# Patient Record
Sex: Male | Born: 1960 | Race: White | Hispanic: No | Marital: Single | Smoking: Current every day smoker
Health system: Southern US, Community
[De-identification: ages and names within clinical notes are randomized; demographics above are authoritative.]

## PROBLEM LIST (undated history)

## (undated) DIAGNOSIS — F1910 Other psychoactive substance abuse, uncomplicated: Secondary | ICD-10-CM

## (undated) DIAGNOSIS — Z5900 Homelessness unspecified: Secondary | ICD-10-CM

## (undated) DIAGNOSIS — F1020 Alcohol dependence, uncomplicated: Secondary | ICD-10-CM

## (undated) DIAGNOSIS — Z765 Malingerer [conscious simulation]: Secondary | ICD-10-CM

## (undated) DIAGNOSIS — F419 Anxiety disorder, unspecified: Secondary | ICD-10-CM

## (undated) DIAGNOSIS — F329 Major depressive disorder, single episode, unspecified: Secondary | ICD-10-CM

## (undated) DIAGNOSIS — G629 Polyneuropathy, unspecified: Secondary | ICD-10-CM

## (undated) DIAGNOSIS — F32A Depression, unspecified: Secondary | ICD-10-CM

## (undated) DIAGNOSIS — B1920 Unspecified viral hepatitis C without hepatic coma: Secondary | ICD-10-CM

## (undated) DIAGNOSIS — Z86718 Personal history of other venous thrombosis and embolism: Secondary | ICD-10-CM

## (undated) DIAGNOSIS — J449 Chronic obstructive pulmonary disease, unspecified: Secondary | ICD-10-CM

## (undated) HISTORY — PX: HIP SURGERY: SHX245

## (undated) HISTORY — PX: ABDOMINAL SURGERY: SHX537

---

## 2000-05-18 ENCOUNTER — Emergency Department (HOSPITAL_COMMUNITY): Admission: EM | Admit: 2000-05-18 | Discharge: 2000-05-18 | Payer: Self-pay | Admitting: Emergency Medicine

## 2006-09-29 ENCOUNTER — Emergency Department (HOSPITAL_COMMUNITY): Admission: EM | Admit: 2006-09-29 | Discharge: 2006-09-29 | Payer: Self-pay | Admitting: Emergency Medicine

## 2008-03-30 ENCOUNTER — Inpatient Hospital Stay (HOSPITAL_COMMUNITY): Admission: EM | Admit: 2008-03-30 | Discharge: 2008-04-06 | Payer: Self-pay | Admitting: Internal Medicine

## 2008-03-30 ENCOUNTER — Encounter: Payer: Self-pay | Admitting: Emergency Medicine

## 2008-05-18 ENCOUNTER — Emergency Department (HOSPITAL_BASED_OUTPATIENT_CLINIC_OR_DEPARTMENT_OTHER): Admission: EM | Admit: 2008-05-18 | Discharge: 2008-05-19 | Payer: Self-pay | Admitting: Emergency Medicine

## 2008-06-22 ENCOUNTER — Emergency Department (HOSPITAL_COMMUNITY): Admission: EM | Admit: 2008-06-22 | Discharge: 2008-06-22 | Payer: Self-pay | Admitting: Emergency Medicine

## 2008-09-01 IMAGING — CR DG HIP COMPLETE 2+V*R*
3 series · 3 of 3 positions shown · non-contrast
Comparison: None

CLINICAL DATA: Some onset right hip and knee pain

RIGHT HIP - COMPLETE 2+ VIEW

[t pelvis a.p.]
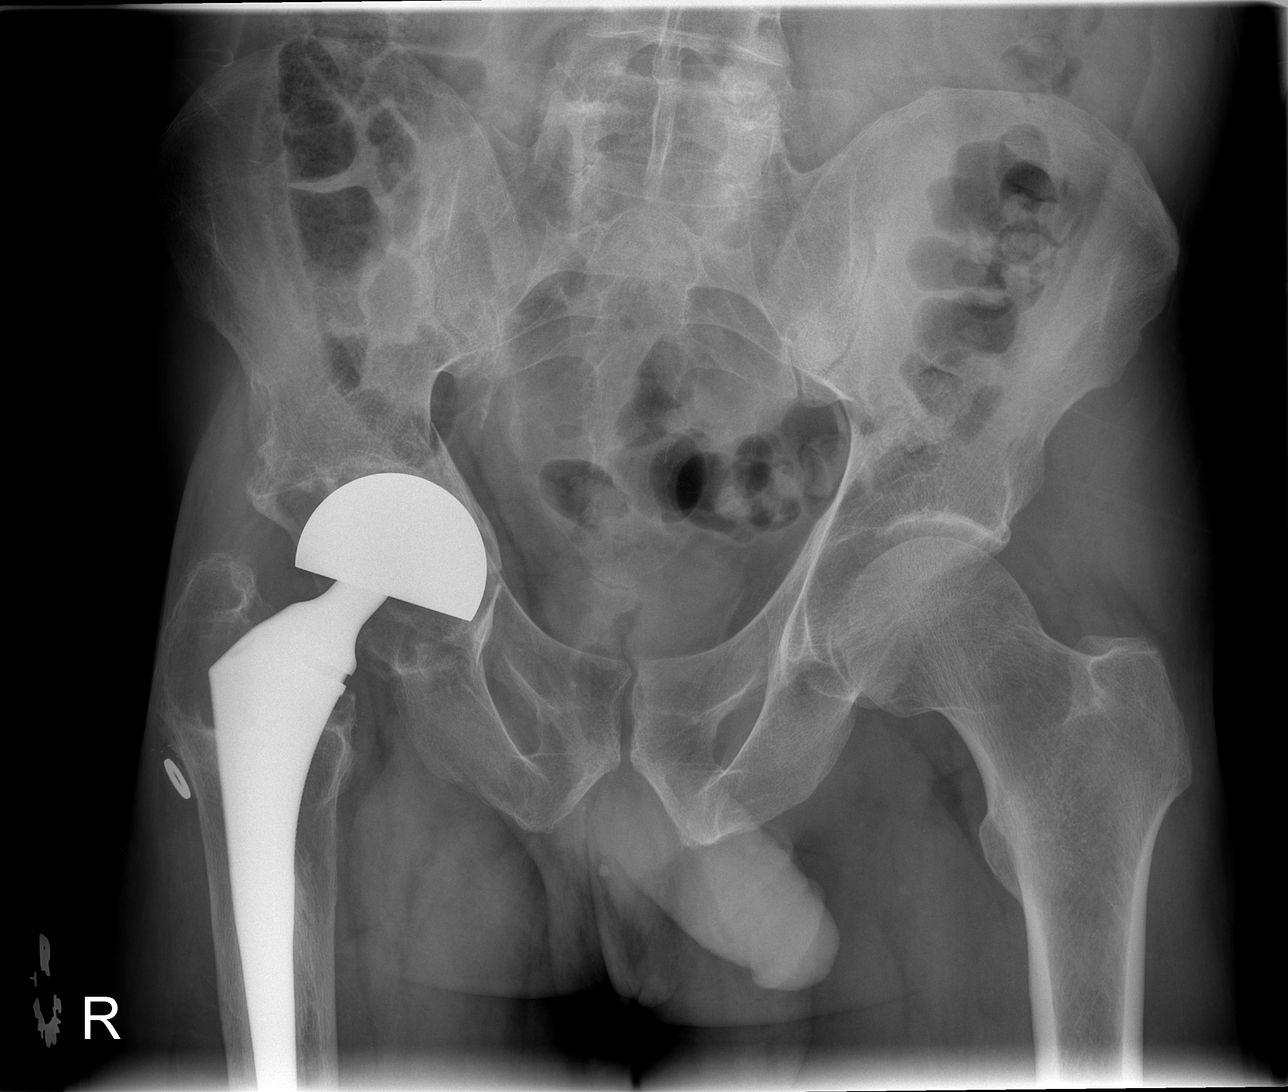

[t hip ap right]
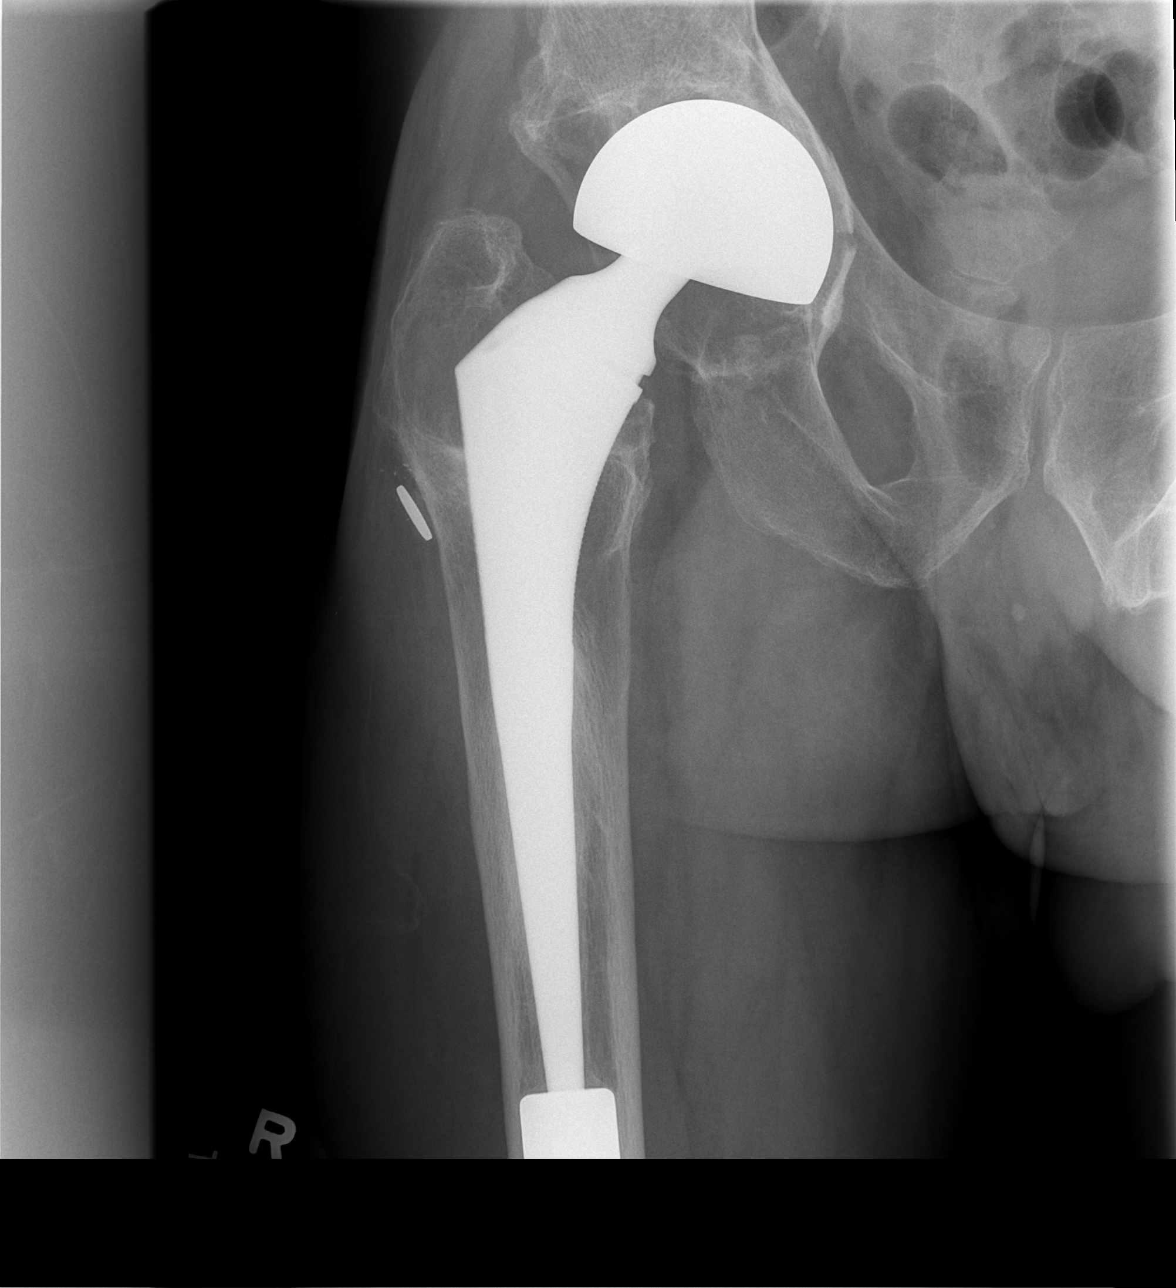

[t hip frog leg right]
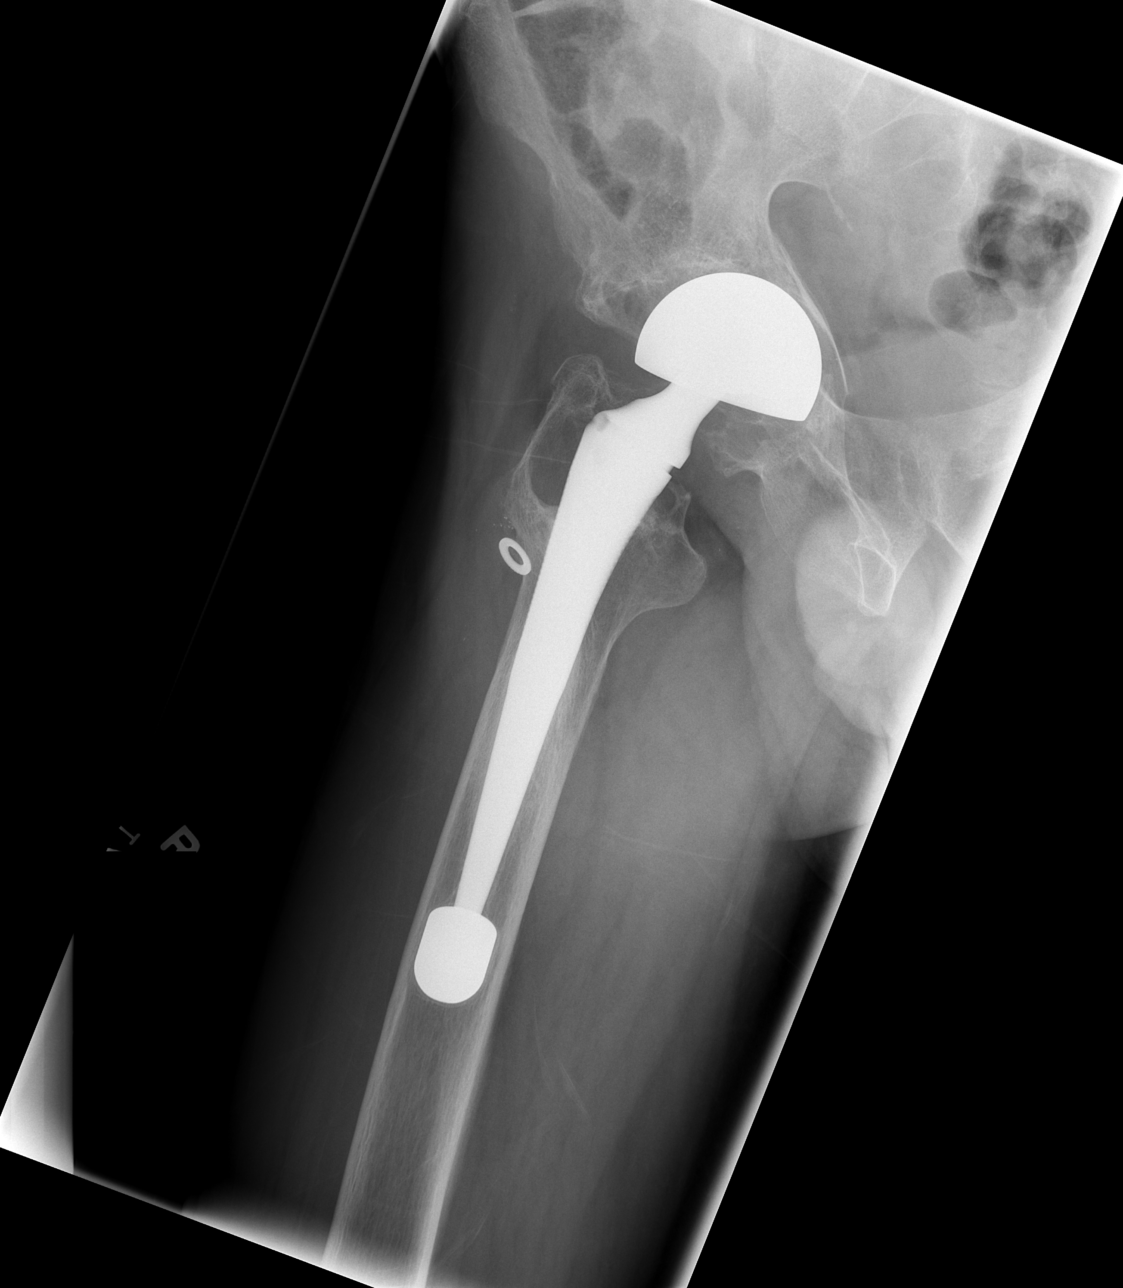

[3 of 3 positions shown; findings below may reference images not displayed]

FINDINGS: There is extensive lucency about the femoral component.
The features are consistent with a foreign body histiocytic
reaction.  Additionally, lucency is seen along the acetabular
component.  There is cortical destruction along the medial
acetabulum.

The remainder of the the bony pelvis is intact
IMPRESSION: 1.  Features of loosening of the right hip prosthesis are noted.
Changes associated with a foreign body histiocytic reaction are
identified.  There is cortical disruption along the medial
acetabular wall which is suspicious for fracture.

## 2011-01-30 NOTE — H&P (Signed)
[name], [name]                ACCOUNT NO.:  [id number]   MEDICAL RECORD NO.:  [id number]          PATIENT TYPE:  INP   LOCATION:  1440                         FACILITY:  [facility]   PHYSICIAN:  Della Goo, M.D. DATE OF BIRTH:  1960-12-21   DATE OF ADMISSION:  03/30/2008  DATE OF DISCHARGE:                              HISTORY & PHYSICAL   PRIMARY CARE PHYSICIAN:  Unassigned.   CHIEF COMPLAINT:  Pulmonary Embolism   HISTORY OF PRESENT ILLNESS:  This is a 50 year old male who was seen in  the emergency department at the [facility] site following being  hospitalized at [facility] for a DVT of the right lower extremity  and a pulmonary embolism where he also had an IVC filter placed one day  ago.  The patient reports leaving against medical advice, and he states  he left because he was not being treated well at [facility].  He reported to the [facility] emergency department site of [facility] for evaluation.  He also has a right knee fracture which was  suffered 2 weeks ago and has been in a right lower extremity knee  immobilizer.  The patient also has a long history of alcohol abuse and  reports wanting to quit drinking.  He reports drinking over a 12-pack of  beer daily for many years.  He reports after leaving [facility], he had a few beers.  When he was seen in the emergency  department in [facility], he was found to have an alcohol level of 25.  The patient also reports being homeless and stays around in the area of  [place], which is why he states he went to [facility].  He reports not having any medical records in the [facility] systems  prior to this.   PAST MEDICAL HISTORY:  Mentioned above.   MEDICATIONS:  None.   ALLERGIES:  NONSTEROIDAL ANTI-INFLAMMATORIES AND PROTONIX.   SOCIAL HISTORY:  The patient is a smoker.  He reports smoking one pack a  day since age 62.  He also is a drinker and the drinking  history as  mentioned above.  He denies any illicit drug usage.   FAMILY HISTORY:  Noncontributory.   REVIEW OF SYSTEMS:  Negative.   PHYSICAL EXAMINATION FINDINGS:  GENERAL:  This is a 50 year old thin,  well-developed male in no discomfort or acute distress.  VITAL SIGNS:  Stable.  He is afebrile.  HEENT:  Normocephalic, atraumatic.  There is no scleral icterus.  Pupils  are equally round reactive to light.  Extraocular muscles are intact.  Oropharynx clear.  NECK:  Supple full range of motion.  No thyromegaly, adenopathy, jugular  venous distention.  CARDIOVASCULAR:  Regular rate and rhythm.  No murmurs, gallops or rubs.  LUNGS:  Clear to auscultation bilaterally.  ABDOMEN:  Positive bowel sounds, soft, nontender, nondistended.  EXTREMITIES:  Without cyanosis, clubbing or edema.  There are  excoriations of the right lower extremity along the lateral aspect of  the right lower extremity.  There is no cyanosis, clubbing or edema  except at the patellar area.  Dorsal pedal pulses are intact  bilaterally.  NEUROLOGIC:  Examination nonfocal.   ASSESSMENT:  A 50 year old male being admitted with:  1. Pulmonary embolism and DVT of the right lower extremity.  2. Alcohol abuse.  3. Right knee fracture.  4. Tobacco abuse.   PLAN:  The patient will be admitted to telemetry area for cardiac  monitoring.  The Coumadin protocol will be initiated and full-dose  Lovenox therapy will be ordered.  The IV Ativan withdrawal protocol for  alcohol withdrawal has also been ordered, and a repeat x-ray of the  right lower extremity will be performed in the a.m.  The patient's  medical records will be requested from [facility].      Della Goo, M.D.  Electronically Signed     HJ/MEDQ  D:  03/31/2008  T:  03/31/2008  Job:  [id number]

## 2011-02-02 NOTE — Discharge Summary (Signed)
NAMEBRENNYN, [name]                ACCOUNT NO.:  [id number]   MEDICAL RECORD NO.:  [id number]          PATIENT TYPE:  INP   LOCATION:  1440                         FACILITY:  [facility]   PHYSICIAN:  Lonia Blood, M.D.      DATE OF BIRTH:  1961/03/07   DATE OF ADMISSION:  03/30/2008  DATE OF DISCHARGE:  04/06/2008                               DISCHARGE SUMMARY   PRIMARY CARE PHYSICIAN:  The patient was unassigned to [place] from [facility], in [facility], that is where he gets his care normally.   DISCHARGE DIAGNOSES:  1. Pulmonary embolus and left lower extremity deep vein thrombosis.  2. Right knee fracture.  3. Polysubstance abuse.  4. Status post inferior vena cava filter placement.   DISCHARGE MEDICATIONS:  1. Coumadin to be titrated as an outpatient.  2. Thiamine 100 mg daily.  3. Multivitamins daily.  4. Ibuprofen 500 mg t.i.d.  5. Abilify 20 mg daily.  6. Celexa 10 mg daily.  7. Trazodone 200 mg nightly.  8. Vistaril 25 mg daily.   DISPOSITION:  The patient was discharged with help to go home.  He is  unable to buy his medications and he received help from the hospital.  He has been advised to follow-up in with [facility] or return to [facility] where he originally gets his care.   PROCEDURES PERFORMED:  Right knee x-ray performed on March 30, 2008,  showed comminuted and mildly depressed lateral tibial plateau fracture,  joint effusions with suggestions of hemarthrosis.   CONSULTATIONS:  None.   BRIEF HISTORY AND PHYSICAL:  Please refer to dictated history and  physical on admission by Dr. Della Goo.  In short, however,  patient was a 50 year old gentleman that typically gets his care at [facility] as well as [facility].  He was hospitalized at [facility] for DVT of the  right lower extremity and pulmonary embolism.  An IVC filter was placed  a earlier and patient was still under treatment when he left against  medical advice.  He came to the hospital  here asking for a second  opinion.  The patient had a right knee fracture that was also under  treatment for 2 weeks prior to having his DVT and PE.  He was  subsequently admitted here for further management.   HOSPITAL COURSE:  1. PE, DVT.  The patient was started on heparin and Coumadin in      addition to his IVC filter.  He was counseled extensively and he      accepted treatment here with the same treatment plan.  He was      discharged when his INR was therapeutic between 2 and 3.  2. Right knee fracture.  The patient received PT, OT and orthopedics      was consulted here.  Main treatment is to continue what he is doing      with crutches and very little weightbearing at this point.  3. Alcohol abuse.  The patient was empirically started on alcohol      withdrawal protocol and he  did very well in the hospital.  4. Hypokalemia.  This was transient and treated accordingly.  5. Social issues.  The patient was homeless, apparently, and was sent      to the shelter with assistance both with medication as well as      transportation at the time of discharge.      Lonia Blood, M.D.  Electronically Signed     LG/MEDQ  D:  04/25/2008  T:  04/25/2008  Job:  [id number]

## 2011-05-27 ENCOUNTER — Other Ambulatory Visit: Payer: Self-pay

## 2011-05-27 ENCOUNTER — Encounter: Payer: Self-pay | Admitting: *Deleted

## 2011-05-27 ENCOUNTER — Emergency Department (HOSPITAL_BASED_OUTPATIENT_CLINIC_OR_DEPARTMENT_OTHER)
Admission: EM | Admit: 2011-05-27 | Discharge: 2011-05-28 | Disposition: A | Discharge status: Rehabilitation Facility | Payer: Self-pay | Attending: Emergency Medicine | Admitting: Emergency Medicine

## 2011-05-27 DIAGNOSIS — F17200 Nicotine dependence, unspecified, uncomplicated: Secondary | ICD-10-CM | POA: Insufficient documentation

## 2011-05-27 DIAGNOSIS — Z5900 Homelessness unspecified: Secondary | ICD-10-CM | POA: Insufficient documentation

## 2011-05-27 DIAGNOSIS — F1020 Alcohol dependence, uncomplicated: Secondary | ICD-10-CM

## 2011-05-27 DIAGNOSIS — F10239 Alcohol dependence with withdrawal, unspecified: Secondary | ICD-10-CM

## 2011-05-27 DIAGNOSIS — F319 Bipolar disorder, unspecified: Secondary | ICD-10-CM | POA: Insufficient documentation

## 2011-05-27 DIAGNOSIS — R110 Nausea: Secondary | ICD-10-CM | POA: Insufficient documentation

## 2011-05-27 DIAGNOSIS — F10939 Alcohol use, unspecified with withdrawal, unspecified: Secondary | ICD-10-CM | POA: Insufficient documentation

## 2011-05-27 HISTORY — DX: Alcohol dependence, uncomplicated: F10.20

## 2011-05-27 HISTORY — DX: Polyneuropathy, unspecified: G62.9

## 2011-05-27 LAB — COMPREHENSIVE METABOLIC PANEL
ALT: 16 U/L (ref 0–53)
Alkaline Phosphatase: 102 U/L (ref 39–117)
BUN: 8 mg/dL (ref 6–23)
CO2: 28 mEq/L (ref 19–32)
Chloride: 98 mEq/L (ref 96–112)
Creatinine, Ser: 0.5 mg/dL (ref 0.50–1.35)
GFR calc Af Amer: >60 mL/min (ref >60)
GFR calc non Af Amer: >60 mL/min (ref >60)
Glucose, Bld: 127 mg/dL — ABNORMAL HIGH (ref 70–99)
Sodium: 136 mEq/L (ref 135–145)
Total Bilirubin: 0.7 mg/dL (ref 0.3–1.2)

## 2011-05-27 LAB — CBC
HCT: 38 % — ABNORMAL LOW (ref 39.0–52.0)
Hemoglobin: 13.2 g/dL (ref 13.0–17.0)
MCH: 31.1 pg (ref 26.0–34.0)
MCHC: 34.7 g/dL (ref 30.0–36.0)
MCV: 89.4 fL (ref 78.0–100.0)
Platelets: 206 10*3/uL (ref 150–400)
RDW: 15.3 % (ref 11.5–15.5)
WBC: 7 10*3/uL (ref 4.0–10.5)

## 2011-05-27 LAB — RAPID URINE DRUG SCREEN, HOSP PERFORMED
Cocaine: NOT DETECTED
Tetrahydrocannabinol: POSITIVE — AB

## 2011-05-27 LAB — ETHANOL: Alcohol, Ethyl (B): <11 mg/dL (ref 0–11)

## 2011-05-27 MED ORDER — LORAZEPAM 2 MG/ML IJ SOLN: 1.0000 mg | Freq: Once | INTRAMUSCULAR | Status: DC

## 2011-05-27 MED ORDER — ONDANSETRON HCL 8 MG PO TABS
8.0000 mg | ORAL_TABLET | Freq: Once | ORAL | Status: AC
  Administered 2011-05-27: 8 mg via ORAL
  Filled 2011-05-27: qty 1

## 2011-05-27 MED ORDER — LORAZEPAM 1 MG PO TABS
2.0000 mg | ORAL_TABLET | Freq: Once | ORAL | Status: AC
  Administered 2011-05-27: 2 mg via ORAL
  Filled 2011-05-27: qty 2

## 2011-05-27 MED ORDER — SODIUM CHLORIDE 0.9 % IV BOLUS (SEPSIS)
1000.0000 mL | Freq: Once | INTRAVENOUS | Status: AC
  Administered 2011-05-27: 1000 mL via INTRAVENOUS

## 2011-05-27 MED ORDER — LORAZEPAM 2 MG/ML IJ SOLN
1.0000 mg | Freq: Once | INTRAMUSCULAR | Status: AC
  Administered 2011-05-27: 1 mg via INTRAVENOUS
  Filled 2011-05-27: qty 1

## 2011-05-27 MED ORDER — THIAMINE HCL 100 MG/ML IJ SOLN
Freq: Once | INTRAVENOUS | Status: DC
  Filled 2011-05-27: qty 1000

## 2011-05-27 NOTE — ED Notes (Addendum)
[facility] called, Asher Muir, will arrive at appox 1130pm

## 2011-05-27 NOTE — ED Notes (Signed)
Called [facility]

## 2011-05-27 NOTE — ED Provider Notes (Signed)
Scribed for Forbes Cellar, MD, the patient was seen in room MH08/MH08 . This chart was scribed by Ellie Lunch. This patient's care was started at 9:16 PM.   CSN: [id number] Arrival date & time: 05/27/2011  8:03 PM  Chief Complaint  Patient presents with  . Alcohol Problem   HPI [name] [name] is a 50 y.o. male who presents to the Emergency Department complaining of alcoholism. Pt reports a 40 year history of alcohol abuse. Pt drinks up to 18-20 beers a day and/or 2 pints of liquor. Pt reports last drink was yesterday at 3:00 PM. C/o HA, Nausea, diaphoresis and tremors. Denies SI/HI.  Says he has been hearing music that he does not believe is real since his last drink. Reports he has had seizures in the past from alcohol use. Received inpatient treatment at [facility] at [facility] 2-3 months ago. Reports it was unsuccessful. Pt says he is homeless at this time.   Past Medical History  Diagnosis Date  . Alcoholic   . Peripheral neuropathy   artificial right hip Bipolar disorder  Past Surgical History  Procedure Date  . Hip surgery    MEDICATIONS:  Previous Medications   No medications on file  Reports he has Rx for lexiprin and valium, but has not taken either.  ALLERGIES:  Allergies as of 05/27/2011 - Review Complete 05/27/2011  Allergen Reaction Noted  . Nsaids Nausea And Vomiting 05/27/2011  . Protonix Rash 05/27/2011      History reviewed. No pertinent family history.  History  Substance Use Topics  . Smoking status: Current Everyday Smoker  . Smokeless tobacco: Not on file  . Alcohol Use: 0.0 oz/week      Review of Systems .10 Systems reviewed and are negative except as noted in the HPI.   Physical Exam  BP 117/69  Pulse 89  Temp(Src) 98 F (36.7 C) (Oral)  Resp 16  Ht 6\' 1"  (1.854 m)  Wt 150 lb (68.04 kg)  BMI 19.79 kg/m2  SpO2 100%  Physical Exam  Nursing note and vitals reviewed. Constitutional: He is oriented to person, place, and time. He appears  well-developed and well-nourished.  HENT:  Head: Normocephalic and atraumatic.  Eyes: EOM are normal. No scleral icterus.  Neck: Neck supple.  Cardiovascular: Regular rhythm and normal heart sounds.        tachycardic  Pulmonary/Chest: Effort normal and breath sounds normal.  Abdominal: Soft. There is no tenderness.  Musculoskeletal: Normal range of motion.  Neurological: He is alert and oriented to person, place, and time. No cranial nerve deficit. Coordination normal.       tremor at rest Strength 5/5 all extremities No pronator drift No facial droop   Skin: Skin is warm and dry.  Psychiatric: He has a normal mood and affect.   Procedures  OTHER DATA REVIEWED: Nursing notes, vital signs, and past medical records reviewed.  DIAGNOSTIC STUDIES: Oxygen Saturation is 94% on room air, adequate by my interpretation.     Date: 05/27/2011  Rate: 110  Rhythm: sinus tachycardia  QRS Axis: normal  Intervals: normal  ST/T Wave abnormalities: normal  Conduction Disutrbances:none  Narrative Interpretation:   Old EKG Reviewed: changes noted   LABS / RADIOLOGY:  Results for orders placed during the hospital encounter of 05/27/11  CBC      Component Value Range   WBC 7.0  4.0 - 10.5 (K/uL)   RBC 4.25  4.22 - 5.81 (MIL/uL)   Hemoglobin 13.2  13.0 - 17.0 (g/dL)  HCT 38.0 (*) 39.0 - 52.0 (%)   MCV 89.4  78.0 - 100.0 (fL)   MCH 31.1  26.0 - 34.0 (pg)   MCHC 34.7  30.0 - 36.0 (g/dL)   RDW 16.1  09.6 - 04.5 (%)   Platelets 206  150 - 400 (K/uL)  COMPREHENSIVE METABOLIC PANEL      Component Value Range   Sodium 136  135 - 145 (mEq/L)   Potassium 3.6  3.5 - 5.1 (mEq/L)   Chloride 98  96 - 112 (mEq/L)   CO2 28  19 - 32 (mEq/L)   Glucose, Bld 127 (*) 70 - 99 (mg/dL)   BUN 8  6 - 23 (mg/dL)   Creatinine, Ser 4.09  0.50 - 1.35 (mg/dL)   Calcium 9.6  8.4 - 81.1 (mg/dL)   Total Protein 7.2  6.0 - 8.3 (g/dL)   Albumin 3.4 (*) 3.5 - 5.2 (g/dL)   AST 34  0 - 37 (U/L)   ALT 16  0 - 53  (U/L)   Alkaline Phosphatase 102  39 - 117 (U/L)   Total Bilirubin 0.7  0.3 - 1.2 (mg/dL)   GFR calc non Af Amer >60  >60 (mL/min)   GFR calc Af Amer >60  >60 (mL/min)  ETHANOL      Component Value Range   Alcohol, Ethyl (B) <11  0 - 11 (mg/dL)  ACETAMINOPHEN LEVEL      Component Value Range   Acetaminophen (Tylenol), Serum <15.0  10 - 30 (ug/mL)  URINE RAPID DRUG SCREEN (HOSP PERFORMED)      Component Value Range   Opiates NONE DETECTED  NONE DETECTED    Cocaine NONE DETECTED  NONE DETECTED    Benzodiazepines POSITIVE (*) NONE DETECTED    Amphetamines NONE DETECTED  NONE DETECTED    Tetrahydrocannabinol POSITIVE (*) NONE DETECTED    Barbiturates NONE DETECTED  NONE DETECTED     MDM: 50yoM h/o etoh abuse pw request for etoh detox. Withdrawing here. Check labs. IVF, ativan. Needs eval for etoh w/d   21:16 EDP at Pt bedside discusses treatment and orders. PT reports improvement with Ativan.   1:50 AM  D/W [facility]. Working on placement. Pt resting comfortably at this time.   BP 117/69  Pulse 89  Temp(Src) 98 F (36.7 C) (Oral)  Resp 16  Ht 6\' 1"  (1.854 m)  Wt 150 lb (68.04 kg)  BMI 19.79 kg/m2  SpO2 100%  Pt signed out to Dr. Denton Lank  IMPRESSION: Diagnoses that have been ruled out:  Diagnoses that are still under consideration:  Final diagnoses:    MEDICATIONS GIVEN IN THE E.D.  Medications  LORazepam (ATIVAN) tablet 2 mg (2 mg Oral Given 05/27/11 2053)  ondansetron (ZOFRAN) tablet 8 mg (8 mg Oral Given 05/27/11 2053)  sodium chloride 0.9 % bolus 1,000 mL (1000 mL Intravenous Given 05/27/11 2124)  LORazepam (ATIVAN) injection 1 mg (1 mg Intravenous Given 05/27/11 2130)   SCRIBE ATTESTATION: I personally performed the services described in this documentation, which was scribed in my presence. The recorded information has been reviewed and considered. Forbes Cellar, MD          Forbes Cellar, MD 05/28/11 [phone]

## 2011-05-27 NOTE — ED Notes (Signed)
Jamie with [facility] has arrived and is at the patient's bedside.

## 2011-05-27 NOTE — ED Notes (Signed)
Pt states he is an alcoholic and is trying to get help. Last drank yesterday at 3p. Also c/o H/A, nausea "kind of like I have the flu" Shaky

## 2011-05-28 MED ORDER — THIAMINE HCL 100 MG/ML IJ SOLN: 100.0000 mg | Freq: Every day | INTRAMUSCULAR | Status: DC

## 2011-05-28 MED ORDER — LORAZEPAM 2 MG/ML IJ SOLN
1.0000 mg | Freq: Four times a day (QID) | INTRAMUSCULAR | Status: DC | PRN
  Administered 2011-05-28: 1 mg via INTRAVENOUS
  Filled 2011-05-28: qty 1

## 2011-05-28 MED ORDER — THERA M PLUS PO TABS: 1.0000 | ORAL_TABLET | Freq: Every day | ORAL | Status: DC

## 2011-05-28 MED ORDER — LORAZEPAM 1 MG PO TABS: 1.0000 mg | ORAL_TABLET | Freq: Four times a day (QID) | ORAL | Status: DC | PRN

## 2011-05-28 MED ORDER — LORAZEPAM 1 MG PO TABS
1.0000 mg | ORAL_TABLET | Freq: Once | ORAL | Status: AC
  Administered 2011-05-28: 1 mg via ORAL
  Filled 2011-05-28: qty 1

## 2011-05-28 MED ORDER — VITAMIN B-1 100 MG PO TABS: 100.0000 mg | ORAL_TABLET | Freq: Every day | ORAL | Status: DC

## 2011-05-28 MED ORDER — FOLIC ACID 1 MG PO TABS: 1.0000 mg | ORAL_TABLET | Freq: Every day | ORAL | Status: DC

## 2011-05-28 NOTE — ED Notes (Addendum)
Per Asher Muir with [facility], pt has been accepted to [facility] in [place], provider notified and pt to be taken by [facility] in the morning about 0800am or shortly after.

## 2011-05-28 NOTE — ED Notes (Signed)
Pt d/c amb with mobile crisis worker, to be transported to [facility] in [place] for inpatient detox.  Pt with minimal tremors noted to hands, gait steady and strong, speech clear, denies any c/o.

## 2011-05-28 NOTE — ED Provider Notes (Signed)
  Physical Exam  BP 136/89  Pulse 91  Temp(Src) 97.4 F (36.3 C) (Oral)  Resp 16  Ht 6\' 1"  (1.854 m)  Wt 150 lb (68.04 kg)  BMI 19.79 kg/m2  SpO2 98%  Physical Exam  ED Course  Procedures  MDM  Pt signed out that was awaiting beh health placement.  Evaluating service indicates pt has been accepted to [facility] for treatment and that they will pick up pt later this morning and transfer there. Pt resting quietly, no shakes, no tachycardia. Will sign out to morning md to recheck prior to dispo.       Suzi Roots, MD 05/28/11 [phone]

## 2011-06-14 LAB — URINALYSIS, ROUTINE W REFLEX MICROSCOPIC
Bilirubin Urine: NEGATIVE
Glucose, UA: NEGATIVE
Hgb urine dipstick: NEGATIVE
Ketones, ur: NEGATIVE
Protein, ur: NEGATIVE
Urobilinogen, UA: 0.2

## 2011-06-14 LAB — ACETAMINOPHEN LEVEL: Acetaminophen (Tylenol), Serum: <10 — ABNORMAL LOW

## 2011-06-14 LAB — POCT TOXICOLOGY PANEL
Benzodiazepines: POSITIVE
Opiates: POSITIVE

## 2011-06-14 LAB — CBC
HCT: 42.7
Hemoglobin: 14.5
Platelets: 362
WBC: 9.7

## 2011-06-14 LAB — DIFFERENTIAL
Basophils Absolute: 0.2 — ABNORMAL HIGH
Basophils Relative: 2 — ABNORMAL HIGH
Eosinophils Absolute: 0
Monocytes Absolute: 0.4
Neutro Abs: 7.8 — ABNORMAL HIGH

## 2011-06-14 LAB — COMPREHENSIVE METABOLIC PANEL
ALT: 27
Albumin: 4.8
Alkaline Phosphatase: 93
BUN: 8
Chloride: 101
Glucose, Bld: 75
Potassium: 4.8
Sodium: 140
Total Bilirubin: 0.6

## 2011-06-14 LAB — ETHANOL: Alcohol, Ethyl (B): 20 — ABNORMAL HIGH

## 2011-06-15 LAB — HEPATIC FUNCTION PANEL
ALT: 29
Albumin: 3.1 — ABNORMAL LOW
Alkaline Phosphatase: 74
Indirect Bilirubin: 0.8
Total Protein: 6.2

## 2011-06-15 LAB — PROTIME-INR
INR: 1.1
INR: 1.6 — ABNORMAL HIGH
INR: 2.5 — ABNORMAL HIGH
INR: 2.5 — ABNORMAL HIGH
Prothrombin Time: 19.9 — ABNORMAL HIGH
Prothrombin Time: 29.7 — ABNORMAL HIGH
Prothrombin Time: 33.9 — ABNORMAL HIGH

## 2011-06-15 LAB — TSH: TSH: 1.796

## 2011-06-15 LAB — BASIC METABOLIC PANEL
BUN: 9
Calcium: 9
Creatinine, Ser: 0.8
GFR calc Af Amer: >60

## 2011-06-15 LAB — CBC
MCV: 92.5
Platelets: 286
RBC: 4.15 — ABNORMAL LOW
WBC: 5

## 2011-06-19 LAB — DIFFERENTIAL
Lymphocytes Relative: 17
Lymphs Abs: 1.5
Monocytes Relative: 5
Neutrophils Relative %: 77

## 2011-06-19 LAB — URINALYSIS, ROUTINE W REFLEX MICROSCOPIC
Glucose, UA: NEGATIVE
Specific Gravity, Urine: 1.025
Urobilinogen, UA: 1
pH: 5.5

## 2011-06-19 LAB — POCT I-STAT, CHEM 8
BUN: 8
Chloride: 103
Creatinine, Ser: 0.8
Glucose, Bld: 87
Potassium: 3.6
Sodium: 134 — ABNORMAL LOW

## 2011-06-19 LAB — GC/CHLAMYDIA PROBE AMP, URINE: Chlamydia, Swab/Urine, PCR: NEGATIVE

## 2011-06-19 LAB — RAPID URINE DRUG SCREEN, HOSP PERFORMED
Amphetamines: NOT DETECTED
Barbiturates: NOT DETECTED

## 2011-06-19 LAB — CBC
Platelets: 222
RBC: 4.2 — ABNORMAL LOW
WBC: 8.9

## 2011-06-19 LAB — URINE MICROSCOPIC-ADD ON

## 2011-06-19 LAB — ETHANOL: Alcohol, Ethyl (B): <5

## 2011-06-19 LAB — URINE CULTURE: Colony Count: NO GROWTH

## 2011-06-20 LAB — POCT TOXICOLOGY PANEL

## 2011-06-20 LAB — URINALYSIS, ROUTINE W REFLEX MICROSCOPIC
Hgb urine dipstick: NEGATIVE
Specific Gravity, Urine: 1.005
Urobilinogen, UA: 0.2

## 2011-07-29 ENCOUNTER — Emergency Department (HOSPITAL_COMMUNITY)
Admission: EM | Admit: 2011-07-29 | Discharge: 2011-07-30 | Disposition: A | Discharge status: Home / Self Care | Payer: Self-pay | Attending: Emergency Medicine | Admitting: Emergency Medicine

## 2011-07-29 ENCOUNTER — Encounter (HOSPITAL_COMMUNITY): Payer: Self-pay

## 2011-07-29 DIAGNOSIS — R259 Unspecified abnormal involuntary movements: Secondary | ICD-10-CM | POA: Insufficient documentation

## 2011-07-29 DIAGNOSIS — R5381 Other malaise: Secondary | ICD-10-CM | POA: Insufficient documentation

## 2011-07-29 DIAGNOSIS — F1010 Alcohol abuse, uncomplicated: Secondary | ICD-10-CM | POA: Insufficient documentation

## 2011-07-29 DIAGNOSIS — F411 Generalized anxiety disorder: Secondary | ICD-10-CM | POA: Insufficient documentation

## 2011-07-29 DIAGNOSIS — Z5900 Homelessness unspecified: Secondary | ICD-10-CM | POA: Insufficient documentation

## 2011-07-29 DIAGNOSIS — R5383 Other fatigue: Secondary | ICD-10-CM | POA: Insufficient documentation

## 2011-07-29 DIAGNOSIS — IMO0001 Reserved for inherently not codable concepts without codable children: Secondary | ICD-10-CM | POA: Insufficient documentation

## 2011-07-29 DIAGNOSIS — F329 Major depressive disorder, single episode, unspecified: Secondary | ICD-10-CM | POA: Insufficient documentation

## 2011-07-29 DIAGNOSIS — M549 Dorsalgia, unspecified: Secondary | ICD-10-CM | POA: Insufficient documentation

## 2011-07-29 DIAGNOSIS — F3289 Other specified depressive episodes: Secondary | ICD-10-CM | POA: Insufficient documentation

## 2011-07-29 LAB — RAPID URINE DRUG SCREEN, HOSP PERFORMED: Opiates: NOT DETECTED

## 2011-07-29 LAB — COMPREHENSIVE METABOLIC PANEL
BUN: 12 mg/dL (ref 6–23)
CO2: 25 mEq/L (ref 19–32)
Chloride: 98 mEq/L (ref 96–112)
Creatinine, Ser: 0.6 mg/dL (ref 0.50–1.35)
GFR calc non Af Amer: >90 mL/min (ref >90)
Glucose, Bld: 90 mg/dL (ref 70–99)
Total Bilirubin: 0.3 mg/dL (ref 0.3–1.2)

## 2011-07-29 LAB — ETHANOL: Alcohol, Ethyl (B): <11 mg/dL (ref 0–11)

## 2011-07-29 LAB — CBC
HCT: 39.9 % (ref 39.0–52.0)
Hemoglobin: 13.2 g/dL (ref 13.0–17.0)
MCV: 96.4 fL (ref 78.0–100.0)
RBC: 4.14 MIL/uL — ABNORMAL LOW (ref 4.22–5.81)
WBC: 8.8 10*3/uL (ref 4.0–10.5)

## 2011-07-29 MED ORDER — LORAZEPAM 1 MG PO TABS
1.0000 mg | ORAL_TABLET | Freq: Four times a day (QID) | ORAL | Status: DC | PRN
  Administered 2011-07-30: 2 mg via ORAL
  Administered 2011-07-30: 1 mg via ORAL
  Filled 2011-07-29: qty 2
  Filled 2011-07-29: qty 1

## 2011-07-29 MED ORDER — THERA M PLUS PO TABS
1.0000 | ORAL_TABLET | Freq: Every day | ORAL | Status: DC
  Administered 2011-07-30: 10:00:00 via ORAL
  Filled 2011-07-29: qty 1

## 2011-07-29 MED ORDER — THIAMINE HCL 100 MG/ML IJ SOLN: 100.0000 mg | Freq: Every day | INTRAMUSCULAR | Status: DC

## 2011-07-29 MED ORDER — FOLIC ACID 1 MG PO TABS
1.0000 mg | ORAL_TABLET | Freq: Every day | ORAL | Status: DC
  Administered 2011-07-30: 1 mg via ORAL
  Filled 2011-07-29: qty 1

## 2011-07-29 MED ORDER — LORAZEPAM 2 MG/ML IJ SOLN: 1.0000 mg | Freq: Four times a day (QID) | INTRAMUSCULAR | Status: DC | PRN

## 2011-07-29 MED ORDER — ACETAMINOPHEN 325 MG PO TABS
650.0000 mg | ORAL_TABLET | ORAL | Status: DC | PRN
  Administered 2011-07-30: 650 mg via ORAL
  Filled 2011-07-29: qty 2

## 2011-07-29 MED ORDER — LORAZEPAM 1 MG PO TABS
2.0000 mg | ORAL_TABLET | Freq: Three times a day (TID) | ORAL | Status: DC | PRN
  Administered 2011-07-29 (×2): 2 mg via ORAL
  Filled 2011-07-29 (×2): qty 2

## 2011-07-29 MED ORDER — ONDANSETRON HCL 4 MG PO TABS: 4.0000 mg | ORAL_TABLET | Freq: Three times a day (TID) | ORAL | Status: DC | PRN

## 2011-07-29 MED ORDER — ZOLPIDEM TARTRATE 5 MG PO TABS: 5.0000 mg | ORAL_TABLET | Freq: Every evening | ORAL | Status: DC | PRN

## 2011-07-29 MED ORDER — NICOTINE 21 MG/24HR TD PT24
21.0000 mg | MEDICATED_PATCH | Freq: Every day | TRANSDERMAL | Status: DC
  Administered 2011-07-29 – 2011-07-30 (×2): 21 mg via TRANSDERMAL
  Filled 2011-07-29 (×2): qty 1

## 2011-07-29 MED ORDER — VITAMIN B-1 100 MG PO TABS
100.0000 mg | ORAL_TABLET | Freq: Every day | ORAL | Status: DC
  Administered 2011-07-30: 100 mg via ORAL
  Filled 2011-07-29: qty 1

## 2011-07-29 NOTE — ED Notes (Signed)
Pt admitted to psych ED requesting detox. States he drank two fifths of liquor yesterday and a pint of beer. He denies SI/HI or A/V H. Appearance is disheveled. Has tremors to extremities. Will call telepsych per MD order.

## 2011-07-29 NOTE — ED Notes (Signed)
Report called to wendy states pt can go to room 36

## 2011-07-29 NOTE — ED Provider Notes (Signed)
Patient requesting help with alcohol he denies suicidal ideation presently he is homeless exam cooperative tremulous [place] coma score 15  Doug Sou, MD 07/29/11 2126

## 2011-07-29 NOTE — ED Provider Notes (Signed)
History     CSN: [id number] Arrival date & time: 07/29/2011  3:41 PM   First MD Initiated Contact with Patient 07/29/11 1658      Chief Complaint  Patient presents with  . V70.1    pt in by ems with etoh and s/i with a plan to jump in fromt of car     (Consider location/radiation/quality/duration/timing/severity/associated sxs/prior treatment) Patient is a 50 y.o. male presenting with alcohol problem. The history is provided by the patient.  Alcohol Problem This is a chronic problem. The current episode started more than 1 year ago. The problem occurs constantly. The problem has been gradually worsening. Associated symptoms include joint swelling, myalgias and weakness. Pertinent negatives include no abdominal pain, chest pain, chills, fever, nausea, rash or vomiting.  Pt reports alcohol problem, drinks daily, as much as he can. One week ago got out of detox but relapsed. States today had thoughts about hurting himself, states thought about jumping off the bridge. States he is homeless, has chronic pain, and his life is "out of control." States has history of DTs. Last drink yesterday.  Past Medical History  Diagnosis Date  . Alcoholic   . Peripheral neuropathy     Past Surgical History  Procedure Date  . Hip surgery     History reviewed. No pertinent family history.  History  Substance Use Topics  . Smoking status: Current Everyday Smoker  . Smokeless tobacco: Current User  . Alcohol Use: 0.0 oz/week      Review of Systems  Constitutional: Negative for fever and chills.  HENT: Negative.   Respiratory: Negative.   Cardiovascular: Negative for chest pain.  Gastrointestinal: Negative for nausea, vomiting and abdominal pain.  Genitourinary: Negative.   Musculoskeletal: Positive for myalgias, back pain and joint swelling.  Skin: Negative.  Negative for rash.  Neurological: Positive for weakness.  Psychiatric/Behavioral: Positive for suicidal ideas.       Depression      Allergies  Nsaids and Protonix  Home Medications   Current Outpatient Rx  Name Route Sig Dispense Refill  . DIVALPROEX SODIUM 500 MG PO TB24 Oral Take 500 mg by mouth daily.      Marland Kitchen GABAPENTIN 100 MG PO CAPS Oral Take 1,400 mg by mouth daily.        BP 135/84  Pulse 89  Temp(Src) 98.4 F (36.9 C) (Oral)  Resp 18  SpO2 99%  Physical Exam  Constitutional: He is oriented to person, place, and time. Vital signs are normal. He appears well-developed and well-nourished.       desheveled  Eyes: Conjunctivae are normal. Pupils are equal, round, and reactive to light.  Neck: Neck supple.  Cardiovascular: Normal rate, regular rhythm and normal heart sounds.   Pulmonary/Chest: Effort normal and breath sounds normal. No respiratory distress.  Abdominal: Soft. Bowel sounds are normal. There is no tenderness.  Musculoskeletal: Normal range of motion.  Neurological: He is alert and oriented to person, place, and time. He has normal reflexes. Coordination normal.       Resting tremmor  Skin: Skin is warm and dry.  Psychiatric:       Anxious, flat affect, depressed, suicidal ideations    ED Course  Procedures (including critical care time)  Pt medically cleared.   Results for orders placed during the hospital encounter of 07/29/11  CBC      Component Value Range   WBC 8.8  4.0 - 10.5 (K/uL)   RBC 4.14 (*) 4.22 - 5.81 (MIL/uL)  Hemoglobin 13.2  13.0 - 17.0 (g/dL)   HCT 16.1  09.6 - 04.5 (%)   MCV 96.4  78.0 - 100.0 (fL)   MCH 31.9  26.0 - 34.0 (pg)   MCHC 33.1  30.0 - 36.0 (g/dL)   RDW 40.9  81.1 - 91.4 (%)   Platelets 247  150 - 400 (K/uL)  COMPREHENSIVE METABOLIC PANEL      Component Value Range   Sodium 135  135 - 145 (mEq/L)   Potassium 3.5  3.5 - 5.1 (mEq/L)   Chloride 98  96 - 112 (mEq/L)   CO2 25  19 - 32 (mEq/L)   Glucose, Bld 90  70 - 99 (mg/dL)   BUN 12  6 - 23 (mg/dL)   Creatinine, Ser 7.82  0.50 - 1.35 (mg/dL)   Calcium 9.2  8.4 - 95.6 (mg/dL)   Total  Protein 7.5  6.0 - 8.3 (g/dL)   Albumin 3.7  3.5 - 5.2 (g/dL)   AST 28  0 - 37 (U/L)   ALT 18  0 - 53 (U/L)   Alkaline Phosphatase 104  39 - 117 (U/L)   Total Bilirubin 0.3  0.3 - 1.2 (mg/dL)   GFR calc non Af Amer >90  >90 (mL/min)   GFR calc Af Amer >90  >90 (mL/min)  ETHANOL      Component Value Range   Alcohol, Ethyl (B) <11  0 - 11 (mg/dL)  URINE RAPID DRUG SCREEN (HOSP PERFORMED)      Component Value Range   Opiates NONE DETECTED  NONE DETECTED    Cocaine NONE DETECTED  NONE DETECTED    Benzodiazepines NONE DETECTED  NONE DETECTED    Amphetamines NONE DETECTED  NONE DETECTED    Tetrahydrocannabinol POSITIVE (*) NONE DETECTED    Barbiturates POSITIVE (*) NONE DETECTED     Spoke with [facility]. Will come see pt. Pt is suicidal. Plan to jump off the bridge. Placed on CIWA protocol.     MDM          Lottie Mussel, PA 07/30/11 [phone]

## 2011-07-30 NOTE — BH Assessment (Addendum)
Assessment Note   [name] [name] is an 50 y.o. male. Pt requests detox/treatment for alcohol abuse.  Pt reports he is drinking 2 pints of vodka along with 12 beers daily for past 3-4 months.  Pt also reports daily marijuana use.  Pt does report withdrawal symptoms as well as a history of DTs.  Currently reports sweats and shakes.  Pt reports depression and states he had suicidal thoughts while crossing a bridge yesterday.  Pt denies current SI, states he wants help for alcohol and is safe from self harm at this time.  Pt reports stressors of no job, finances, and homelessness.  Also reports his brother and father died in 27-Jan-2010.  Axis I: Alcohol Abuse and Depressive Disorder NOS Axis II: Deferred Axis III:  Past Medical History  Diagnosis Date  . Alcoholic   . Peripheral neuropathy    Axis IV: economic problems and housing problems Axis V: 41-50 serious symptoms  Past Medical History:  Past Medical History  Diagnosis Date  . Alcoholic   . Peripheral neuropathy     Past Surgical History  Procedure Date  . Hip surgery     Family History: History reviewed. No pertinent family history.  Social History:  reports that he has been smoking.  He uses smokeless tobacco. He reports that he drinks alcohol. He reports that he uses illicit drugs.  Allergies:  Allergies  Allergen Reactions  . Nsaids Nausea And Vomiting  . Protonix Rash    Home Medications:  Medications Prior to Admission  Medication Dose Route Frequency Provider Last Rate Last Dose  . acetaminophen (TYLENOL) tablet 650 mg  650 mg Oral Q4H PRN Tatyana A Kirichenko, PA      . folic acid (FOLVITE) tablet 1 mg  1 mg Oral Daily Tatyana A Kirichenko, PA      . LORazepam (ATIVAN) tablet 1 mg  1 mg Oral Q6H PRN Tatyana A Kirichenko, PA   1 mg at 07/30/11 0816   Or  . LORazepam (ATIVAN) injection 1 mg  1 mg Intravenous Q6H PRN Tatyana A Kirichenko, PA      . multivitamins ther. w/minerals tablet 1 tablet  1 tablet Oral Daily  Tatyana A Kirichenko, PA      . nicotine (NICODERM CQ - dosed in mg/24 hours) patch 21 mg  21 mg Transdermal Daily Tatyana A Kirichenko, PA   21 mg at 07/29/11 1802  . ondansetron (ZOFRAN) tablet 4 mg  4 mg Oral Q8H PRN Tatyana A Kirichenko, PA      . thiamine (VITAMIN B-1) tablet 100 mg  100 mg Oral Daily Tatyana A Kirichenko, PA       Or  . thiamine (B-1) injection 100 mg  100 mg Intravenous Daily Tatyana A Kirichenko, PA      . zolpidem (AMBIEN) tablet 5 mg  5 mg Oral QHS PRN Tatyana A Kirichenko, PA      . DISCONTD: LORazepam (ATIVAN) tablet 2 mg  2 mg Oral Q8H PRN Tatyana A Kirichenko, PA   2 mg at 07/29/11 01-28-2123   No current outpatient prescriptions on file as of 07/29/2011.    OB/GYN Status:  No LMP for male patient.  General Assessment Data Assessment Number: 1  Living Arrangements: Homeless Can pt return to current living arrangement?: Yes  Risk to self Suicidal Ideation: No-Not Currently/Within Last 6 Months Suicidal Intent: No Is patient at risk for suicide?: No Suicidal Plan?: No Access to Means: No What has been your use of drugs/alcohol within  the last 12 months?: heavy use Other Self Harm Risks: na Triggers for Past Attempts: Other (Comment) (na) Intentional Self Injurious Behavior: None Family Suicide History: No Recent stressful life event(s): Job Loss;Financial Problems;Loss (Comment) (father and brother died 2010/02/11) Depression Symptoms: Insomnia;Tearfulness;Isolating;Fatigue;Guilt;Loss of interest in usual pleasures;Feeling worthless/self pity;Feeling angry/irritable Substance abuse history and/or treatment for substance abuse?: Yes Suicide prevention information given to non-admitted patients: Not applicable  Risk to Others Homicidal Ideation: No Thoughts of Harm to Others: No Current Homicidal Intent: No Current Homicidal Plan: No Access to Homicidal Means: No Identified Victim: na History of harm to others?: No Assessment of Violence: None Noted Violent  Behavior Description: na Does patient have access to weapons?: No Criminal Charges Pending?: Yes Describe Pending Criminal Charges: larceny x 2 Does patient have a court date: Yes  Mental Status Report Appear/Hygiene: Disheveled Eye Contact: Good Motor Activity: Unremarkable Speech: Logical/coherent Level of Consciousness: Alert Mood: Sad;Worthless, low self-esteem Affect: Appropriate to circumstance Anxiety Level: Minimal Judgement: Unimpaired Orientation: Person;Place;Time;Situation Obsessive Compulsive Thoughts/Behaviors: None  Cognitive Functioning Memory: Recent Intact;Remote Intact IQ: Average Insight: Fair Impulse Control: Fair Appetite: Fair Weight Loss: 70  Weight Gain: 0  Sleep: Decreased Total Hours of Sleep: 3  Vegetative Symptoms: None  Prior Inpatient/Outpatient Therapy Prior Therapy: Inpatient (also [facility] early 1990s.) Prior Therapy Dates: 02/11/01 Prior Therapy Facilty/Provider(s): [facility] x 2 Reason for Treatment: alcohol          Abuse/Neglect Assessment (Assessment to be complete while patient is alone) Physical Abuse: Denies Verbal Abuse: Denies Sexual Abuse: Denies Values / Beliefs Cultural Requests During Hospitalization: None Spiritual Requests During Hospitalization: None        Additional Information 1:1 In Past 12 Months?: No CIRT Risk: No Elopement Risk: No Does patient have medical clearance?: Yes     Disposition: PT was referred to [facility], who declined pt.  Pt then referred to [facility] in [place].  In process of getting authorization from [facility], it was reported by [facility] that pt was just detoxed and discharged from [facility] on 07/27/11.  Discussed this with pt and told him that further detox not needed.  Pt was set up with follow up by  Roma Kayser at [facility] and pt will still pursue this as he reports desire to enter long term alcohol treatment.  Pt requested transportation back to [facility] and social work provided  pt with bus pass.  Pt was asked about any SI at this tim and denies any SI currently.  Discussed pt with Dr Jeraldine Loots, who agreed with this plan.  Pt discharged.  Suicide prevention info give.  Daleen Squibb, LCSWA Disposition Disposition of Patient: Inpatient treatment program Type of inpatient treatment program: Adult  On Site Evaluation by:   Reviewed with Physician:     Lorri Frederick 07/30/2011 8:49 AM

## 2011-07-30 NOTE — ED Notes (Signed)
Per pt . Request, given drink. 

## 2011-07-30 NOTE — ED Notes (Signed)
Assumed care of pt

## 2011-07-30 NOTE — ED Provider Notes (Addendum)
9:56 AM The patient is in blankets, lying in his bed. He notes that he ate some breakfast, and generally feels okay. He requests medication for foot pain.  Gerhard Munch, MD 07/30/11 0957  2:48 PM On chart review, and during placement attempts, it was noted that the patient was discharged 2 days ago from another facility, a five-day inpatient stay. The duration of that stay, with the absence of alcohol use during it makes this presentation very unlikely to be constipated by withdrawal. The patient notes that he would like social work resources, and would like to be discharged. He denies any current suicidal ideation, and will be sent home.  Gerhard Munch, MD 07/30/11 [phone]

## 2011-07-30 NOTE — ED Notes (Signed)
Pt. Ambulated out of psych ed with [facility] security, given 2 bags of belongings, changed into clothes, given all d/c papers, signed d/c, given d/c charge paper to give to d/c window.

## 2011-07-30 NOTE — Progress Notes (Signed)
CSW spoke with patient who stated he wants to get back to [facility].  CSW will provide patient with [place] bus pass and bus pass and schedule for [facility] [place].  Patient did not express any SI/HI/AVH and is safe to be discharge. CSW will continue to provide support as needed.  Ileene Hutchinson , MSW, LCSWA 07/30/2011 2:30 PM

## 2011-07-31 ENCOUNTER — Emergency Department (HOSPITAL_COMMUNITY)
Admission: EM | Admit: 2011-07-31 | Discharge: 2011-07-31 | Disposition: A | Discharge status: Home / Self Care | Payer: Self-pay | Attending: Emergency Medicine | Admitting: Emergency Medicine

## 2011-07-31 ENCOUNTER — Encounter (HOSPITAL_COMMUNITY): Payer: Self-pay | Admitting: *Deleted

## 2011-07-31 DIAGNOSIS — F1910 Other psychoactive substance abuse, uncomplicated: Secondary | ICD-10-CM | POA: Insufficient documentation

## 2011-07-31 DIAGNOSIS — F17200 Nicotine dependence, unspecified, uncomplicated: Secondary | ICD-10-CM | POA: Insufficient documentation

## 2011-07-31 LAB — CBC
HCT: 39.4 % (ref 39.0–52.0)
MCH: 32 pg (ref 26.0–34.0)
MCHC: 33 g/dL (ref 30.0–36.0)
MCV: 97 fL (ref 78.0–100.0)
RDW: 14.9 % (ref 11.5–15.5)

## 2011-07-31 LAB — COMPREHENSIVE METABOLIC PANEL
Albumin: 3.4 g/dL — ABNORMAL LOW (ref 3.5–5.2)
BUN: 7 mg/dL (ref 6–23)
Calcium: 8.4 mg/dL (ref 8.4–10.5)
Creatinine, Ser: 0.67 mg/dL (ref 0.50–1.35)
Total Bilirubin: 0.1 mg/dL — ABNORMAL LOW (ref 0.3–1.2)
Total Protein: 7.3 g/dL (ref 6.0–8.3)

## 2011-07-31 LAB — RAPID URINE DRUG SCREEN, HOSP PERFORMED
Amphetamines: NOT DETECTED
Benzodiazepines: NOT DETECTED
Opiates: NOT DETECTED

## 2011-07-31 LAB — ETHANOL: Alcohol, Ethyl (B): 257 mg/dL — ABNORMAL HIGH (ref 0–11)

## 2011-07-31 MED ORDER — ACETAMINOPHEN 325 MG PO TABS
650.0000 mg | ORAL_TABLET | Freq: Once | ORAL | Status: DC
  Filled 2011-07-31: qty 2

## 2011-07-31 MED ORDER — ZIPRASIDONE MESYLATE 20 MG IM SOLR
20.0000 mg | Freq: Once | INTRAMUSCULAR | Status: AC
  Administered 2011-07-31: 20 mg via INTRAMUSCULAR

## 2011-07-31 MED ORDER — ZIPRASIDONE MESYLATE 20 MG IM SOLR
INTRAMUSCULAR | Status: AC
  Administered 2011-07-31: 20 mg via INTRAMUSCULAR
  Filled 2011-07-31: qty 20

## 2011-07-31 NOTE — ED Notes (Signed)
Pt in stating he wants to kill everyone in the hospital, denies ETOH or drug use, pt denies SI just HI, when asked why he said he wanted to kill everyone pt states "I said it because I can, not because I can say it but because I can do it", pt inappropriate with statements and aggressive in triage

## 2011-07-31 NOTE — ED Notes (Signed)
PT is aware of the need for a urine sample. Urinal is at bedside

## 2011-07-31 NOTE — ED Notes (Signed)
Report received from Alberteen Sam, RN - pt noted to be ambulating in hallway - insisting on obtaining his belongings.  Pt redirected to exam room by RN w/security.  Pt had object in his pocket that he says goes to a cigarette.  Pt gave item to security.  Sitter at bedside.

## 2011-07-31 NOTE — ED Provider Notes (Addendum)
History     CSN: [id number] Arrival date & time: 07/31/2011  2:41 AM   First MD Initiated Contact with Patient 07/31/11 0345      Chief Complaint  Patient presents with  . Medical Clearance    (Consider location/radiation/quality/duration/timing/severity/associated sxs/prior treatment) The history is provided by the patient.    Past Medical History  Diagnosis Date  . Alcoholic   . Peripheral neuropathy     Past Surgical History  Procedure Date  . Hip surgery     History reviewed. No pertinent family history.  History  Substance Use Topics  . Smoking status: Current Everyday Smoker  . Smokeless tobacco: Current User  . Alcohol Use: 0.0 oz/week      Review of Systems  Constitutional: Negative for fever, chills and activity change.  Skin: Negative for rash.  Psychiatric/Behavioral: Negative for suicidal ideas and hallucinations.    Allergies  Nsaids and Protonix  Home Medications   Current Outpatient Rx  Name Route Sig Dispense Refill  . DIVALPROEX SODIUM 500 MG PO TB24 Oral Take 500 mg by mouth daily.     Marland Kitchen GABAPENTIN 100 MG PO CAPS Oral Take 1,400 mg by mouth daily.        BP 101/70  Pulse 110  Temp(Src) 98.9 F (37.2 C) (Oral)  Resp 20  SpO2 94%  Physical Exam  Constitutional: He is oriented to person, place, and time. He appears well-developed and well-nourished.  HENT:  Head: Normocephalic.  Pulmonary/Chest: Effort normal.  Neurological: He is oriented to person, place, and time.  Skin: Skin is warm.    ED Course  Procedures (including critical care time)  Labs Reviewed  CBC - Abnormal; Notable for the following:    RBC 4.06 (*)    All other components within normal limits  COMPREHENSIVE METABOLIC PANEL - Abnormal; Notable for the following:    Albumin 3.4 (*)    Total Bilirubin 0.1 (*)    All other components within normal limits  ETHANOL - Abnormal; Notable for the following:    Alcohol, Ethyl (B) 257 (*)    All other  components within normal limits  URINE RAPID DRUG SCREEN (HOSP PERFORMED)   No results found.   No diagnosis found.    MDM  Detox         Arman Filter, NP 07/31/11 3086  Arman Filter, NP 07/31/11 5784  Arman Filter, NP 10/04/11 [phone]

## 2011-07-31 NOTE — ED Provider Notes (Signed)
Medical screening examination/treatment/procedure(s) were conducted as a shared visit with non-physician practitioner(s) and myself.  I personally evaluated the patient during the encounter Devoria Albe, MD, Franz Dell, MD 07/31/11 [phone]

## 2011-07-31 NOTE — ED Notes (Signed)
Pt given belongings and is getting dressed.  [employer] and security to assist pt.

## 2011-07-31 NOTE — ED Notes (Signed)
Security at bedside to wand and search patient, pt changed into scrubs

## 2011-07-31 NOTE — ED Notes (Signed)
Patient more cooperative at this time. Requesting to have handcuffs removed. Manus Rudd, FNP at bedside. Patient informed that [employer] officer could remove one handcuff and that if patient remained cooperative, the other cuff would be removed; patient verbalized understanding.

## 2011-07-31 NOTE — ED Notes (Signed)
Two bags of belongings locked in cabinet in patient's room.

## 2011-07-31 NOTE — ED Provider Notes (Signed)
Pt presented last night stating he was going to "kill everyone". He had to have physical and chemical restraints. Pt slept the rest of the night, he is not up, ambulatory, stating he wants to leave.   Medical screening examination/treatment/procedure(s) were conducted as a shared visit with non-physician practitioner(s) and myself.  I personally evaluated the patient during the encounter Devoria Albe, MD, Franz Dell, MD 07/31/11 [phone]

## 2011-07-31 NOTE — ED Notes (Signed)
Pt denies SI/HI.  States just wants to leave so can see his friends and get some beer.

## 2011-07-31 NOTE — ED Notes (Signed)
Patient c/o arm pain. Tylenol offered to patient but patient refused medication. States "I don't want it, I'm good."

## 2011-08-02 NOTE — ED Provider Notes (Signed)
Medical screening examination/treatment/procedure(s) were performed by non-physician practitioner and as supervising physician I was immediately available for consultation/collaboration.   Kenji Mapel, MD 08/02/11 1103 

## 2011-10-05 NOTE — ED Provider Notes (Signed)
Medical screening examination/treatment/procedure(s) were performed by non-physician practitioner and as supervising physician I was immediately available for consultation/collaboration. Radie Berges, MD, FACEP   Gordy Goar L Melvin Marmo, MD 10/05/11 1858 

## 2011-10-07 DIAGNOSIS — F1910 Other psychoactive substance abuse, uncomplicated: Secondary | ICD-10-CM | POA: Insufficient documentation

## 2011-10-07 DIAGNOSIS — F1010 Alcohol abuse, uncomplicated: Secondary | ICD-10-CM | POA: Insufficient documentation

## 2013-02-02 ENCOUNTER — Ambulatory Visit: Payer: Self-pay | Admitting: Thoracic Surgery (Cardiothoracic Vascular Surgery)

## 2013-02-28 DIAGNOSIS — E872 Acidosis: Secondary | ICD-10-CM | POA: Insufficient documentation

## 2013-03-04 DIAGNOSIS — T6701XA Heatstroke and sunstroke, initial encounter: Secondary | ICD-10-CM | POA: Insufficient documentation

## 2013-05-07 DIAGNOSIS — F1220 Cannabis dependence, uncomplicated: Secondary | ICD-10-CM | POA: Insufficient documentation

## 2013-07-12 DIAGNOSIS — F10239 Alcohol dependence with withdrawal, unspecified: Secondary | ICD-10-CM | POA: Insufficient documentation

## 2014-02-07 DIAGNOSIS — F329 Major depressive disorder, single episode, unspecified: Secondary | ICD-10-CM | POA: Insufficient documentation

## 2014-02-07 DIAGNOSIS — F32A Depression, unspecified: Secondary | ICD-10-CM | POA: Insufficient documentation

## 2014-02-14 DIAGNOSIS — K922 Gastrointestinal hemorrhage, unspecified: Secondary | ICD-10-CM | POA: Insufficient documentation

## 2014-02-16 DIAGNOSIS — K921 Melena: Secondary | ICD-10-CM | POA: Insufficient documentation

## 2014-04-11 DIAGNOSIS — F1219 Cannabis abuse with unspecified cannabis-induced disorder: Secondary | ICD-10-CM | POA: Insufficient documentation

## 2014-04-11 DIAGNOSIS — F1120 Opioid dependence, uncomplicated: Secondary | ICD-10-CM | POA: Insufficient documentation

## 2014-04-11 DIAGNOSIS — F1510 Other stimulant abuse, uncomplicated: Secondary | ICD-10-CM | POA: Insufficient documentation

## 2014-05-07 DIAGNOSIS — M792 Neuralgia and neuritis, unspecified: Secondary | ICD-10-CM | POA: Insufficient documentation

## 2014-06-24 DIAGNOSIS — R0781 Pleurodynia: Secondary | ICD-10-CM | POA: Insufficient documentation

## 2014-06-24 DIAGNOSIS — S2249XA Multiple fractures of ribs, unspecified side, initial encounter for closed fracture: Secondary | ICD-10-CM | POA: Insufficient documentation

## 2014-10-16 DIAGNOSIS — F1994 Other psychoactive substance use, unspecified with psychoactive substance-induced mood disorder: Secondary | ICD-10-CM | POA: Insufficient documentation

## 2014-10-25 ENCOUNTER — Encounter (HOSPITAL_COMMUNITY): Payer: Self-pay | Admitting: Emergency Medicine

## 2014-10-25 ENCOUNTER — Emergency Department (HOSPITAL_COMMUNITY)
Admission: EM | Admit: 2014-10-25 | Discharge: 2014-10-26 | Disposition: A | Discharge status: Other Acute Inpatient Hospital | Payer: Self-pay | Attending: Emergency Medicine | Admitting: Emergency Medicine

## 2014-10-25 DIAGNOSIS — F10288 Alcohol dependence with other alcohol-induced disorder: Secondary | ICD-10-CM

## 2014-10-25 DIAGNOSIS — R45851 Suicidal ideations: Secondary | ICD-10-CM | POA: Insufficient documentation

## 2014-10-25 DIAGNOSIS — G629 Polyneuropathy, unspecified: Secondary | ICD-10-CM | POA: Insufficient documentation

## 2014-10-25 DIAGNOSIS — Z87828 Personal history of other (healed) physical injury and trauma: Secondary | ICD-10-CM | POA: Insufficient documentation

## 2014-10-25 DIAGNOSIS — Z79899 Other long term (current) drug therapy: Secondary | ICD-10-CM | POA: Insufficient documentation

## 2014-10-25 DIAGNOSIS — Z720 Tobacco use: Secondary | ICD-10-CM | POA: Insufficient documentation

## 2014-10-25 DIAGNOSIS — Z590 Homelessness: Secondary | ICD-10-CM | POA: Insufficient documentation

## 2014-10-25 HISTORY — DX: Major depressive disorder, single episode, unspecified: F32.9

## 2014-10-25 HISTORY — DX: Anxiety disorder, unspecified: F41.9

## 2014-10-25 HISTORY — DX: Depression, unspecified: F32.A

## 2014-10-25 LAB — COMPREHENSIVE METABOLIC PANEL
ALK PHOS: 100 U/L (ref 39–117)
ALT: 27 U/L (ref 0–53)
AST: 38 U/L — AB (ref 0–37)
Albumin: 3.6 g/dL (ref 3.5–5.2)
Anion gap: 7 (ref 5–15)
BILIRUBIN TOTAL: 0.9 mg/dL (ref 0.3–1.2)
BUN: 5 mg/dL — AB (ref 6–23)
CHLORIDE: 101 mmol/L (ref 96–112)
CO2: 28 mmol/L (ref 19–32)
Calcium: 8.8 mg/dL (ref 8.4–10.5)
Creatinine, Ser: 0.76 mg/dL (ref 0.50–1.35)
Glucose, Bld: 97 mg/dL (ref 70–99)
POTASSIUM: 3.3 mmol/L — AB (ref 3.5–5.1)
SODIUM: 136 mmol/L (ref 135–145)
TOTAL PROTEIN: 7.2 g/dL (ref 6.0–8.3)

## 2014-10-25 LAB — RAPID URINE DRUG SCREEN, HOSP PERFORMED
AMPHETAMINES: NOT DETECTED
Barbiturates: NOT DETECTED
Benzodiazepines: POSITIVE — AB
COCAINE: NOT DETECTED
Opiates: NOT DETECTED
TETRAHYDROCANNABINOL: POSITIVE — AB

## 2014-10-25 LAB — CBC
HCT: 38 % — ABNORMAL LOW (ref 39.0–52.0)
HEMOGLOBIN: 12.8 g/dL — AB (ref 13.0–17.0)
MCH: 32.7 pg (ref 26.0–34.0)
MCHC: 33.7 g/dL (ref 30.0–36.0)
MCV: 96.9 fL (ref 78.0–100.0)
PLATELETS: 234 10*3/uL (ref 150–400)
RBC: 3.92 MIL/uL — ABNORMAL LOW (ref 4.22–5.81)
RDW: 14.2 % (ref 11.5–15.5)
WBC: 5.4 10*3/uL (ref 4.0–10.5)

## 2014-10-25 LAB — ETHANOL: Alcohol, Ethyl (B): <5 mg/dL (ref 0–9)

## 2014-10-25 LAB — ACETAMINOPHEN LEVEL: Acetaminophen (Tylenol), Serum: <10 ug/mL — ABNORMAL LOW (ref 10–30)

## 2014-10-25 LAB — SALICYLATE LEVEL: Salicylate Lvl: <4 mg/dL (ref 2.8–20.0)

## 2014-10-25 MED ORDER — THIAMINE HCL 100 MG/ML IJ SOLN: 100.0000 mg | Freq: Every day | INTRAMUSCULAR | Status: DC

## 2014-10-25 MED ORDER — VITAMIN B-1 100 MG PO TABS
100.0000 mg | ORAL_TABLET | Freq: Every day | ORAL | Status: DC
  Administered 2014-10-25: 100 mg via ORAL
  Filled 2014-10-25: qty 1

## 2014-10-25 MED ORDER — POTASSIUM CHLORIDE CRYS ER 20 MEQ PO TBCR
20.0000 meq | EXTENDED_RELEASE_TABLET | Freq: Once | ORAL | Status: AC
  Administered 2014-10-25: 20 meq via ORAL
  Filled 2014-10-25: qty 1

## 2014-10-25 MED ORDER — LORAZEPAM 2 MG/ML IJ SOLN: 0.0000 mg | Freq: Four times a day (QID) | INTRAMUSCULAR | Status: DC

## 2014-10-25 MED ORDER — LORAZEPAM 2 MG/ML IJ SOLN: 0.0000 mg | Freq: Two times a day (BID) | INTRAMUSCULAR | Status: DC

## 2014-10-25 MED ORDER — LORAZEPAM 1 MG PO TABS
0.0000 mg | ORAL_TABLET | Freq: Two times a day (BID) | ORAL | Status: DC
  Filled 2014-10-25: qty 1

## 2014-10-25 MED ORDER — LORAZEPAM 1 MG PO TABS: 1.0000 mg | ORAL_TABLET | Freq: Once | ORAL | Status: AC

## 2014-10-25 MED ORDER — LORAZEPAM 1 MG PO TABS
0.0000 mg | ORAL_TABLET | Freq: Four times a day (QID) | ORAL | Status: DC
  Administered 2014-10-25 – 2014-10-26 (×2): 1 mg via ORAL
  Filled 2014-10-25: qty 1

## 2014-10-25 NOTE — BH Assessment (Addendum)
Tele Assessment Note   [name] [name] is a 54 y.o. male who [facility] with SI/depression and requesting alcohol, cocaine and marijuana detox.  Pt is homeless.  Pt reports the following: he's been SI x 1 week with a plan to cut himself.  Pt states he tried to cut himself approx 4 days ago.  Pt denies previous SI attempts.  Pt says his SI thoughts are triggered by losing family members, homelessness, SA and his well being.  Pt says--" I do not have any reason to live".  Pt also admits drinking 1/5 of liquor and 3-4 24oz beers daily, his last drink was 2-3 days ago and he drank 1/5 of liquor and 3-4 24oz beers.  Pt states he drinks about the same everyday.  He consumes a  $20 bag of cocaine on a monthly basis and says his last use was 4-5 days ago and he used a $20 bag. Pt smokes 2 marijuana "blunts" and says his last use was 10/22/14 and he smoked 2 "blunts".  Pt is experiencing w/d sxs: tremors, runny nose, watery eyes, anxiety and body aches.  He admits he has up coming court date on 12/2014 for shoplifting([employer]).  Pt told this Clinical research associatewriter that he "went through DT's about 3 wks ago and denies any seizure activity due to SA but says he has blackouts.  Pt denies HI/AVH.  Axis I: Major Depression, Recurrent severe and Alcohol use disorder, Severe; Cocaine use disorder, Mild; Cannabis use disorder, Mild Axis II: Deferred Axis III:  Past Medical History  Diagnosis Date  . Alcoholic   . Peripheral neuropathy    Axis IV: other psychosocial or environmental problems, problems related to social environment and problems with primary support group Axis V: 31-40 impairment in reality testing  Past Medical History:  Past Medical History  Diagnosis Date  . Alcoholic   . Peripheral neuropathy     Past Surgical History  Procedure Laterality Date  . Hip surgery      Family History: No family history on file.  Social History:  reports that he has been smoking.  He uses smokeless tobacco.  He reports that he drinks alcohol. He reports that he uses illicit drugs.  Additional Social History:     CIWA: CIWA-Ar BP: 145/82 mmHg Pulse Rate: 85 Nausea and Vomiting: intermittent nausea with dry heaves (pt reports feeling nauseated; last vomited this morning) Tactile Disturbances: none Tremor: two Auditory Disturbances: not present Paroxysmal Sweats: no sweat visible Visual Disturbances: not present Anxiety: mildly anxious Headache, Fullness in Head: none present Agitation: somewhat more than normal activity Orientation and Clouding of Sensorium: oriented and can do serial additions CIWA-Ar Total: 8 COWS:    PATIENT STRENGTHS: (choose at least two) Motivation for treatment/growth  Allergies:  Allergies  Allergen Reactions  . Nsaids Nausea And Vomiting and Other (See Comments)    Severe cramping  . Ketorolac Other (See Comments)  . Pantoprazole Sodium Hives    Home Medications:  (Not in a hospital admission)  OB/GYN Status:  No LMP for male patient.  General Assessment Data Admission Status: Voluntary           Risk to self with the past 6 months Is patient at risk for suicide?: Yes Substance abuse history and/or treatment for substance abuse?: Yes        Mental Status Report Motor Activity: Restlessness, Tremors, Unsteady  Advance Directives (For Healthcare) Does patient have an advance directive?: No Would patient like information on creating an advanced directive?: No - patient declined information          Disposition:     Murrell Redden 10/25/2014 7:30 PM

## 2014-10-25 NOTE — ED Notes (Signed)
Pt presents for detox from drugs and alcohol and also admits to SI- denies HI.  Pt admits to drinking a fifth of liquor a day but has not had anything to drink for the past 3 days, admits to occasional marijuana and cocaine use.  Pt reports SI- states "I do not have any reason to live", admits to plan to cut wrists.

## 2014-10-25 NOTE — ED Notes (Signed)
Pt changed into paper scrubs, belongings with staff, pt wanded by security.  

## 2014-10-25 NOTE — ED Provider Notes (Signed)
CSN: [id number]     Arrival date & time 10/25/14  1726 History   First MD Initiated Contact with Patient 10/25/14 1813     Chief Complaint  Patient presents with  . Suicidal    The history is provided by the patient. No language interpreter was used.   [name] [name] presents for evaluation of suicidal thoughts. He reports that he's been feeling suicidal and hopeless for the last 4 days. He tried to cut his wrists with a pocket knife a few days ago. He reports feeling hopeless and suicidal due to his alcohol addiction. He wants to stop drinking but he can't find a way to stop. He drinks about a fifth of gin daily. Recently admitted to [facility] for DTs 2-3 weeks ago. He is currently homeless. Symptoms are moderate, constant, worsening. He last drank yesterday.  Past Medical History  Diagnosis Date  . Alcoholic   . Peripheral neuropathy    Past Surgical History  Procedure Laterality Date  . Hip surgery     No family history on file. History  Substance Use Topics  . Smoking status: Current Every Day Smoker  . Smokeless tobacco: Current User  . Alcohol Use: 0.0 oz/week    Review of Systems  All other systems reviewed and are negative.     Allergies  Nsaids; Ketorolac; and Pantoprazole sodium  Home Medications   Prior to Admission medications   Medication Sig Start Date End Date Taking? Authorizing Provider  benztropine (COGENTIN) 1 MG tablet Take 2 mg by mouth 2 (two) times daily. 09/17/14   Historical Provider, MD  clonazePAM (KLONOPIN) 2 MG tablet Take 2 mg by mouth 3 (three) times daily as needed.      Historical Provider, MD  divalproex (DEPAKOTE ER) 500 MG 24 hr tablet Take 500 mg by mouth daily.     Historical Provider, MD  folic acid (FOLVITE) 1 MG tablet Take 1 mg by mouth daily.    Historical Provider, MD  gabapentin (NEURONTIN) 100 MG capsule Take 300 mg by mouth 3 (three) times daily.     Historical Provider, MD  haloperidol (HALDOL) 2 MG tablet Take 2 mg by mouth  3 (three) times daily.    Historical Provider, MD  lactulose (CEPHULAC) 10 G packet Take 30 g by mouth 2 (two) times daily.    Historical Provider, MD  LORazepam (ATIVAN) 0.5 MG tablet Take 0.5 mg by mouth 3 (three) times daily.    Historical Provider, MD  valproic acid (DEPAKENE) 250 MG capsule Take 250 mg by mouth 2 (two) times daily.    Historical Provider, MD   BP 145/82 mmHg  Pulse 85  Temp(Src) 98.1 F (36.7 C)  Resp 18  SpO2 97% Physical Exam  Constitutional: He is oriented to person, place, and time. He appears well-developed and well-nourished.  HENT:  Head: Normocephalic and atraumatic.  Cardiovascular: Normal rate and regular rhythm.   No murmur heard. Pulmonary/Chest: Effort normal and breath sounds normal. No respiratory distress.  Abdominal: Soft. There is no tenderness. There is no rebound and no guarding.  Musculoskeletal: He exhibits no edema or tenderness.  Healing abrasion to left wrist  Neurological: He is alert and oriented to person, place, and time.  Slight tremor of bilateral upper extremities  Skin: Skin is warm and dry.  Psychiatric:  Depressed mood and affect.  Nursing note and vitals reviewed.   ED Course  Procedures (including critical care time) Labs Review Labs Reviewed  CBC - Abnormal; Notable  for the following:    RBC 3.92 (*)    Hemoglobin 12.8 (*)    HCT 38.0 (*)    All other components within normal limits  COMPREHENSIVE METABOLIC PANEL - Abnormal; Notable for the following:    Potassium 3.3 (*)    BUN 5 (*)    AST 38 (*)    All other components within normal limits  ACETAMINOPHEN LEVEL - Abnormal; Notable for the following:    Acetaminophen (Tylenol), Serum <10.0 (*)    All other components within normal limits  URINE RAPID DRUG SCREEN (HOSP PERFORMED) - Abnormal; Notable for the following:    Benzodiazepines POSITIVE (*)    Tetrahydrocannabinol POSITIVE (*)    All other components within normal limits  ETHANOL  SALICYLATE LEVEL     Imaging Review No results found.   EKG Interpretation None      MDM   Final diagnoses:  Alcohol dependence with other alcohol-induced disorder  Suicidal thoughts    Patient here for evaluation of suicidal ideation and alcohol abuse. Patient actively suicidal in the emergency department with plan to cut his wrists. Psych evaluation pending. Patient has been medically cleared for psychiatric treatment.    [name] FossaElizabeth Areeba Sulser, MD 10/26/14 [phone]

## 2014-10-26 ENCOUNTER — Inpatient Hospital Stay (HOSPITAL_COMMUNITY)
Admission: EM | Admit: 2014-10-26 | Discharge: 2014-11-02 | DRG: 897 | Disposition: A | Discharge status: Home / Self Care | Payer: 59 | Source: Intra-hospital | Attending: Psychiatry | Admitting: Psychiatry

## 2014-10-26 ENCOUNTER — Encounter (HOSPITAL_COMMUNITY): Payer: Self-pay | Admitting: *Deleted

## 2014-10-26 DIAGNOSIS — F17210 Nicotine dependence, cigarettes, uncomplicated: Secondary | ICD-10-CM | POA: Diagnosis present

## 2014-10-26 DIAGNOSIS — F10239 Alcohol dependence with withdrawal, unspecified: Secondary | ICD-10-CM | POA: Diagnosis present

## 2014-10-26 DIAGNOSIS — S93409A Sprain of unspecified ligament of unspecified ankle, initial encounter: Secondary | ICD-10-CM | POA: Diagnosis present

## 2014-10-26 DIAGNOSIS — F1020 Alcohol dependence, uncomplicated: Secondary | ICD-10-CM | POA: Diagnosis present

## 2014-10-26 DIAGNOSIS — Z590 Homelessness: Secondary | ICD-10-CM | POA: Diagnosis not present

## 2014-10-26 DIAGNOSIS — F1290 Cannabis use, unspecified, uncomplicated: Secondary | ICD-10-CM | POA: Diagnosis not present

## 2014-10-26 DIAGNOSIS — G4700 Insomnia, unspecified: Secondary | ICD-10-CM | POA: Diagnosis present

## 2014-10-26 DIAGNOSIS — F1994 Other psychoactive substance use, unspecified with psychoactive substance-induced mood disorder: Secondary | ICD-10-CM | POA: Diagnosis not present

## 2014-10-26 DIAGNOSIS — F1014 Alcohol abuse with alcohol-induced mood disorder: Secondary | ICD-10-CM | POA: Diagnosis present

## 2014-10-26 DIAGNOSIS — F419 Anxiety disorder, unspecified: Secondary | ICD-10-CM | POA: Diagnosis present

## 2014-10-26 DIAGNOSIS — F1490 Cocaine use, unspecified, uncomplicated: Secondary | ICD-10-CM

## 2014-10-26 DIAGNOSIS — F329 Major depressive disorder, single episode, unspecified: Secondary | ICD-10-CM | POA: Diagnosis present

## 2014-10-26 DIAGNOSIS — F10939 Alcohol use, unspecified with withdrawal, unspecified: Secondary | ICD-10-CM | POA: Diagnosis present

## 2014-10-26 DIAGNOSIS — F1220 Cannabis dependence, uncomplicated: Secondary | ICD-10-CM | POA: Diagnosis present

## 2014-10-26 DIAGNOSIS — F19929 Other psychoactive substance use, unspecified with intoxication, unspecified: Secondary | ICD-10-CM

## 2014-10-26 MED ORDER — HALOPERIDOL 2 MG PO TABS: 2.0000 mg | ORAL_TABLET | Freq: Three times a day (TID) | ORAL | Status: DC

## 2014-10-26 MED ORDER — MAGNESIUM HYDROXIDE 400 MG/5ML PO SUSP: 30.0000 mL | Freq: Every day | ORAL | Status: DC | PRN

## 2014-10-26 MED ORDER — ADULT MULTIVITAMIN W/MINERALS CH
1.0000 | ORAL_TABLET | Freq: Every day | ORAL | Status: DC
  Administered 2014-10-26 – 2014-11-02 (×8): 1 via ORAL
  Filled 2014-10-26 (×9): qty 1

## 2014-10-26 MED ORDER — CHLORDIAZEPOXIDE HCL 25 MG PO CAPS
25.0000 mg | ORAL_CAPSULE | Freq: Four times a day (QID) | ORAL | Status: AC | PRN
  Filled 2014-10-26: qty 1

## 2014-10-26 MED ORDER — TRAMADOL HCL 50 MG PO TABS
50.0000 mg | ORAL_TABLET | Freq: Two times a day (BID) | ORAL | Status: DC
  Administered 2014-10-26 – 2014-10-30 (×8): 50 mg via ORAL
  Filled 2014-10-26 (×9): qty 1

## 2014-10-26 MED ORDER — ACETAMINOPHEN 325 MG PO TABS
650.0000 mg | ORAL_TABLET | Freq: Four times a day (QID) | ORAL | Status: DC | PRN
  Administered 2014-10-28 – 2014-10-29 (×2): 650 mg via ORAL
  Filled 2014-10-26 (×2): qty 2

## 2014-10-26 MED ORDER — HYDROXYZINE HCL 25 MG PO TABS: 25.0000 mg | ORAL_TABLET | Freq: Four times a day (QID) | ORAL | Status: AC | PRN

## 2014-10-26 MED ORDER — ONDANSETRON 4 MG PO TBDP
4.0000 mg | ORAL_TABLET | Freq: Four times a day (QID) | ORAL | Status: AC | PRN
  Administered 2014-10-28: 4 mg via ORAL
  Filled 2014-10-26: qty 1

## 2014-10-26 MED ORDER — HALOPERIDOL 1 MG PO TABS
1.0000 mg | ORAL_TABLET | Freq: Two times a day (BID) | ORAL | Status: DC
  Administered 2014-10-26: 1 mg via ORAL
  Filled 2014-10-26 (×3): qty 1

## 2014-10-26 MED ORDER — BENZTROPINE MESYLATE 2 MG PO TABS
2.0000 mg | ORAL_TABLET | Freq: Two times a day (BID) | ORAL | Status: DC
  Administered 2014-10-26 – 2014-10-27 (×3): 2 mg via ORAL
  Filled 2014-10-26 (×7): qty 1

## 2014-10-26 MED ORDER — THIAMINE HCL 100 MG/ML IJ SOLN: 100.0000 mg | Freq: Once | INTRAMUSCULAR | Status: DC

## 2014-10-26 MED ORDER — CHLORDIAZEPOXIDE HCL 25 MG PO CAPS
25.0000 mg | ORAL_CAPSULE | Freq: Four times a day (QID) | ORAL | Status: AC
  Administered 2014-10-26 (×4): 25 mg via ORAL
  Filled 2014-10-26 (×4): qty 1

## 2014-10-26 MED ORDER — CHLORDIAZEPOXIDE HCL 25 MG PO CAPS
25.0000 mg | ORAL_CAPSULE | Freq: Every day | ORAL | Status: AC
  Administered 2014-10-29: 25 mg via ORAL
  Filled 2014-10-26: qty 1

## 2014-10-26 MED ORDER — CHLORDIAZEPOXIDE HCL 25 MG PO CAPS
25.0000 mg | ORAL_CAPSULE | Freq: Three times a day (TID) | ORAL | Status: AC
  Administered 2014-10-27 (×3): 25 mg via ORAL
  Filled 2014-10-26 (×2): qty 1

## 2014-10-26 MED ORDER — ACETAMINOPHEN 325 MG PO TABS: 650.0000 mg | ORAL_TABLET | Freq: Four times a day (QID) | ORAL | Status: DC | PRN

## 2014-10-26 MED ORDER — GABAPENTIN 100 MG PO CAPS
200.0000 mg | ORAL_CAPSULE | Freq: Two times a day (BID) | ORAL | Status: DC
  Administered 2014-10-26 – 2014-10-27 (×3): 200 mg via ORAL
  Filled 2014-10-26 (×7): qty 2

## 2014-10-26 MED ORDER — VITAMIN B-1 100 MG PO TABS
100.0000 mg | ORAL_TABLET | Freq: Every day | ORAL | Status: DC
  Administered 2014-10-27 – 2014-11-02 (×7): 100 mg via ORAL
  Filled 2014-10-26 (×8): qty 1

## 2014-10-26 MED ORDER — ALUM & MAG HYDROXIDE-SIMETH 200-200-20 MG/5ML PO SUSP: 30.0000 mL | ORAL | Status: DC | PRN

## 2014-10-26 MED ORDER — TRAZODONE HCL 50 MG PO TABS
50.0000 mg | ORAL_TABLET | Freq: Every evening | ORAL | Status: DC | PRN
  Administered 2014-10-26 – 2014-11-01 (×7): 50 mg via ORAL
  Filled 2014-10-26 (×17): qty 1

## 2014-10-26 MED ORDER — NICOTINE 14 MG/24HR TD PT24
14.0000 mg | MEDICATED_PATCH | Freq: Every day | TRANSDERMAL | Status: DC
  Administered 2014-10-27 – 2014-11-01 (×6): 14 mg via TRANSDERMAL
  Filled 2014-10-26 (×11): qty 1

## 2014-10-26 MED ORDER — GABAPENTIN 300 MG PO CAPS: 300.0000 mg | ORAL_CAPSULE | Freq: Three times a day (TID) | ORAL | Status: DC

## 2014-10-26 MED ORDER — LOPERAMIDE HCL 2 MG PO CAPS: 2.0000 mg | ORAL_CAPSULE | ORAL | Status: AC | PRN

## 2014-10-26 MED ORDER — LORAZEPAM 0.5 MG PO TABS
0.5000 mg | ORAL_TABLET | Freq: Three times a day (TID) | ORAL | Status: DC
Start: 2014-10-26 — End: 2014-10-26
  Administered 2014-10-26: 0.5 mg via ORAL
  Filled 2014-10-26 (×2): qty 1

## 2014-10-26 MED ORDER — VALPROIC ACID 250 MG PO CAPS
250.0000 mg | ORAL_CAPSULE | Freq: Two times a day (BID) | ORAL | Status: DC
  Administered 2014-10-26: 250 mg via ORAL
  Filled 2014-10-26 (×3): qty 1

## 2014-10-26 MED ORDER — POTASSIUM CHLORIDE CRYS ER 20 MEQ PO TBCR
20.0000 meq | EXTENDED_RELEASE_TABLET | Freq: Two times a day (BID) | ORAL | Status: AC
  Administered 2014-10-26 – 2014-10-27 (×4): 20 meq via ORAL
  Filled 2014-10-26 (×4): qty 1

## 2014-10-26 MED ORDER — CHLORDIAZEPOXIDE HCL 25 MG PO CAPS
25.0000 mg | ORAL_CAPSULE | ORAL | Status: AC
  Administered 2014-10-28 (×2): 25 mg via ORAL
  Filled 2014-10-26 (×2): qty 1

## 2014-10-26 NOTE — Plan of Care (Signed)
Problem: Ineffective individual coping Goal: STG: Patient will remain free from self harm Outcome: Progressing Patient remains free from self harm. 15 minute checks continued per protocol for patient safety.      Problem: Diagnosis: Increased Risk For Suicide Attempt Goal: STG-Patient Will Comply With Medication Regime Outcome: Progressing Patient has adhered to medication regimen today with ease.

## 2014-10-26 NOTE — Progress Notes (Signed)
Patient ID: [name] [name], male   DOB: [birth year], 54 y.o.   MRN: [id number] D: Patient in room on approach. Pt mood and affect is anxious and sad. Pt report feeling of anxiety and symptoms of withdrawals. Pt denies SI/HI/AVH. Pt attended evening wrap up group and engage in discussion. Pt denies any needs or concerns.  Cooperative with assessment. No acute distressed noted at this time.   A: Met with pt 1:1. Medications administered as prescribed. Writer encouraged pt to discuss feelings. Pt encouraged to come to staff with any question or concerns.   R: Patient remains safe. He is complaint with medications and denies any adverse reaction.

## 2014-10-26 NOTE — Progress Notes (Signed)
Vol admit, 54 yo male, to the adult [facility] requesting detox from alcohol and cocaine.  Pt reports he was also having suicidal thoughts to cut himself.  Pt is homeless and feeling hopeless/helpeless.  He reports drinking 1/5 liquor daily and maybe 3-4 24 oz beers.  He smokes THC when he can get it and a $20 bag cocaine monthly, although he was negative for cocaine.  Pt reports he has a court for shoplifting in April.  [facility] because he was going through DTs.  Pt reports he has lost over 40 lbs in the last few months d/t drug use and poor appetite.  [facility] saying he had sprained R ankle from a fall 2 weeks ago while intoxicated.  Pt denies HI/AV.  Pt was polite and cooperative with the admission process.  Paperwork signed and admission completed. Safety checks q15 minutes were initiated.

## 2014-10-26 NOTE — [facility] Group Notes (Signed)
[facility] LCSW Group Therapy  10/26/2014 1:15 pm  Type of Therapy: Process Group Therapy  Participation Level:  Invited.  Stated he did not feel well enough to attend   Summary of Progress/Problems: Today's group addressed the issue of overcoming obstacles.  Patients were asked to identify their biggest obstacle post d/c that stands in the way of their on-going success, and then problem solve as to how to manage this.  Ida Rogueorth, Arney Mayabb B 10/26/2014   3:16 PM

## 2014-10-26 NOTE — H&P (Signed)
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 Alcohol use disorder, severe, dependence [F10.20] 10/26/2014  . Alcohol withdrawal [F10.239] 10/26/2014  . Cannabis use disorder, severe, dependence [F12.20] 10/26/2014  . Gastrointestinal bleeding, lower [K92.2] 02/14/2014   History of Present Illness:: [name] [name] is a 54 y.o. C male who voluntarily presents to [facility] with SI/depression and requesting alcohol, cocaine and marijuana detox.   Patient seen this AM. Patient reports that he presented to the ED for receiving detox for his alcohol abuse. Patient reports having withdrawal sx like tremors as well as sweating and runny nose. Patient reports having SI . Patient had a plan to cut self. Patient also reports mood swings which he feels is due to his alcohol abuse and happens when he abuses drugs.     Pt also admits drinking 1/5 of liquor and 3-4 24oz beers daily, his last drink was 2-3 days ago and he drank 1/5 of liquor and 3-4 24oz beers. He consumes a $20 bag of cocaine on a monthly basis and says his last use was 4-5 days ago and he used a $20 bag. Pt smokes 2 marijuana "blunts" and says his last use was  10/22/14 and he smoked 2 "blunts".    He admits he has up coming court date on 12/2014 for shoplifting([employer]).  Patient is homeless. Patient is unemployed.   Elements:  Location:  Mood lability, alcohol abuse, alcohol withdrawal. Quality:  mood swings ,tremors, sweating. Severity:  severe. Timing:  past few days. Duration:  past few days. Context:  hx of alcohol abuse, cannabis abuse,cocaine abuse. Associated Signs/Symptoms: Depression Symptoms:  depressed mood, insomnia, psychomotor retardation, fatigue, difficulty concentrating, anxiety, decreased appetite, (Hypo) Manic Symptoms:  Distractibility, Impulsivity, Irritable Mood, Anxiety Symptoms:  Excessive Worry, Psychotic Symptoms:  denies PTSD Symptoms: Negative Total Time spent with patient: 1 hour  Past Medical History:  Past Medical History  Diagnosis Date  . Alcoholic   . Peripheral neuropathy   . Depression   . Anxiety     Past Surgical History  Procedure Laterality Date  . Hip surgery     Family History: History reviewed. No pertinent family history. Social History:  History  Alcohol Use  . 0.0 oz/week    Comment: 1/5 daily     History  Drug Use  . Yes  . Special: Cocaine, Marijuana    History   Social History  . Marital Status: Single    Spouse Name: N/A    Number of Children: N/A  . Years of Education: N/A   Social History Main Topics  . Smoking status: Current Every Day Smoker -- 1.00 packs/day for 15 years    Types: Cigarettes  . Smokeless tobacco: None  . Alcohol Use: 0.0 oz/week     Comment: 1/5 daily  . Drug Use: Yes    Special: Cocaine, Marijuana  . Sexual Activity:  No   Other Topics Concern  . None   Social History Narrative   Additional Social History:    Pain Medications: See home med list Prescriptions: See home med list Over the Counter: See home med list History of alcohol / drug use?: Yes Longest period of sobriety (when/how long): none Negative  Consequences of Use: Financial, Work / School Withdrawal Symptoms: Nausea / Vomiting, Sweats, Tremors, Other (Comment) (body aches) Name of Substance 1: alcohol 1 - Age of First Use: teens 1 - Amount (size/oz): 1/5 gin/liquor 1 - Frequency: daily 1 - Duration: ongoing 1 - Last Use / Amount: 10/23/14 Name of Substance 2: cocaine 2 - Age of First Use: teens 2 - Amount (size/oz): $20 bag 2 - Frequency: monthly 2 - Duration: ongoing 2 - Last Use / Amount: about 5 days ago Name of Substance 3: THC 3 - Age of First Use: teens 3 - Amount (size/oz): 2 blunts 3 - Frequency: weekly 3 - Duration: ongoing 3 - Last Use / Amount: 10/22/14               Musculoskeletal: Strength & Muscle Tone: within normal limits Gait & Station: normal Patient leans: N/A  Psychiatric Specialty Exam: Physical Exam  Constitutional: He is oriented to person, place, and time.  HENT:  Head: Normocephalic and atraumatic.  Eyes: Pupils are equal, round, and reactive to light.  Neck: Normal range of motion.  Cardiovascular: Normal rate.   Respiratory: Effort normal.  GI: Soft.  Musculoskeletal: Normal range of motion.  Neurological: He is alert and oriented to person, place, and time.  Skin: Skin is warm.  Psychiatric: His speech is normal. Thought content normal. His mood appears anxious. His affect is blunt. He is withdrawn. Cognition and memory are normal. He expresses impulsivity. He exhibits a depressed mood.    Review of Systems  Constitutional: Positive for malaise/fatigue and diaphoresis.  HENT: Negative.   Eyes: Negative.   Respiratory: Negative.   Cardiovascular: Negative.   Gastrointestinal: Negative.   Genitourinary: Negative.   Musculoskeletal: Positive for myalgias.  Skin: Negative.   Neurological: Negative.   Psychiatric/Behavioral: Positive for substance abuse. The patient is nervous/anxious.     Blood pressure 108/90, pulse 89, temperature 98.2 F (36.8 C), temperature source  Oral, resp. rate 17, height '6\' 1"'  (1.854 m), weight 70.761 kg (156 lb).Body mass index is 20.59 kg/(m^2).  General Appearance: Disheveled  Eye Sport and exercise psychologist::  Fair  Speech:  Slow  Volume:  Decreased  Mood:  Dysphoric  Affect:  Restricted  Thought Process:  Linear  Orientation:  Full (Time, Place, and Person)  Thought Content:  Rumination  Suicidal Thoughts:  No  Homicidal Thoughts:  No  Memory:  Immediate;   Fair Recent;   Fair Remote;   Fair  Judgement:  Impaired  Insight:  Shallow  Psychomotor Activity:  Tremor  Concentration:  Poor  Recall:  Poor  Fund of Knowledge:Poor  Language: Poor  Akathisia:  No  Handed:  Right  AIMS (if indicated):     Assets:  Communication Skills Desire for Improvement  ADL's:  Intact  Cognition: WNL  Sleep:  Number of Hours: 0   Risk to Self: Is patient at risk for suicide?: Yes What has been your use of drugs/alcohol within the last 12 months?: States he drinks fifth of gin/day; used crack cocaine but "quit" Risk to Others:  denies Prior Inpatient Therapy:  denies Prior Outpatient Therapy:  denies  Alcohol Screening: 1. How often do you have  a drink containing alcohol?: 4 or more times a week 2. How many drinks containing alcohol do you have on a typical day when you are drinking?: 7, 8, or 9 3. How often do you have six or more drinks on one occasion?: Daily or almost daily Preliminary Score: 7 4. How often during the last year have you found that you were not able to stop drinking once you had started?: Daily or almost daily 5. How often during the last year have you failed to do what was normally expected from you becasue of drinking?: Daily or almost daily 6. How often during the last year have you needed a first drink in the morning to get yourself going after a heavy drinking session?: Daily or almost daily 7. How often during the last year have you had a feeling of guilt of remorse after drinking?: Daily or almost daily 8. How often during  the last year have you been unable to remember what happened the night before because you had been drinking?: Weekly 9. Have you or someone else been injured as a result of your drinking?: Yes, during the last year 10. Has a relative or friend or a doctor or another health worker been concerned about your drinking or suggested you cut down?: Yes, during the last year Alcohol Use Disorder Identification Test Final Score (AUDIT): 38 Brief Intervention: MD notified of score 20 or above  Allergies:   Allergies  Allergen Reactions  . Nsaids Nausea And Vomiting and Other (See Comments)    Severe cramping  . Ketorolac Other (See Comments)  . Pantoprazole Sodium Hives   Lab Results:  Results for orders placed or performed during the hospital encounter of 10/25/14 (from the past 48 hour(s))  CBC     Status: Abnormal   Collection Time: 10/25/14  6:00 PM  Result Value Ref Range   WBC 5.4 4.0 - 10.5 K/uL   RBC 3.92 (L) 4.22 - 5.81 MIL/uL   Hemoglobin 12.8 (L) 13.0 - 17.0 g/dL   HCT 38.0 (L) 39.0 - 52.0 %   MCV 96.9 78.0 - 100.0 fL   MCH 32.7 26.0 - 34.0 pg   MCHC 33.7 30.0 - 36.0 g/dL   RDW 14.2 11.5 - 15.5 %   Platelets 234 150 - 400 K/uL  Comprehensive metabolic panel     Status: Abnormal   Collection Time: 10/25/14  6:00 PM  Result Value Ref Range   Sodium 136 135 - 145 mmol/L   Potassium 3.3 (L) 3.5 - 5.1 mmol/L   Chloride 101 96 - 112 mmol/L   CO2 28 19 - 32 mmol/L   Glucose, Bld 97 70 - 99 mg/dL   BUN 5 (L) 6 - 23 mg/dL   Creatinine, Ser 0.76 0.50 - 1.35 mg/dL   Calcium 8.8 8.4 - 10.5 mg/dL   Total Protein 7.2 6.0 - 8.3 g/dL   Albumin 3.6 3.5 - 5.2 g/dL   AST 38 (H) 0 - 37 U/L   ALT 27 0 - 53 U/L   Alkaline Phosphatase 100 39 - 117 U/L   Total Bilirubin 0.9 0.3 - 1.2 mg/dL   GFR calc non Af Amer >90 >90 mL/min   GFR calc Af Amer >90 >90 mL/min    Comment: (NOTE) The eGFR has been calculated using the CKD EPI equation. This calculation has not been validated in all clinical  situations. eGFR's persistently <90 mL/min signify possible Chronic Kidney Disease.    Anion gap 7 5 -  15  Ethanol (ETOH)     Status: None   Collection Time: 10/25/14  6:00 PM  Result Value Ref Range   Alcohol, Ethyl (B) <5 0 - 9 mg/dL    Comment:        LOWEST DETECTABLE LIMIT FOR SERUM ALCOHOL IS 11 mg/dL FOR MEDICAL PURPOSES ONLY   Acetaminophen level     Status: Abnormal   Collection Time: 10/25/14  6:00 PM  Result Value Ref Range   Acetaminophen (Tylenol), Serum <10.0 (L) 10 - 30 ug/mL    Comment:        THERAPEUTIC CONCENTRATIONS VARY SIGNIFICANTLY. A RANGE OF 10-30 ug/mL MAY BE AN EFFECTIVE CONCENTRATION FOR MANY PATIENTS. HOWEVER, SOME ARE BEST TREATED AT CONCENTRATIONS OUTSIDE THIS RANGE. ACETAMINOPHEN CONCENTRATIONS >150 ug/mL AT 4 HOURS AFTER INGESTION AND >50 ug/mL AT 12 HOURS AFTER INGESTION ARE OFTEN ASSOCIATED WITH TOXIC REACTIONS.   Salicylate level     Status: None   Collection Time: 10/25/14  6:00 PM  Result Value Ref Range   Salicylate Lvl <0.2 2.8 - 20.0 mg/dL  Urine rapid drug screen (hosp performed)     Status: Abnormal   Collection Time: 10/25/14  7:56 PM  Result Value Ref Range   Opiates NONE DETECTED NONE DETECTED   Cocaine NONE DETECTED NONE DETECTED   Benzodiazepines POSITIVE (A) NONE DETECTED   Amphetamines NONE DETECTED NONE DETECTED   Tetrahydrocannabinol POSITIVE (A) NONE DETECTED   Barbiturates NONE DETECTED NONE DETECTED    Comment:        DRUG SCREEN FOR MEDICAL PURPOSES ONLY.  IF CONFIRMATION IS NEEDED FOR ANY PURPOSE, NOTIFY LAB WITHIN 5 DAYS.        LOWEST DETECTABLE LIMITS FOR URINE DRUG SCREEN Drug Class       Cutoff (ng/mL) Amphetamine      1000 Barbiturate      200 Benzodiazepine   585 Tricyclics       277 Opiates          300 Cocaine          300 THC              50    Current Medications: Current Facility-Administered Medications  Medication Dose Route Frequency Provider Last Rate Last Dose  . acetaminophen  (TYLENOL) tablet 650 mg  650 mg Oral Q6H PRN Ursula Alert, MD      . alum & mag hydroxide-simeth (MAALOX/MYLANTA) 200-200-20 MG/5ML suspension 30 mL  30 mL Oral Q4H PRN Laverle Hobby, PA-C      . benztropine (COGENTIN) tablet 2 mg  2 mg Oral BID Laverle Hobby, PA-C   2 mg at 10/26/14 0834  . chlordiazePOXIDE (LIBRIUM) capsule 25 mg  25 mg Oral Q6H PRN Laverle Hobby, PA-C      . chlordiazePOXIDE (LIBRIUM) capsule 25 mg  25 mg Oral QID Laverle Hobby, PA-C   25 mg at 10/26/14 1230   Followed by  . [START ON 10/27/2014] chlordiazePOXIDE (LIBRIUM) capsule 25 mg  25 mg Oral TID Laverle Hobby, PA-C       Followed by  . [START ON 10/28/2014] chlordiazePOXIDE (LIBRIUM) capsule 25 mg  25 mg Oral BH-qamhs Spencer E Simon, PA-C       Followed by  . [START ON 10/29/2014] chlordiazePOXIDE (LIBRIUM) capsule 25 mg  25 mg Oral Daily Laverle Hobby, PA-C      . gabapentin (NEURONTIN) capsule 200 mg  200 mg Oral BID Laverle Hobby, PA-C  200 mg at 10/26/14 9622  . hydrOXYzine (ATARAX/VISTARIL) tablet 25 mg  25 mg Oral Q6H PRN Laverle Hobby, PA-C      . loperamide (IMODIUM) capsule 2-4 mg  2-4 mg Oral PRN Laverle Hobby, PA-C      . magnesium hydroxide (MILK OF MAGNESIA) suspension 30 mL  30 mL Oral Daily PRN Laverle Hobby, PA-C      . multivitamin with minerals tablet 1 tablet  1 tablet Oral Daily Laverle Hobby, PA-C   1 tablet at 10/26/14 [phone]  . nicotine (NICODERM CQ - dosed in mg/24 hours) patch 14 mg  14 mg Transdermal Daily Laverle Hobby, PA-C   14 mg at 10/26/14 0836  . ondansetron (ZOFRAN-ODT) disintegrating tablet 4 mg  4 mg Oral Q6H PRN Laverle Hobby, PA-C      . potassium chloride SA (K-DUR,KLOR-CON) CR tablet 20 mEq  20 mEq Oral BID Laverle Hobby, PA-C   20 mEq at 10/26/14 0834  . thiamine (B-1) injection 100 mg  100 mg Intramuscular Once Laverle Hobby, PA-C   100 mg at 10/26/14 0500  . [START ON 10/27/2014] thiamine (VITAMIN B-1) tablet 100 mg  100 mg Oral Daily Laverle Hobby, PA-C      . traMADol (ULTRAM) tablet 50 mg  50 mg Oral Q12H Janiqua Friscia, MD   50 mg at 10/26/14 1231  . traZODone (DESYREL) tablet 50 mg  50 mg Oral QHS,MR X 1 Spencer E Simon, PA-C       PTA Medications: Prescriptions prior to admission  Medication Sig Dispense Refill Last Dose  . benztropine (COGENTIN) 1 MG tablet Take 2 mg by mouth 2 (two) times daily.   Not Taking at Unknown time  . clonazePAM (KLONOPIN) 2 MG tablet Take 2 mg by mouth 3 (three) times daily as needed.     Not Taking at Unknown time  . divalproex (DEPAKOTE ER) 500 MG 24 hr tablet Take 500 mg by mouth daily.    Not Taking at Unknown time  . folic acid (FOLVITE) 1 MG tablet Take 1 mg by mouth daily.   Not Taking at Unknown time  . gabapentin (NEURONTIN) 100 MG capsule Take 300 mg by mouth 3 (three) times daily.    Not Taking at Unknown time  . haloperidol (HALDOL) 2 MG tablet Take 2 mg by mouth 3 (three) times daily.   Not Taking at Unknown time  . lactulose (CEPHULAC) 10 G packet Take 30 g by mouth 2 (two) times daily.   Not Taking at Unknown time  . LORazepam (ATIVAN) 0.5 MG tablet Take 0.5 mg by mouth 3 (three) times daily.   Not Taking at Unknown time  . valproic acid (DEPAKENE) 250 MG capsule Take 250 mg by mouth 2 (two) times daily.   Not Taking at Unknown time    Previous Psychotropic Medications: Yes , unable to specify  Substance Abuse History in the last 12 months:  Yes.      Consequences of Substance Abuse: Medical Consequences:  recent admission Legal Consequences:  upcoming court hearing as well as past hx of being in prison DT's:  Results for orders placed or performed during the hospital encounter of 10/25/14 (from the past 72 hour(s))  CBC     Status: Abnormal   Collection Time: 10/25/14  6:00 PM  Result Value Ref Range   WBC 5.4 4.0 - 10.5 K/uL   RBC 3.92 (L) 4.22 - 5.81 MIL/uL  Hemoglobin 12.8 (L) 13.0 - 17.0 g/dL   HCT 38.0 (L) 39.0 - 52.0 %   MCV 96.9 78.0 - 100.0 fL   MCH 32.7  26.0 - 34.0 pg   MCHC 33.7 30.0 - 36.0 g/dL   RDW 14.2 11.5 - 15.5 %   Platelets 234 150 - 400 K/uL  Comprehensive metabolic panel     Status: Abnormal   Collection Time: 10/25/14  6:00 PM  Result Value Ref Range   Sodium 136 135 - 145 mmol/L   Potassium 3.3 (L) 3.5 - 5.1 mmol/L   Chloride 101 96 - 112 mmol/L   CO2 28 19 - 32 mmol/L   Glucose, Bld 97 70 - 99 mg/dL   BUN 5 (L) 6 - 23 mg/dL   Creatinine, Ser 0.76 0.50 - 1.35 mg/dL   Calcium 8.8 8.4 - 10.5 mg/dL   Total Protein 7.2 6.0 - 8.3 g/dL   Albumin 3.6 3.5 - 5.2 g/dL   AST 38 (H) 0 - 37 U/L   ALT 27 0 - 53 U/L   Alkaline Phosphatase 100 39 - 117 U/L   Total Bilirubin 0.9 0.3 - 1.2 mg/dL   GFR calc non Af Amer >90 >90 mL/min   GFR calc Af Amer >90 >90 mL/min    Comment: (NOTE) The eGFR has been calculated using the CKD EPI equation. This calculation has not been validated in all clinical situations. eGFR's persistently <90 mL/min signify possible Chronic Kidney Disease.    Anion gap 7 5 - 15  Ethanol (ETOH)     Status: None   Collection Time: 10/25/14  6:00 PM  Result Value Ref Range   Alcohol, Ethyl (B) <5 0 - 9 mg/dL    Comment:        LOWEST DETECTABLE LIMIT FOR SERUM ALCOHOL IS 11 mg/dL FOR MEDICAL PURPOSES ONLY   Acetaminophen level     Status: Abnormal   Collection Time: 10/25/14  6:00 PM  Result Value Ref Range   Acetaminophen (Tylenol), Serum <10.0 (L) 10 - 30 ug/mL    Comment:        THERAPEUTIC CONCENTRATIONS VARY SIGNIFICANTLY. A RANGE OF 10-30 ug/mL MAY BE AN EFFECTIVE CONCENTRATION FOR MANY PATIENTS. HOWEVER, SOME ARE BEST TREATED AT CONCENTRATIONS OUTSIDE THIS RANGE. ACETAMINOPHEN CONCENTRATIONS >150 ug/mL AT 4 HOURS AFTER INGESTION AND >50 ug/mL AT 12 HOURS AFTER INGESTION ARE OFTEN ASSOCIATED WITH TOXIC REACTIONS.   Salicylate level     Status: None   Collection Time: 10/25/14  6:00 PM  Result Value Ref Range   Salicylate Lvl <7.0 2.8 - 20.0 mg/dL  Urine rapid drug screen (hosp  performed)     Status: Abnormal   Collection Time: 10/25/14  7:56 PM  Result Value Ref Range   Opiates NONE DETECTED NONE DETECTED   Cocaine NONE DETECTED NONE DETECTED   Benzodiazepines POSITIVE (A) NONE DETECTED   Amphetamines NONE DETECTED NONE DETECTED   Tetrahydrocannabinol POSITIVE (A) NONE DETECTED   Barbiturates NONE DETECTED NONE DETECTED    Comment:        DRUG SCREEN FOR MEDICAL PURPOSES ONLY.  IF CONFIRMATION IS NEEDED FOR ANY PURPOSE, NOTIFY LAB WITHIN 5 DAYS.        LOWEST DETECTABLE LIMITS FOR URINE DRUG SCREEN Drug Class       Cutoff (ng/mL) Amphetamine      1000 Barbiturate      200 Benzodiazepine   263 Tricyclics       785 Opiates  300 Cocaine          300 THC              50     Observation Level/Precautions:  Fall Seizure  Laboratory:  TSH  Psychotherapy:  Individual and group  Medications:  As needed  Consultations:  As needed  Discharge Concerns:  Stability and safety       Psychological Evaluations: No   Treatment Plan Summary: Daily contact with patient to assess and evaluate symptoms and progress in treatment and Medication management  Patient will benefit from inpatient treatment and stabilization.  Estimated length of stay is 5-7 days.  Reviewed past medical records,treatment plan.    Will continue CIWA/librium protocol. Will continue Gabapentin 200 mg po bid. Will continue Trazodone 50 mg po qhs prn. Will provide pain management for sprained ankle - right sided- mild swelling- no tenderness. Reports Xray done 2 weeks ago was negative.   Will continue to monitor vitals ,medication compliance and treatment side effects while patient is here.  Will monitor for medical issues as well as call consult as needed.   Reviewed labs ,will order as needed.  CSW will start working on disposition.  Patient to participate in therapeutic milieu .       Medical Decision Making:  Review or order clinical lab tests (1), Decision to  obtain old records (1), Established Problem, Worsening (2), Review or order medicine tests (1) and Review of New Medication or Change in Dosage (2)  I certify that inpatient services furnished can reasonably be expected to improve the patient's condition.   Maude Hettich md 2/9/20162:27 PM

## 2014-10-26 NOTE — Progress Notes (Signed)
Patient ID: [name] [name], male   DOB: Jul 07, 1961, 54 y.o.   MRN: [id number]  [facility], 09:30am: DID NOT ATTEND  The focus of this group is to educate the patient on the purpose and policies of crisis stabilization and provide a format to answer questions about their admission.  The group details unit policies and expectations of patients while admitted.

## 2014-10-26 NOTE — Tx Team (Signed)
Initial Interdisciplinary Treatment Plan   PATIENT STRESSORS: Financial difficulties Health problems Legal issue Medication change or noncompliance Substance abuse   PATIENT STRENGTHS: Ability for insight Average or above average intelligence Capable of independent living General fund of knowledge Motivation for treatment/growth   PROBLEM LIST: Problem List/Patient Goals Date to be addressed Date deferred Reason deferred Estimated date of resolution  Depression      Alcohol abuse; using cocaine and marijuana      Homeless      Risk for self harm            "After I detox I want to go to long term treatment"      "I need to get back on my medicine and help being able to get them when I run out"                   DISCHARGE CRITERIA:  Ability to meet basic life and health needs Adequate post-discharge living arrangements Improved stabilization in mood, thinking, and/or behavior Motivation to continue treatment in a less acute level of care Need for constant or close observation no longer present Safe-care adequate arrangements made Verbal commitment to aftercare and medication compliance Withdrawal symptoms are absent or subacute and managed without 24-hour nursing intervention  PRELIMINARY DISCHARGE PLAN: Attend aftercare/continuing care group Attend 12-step recovery group Placement in alternative living arrangements  PATIENT/FAMIILY INVOLVEMENT: This treatment plan has been presented to and reviewed with the patient, [name] [name], and/or family member.  The patient and family have been given the opportunity to ask questions and make suggestions.  [name] [name], [name] [name] [facility] 10/26/2014, 6:17 AM

## 2014-10-26 NOTE — ED Notes (Addendum)
[facility]. Pt going to [facility].

## 2014-10-26 NOTE — [facility] Suicide Risk Assessment (Signed)
[facility] INPATIENT:  Family/Significant Other Suicide Prevention Education  Suicide Prevention Education: Patient states he does not want anyone contacted re his hospitalization or for suicide prevention education. Patient will be given pamphlet and risk factors and crisis numbers provided.   Patient Refusal for Family/Significant Other Suicide Prevention Education: The patient [name] [name] has refused to provide written consent for family/significant other to be provided Family/Significant Other Suicide Prevention Education during admission and/or prior to discharge.  Physician notified.  Sallee LangeCunningham, Lakrisha Iseman C 10/26/2014, 1:06 PM

## 2014-10-26 NOTE — Clinical Social Work Note (Signed)
Care coordinator - Sabino SnipesKimberly O'Neill [phone]).  Is [employer] funded from [employer].  CSW will await call from CC.   Santa GeneraAnne Tamya Denardo, LCSW Clinical Social Worker

## 2014-10-26 NOTE — Progress Notes (Signed)
D: Patient is alert and oriented. Pt's mood and affect is depressed, sad, and flat. Pt's eye contact is brief. Pt reports passive SI. Pt denies HI and AVH. Pt uses walker to ambulate. Pt hypertensive this morning (See docflowsheet-vitals). Pt complains of withdrawal symptoms including shakiness and sweatiness. Pt not attending groups. A: Will reassess vitals frequently. Active listening by RN. Encouragement/Support provided to pt. Scheduled medications administered per providers orders (See MAR). 15 minute checks continued per protocol for patient safety.  R: Patient cooperative and receptive to nursing interventions. Pt remains safe.

## 2014-10-26 NOTE — [facility] Suicide Risk Assessment (Signed)
[facility]   Nursing information obtained from:  Patient, Review of record Demographic factors:  Male, Caucasian, Low socioeconomic status, Living alone, Unemployed (homeless) Current Mental Status:  Self-harm thoughts Loss Factors:  Legal issues, Financial problems / change in socioeconomic status Historical Factors:  Prior suicide attempts, Family history of mental illness or substance abuse Risk Reduction Factors:  NA Total Time spent with patient: 30 minutes Principal Problem: Substance or medication-induced bipolar and related disorder with onset during intoxication Diagnosis:   Patient Active Problem List   Diagnosis Date Noted  . 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 Alcohol use disorder, severe, dependence [F10.20] 10/26/2014  . Alcohol withdrawal [F10.239] 10/26/2014  . Cannabis use disorder, severe, dependence [F12.20] 10/26/2014  . Gastrointestinal bleeding, lower [K92.2] 02/14/2014     Continued Clinical Symptoms:  Alcohol Use Disorder Identification Test Final Score (AUDIT): 38 The "Alcohol Use Disorders Identification Test", Guidelines for Use in Primary Care, Second Edition.  World Science writerHealth Organization [facility](WHO). Score between 0-7:  no or low risk or alcohol related problems. Score between 8-15:  moderate risk of alcohol related problems. Score between 16-19:  high risk of alcohol related problems. Score 20 or above:  warrants further diagnostic evaluation for alcohol dependence and treatment.   CLINICAL FACTORS:   Alcohol/Substance Abuse/Dependencies   Musculoskeletal: Strength & Muscle Tone: within normal limits Gait & Station: normal Patient leans: N/A  Psychiatric Specialty Exam: Physical Exam  ROS  Blood pressure 108/90, pulse 89, temperature 98.2 F (36.8 C), temperature source Oral, resp. rate 17, height 6\' 1"  (1.854 m), weight 70.761 kg (156 lb).Body  mass index is 20.59 kg/(m^2).    Please see H&P.                                                        SUICIDE RISK:   Mild:  Suicidal ideation of limited frequency, intensity, duration, and specificity.  There are no identifiable plans, no associated intent, mild dysphoria and related symptoms, good self-control (both objective and subjective assessment), few other risk factors, and identifiable protective factors, including available and accessible social support.  PLAN OF CARE: Please see H&P.   Medical Decision Making:  New problem, with additional work up planned, Review of Psycho-Social Stressors (1), Review or order clinical lab tests (1), Established Problem, Worsening (2), Review or order medicine tests (1), Review of Medication Regimen & Side Effects (2) and Review of New Medication or Change in Dosage (2)  I certify that inpatient services furnished can reasonably be expected to improve the patient's condition.   Hilery Wintle MD 10/26/2014, 1:19 PM

## 2014-10-26 NOTE — BHH Counselor (Signed)
Adult Comprehensive Assessment  Patient ID: [name] [name], male   DOB: [birth year], 54 y.o.   MRN: [id number]  Information Source: Information source: Patient  Current Stressors:  Educational / Learning stressors: 8th grade graduate Employment / Job issues: unemployed at present, formerly employed as Education administrator Family Relationships: parents deceased, estranged from brother and sister Surveyor, quantity / Lack of resources (include bankruptcy): No income Housing / Lack of housing: Homeless, lives in tent Physical health (include injuries & life threatening diseases): Per record, had hip surgery and has peripheral neuropathy Social relationships: Limited Substance abuse: Has drunk 1/5 of gin/day for past five years Bereavement / Loss: Parents deceased  Living/Environment/Situation:  Living Arrangements: Alone Living conditions (as described by patient or guardian): Lives in tent in [place], has one other person living nearby, does not access shelters How long has patient lived in current situation?: Approx 5 years What is atmosphere in current home: Dangerous, Temporary  Family History:  Marital status: Single Does patient have children?: No  Childhood History:  By whom was/is the patient raised?: Both parents Additional childhood history information: Patient guarded when discussing childhood Description of patient's relationship with caregiver when they were a child: Loving towards his mother, she is deceased Patient's description of current relationship with people who raised him/her: Both parents dead Does patient have siblings?: Yes Number of Siblings: 2 Description of patient's current relationship with siblings: Patient does not have contact w either sibling - both live in [place].   Did patient suffer any verbal/emotional/physical/sexual abuse as a child?: No Did patient suffer from severe childhood neglect?: No Has patient ever been sexually abused/assaulted/raped as an  adolescent or adult?: No Was the patient ever a victim of a crime or a disaster?: No Witnessed domestic violence?: No Has patient been effected by domestic violence as an adult?: No  Education:  Highest grade of school patient has completed: 8th grade education Currently a student?: No Name of school: Graduated from [employer]  Employment/Work Situation:   Employment situation: Unemployed Patient's job has been impacted by current illness: Yes Describe how patient's job has been impacted: Patient became depressed and 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 What is the longest time patient has a held a job?: up until 5 years ago 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 Has patient ever been in the [employer]?: No Has patient ever served in Buyer, retail?: No  Financial Resources:   Financial resources: No income Does patient have a Lawyer or guardian?: No  Alcohol/Substance Abuse:   What has been your use of drugs/alcohol within the last 12 months?: States he drinks fifth of gin/day; used crack cocaine but "quit" If attempted suicide, did drugs/alcohol play a role in this?: No Alcohol/Substance Abuse Treatment Hx: Past Tx, Inpatient If yes, describe treatment: States he has been treated at [facility], [facility] and other facilities he could not name - says none were effective and "nothing changed", felt these programs were too short term to have any effect on him Has alcohol/substance abuse ever caused legal problems?: No  Social Support System:   Forensic psychologist System: Poor Describe Community Support System: Few - no community supports, no family support Type of faith/religion: none How does patient's faith help to cope with current illness?: n/a  Leisure/Recreation:   Leisure and Hobbies: none  Strengths/Needs:      Discharge Plan:   Does patient have access to transportation?: No Plan for no  access to transportation at discharge: Will provide bus pass for discharge, will attempt to link patient w community provider if possible Will patient be returning to same living situation after discharge?: Yes (Patient states he wants long term SA treatment, does not want to return to tent but  has no other identificable alternatives at present) Currently receiving community mental health services: No If no, would patient like referral for services when discharged?: Yes (What county?) (Wants long term SA treatment,no insurance) Does patient have financial barriers related to discharge medications?: Yes Patient description of barriers related to discharge medications: Has no income or insurance, has been denied for disability and Medicaid 3 - 4 times per patient  Summary/Recommendations:   Summary and Recommendations (to be completed by the evaluator): Patient is noticably tremulous, initially irritable "Why is everyone asking me all these questions?"  Agreed to meet w CSW, expressed that he is "tired" of his current lifestyle of drinking, feels he cannot stop, has no recovery supports in place, has been to several rehabs but has not been able to maintain sobriety at discharge.  When asked why he is ready to stop drinking now, he states that he is simply "tired" of drinking and "getting older."  Patient has little in terms of social support, is estranged from his siblings and wants no contact w them, parents are deceased, does not access any supportive services in [place].  Patient states that he is willing to go to longterm treatment program.   (Patient is a 54 year old Caucasian male, admitted for alcohol detox and suicidal thinking with suicide attempt by trying to cut his wrists several days ago.  Approx 2 - 3 weeks ago, he [facility],discharged to tent)   Patient will benefit from hospitalization to receive alcohol detox services,psychoeducation and group  therapy services to increase coping skills for and understanding of substance dependence, milieu therapy, medications management, and nursing support.  Patient will develop appropriate coping skills for dealing w stress,stabilize on any needed medications, and develop greater insight into and acceptance of his addiction.  CSWs will develop discharge plan to include family referral to appropriate after care services, including long term substance use treatment program if possible and to agencies providing assistance w filing for disability for people experiencing homelessness if possible. Sallee Langeunningham, Amaury Kuzel C. 10/26/2014

## 2014-10-26 NOTE — ED Provider Notes (Signed)
Patient has been [facility] by Dr. Elna BreslowEappen.   Dione Boozeavid Aalivia Mcgraw, MD 10/26/14 [phone]

## 2014-10-26 NOTE — ED Notes (Signed)
Pt has departed with [facility].

## 2014-10-27 MED ORDER — BENZTROPINE MESYLATE 1 MG PO TABS
1.0000 mg | ORAL_TABLET | Freq: Four times a day (QID) | ORAL | Status: DC | PRN
  Administered 2014-10-28: 1 mg via ORAL
  Filled 2014-10-27: qty 1

## 2014-10-27 MED ORDER — GABAPENTIN 100 MG PO CAPS
200.0000 mg | ORAL_CAPSULE | Freq: Three times a day (TID) | ORAL | Status: DC
  Administered 2014-10-27 – 2014-10-28 (×3): 200 mg via ORAL
  Filled 2014-10-27 (×5): qty 2

## 2014-10-27 MED ORDER — HALOPERIDOL 5 MG PO TABS
5.0000 mg | ORAL_TABLET | Freq: Four times a day (QID) | ORAL | Status: DC | PRN
  Administered 2014-10-27 – 2014-10-28 (×2): 5 mg via ORAL
  Filled 2014-10-27 (×2): qty 1

## 2014-10-27 MED ORDER — ENSURE COMPLETE PO LIQD
237.0000 mL | Freq: Three times a day (TID) | ORAL | Status: DC
  Administered 2014-10-27 – 2014-11-02 (×12): 237 mL via ORAL

## 2014-10-27 NOTE — Progress Notes (Deleted)
PT Cancellation Note  Patient Details Name: [name] GraterMichael Hudman MRN: [id number] DOB: 1961-09-14   Cancelled Treatment:    Reason Eval/Treat Not Completed: Other (comment) Spoke with pt's nurse who stated pt getting ready to be discharged. She did ask the patient is he had any mobility concerns for us to address prior to his discharge and he stated he uses a cane at home and he should be fine from that standpoint. We will sign off at this time due to pt being discharged very soon.   Thank you,  Marella BileSharron Sael Furches, PT Pager: [phone] 10/27/2014     Marella BileBRITT, Farheen Pfahler 10/27/2014, 2:30 PM

## 2014-10-27 NOTE — Progress Notes (Deleted)
Error entry

## 2014-10-27 NOTE — Progress Notes (Signed)
D: Pr irritable on approach. Pt noted to be easily agitated. Pt initially refused meds from writer this morning after cursing and threatening to hurt someone while standing at the med window. Writer asked pt what was causing him to be upset and pt yelled at Emerson Electricwriter and walked off. After speaking with MD, pt agreed to take meds. Pt apologized to Clinical research associatewriter and stated that he was upset this morning because staff had him line up for breakfast and then pulled him out of line for meds. This caused him to line up at the back of the line. Pt denies having any suicidal thoughts. Pt denies any hallucinations. Pt reported having withdrawal symptoms of sweats and tremors. Pt a & oriented x 4. Pt has minimal interaction and spends most of the day in his room.  A: medications administered as ordered per MD. Verbal support given. Pt encouraged to attend groups. 15 minute checks performed for safety.  R: Pt cautious and verbally aggressive when irritable. Pt safety  Maintained.

## 2014-10-27 NOTE — [facility] Group Notes (Signed)
[facility] After[facility] Discharge Planning [facility] Note   10/27/2014 11:19 AM  Participation Quality:  Invited Declined to come due to withdrawal symptoms    Ida Rogueorth, Byrd Rushlow B

## 2014-10-27 NOTE — Tx Team (Signed)
  Interdisciplinary Treatment Plan Update   Date Reviewed:  10/27/2014  Time Reviewed:  11:22 AM  Progress in Treatment:   Attending groups: No Participating in groups: No Taking medication as prescribed: Yes  Tolerating medication: Yes Family/Significant other contact made: No Patient understands diagnosis: Yes AEB asking for help with alcohol withdrawal; referral to rehab Discussing patient identified problems/goals with staff: Yes  See initial care plan Medical problems stabilized or resolved: Yes Denies suicidal/homicidal ideation: Yes  In tx team Patient has not harmed self or others: Yes  For review of initial/current patient goals, please see plan of care.  Estimated Length of Stay:  4-5 days  Reason for Continuation of Hospitalization: Depression Medication stabilization Withdrawal symptoms  New Problems/Goals identified:  N/A  Discharge Plan or Barriers:   Hoping to get into rehab, half way house  Additional Comments:  [name] [name] is a 54 y.o. C male who [facility] with SI/depression and requesting alcohol, cocaine and marijuana detox.  Patient seen this AM. Patient reports that he presented to the ED for receiving detox for his alcohol abuse. Patient reports having withdrawal sx like tremors as well as sweating and runny nose. Patient reports having SI . Patient had a plan to cut self. Patient also reports mood swings which he feels is due to his alcohol abuse and happens when he abuses drugs.  Pt also admits drinking 1/5 of liquor and 3-4 24oz beers daily, his last drink was 2-3 days ago and he drank 1/5 of liquor and 3-4 24oz beers. He consumes a $20 bag of cocaine on a monthly basis and says his last use was 4-5 days ago and he used a $20 bag. Pt smokes 2 marijuana "blunts" and says his last use was 10/22/14 and he smoked 2 "blunts".  He admits he has up coming court date on 12/2014 for shoplifting([employer]). Patient is homeless. Patient is  unemployed.  [facility] detox protocol.  [facility]   Attendees:  Signature: Ivin BootySarama Eappen, MD 10/27/2014 11:22 AM   Signature: Richelle Itood Traniya Prichett, LCSW 10/27/2014 11:22 AM  Signature: 10/27/2014 11:22 AM  Signature:  10/27/2014 11:22 AM  Signature: Liborio NixonPatrice White, RN 10/27/2014 11:22 AM  Signature:  10/27/2014 11:22 AM  Signature:   10/27/2014 11:22 AM  Signature:    Signature:    Signature:    Signature:    Signature:    Signature:      Scribe for Treatment Team:   Richelle Itood Abram Sax, LCSW  10/27/2014 11:22 AM

## 2014-10-27 NOTE — B[facility] Group Notes (Signed)
B[facility] LCSW Group Therapy  10/27/2014 1:24 PM  Type of Therapy:  Group Therapy  Participation Level:  Did Not Attend  Modes of Intervention:  Discussion, Socialization and Support  Summary of Progress/Problems:Mental Health Association [facility](MHA) speaker came to talk about his personal journey with substance abuse and mental illness. Group members were challenged to process ways by which to relate to the speaker. MHA speaker provided handouts and educational information pertaining to groups and services offered by the [facility].   [name],[name] 10/27/2014, 1:24 PM

## 2014-10-27 NOTE — Tx Team (Deleted)
Interdisciplinary Treatment Plan Update (Adult)  Date:  10/27/2014 Time Reviewed:  9:12 AM  Progress in Treatment: Attending groups: No. Participating in groups:  No.  States he does not feel well enough to participate Taking medication as prescribed:  Yes. Tolerating medication:  Yes. Family/Significant othe contact made:  No, will contact:  patient states he does not have any family/friends that he relys on for support, refused contact Patient understands diagnosis:  Yes. Patient states he is an alcoholic and wants to remain sober Discussing patient identified problems/goals with staff:  Yes.Discussed his desire to go to 28 day program w CSW. Medical problems stabilized or resolved:  Yes. None identified by MD Denies suicidal/homicidal ideation: Yes. Per patient report and denies to CSW Issues/concerns per patient self-inventory:  Yes. Discharge planning and disability application assistance requested by patient.   Other:  New problem(s) identified: Yes, Describe:  court date, lack of insurance, limited resources, wants help w disability application, referred to financial counseling for assessment  Discharge Plan or Barriers:  Patient is homeless and lives in tent in [place], does not access any support services for homeless individuals.  Per CSW, has pending court date in March 2016.  No insurance coverage.  Care coordinator contacted to discuss possible referrals for long term substance abuse treatment, patient not attending groups, states he is "too sick" attend.  CSW will continue to pursue placement options if patient continues to desire help w continued abstinence from alcohol, cocaine and marijuana at discharge.    Reason for Continuation of Hospitalization: Medical Issues Medication stabilization Withdrawal symptoms  Comments:  [name] [name] is a 54 y.o. C male who voluntarily presents to [facility] with SI/depression and requesting alcohol, cocaine and marijuana detox.    Patient seen this AM. Patient reports that he presented to the ED for receiving detox for his alcohol abuse. Patient reports having withdrawal sx like tremors as well as sweating and runny nose. Patient reports having SI . Patient had a plan to cut self. Patient also reports mood swings which he feels is due to his alcohol abuse and happens when he abuses drugs.    Pt also admits drinking 1/5 of liquor and 3-4 24oz beers daily, his last drink was 2-3 days ago and he drank 1/5 of liquor and 3-4 24oz beers. He consumes a $20 bag of cocaine on a monthly basis and says his last use was 4-5 days ago and he used a $20 bag. Pt smokes 2 marijuana "blunts" and says his last use was 10/22/14 and he smoked 2 "blunts".    He admits he has up coming court date on 12/2014 for shoplifting([employer]).  Patient is homeless. Patient is unemployed.     Estimated length of stay: 3 - 5 days  New goal(s):  Referral to long term SA treatment program, clarification and reinforcement of desire for sobriety.  Management of alcohol withdrawal symptoms.    Review of initial/current patient goals per problem list:    Attendees: Patient:   2/10/20169:12 AM  Family:   2/10/20169:12 AM  Physician:  Noel Christmas MD 2/10/20169:12 AM  Nursing:   Rodell Perna RN 2/10/20169:12 AM  Case Manager:  Richelle Ito LCSW 2/10/20169:12 AM  Counselor:   2/10/20169:12 AM  Other:  Santa Genera LCSW 2/10/20169:12 AM  Other:  Liliane Bade, P4CC 2/10/20169:12 AM  Other:  Cherly Hensen TCT 2/10/20169:12 AM  Other: Victorino Dike RN UR 2/10/20169:12 AM  Other:  2/10/20169:12 AM  Other:  2/10/20169:12 AM  Other:  2/10/20169:12 AM  Other:  2/10/20169:12 AM  Other:  2/10/20169:12 AM  Other:   2/10/20169:12 AM   Scribe for Treatment Team:   Sallee Langeunningham, Malone Vanblarcom C, 10/27/2014, 9:12 AM

## 2014-10-27 NOTE — Progress Notes (Signed)
[facility] MD Progress Note  10/27/2014 3:20 PM [name] [name]  MRN:  [id number] Subjective: Patient states " I am upset , I went and stood there for a long time to get my medications , but they did not give it , I hate this place."   Objective;Patient seen and chart reviewed.Pt appears to be very loud and agitated , yelling ,refusing medications this AM , reporting that he had to wait in line to get it.Pt with continued anxiety sx as well as mood lability. Pt is refusing to participate any more in the evaluation. Pt with hx of  Recent DT s during his most recent inpatient psychiatric admission. Pt encouraged to take his Librium for his withdrawal sx.Reviewed VS as well as CIWA.    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 Alcohol use disorder, severe, dependence [F10.20] 10/26/2014  . Alcohol withdrawal [F10.239] 10/26/2014  . Cannabis use disorder, severe, dependence [F12.20] 10/26/2014  . Gastrointestinal bleeding, lower [K92.2] 02/14/2014   Total Time spent with patient: 30 minutes   Past Medical History:  Past Medical History  Diagnosis Date  . Alcoholic   . Peripheral neuropathy   . Depression   . Anxiety     Past Surgical History  Procedure Laterality Date  . Hip surgery     Family History: History reviewed. No pertinent family history. Social History:  History  Alcohol Use  . 0.0  oz/week    Comment: 1/5 daily     History  Drug Use  . Yes  . Special: Cocaine, Marijuana    History   Social History  . Marital Status: Single    Spouse Name: N/A  . Number of Children: N/A  . Years of Education: N/A   Social History Main Topics  . Smoking status: Current Every Day Smoker -- 1.00 packs/day for 15 years    Types: Cigarettes  . Smokeless tobacco: Not on file  . Alcohol Use: 0.0 oz/week     Comment: 1/5 daily  . Drug Use: Yes    Special: Cocaine, Marijuana  . Sexual Activity: No   Other Topics Concern  . None   Social History Narrative   Additional History:    Sleep: PT DID NOT REPORT ANY ISSUES  Appetite:  PT REFUSES TO ANSWER     Musculoskeletal: Strength & Muscle Tone: within normal limits Gait & Station: Walks with walker Patient leans: N/A   Psychiatric Specialty Exam: Physical Exam  Review of Systems  Constitutional: Positive for malaise/fatigue and diaphoresis.  HENT: Negative.   Eyes: Negative.   Respiratory: Negative.   Cardiovascular: Negative.   Gastrointestinal: Negative.   Genitourinary: Negative.   Musculoskeletal: Positive for myalgias and joint pain.  Skin: Negative.   Neurological: Positive for tremors.  Psychiatric/Behavioral: Positive for depression and substance abuse. The patient is nervous/anxious.     Blood pressure 126/88, pulse 95, temperature 97.7 F (36.5 C), temperature source Oral, resp. rate 18, height 6' 1"  (1.854 m), weight 70.761 kg (156 lb).Body mass index is 20.59 kg/(m^2).  General  Appearance: Fairly Groomed  Engineer, water::  Fair  Speech:  Normal Rate  Volume:  Increased  Mood:  Anxious and Irritable  Affect:  Labile  Thought Process:  Coherent  Orientation:  Other:  to place and situation  Thought Content:  Rumination  Suicidal Thoughts:  did not express any  Homicidal Thoughts:  did not express any  Memory:  Immediate;   Fair Recent;   Fair Remote;   Fair  Judgement:  Impaired  Insight:   Shallow  Psychomotor Activity:  Restlessness  Concentration:  Poor  Recall:  [employer] of Knowledge:Fair  Language: Fair  Akathisia:  No  Handed:  Right  AIMS (if indicated):     Assets:  Communication Skills  ADL's:  Intact  Cognition: WNL  Sleep:  Number of Hours: 6.75     Current Medications: Current Facility-Administered Medications  Medication Dose Route Frequency Provider Last Rate Last Dose  . acetaminophen (TYLENOL) tablet 650 mg  650 mg Oral Q6H PRN Ursula Alert, MD      . alum & mag hydroxide-simeth (MAALOX/MYLANTA) 200-200-20 MG/5ML suspension 30 mL  30 mL Oral Q4H PRN Laverle Hobby, PA-C      . haloperidol (HALDOL) tablet 5 mg  5 mg Oral Q6H PRN Ursula Alert, MD       And  . benztropine (COGENTIN) tablet 1 mg  1 mg Oral Q6H PRN Ursula Alert, MD      . chlordiazePOXIDE (LIBRIUM) capsule 25 mg  25 mg Oral Q6H PRN Laverle Hobby, PA-C      . chlordiazePOXIDE (LIBRIUM) capsule 25 mg  25 mg Oral TID Laverle Hobby, PA-C   25 mg at 10/27/14 1202   Followed by  . [START ON 10/28/2014] chlordiazePOXIDE (LIBRIUM) capsule 25 mg  25 mg Oral BH-qamhs Spencer E Simon, PA-C       Followed by  . [START ON 10/29/2014] chlordiazePOXIDE (LIBRIUM) capsule 25 mg  25 mg Oral Daily Laverle Hobby, PA-C   25 mg at 10/27/14 0848  . gabapentin (NEURONTIN) capsule 200 mg  200 mg Oral TID Ursula Alert, MD      . hydrOXYzine (ATARAX/VISTARIL) tablet 25 mg  25 mg Oral Q6H PRN Laverle Hobby, PA-C      . loperamide (IMODIUM) capsule 2-4 mg  2-4 mg Oral PRN Laverle Hobby, PA-C      . magnesium hydroxide (MILK OF MAGNESIA) suspension 30 mL  30 mL Oral Daily PRN Laverle Hobby, PA-C      . multivitamin with minerals tablet 1 tablet  1 tablet Oral Daily Laverle Hobby, PA-C   1 tablet at 10/27/14 0848  . nicotine (NICODERM CQ - dosed in mg/24 hours) patch 14 mg  14 mg Transdermal Daily Laverle Hobby, PA-C   14 mg at 10/27/14 3664  . ondansetron (ZOFRAN-ODT) disintegrating tablet 4 mg   4 mg Oral Q6H PRN Laverle Hobby, PA-C      . potassium chloride SA (K-DUR,KLOR-CON) CR tablet 20 mEq  20 mEq Oral BID Laverle Hobby, PA-C   20 mEq at 10/27/14 0849  . thiamine (B-1) injection 100 mg  100 mg Intramuscular Once Laverle Hobby, PA-C   100 mg at 10/26/14 0500  . thiamine (VITAMIN B-1) tablet 100 mg  100 mg Oral Daily Laverle Hobby, PA-C   100 mg at 10/27/14 0848  . traMADol (ULTRAM) tablet 50 mg  50 mg Oral Q12H Ursula Alert, MD   50  mg at 10/27/14 0848  . traZODone (DESYREL) tablet 50 mg  50 mg Oral QHS,MR X 1 Laverle Hobby, PA-C   50 mg at 10/26/14 2121    Lab Results:  Results for orders placed or performed during the hospital encounter of 10/25/14 (from the past 48 hour(s))  CBC     Status: Abnormal   Collection Time: 10/25/14  6:00 PM  Result Value Ref Range   WBC 5.4 4.0 - 10.5 K/uL   RBC 3.92 (L) 4.22 - 5.81 MIL/uL   Hemoglobin 12.8 (L) 13.0 - 17.0 g/dL   HCT 38.0 (L) 39.0 - 52.0 %   MCV 96.9 78.0 - 100.0 fL   MCH 32.7 26.0 - 34.0 pg   MCHC 33.7 30.0 - 36.0 g/dL   RDW 14.2 11.5 - 15.5 %   Platelets 234 150 - 400 K/uL  Comprehensive metabolic panel     Status: Abnormal   Collection Time: 10/25/14  6:00 PM  Result Value Ref Range   Sodium 136 135 - 145 mmol/L   Potassium 3.3 (L) 3.5 - 5.1 mmol/L   Chloride 101 96 - 112 mmol/L   CO2 28 19 - 32 mmol/L   Glucose, Bld 97 70 - 99 mg/dL   BUN 5 (L) 6 - 23 mg/dL   Creatinine, Ser 0.76 0.50 - 1.35 mg/dL   Calcium 8.8 8.4 - 10.5 mg/dL   Total Protein 7.2 6.0 - 8.3 g/dL   Albumin 3.6 3.5 - 5.2 g/dL   AST 38 (H) 0 - 37 U/L   ALT 27 0 - 53 U/L   Alkaline Phosphatase 100 39 - 117 U/L   Total Bilirubin 0.9 0.3 - 1.2 mg/dL   GFR calc non Af Amer >90 >90 mL/min   GFR calc Af Amer >90 >90 mL/min    Comment: (NOTE) The eGFR has been calculated using the CKD EPI equation. This calculation has not been validated in all clinical situations. eGFR's persistently <90 mL/min signify possible Chronic Kidney Disease.     Anion gap 7 5 - 15  Ethanol (ETOH)     Status: None   Collection Time: 10/25/14  6:00 PM  Result Value Ref Range   Alcohol, Ethyl (B) <5 0 - 9 mg/dL    Comment:        LOWEST DETECTABLE LIMIT FOR SERUM ALCOHOL IS 11 mg/dL FOR MEDICAL PURPOSES ONLY   Acetaminophen level     Status: Abnormal   Collection Time: 10/25/14  6:00 PM  Result Value Ref Range   Acetaminophen (Tylenol), Serum <10.0 (L) 10 - 30 ug/mL    Comment:        THERAPEUTIC CONCENTRATIONS VARY SIGNIFICANTLY. A RANGE OF 10-30 ug/mL MAY BE AN EFFECTIVE CONCENTRATION FOR MANY PATIENTS. HOWEVER, SOME ARE BEST TREATED AT CONCENTRATIONS OUTSIDE THIS RANGE. ACETAMINOPHEN CONCENTRATIONS >150 ug/mL AT 4 HOURS AFTER INGESTION AND >50 ug/mL AT 12 HOURS AFTER INGESTION ARE OFTEN ASSOCIATED WITH TOXIC REACTIONS.   Salicylate level     Status: None   Collection Time: 10/25/14  6:00 PM  Result Value Ref Range   Salicylate Lvl <6.3 2.8 - 20.0 mg/dL  Urine rapid drug screen (hosp performed)     Status: Abnormal   Collection Time: 10/25/14  7:56 PM  Result Value Ref Range   Opiates NONE DETECTED NONE DETECTED   Cocaine NONE DETECTED NONE DETECTED   Benzodiazepines POSITIVE (A) NONE DETECTED   Amphetamines NONE DETECTED NONE DETECTED   Tetrahydrocannabinol POSITIVE (A) NONE DETECTED  Barbiturates NONE DETECTED NONE DETECTED    Comment:        DRUG SCREEN FOR MEDICAL PURPOSES ONLY.  IF CONFIRMATION IS NEEDED FOR ANY PURPOSE, NOTIFY LAB WITHIN 5 DAYS.        LOWEST DETECTABLE LIMITS FOR URINE DRUG SCREEN Drug Class       Cutoff (ng/mL) Amphetamine      1000 Barbiturate      200 Benzodiazepine   569 Tricyclics       794 Opiates          300 Cocaine          300 THC              50     Physical Findings: AIMS: Facial and Oral Movements Muscles of Facial Expression: None, normal Lips and Perioral Area: None, normal Jaw: None, normal Tongue: None, normal,Extremity Movements Upper (arms, wrists, hands,  fingers): None, normal Lower (legs, knees, ankles, toes): None, normal, Trunk Movements Neck, shoulders, hips: None, normal, Overall Severity Severity of abnormal movements (highest score from questions above): None, normal Incapacitation due to abnormal movements: None, normal, Dental Status Current problems with teeth and/or dentures?: No Does patient usually wear dentures?: No  CIWA:  CIWA-Ar Total: 6 COWS:     Assessment: Pt presented with request to get detoxification. Patient presented with severe withdrawal sx as well as recent DTs during his recent admission to inpatient facility elsewhere (2 weeks ago)the patient will need continued treatment.   Treatment Plan Summary: Daily contact with patient to assess and evaluate symptoms and progress in treatment and Medication management Will continue CIWA/librium protocol. Will increase Gabapentin to 200 mg po tid. Will continue Trazodone 50 mg po qhs prn. Will provide pain management for sprained ankle - right sided- mild swelling- no tenderness. Reports Xray done 2 weeks ago was negative.   Will continue to monitor vitals ,medication compliance and treatment side effects while patient is here.  Will monitor for medical issues as well as call consult as needed  CSW will work on disposition , pt to be referred to a residential drug program.  Medical Decision Making:  Review of Psycho-Social Stressors (1), Review or order clinical lab tests (1), Established Problem, Worsening (2), Review of Last Therapy Session (1), Review or order medicine tests (1), Review of Medication Regimen & Side Effects (2) and Review of New Medication or Change in Dosage (2)     Celenia Hruska MD 10/27/2014, 3:20 PM

## 2014-10-27 NOTE — Plan of Care (Signed)
Problem: Alteration in mood & ability to function due to Goal: STG-Patient will comply with prescribed medication regimen (Patient will comply with prescribed medication regimen)  Outcome: Progressing Pt compliant with schedule medication regimen

## 2014-10-28 DIAGNOSIS — S93409A Sprain of unspecified ligament of unspecified ankle, initial encounter: Secondary | ICD-10-CM | POA: Diagnosis present

## 2014-10-28 DIAGNOSIS — R45851 Suicidal ideations: Secondary | ICD-10-CM

## 2014-10-28 MED ORDER — GABAPENTIN 300 MG PO CAPS
300.0000 mg | ORAL_CAPSULE | Freq: Three times a day (TID) | ORAL | Status: DC
  Administered 2014-10-28 – 2014-11-02 (×15): 300 mg via ORAL
  Filled 2014-10-28 (×17): qty 1

## 2014-10-28 MED ORDER — GUAIFENESIN ER 600 MG PO TB12
600.0000 mg | ORAL_TABLET | Freq: Two times a day (BID) | ORAL | Status: DC | PRN
  Administered 2014-10-28 – 2014-10-29 (×2): 600 mg via ORAL
  Filled 2014-10-28 (×2): qty 1

## 2014-10-28 MED ORDER — TUBERCULIN PPD 5 UNIT/0.1ML ID SOLN
5.0000 [IU] | Freq: Once | INTRADERMAL | Status: AC
  Administered 2014-10-28: 5 [IU] via INTRADERMAL

## 2014-10-28 NOTE — [facility] Group Notes (Signed)
[facility] Group Notes:  (Counselor/Nursing/MHT/Case Management/Adjunct)  10/28/2014 1:15PM  Type of Therapy:  Group Therapy  Participation Level:  Active  Participation Quality:  Appropriate  Affect:  Flat  Cognitive:  Oriented  Insight:  Improving  Engagement in Group:  Limited  Engagement in Therapy:  Limited  Modes of Intervention:  Discussion, Exploration and Socialization  Summary of Progress/Problems: The topic for group was balance in life.  Pt participated in the discussion about when their life was in balance and out of balance and how this feels.  Pt discussed ways to get back in balance and short term goals they can work on to get where they want to be. [name] NeedleMichael talked about what drinking has done.  "I've lost everything I ever had.  All I have left is this t-shirt and these pants.  But my liver is OK, amazingly, and I'm still alive.  I need to get to a long term place where I can stay or I'm going to end up in a pine box.  I had a woman die in my tent once because she had cirrhosis of the liver."  Stayed for the entire group, and was invested throughout.   [name] [name], [name] [name] 10/28/2014 12:57 PM

## 2014-10-28 NOTE — Progress Notes (Signed)
The focus of this group is to educate the patient on the purpose and policies of crisis stabilization and provide a format to answer questions about their admission.  The group details unit policies and expectations of patients while admitted. Patient did not attend this group. 

## 2014-10-28 NOTE — Progress Notes (Signed)
NUTRITION ASSESSMENT  Pt identified as at risk on the Malnutrition Screen Tool  INTERVENTION: 1. Educated patient on the importance of nutrition and encouraged intake of food and beverages. 2. Discussed weight goals. 3. Supplements: Continue Ensure Complete po TID, each supplement provides 350 kcal and 13 grams of protein  NUTRITION DIAGNOSIS: Increased nutrient needs related to ETOH abuse as evidenced by estimated nutritional needs  Goal: Pt to meet >/= 90% of their estimated nutrition needs.  Monitor:  PO intake  Assessment:  Pt admitted with acohol and substance abuse. On CIWA protocol.  Pt's weight has been stable.  Per H&P, pt drinks 1/5 of ETOH daily.Pt with increased nutrient needs d/t alcohol use. Pt has been ordered Ensure supplements TID.  Height: Ht Readings from Last 1 Encounters:  10/26/14 6\' 1"  (1.854 m)    Weight: Wt Readings from Last 1 Encounters:  10/26/14 156 lb (70.761 kg)    Weight Hx: Wt Readings from Last 10 Encounters:  10/26/14 156 lb (70.761 kg)  05/27/11 150 lb (68.04 kg)    BMI:  Body mass index is 20.59 kg/(m^2). Pt meets criteria for normal range based on current BMI.  Estimated Nutritional Needs: Kcal: 30-35 kcal/kg Protein: > 1 gram protein/kg Fluid: 1 ml/kcal  Diet Order: Diet regular Pt is also offered choice of unit snacks mid-morning and mid-afternoon.  Pt is eating as desired.   Lab results and medications reviewed.   Tilda FrancoLindsey Christianjames Soule, MS, RD, LDN Pager: [phone] After Hours Pager: [phone]

## 2014-10-28 NOTE — Progress Notes (Addendum)
D: Pt verbalizes having some anxiety r/t going to his "new place" (rehab) on Monday. Pt presents flat in affect and anxious in mood. Pt actively participated within the milieu this evening. Pt negative for any SI/HI/AVH. Pt's CIWA of 6 for emesis, tremors, and anxiety.  A: Writer administered scheduled and prn medications to pt, per MD orders. Continued support and availability as needed was extended to this pt. Staff continue to monitor pt with q2915min checks.  R: No adverse drug reactions noted. Pt receptive to treatment. Pt remains safe at this time.

## 2014-10-28 NOTE — Progress Notes (Signed)
PT Cancellation Note  Patient Details Name: [name] GraterMichael Carusone MRN: [id number] DOB: 04-Oct-1960   Cancelled Treatment:    Reason Eval/Treat Not Completed: PT screened, no needs identified, will sign off (pt is ambulating w/RW, adjusted to proper height. Pt reports he usually ambulates w/crutches but unable to use  in facility. no  further PT needs. pt  reports recent R ankle sprain and h/o R hip fracture/.)   Rada HayHill, Kynsli Haapala Elizabeth 10/28/2014, 2:36 PM Blanchard KelchKaren Kahli Mayon PT [phone]

## 2014-10-28 NOTE — Progress Notes (Signed)
[facility] Group Notes:  (Nursing/MHT/Case Management/Adjunct)  Date:  10/28/2014  Time:  10:17 PM  Type of Therapy:  Psychoeducational Skills  Participation Level:  Active  Participation Quality:  Attentive  Affect:  Flat  Cognitive:  Appropriate  Insight:  Appropriate  Engagement in Group:  Engaged  Modes of Intervention:  Education  Summary of Progress/Problems: Patient states that his day "sucked" since he dealt with a lot of coughing and experienced a headache throughout the day. He states that he is grateful for being alive and that he would like to attend a substance abuse meeting in the future. As a theme for the day, his lifestyle change will be to "quit drinking".   [name] [name], [name] S 10/28/2014, 10:17 PM

## 2014-10-28 NOTE — Progress Notes (Signed)
Patient was administered TB Skin test 10/28/14 at 1135 hrs.

## 2014-10-28 NOTE — Progress Notes (Signed)
Patient ID: [name] GraterMichael [name], male   DOB: 09-02-1961, 54 y.o.   MRN: [id number] Came out of the social work group complaining of anxeity and shakiness. CIWA done at 12, and too low to get the Librium, gave him Haldol and Cogentin as allowed on prn order for this complaint. States being around this many people new and difficult for him. Additionally two of the male peers are being loud, and acting out adding to the stress on the unit.

## 2014-10-28 NOTE — Progress Notes (Addendum)
D: Pt presents flat in affect and depressed in mood. Pt endorses SI with a plan to cut. Pt verbally contracts for safety. Pt presents hopeless this evening. "What's next, I was dead last week". Pt discuses how he required to be resuscitate during a recent hospital visit at another facility.Pt also discusses significant family deaths that have occurred. Pt presents with a CIWA of 3 this evening (2-tremors and 1-anxiety). Pt did not attend group. However, pt did spend some time within the milieu this evening.  A: Writer administered scheduled medications to pt, per MD orders. Continued support and availability as needed was extended to this pt. Staff continue to monitor pt with q2515min checks.  R: No adverse drug reactions noted. Pt receptive to treatment. Pt remains safe at this time.   Pt is hoping to getting into a 90-day rehab program in [place] or a similar program.

## 2014-10-28 NOTE — Progress Notes (Signed)
[facility]  10/28/2014 11:47 AM [name] GraterMichael Beachem  MRN:  [id number] Subjective: Patient states " I still have tremors.'   Objective;Patient seen and chart reviewed.Pt today with continued anxiety sx, with some improvement. Pt has labile moods , with periodic outbursts , however that has been improving. Patient today per staff endorsed suicidal thoughts with a plan to cut. Pt is very motivated to get help with his substance abuse and wants to go to a residential facility. Pt with significant hx of ETOH /Cocaine/Cannabis abuse.  Pt also with withdrawal sx like agitation/ anxiety as well as BL tremors. Pt is currently on CIWA protocol. Pt with hx of DT's 2 weeks ago.  Pt is also S/P sprained ankle , walks with the help of walker. He continues to report pain in his ankle. His ankle swelling seems to be improving. Pain noted as 8/10, 10 being worst.    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 Sprained ankle [S93.409A] 10/28/2014  . 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 Alcohol use disorder, severe, dependence [F10.20] 10/26/2014  . Alcohol withdrawal [F10.239] 10/26/2014  . Cannabis use disorder, severe, dependence [F12.20] 10/26/2014  . Gastrointestinal bleeding, lower [K92.2] 02/14/2014   Total Time spent with patient: 30 minutes   Past Medical History:  Past Medical History  Diagnosis Date  . Alcoholic   .  Peripheral neuropathy   . Depression   . Anxiety     Past Surgical History  Procedure Laterality Date  . Hip surgery     Family History: History reviewed. No pertinent family history. Social History:  History  Alcohol Use  . 0.0 oz/week    Comment: 1/5 daily     History  Drug Use  . Yes  . Special: Cocaine, Marijuana    History   Social History  . Marital Status: Single    Spouse Name: N/A  . Number of Children: N/A  . Years of Education: N/A   Social History Main Topics  . Smoking status: Current Every Day Smoker -- 1.00 packs/day for 15 years    Types: Cigarettes  . Smokeless tobacco: Not on file  . Alcohol Use: 0.0 oz/week     Comment: 1/5 daily  . Drug Use: Yes    Special: Cocaine, Marijuana  . Sexual Activity: No   Other Topics Concern  . None   Social History Narrative   Additional History:    Sleep: PT DID NOT REPORT ANY ISSUES  Appetite:  PT REFUSES TO ANSWER     Musculoskeletal: Strength & Muscle Tone: within normal limits Gait & Station: Walks with walker Patient leans: N/A   Psychiatric Specialty Exam: Physical Exam  Review of Systems  HENT: Positive for congestion.   Psychiatric/Behavioral: Positive for depression, suicidal ideas and substance abuse. The patient is nervous/anxious.     Blood pressure 109/81, pulse 102, temperature 97.7 F (36.5 C), temperature source Oral, resp. rate 20, height 6\' 1"  (1.854 m), weight 70.761 kg (156 lb).Body mass index is 20.59 kg/(m^2).  General Appearance: Fairly Groomed  Patent attorneyye Contact::  Fair  Speech:  Normal Rate  Volume:  Normal  Mood:  Anxious and Irritable  Affect:  Labile  Thought Process:  Coherent  Orientation:  Other:  to place and situation  Thought Content:  Rumination  Suicidal Thoughts:  Yes.  with intent/plan  Homicidal Thoughts:  No  Memory:  Immediate;   Fair Recent;   Fair Remote;   Fair  Judgement:  Impaired  Insight:  Shallow  Psychomotor Activity:  Restlessness and  Tremor  Concentration:  Poor  Recall:  [employer] of Knowledge:Fair  Language: Fair  Akathisia:  No  Handed:  Right  AIMS (if indicated):     Assets:  Communication Skills  ADL's:  Intact  Cognition: WNL  Sleep:  Number of Hours: 5.75     Current Medications: Current Facility-Administered Medications  Medication Dose Route Frequency Provider Last Rate Last Dose  . acetaminophen (TYLENOL) tablet 650 mg  650 mg Oral Q6H PRN Jomarie Longs, MD   650 mg at 10/28/14 0832  . alum & mag hydroxide-simeth (MAALOX/MYLANTA) 200-200-20 MG/5ML suspension 30 mL  30 mL Oral Q4H PRN Kerry Hough, PA-C      . haloperidol (HALDOL) tablet 5 mg  5 mg Oral Q6H PRN Jomarie Longs, MD   5 mg at 10/27/14 1709   And  . benztropine (COGENTIN) tablet 1 mg  1 mg Oral Q6H PRN Jomarie Longs, MD      . chlordiazePOXIDE (LIBRIUM) capsule 25 mg  25 mg Oral Q6H PRN Kerry Hough, PA-C      . chlordiazePOXIDE (LIBRIUM) capsule 25 mg  25 mg Oral BH-qamhs Spencer E Simon, PA-C   25 mg at 10/28/14 0834   Followed by  . [START ON 10/29/2014] chlordiazePOXIDE (LIBRIUM) capsule 25 mg  25 mg Oral Daily Kerry Hough, PA-C   25 mg at 10/27/14 0848  . feeding supplement (ENSURE COMPLETE) (ENSURE COMPLETE) liquid 237 mL  237 mL Oral TID BM Lindwood Qua, NP   237 mL at 10/28/14 0901  . gabapentin (NEURONTIN) capsule 300 mg  300 mg Oral TID Jomarie Longs, MD      . guaiFENesin (MUCINEX) 12 hr tablet 600 mg  600 mg Oral BID PRN Kerry Hough, PA-C   600 mg at 10/28/14 0900  . hydrOXYzine (ATARAX/VISTARIL) tablet 25 mg  25 mg Oral Q6H PRN Kerry Hough, PA-C      . loperamide (IMODIUM) capsule 2-4 mg  2-4 mg Oral PRN Kerry Hough, PA-C      . magnesium hydroxide (MILK OF MAGNESIA) suspension 30 mL  30 mL Oral Daily PRN Kerry Hough, PA-C      . multivitamin with minerals tablet 1 tablet  1 tablet Oral Daily Kerry Hough, PA-C   1 tablet at 10/28/14 0835  . nicotine (NICODERM CQ - dosed in mg/24 hours)  patch 14 mg  14 mg Transdermal Daily Kerry Hough, PA-C   14 mg at 10/28/14 0835  . ondansetron (ZOFRAN-ODT) disintegrating tablet 4 mg  4 mg Oral Q6H PRN Kerry Hough, PA-C      . thiamine (B-1) injection 100 mg  100 mg Intramuscular Once Kerry Hough, PA-C   100 mg at 10/26/14 0500  . thiamine (VITAMIN B-1) tablet 100 mg  100 mg Oral Daily Kerry Hough, PA-C   100 mg at 10/28/14 1610  . traMADol (ULTRAM) tablet 50 mg  50 mg Oral Q12H Stellarose Cerny, MD   50 mg  at 10/28/14 0900  . traZODone (DESYREL) tablet 50 mg  50 mg Oral QHS,MR X 1 Kerry Hough, PA-C   50 mg at 10/27/14 2113  . tuberculin injection 5 Units  5 Units Intradermal Once Jomarie Longs, MD   5 Units at 10/28/14 1135    Lab Results:  No results found for this or any previous visit (from the past 48 hour(s)).  Physical Findings: AIMS: Facial and Oral Movements Muscles of Facial Expression: None, normal Lips and Perioral Area: None, normal Jaw: None, normal Tongue: None, normal,Extremity Movements Upper (arms, wrists, hands, fingers): None, normal Lower (legs, knees, ankles, toes): None, normal, Trunk Movements Neck, shoulders, hips: None, normal, Overall Severity Severity of abnormal movements (highest score from questions above): None, normal Incapacitation due to abnormal movements: None, normal, Dental Status Current problems with teeth and/or dentures?: No Does patient usually wear dentures?: No  CIWA:  CIWA-Ar Total: 3 COWS:     Assessment: Pt presented with request to get detoxification. Patient presented with severe withdrawal sx as well as recent DTs during his recent admission to inpatient facility elsewhere (2 weeks ago)the patient will need continued treatment.   Treatment Plan Summary: Daily contact with patient to assess and evaluate symptoms and progress in treatment and Medication management Will continue CIWA/librium protocol. Will increase Gabapentin to 300 mg po tid. Will continue  Trazodone 50 mg po qhs prn. Will provide pain management for sprained ankle - right sided- mild swelling- no tenderness. Reports Xray done 2 weeks ago was negative.   Will continue to monitor vitals ,medication compliance and treatment side effects while patient is here.  Will monitor for medical issues as well as call consult as needed  CSW will work on disposition , pt to be referred to a residential drug program. Will provide PPD today.  Medical Decision Making:  Review of Psycho-Social Stressors (1), Review or order clinical lab tests (1), Established Problem, Worsening (2), Review of Last Therapy Session (1), Review or order medicine tests (1), Review of Medication Regimen & Side Effects (2) and Review of New Medication or Change in Dosage (2)     Aimie Wagman MD 10/28/2014, 11:47 AM

## 2014-10-28 NOTE — Progress Notes (Signed)
D: Patient denies SI/HI and A/V hallucinations;  rates depression as 8/10; rates hopelessness 7/10; rates anxiety as 8/10; patient reports congestion and coughing; patient also reports tremors and anxiety; patient also reports some sweating   A: Monitored q 15 minutes; patient encouraged to attend groups; patient educated about medications; patient given medications per physician orders; patient encouraged to express feelings and/or concerns  R: Patient is exhibpatient's interaction with staff and peers......; patient was able to set goal to talk with staff 1:1 when having feelings of SI; patient is taking medications as prescribed and tolerating medications; patient is attending all groups

## 2014-10-29 MED ORDER — GUAIFENESIN 100 MG/5ML PO SYRP
200.0000 mg | ORAL_SOLUTION | Freq: Three times a day (TID) | ORAL | Status: DC
  Administered 2014-10-29 – 2014-11-02 (×17): 200 mg via ORAL
  Filled 2014-10-29 (×21): qty 10

## 2014-10-29 NOTE — Progress Notes (Signed)
D: Patient seen on day room watching TV and interacting with peers. C/O of congestion and mild sore throat but stated he has had some medicine for that. Patient reported mild leg pain rated 5/10. Denies SI, AH/VH at this time.  A: Support and encouragement given to patient. Medications given as ordered. Encouraged to verbalize needs to staff. R: Patient very receptive to intervention. Safety maintained at al times.      Will continue to monitor patient for safety and stability.

## 2014-10-29 NOTE — Progress Notes (Signed)
D: Patient denies SI/HI and A/V hallucinations; patient reporting some tremors, fatigue, cough, and congestion, and anxiety; patient reports productive cough  A: Monitored q 15 minutes; patient encouraged to attend groups; patient educated about medications; patient given medications per physician orders; patient encouraged to express feelings and/or concerns  R: Patient has been laying in the bed; cough witnessed but no production witnessed at this time; patient did not attend the lunch because he reports that he kept waking up during the night; patient's interaction with staff and peers is appropriate; patient was able to set goal to talk with staff 1:1 when having feelings of SI; patient is taking medications as prescribed and tolerating medications

## 2014-10-29 NOTE — [facility] Group Notes (Signed)
[facility] LCSW Group Therapy  10/29/2014 1:59 PM  Type of Therapy:  Group Therapy  Participation Level:  Did Not Attend  Modes of Intervention:  Discussion, Socialization and Support  Summary of Progress/Problems:Today's group focused on topics such as hope and care.  Hyatt,Candace 10/29/2014, 1:59 PM

## 2014-10-29 NOTE — [facility] Group Notes (Signed)
Adult Psychoeducational Group Note  Date:  10/29/2014 Time:  9:39 PM  Group Topic/Focus:  Wrap-Up Group:   The focus of this group is to help patients review their daily goal of treatment and discuss progress on daily workbooks.  Participation Level:  Minimal  Participation Quality:  Attentive  Affect:  Flat  Cognitive:  Appropriate  Insight: Good  Engagement in Group:  Limited  Modes of Intervention:  Discussion  Additional Comments:  [name] [name] stated he was sick all day and in bed feeling congested but he had some medication to help him feel better.  His goal was to get into [name] long term drug/alcohol program.  He said he was trying to get into [name] program in [place] or [place].  [name] [name], [name] [name] 10/29/2014, 9:39 PM

## 2014-10-29 NOTE — Progress Notes (Signed)
[facility]  10/29/2014 11:37 AM [name] GraterMichael [name]  MRN:  [id number] Subjective: Patient states " I feel sick ,I have a lot of cough , I still have some tremors."   Objective;Patient seen and chart reviewed.Pt today with continued anxiety sx, with some improvement. Pt with some improvement in his mood lability.  Pt today with complaints of sough with some sputum production. Will change his cough medication today and make it scheduled.  Pt is very motivated to get help with his substance abuse and wants to go to a residential facility. Pt with significant hx of ETOH /Cocaine/Cannabis abuse.   Pt also with withdrawal sx like anxiety as well as BL tremors, but improving. Pt is currently on CIWA protocol. Pt with hx of DT's 2 weeks ago.  Pt is also S/P sprained ankle , walks with the help of walker.     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 Sprained ankle [S93.409A] 10/28/2014  . 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 Alcohol use disorder, severe, dependence [F10.20] 10/26/2014  . Alcohol withdrawal [F10.239] 10/26/2014  . Cannabis use disorder, severe, dependence [F12.20] 10/26/2014  . Gastrointestinal bleeding, lower [K92.2] 02/14/2014   Total Time spent with patient: 30 minutes   Past Medical History:  Past Medical History  Diagnosis Date  . Alcoholic   . Peripheral neuropathy   . Depression   . Anxiety      Past Surgical History  Procedure Laterality Date  . Hip surgery     Family History: History reviewed. No pertinent family history. Social History:  History  Alcohol Use  . 0.0 oz/week    Comment: 1/5 daily     History  Drug Use  . Yes  . Special: Cocaine, Marijuana    History   Social History  . Marital Status: Single    Spouse Name: N/A  . Number of Children: N/A  . Years of Education: N/A   Social History Main Topics  . Smoking status: Current Every Day Smoker -- 1.00 packs/day for 15 years    Types: Cigarettes  . Smokeless tobacco: Not on file  . Alcohol Use: 0.0 oz/week     Comment: 1/5 daily  . Drug Use: Yes    Special: Cocaine, Marijuana  . Sexual Activity: No   Other Topics Concern  . None   Social History Narrative   Additional History:    Sleep: Fair  Appetite:  Fair     Musculoskeletal: Strength & Muscle Tone: within normal limits Gait & Station: Walks with walker Patient leans: N/A   Psychiatric Specialty Exam: Physical Exam  Review of Systems  Constitutional: Negative for fever.  Respiratory: Positive for cough and sputum production. Negative for hemoptysis, shortness of breath and wheezing.   Neurological: Positive for tremors.  Psychiatric/Behavioral: Positive for substance abuse. The patient is nervous/anxious.     Blood pressure 126/88, pulse 106, temperature 97.8 F (36.6 C), temperature source Oral, resp. rate 18, height 6\' 1"  (1.854 m), weight 70.761 kg (156 lb).Body mass index is 20.59 kg/(m^2).  General Appearance: Fairly  Groomed  Patent attorney::  Fair  Speech:  Normal Rate  Volume:  Normal  Mood:  Anxious  Affect:  Labile  Thought Process:  Coherent  Orientation:  Full (Time, Place, and Person)  Thought Content:  Rumination  Suicidal Thoughts:  No  Homicidal Thoughts:  No  Memory:  Immediate;   Fair Recent;   Fair Remote;   Fair  Judgement:  Impaired  Insight:  Shallow  Psychomotor Activity:  Tremor  Concentration:   Poor  Recall:  [employer] of Knowledge:Fair  Language: Fair  Akathisia:  No  Handed:  Right  AIMS (if indicated):     Assets:  Communication Skills  ADL's:  Intact  Cognition: WNL  Sleep:  Number of Hours: 6.75     Current Medications: Current Facility-Administered Medications  Medication Dose Route Frequency Provider Last Rate Last Dose  . acetaminophen (TYLENOL) tablet 650 mg  650 mg Oral Q6H PRN Jomarie Longs, MD   650 mg at 10/28/14 0832  . alum & mag hydroxide-simeth (MAALOX/MYLANTA) 200-200-20 MG/5ML suspension 30 mL  30 mL Oral Q4H PRN Kerry Hough, PA-C      . haloperidol (HALDOL) tablet 5 mg  5 mg Oral Q6H PRN Jomarie Longs, MD   5 mg at 10/28/14 1423   And  . benztropine (COGENTIN) tablet 1 mg  1 mg Oral Q6H PRN Jomarie Longs, MD   1 mg at 10/28/14 1423  . feeding supplement (ENSURE COMPLETE) (ENSURE COMPLETE) liquid 237 mL  237 mL Oral TID BM Lindwood Qua, NP   237 mL at 10/29/14 0854  . gabapentin (NEURONTIN) capsule 300 mg  300 mg Oral TID Jomarie Longs, MD   300 mg at 10/29/14 0851  . guaifenesin (ROBITUSSIN) 100 MG/5ML syrup 200 mg  200 mg Oral TID AC & HS Lasalle Abee, MD      . magnesium hydroxide (MILK OF MAGNESIA) suspension 30 mL  30 mL Oral Daily PRN Kerry Hough, PA-C      . multivitamin with minerals tablet 1 tablet  1 tablet Oral Daily Kerry Hough, PA-C   1 tablet at 10/29/14 0851  . nicotine (NICODERM CQ - dosed in mg/24 hours) patch 14 mg  14 mg Transdermal Daily Kerry Hough, PA-C   14 mg at 10/29/14 0854  . thiamine (B-1) injection 100 mg  100 mg Intramuscular Once Kerry Hough, PA-C   100 mg at 10/26/14 0500  . thiamine (VITAMIN B-1) tablet 100 mg  100 mg Oral Daily Kerry Hough, PA-C   100 mg at 10/29/14 1610  . traMADol (ULTRAM) tablet 50 mg  50 mg Oral Q12H Jomarie Longs, MD   50 mg at 10/29/14 0851  . traZODone (DESYREL) tablet 50 mg  50 mg Oral QHS,MR X 1 Kerry Hough, PA-C   50 mg at 10/28/14 2055  . tuberculin  injection 5 Units  5 Units Intradermal Once Jomarie Longs, MD   5 Units at 10/28/14 1135    Lab Results:  No results found for this or any previous visit (from the past 48 hour(s)).  Physical Findings: AIMS: Facial and Oral Movements Muscles of Facial Expression: None, normal Lips and Perioral Area: None, normal Jaw: None, normal Tongue: None, normal,Extremity Movements Upper (arms, wrists, hands, fingers): None, normal Lower (legs, knees, ankles, toes): None, normal, Trunk Movements Neck, shoulders, hips: None, normal, Overall Severity Severity of abnormal movements (highest score from questions above): None, normal Incapacitation due to abnormal movements: None,  normal, Dental Status Current problems with teeth and/or dentures?: No Does patient usually wear dentures?: No  CIWA:  CIWA-Ar Total: 6 COWS:     Assessment: Pt presented with request to get detoxification. Patient presented with severe withdrawal sx as well as recent DTs during his recent admission to inpatient facility elsewhere (2 weeks ago)the patient will need continued treatment.   Treatment Plan Summary: Daily contact with patient to assess and evaluate symptoms and progress in treatment and Medication management Will continue CIWA/librium protocol. Will continue Gabapentin  300 mg po tid. Will continue Trazodone 50 mg po qhs prn. Will provide pain management for sprained ankle - right sided- mild swelling- no tenderness. Reports Xray done 2 weeks ago was negative.   Will continue to monitor vitals ,medication compliance and treatment side effects while patient is here.  Will monitor for medical issues as well as call consult as needed  CSW will work on disposition , pt to be referred to a residential drug program.   Medical Decision Making:  Established Problem, Stable/Improving (1), Review of Psycho-Social Stressors (1), Review of Last Therapy Session (1) and Review of Medication Regimen & Side Effects  (2)     Tanisia Yokley MD 10/29/2014, 11:37 AM

## 2014-10-30 DIAGNOSIS — F10230 Alcohol dependence with withdrawal, uncomplicated: Secondary | ICD-10-CM

## 2014-10-30 MED ORDER — CHLORDIAZEPOXIDE HCL 25 MG PO CAPS
25.0000 mg | ORAL_CAPSULE | Freq: Two times a day (BID) | ORAL | Status: DC
  Administered 2014-10-30 – 2014-11-02 (×6): 25 mg via ORAL
  Filled 2014-10-30 (×6): qty 1

## 2014-10-30 MED ORDER — TRAMADOL HCL 50 MG PO TABS: 50.0000 mg | ORAL_TABLET | Freq: Two times a day (BID) | ORAL | Status: DC | PRN

## 2014-10-30 NOTE — Progress Notes (Signed)
[facility] Group Notes:  (Nursing/MHT/Case Management/Adjunct)  Date:  10/30/2014  Time:  10:03 PM  Type of Therapy:  Psychoeducational Skills  Participation Level:  Minimal  Participation Quality:  Attentive  Affect:  Appropriate  Cognitive:  Appropriate  Insight:  Improving  Engagement in Group:  Engaged  Modes of Intervention:  Education  Summary of Progress/Problems: The patient indicated that he had a good day since he managed to keep himself awake all day long. As a theme for the day, his coping skill is to attend a long-term drug treatment program.   [name] [name], [name] 10/30/2014, 10:03 PM

## 2014-10-30 NOTE — Progress Notes (Signed)
Patient ID: [name] [name], male   DOB: 1961-02-16, 54 y.o.   MRN: [id number] [facility] MD Progress Note  10/30/2014 3:02 PM [name] [name]  MRN:  [id number] Subjective:  Patient reports ongoing symptoms of WDL- states he feels nauseous, vaguely ill, and tremulous. He still has a subjective sense of being " jittery"   Objective: I have reviewed case with Nursing Staff. As noted, patient reports ongoing symptoms of WDL and states he has a history of protracted and difficult withdrawal syndromes, to include DTs in the past. Of note, does have some mild distal tremors. No current evidence of delirium. No disruptive behaviors on unit. Some milieu participation. Mobilizes using walker due to recent ankle sprain. Denies medication side effects. Interested in going to an inpatient rehab after discharge .    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 Sprained ankle [S93.409A] 10/28/2014  . 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 Alcohol use disorder, severe, dependence [F10.20] 10/26/2014  . Alcohol withdrawal [F10.239] 10/26/2014  . Cannabis use disorder, severe, dependence [F12.20] 10/26/2014  . Gastrointestinal bleeding, lower [K92.2] 02/14/2014   Total Time spent with patient: 20 minutes   Past Medical History:  Past Medical History  Diagnosis Date  . Alcoholic   . Peripheral neuropathy   . Depression   . Anxiety     Past Surgical  History  Procedure Laterality Date  . Hip surgery     Family History: History reviewed. No pertinent family history. Social History:  History  Alcohol Use  . 0.0 oz/week    Comment: 1/5 daily     History  Drug Use  . Yes  . Special: Cocaine, Marijuana    History   Social History  . Marital Status: Single    Spouse Name: N/A  . Number of Children: N/A  . Years of Education: N/A   Social History Main Topics  . Smoking status: Current Every Day Smoker -- 1.00 packs/day for 15 years    Types: Cigarettes  . Smokeless tobacco: Not on file  . Alcohol Use: 0.0 oz/week     Comment: 1/5 daily  . Drug Use: Yes    Special: Cocaine, Marijuana  . Sexual Activity: No   Other Topics Concern  . None   Social History Narrative   Additional History:    Sleep: improved   Appetite:  Fair     Musculoskeletal: Strength & Muscle Tone: within normal limits- some distal tremors are apparent Gait & Station: Walks with walker Patient leans: N/A   Psychiatric Specialty Exam: Physical Exam  ROS  Blood pressure 119/72, pulse 91, temperature 97.8 F (36.6 C), temperature source Oral, resp. rate 18, height  (1.854 m), weight 156 lb (70.761 kg).Body mass index is 20.59 kg/(m^2).  General Appearance: Fairly Groomed  Patent attorney::  Good  Speech:  Normal Rate  Volume:  Normal  Mood:  Depressed and reports he still feels " not good from the alcohol withdrawal"  Affect:  Labile, constricted but does smile at times appropriately  Thought Process:  Coherent  Orientation:  Full (Time,  Place, and Person)  Thought Content:  denies hallucinations, no delusions, does not appear internally preoccupied   Suicidal Thoughts:  No at this time denies any thoughts of hurting self and  contracts for safety on unit   Homicidal Thoughts:  No  Memory:  Immediate;   Fair Recent;   Fair Remote;   Fair  Judgement:  Fair  Insight:  Present  Psychomotor Activity:  No psychomotor agitation , some  distal tremors  Concentration:  Poor  Recall:  [employer]is[employer]Fair  Fund of Knowledge:Fair  Language: Fair  Akathisia:  No  Handed:  Right  AIMS (if indicated):     Assets:  Desire for Improvement Resilience  ADL's:  Intact  Cognition: WNL  Sleep:  Number of Hours: 6.75     Current Medications: Current Facility-Administered Medications  Medication Dose Route Frequency Provider Last Rate Last Dose  . acetaminophen (TYLENOL) tablet 650 mg  650 mg Oral Q6H PRN Jomarie LongsSaramma Eappen, MD   650 mg at 10/29/14 1822  . alum & mag hydroxide-simeth (MAALOX/MYLANTA) 200-200-20 MG/5ML suspension 30 mL  30 mL Oral Q4H PRN Kerry HoughSpencer E Simon, PA-C      . haloperidol (HALDOL) tablet 5 mg  5 mg Oral Q6H PRN Jomarie LongsSaramma Eappen, MD   5 mg at 10/28/14 1423   And  . benztropine (COGENTIN) tablet 1 mg  1 mg Oral Q6H PRN Jomarie LongsSaramma Eappen, MD   1 mg at 10/28/14 1423  . feeding supplement (ENSURE COMPLETE) (ENSURE COMPLETE) liquid 237 mL  237 mL Oral TID BM Lindwood QuaSheila May Agustin, NP   237 mL at 10/30/14 0925  . gabapentin (NEURONTIN) capsule 300 mg  300 mg Oral TID Jomarie LongsSaramma Eappen, MD   300 mg at 10/30/14 1200  . guaifenesin (ROBITUSSIN) 100 MG/5ML syrup 200 mg  200 mg Oral TID AC & HS Saramma Eappen, MD   200 mg at 10/30/14 1206  . magnesium hydroxide (MILK OF MAGNESIA) suspension 30 mL  30 mL Oral Daily PRN Kerry HoughSpencer E Simon, PA-C      . multivitamin with minerals tablet 1 tablet  1 tablet Oral Daily Kerry HoughSpencer E Simon, PA-C   1 tablet at 10/30/14 [id number]  . nicotine (NICODERM CQ - dosed in mg/24 hours) patch 14 mg  14 mg Transdermal Daily Kerry HoughSpencer E Simon, PA-C   14 mg at 10/30/14 [id number]  . thiamine (B-1) injection 100 mg  100 mg Intramuscular Once Kerry HoughSpencer E Simon, PA-C   100 mg at 10/26/14 0500  . thiamine (VITAMIN B-1) tablet 100 mg  100 mg Oral Daily Kerry HoughSpencer E Simon, PA-C   100 mg at 10/30/14 [id number]  . traMADol (ULTRAM) tablet 50 mg  50 mg Oral Q12H Jomarie LongsSaramma Eappen, MD   50 mg at 10/30/14 0828  . traZODone (DESYREL) tablet 50 mg  50 mg Oral QHS,MR X  1 Kerry HoughSpencer E Simon, PA-C   50 mg at 10/29/14 2121    Lab Results:  No results found for this or any previous visit (from the past 48 hour(s)).  Physical Findings: AIMS: Facial and Oral Movements Muscles of Facial Expression: None, normal Lips and Perioral Area: None, normal Jaw: None, normal Tongue: None, normal,Extremity Movements Upper (arms, wrists, hands, fingers): None, normal Lower (legs, knees, ankles, toes): None, normal, Trunk Movements Neck, shoulders, hips: None, normal, Overall Severity Severity of abnormal movements (highest score from questions above): None, normal Incapacitation due to abnormal movements: None, normal Patient's awareness of abnormal movements (rate only patient's report): No Awareness, Dental Status Current problems with  teeth and/or dentures?: No Does patient usually wear dentures?: No  CIWA:  CIWA-Ar Total: 2 COWS:     Assessment: Partially improved but still having some protracted/residual symptoms of alcohol withdrawal which are contributing to ongoing dysphoria, depression. Not suicidal. Of note, vitals are stable at this time.    Treatment Plan Summary: Daily contact with patient to assess and evaluate symptoms and progress in treatment and Medication management Will  Restart Librium at 25 mgrs BID, and taper gradually.  Will continue Gabapentin  300 mg po tid. Will continue Trazodone 50 mg po qhs prn.   Medical Decision Making:  Established Problem, Stable/Improving (1), Review of Psycho-Social Stressors (1), Review of Last Therapy Session (1) and Review of Medication Regimen & Side Effects (2)     Meika Earll MD 10/30/2014, 3:02 PM

## 2014-10-30 NOTE — Plan of Care (Signed)
Problem: Alteration in mood & ability to function due to Goal: STG-Patient will report withdrawal symptoms Outcome: Progressing Pt reported hand tremors r/t Withdrawal symptoms, he was assessed by both RN & MD this shift.

## 2014-10-30 NOTE — [facility] Group Notes (Signed)
[facility] Group Notes:  Coping skills and goals  Date:  10/30/2014  Time:  10:00am  Type of Therapy:  Nurse Education  Participation Level:  Did Not Attend  Participation Quality:  Inattentive  Affect:  Flat  Cognitive:  Lacking  Insight:  None  Engagement in Group:  None  Modes of Intervention:  Discussion  Summary of Progress/Problems:Pt did not attend  Rodman KeyWebb, Muslima Toppins [facility] 10/30/2014, 10:24 AM

## 2014-10-30 NOTE — Progress Notes (Addendum)
D: Pt alert, oriented to self, place, time and situation. Pt spent majority of this shift in bed. OOB for meds, meals and vitals. Pt Librium was extended by assigned physician post assessment for withdrawal symptoms which persist with hand tremors on assessment.  A: Q 15 minutes observations level maintained for SI / withdrawal /Detox as per ordered and pt monitored as such. All medications administered as per physician's ordered. Encouragement offered to pt to promote compliance with groups. PPD read today at 1300 and was negative with 0 mm induration noted at site. May NP was notified. R: Pt spent majority of this shift in bed. Pt did not attend groups despite multiple verbal encouragement; stated "I'm tired, I still feel nauseous at times with some diarrhea some times just not right now", when RN inquired. Denied HI, AH / VH when assessed. Verbally contracted for safety with SI. Affect brightened up as shift progressed from being irritable, labile to more pleasant. Pt denied adverse medication reactions at present. Safety maintained on / off ward. Continue POC.

## 2014-10-30 NOTE — Progress Notes (Signed)
patient seen on day room watching TV/ interacting with staff. Patient stated that the congestion is reducing. "I feel a lot better today". Patient continues to endorse mild leg pain but declines pain relieves. Denies SI, AH/VH at this time. Made no new complaint.  Support and encouragement offered. Encouraged to verbalize needs to staff. Due medications given as prescribed. Safety maintained at all times. Will continue to monitor patient for safety and stability.

## 2014-10-30 NOTE — [facility] Group Notes (Signed)
[facility] Group Notes: (Clinical Social Work)   10/30/2014      Type of Therapy:  Group Therapy   Participation Level:  Did Not Attend despite MHT prompting   Ambrose MantleMareida Grossman-Orr, LCSW 10/30/2014, 4:59 PM

## 2014-10-31 DIAGNOSIS — F1590 Other stimulant use, unspecified, uncomplicated: Secondary | ICD-10-CM

## 2014-10-31 NOTE — Progress Notes (Signed)
D: Patient stated "I am fine. I feel a lot better" cheerful and pleasant. Denies pain, SI, AH/VH. Made no new complaint. No signs of withdrawal symptoms noted.  A: Patient encouraged to continue with the treatment plan. Encouraged to verbalize needs to staff. Every 15 minutes check for safety in place.  R: Patient very receptive.       Will continue to monitor patient.

## 2014-10-31 NOTE — Progress Notes (Signed)
Patient ID: [name] GraterMichael [name], male   DOB: 1961/05/06, 54 y.o.   MRN: [id number] D: Patient reports minimal withdrawal symptoms.  He has not requested any prns for same.  Patient has been pleasant, but can be labile and irritable at times.  He denies any SI/HI/AVH.  Patient did not attend morning group.  He did come in at the end of music therapy.  Patient presents with flat affect and depressed mood.   A: Continue to monitor medication management and MD orders.  Safety checks completed every 15 minutes per protocol.  Meet 1:1 with patient to address concerns and offer encouragement. R: Patient's behavior is appropriate to situation.

## 2014-10-31 NOTE — Plan of Care (Signed)
Problem: Ineffective individual coping Goal: STG: Patient will remain free from self harm Outcome: Completed/Met Date Met:  10/31/14 Patient denies any suicidal thoughts today.

## 2014-10-31 NOTE — Progress Notes (Signed)
Patient ID: [name] [name], male   DOB: July 22, 1961, 54 y.o.   MRN: [id number] [facility] MD Progress Note  10/31/2014 3:07 PM Kalani Sthilaire  MRN:  [id number] Subjective:  "I'm definitely a lot better; you guys are doing a great job here. I would like to go to a long-term alcohol rehab program, preferably [facility]."   Objective: Pt seen and chart reviewed. He denies SI, HI, and AVH, and is able to contract for safety. He reports anxiety and depression both at 8/10, but improving. He states that he would like long-term treatment if possible. He presents as calm and cooperative, answering questions appropriately. He is also interacting well with staff members and other patients.   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 Sprained ankle [S93.409A] 10/28/2014  . 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 Alcohol use disorder, severe, dependence [F10.20] 10/26/2014  . Alcohol withdrawal [F10.239] 10/26/2014  . Cannabis use disorder, severe, dependence [F12.20] 10/26/2014  . Gastrointestinal bleeding, lower [K92.2] 02/14/2014   Total Time spent with patient: 25 minutes   Past Medical History:  Past Medical History  Diagnosis Date  . Alcoholic   . Peripheral neuropathy   . Depression   . Anxiety     Past Surgical History  Procedure Laterality Date  . Hip surgery     Family History: History reviewed. No pertinent family history. Social  History:  History  Alcohol Use  . 0.0 oz/week    Comment: 1/5 daily     History  Drug Use  . Yes  . Special: Cocaine, Marijuana    History   Social History  . Marital Status: Single    Spouse Name: N/A  . Number of Children: N/A  . Years of Education: N/A   Social History Main Topics  . Smoking status: Current Every Day Smoker -- 1.00 packs/day for 15 years    Types: Cigarettes  . Smokeless tobacco: Not on file  . Alcohol Use: 0.0 oz/week     Comment: 1/5 daily  . Drug Use: Yes    Special: Cocaine, Marijuana  . Sexual Activity: No   Other Topics Concern  . None   Social History Narrative   Additional History:    Sleep: improved   Appetite:  Fair     Musculoskeletal: Strength & Muscle Tone: within normal limits- some distal tremors are apparent Gait & Station: Walks with walker Patient leans: N/A   Psychiatric Specialty Exam: Physical Exam  ROS  Blood pressure 100/63, pulse 98, temperature 98.4 F (36.9 C), temperature source Oral, resp. rate 16, height  (1.854 m), weight 70.761 kg (156 lb).Body mass index is 20.59 kg/(m^2).  General Appearance: Fairly Groomed  Patent attorney::  Good  Speech:  Normal Rate  Volume:  Normal  Mood:  Depressed and reports he still feels " not good from the alcohol withdrawal"  Affect:  Labile, constricted but does smile at times appropriately  Thought Process:  Coherent  Orientation:  Full (Time, Place, and Person)  Thought Content:  denies hallucinations, no delusions, does not appear internally preoccupied  Suicidal Thoughts:  No at this time denies any thoughts of hurting self and  contracts for safety on unit   Homicidal Thoughts:  No  Memory:  Immediate;   Fair Recent;   Fair Remote;   Fair  Judgement:  Fair  Insight:  Present  Psychomotor Activity:  No psychomotor agitation   Concentration:  Poor  Recall:  [employer] of Knowledge:Fair  Language: Fair  Akathisia:  No  Handed:  Right  AIMS (if indicated):      Assets:  Desire for Improvement Resilience  ADL's:  Intact  Cognition: WNL  Sleep:  Number of Hours: 6.5     Current Medications: Current Facility-Administered Medications  Medication Dose Route Frequency Provider Last Rate Last Dose  . acetaminophen (TYLENOL) tablet 650 mg  650 mg Oral Q6H PRN Jomarie Longs, MD   650 mg at 10/29/14 1822  . alum & mag hydroxide-simeth (MAALOX/MYLANTA) 200-200-20 MG/5ML suspension 30 mL  30 mL Oral Q4H PRN Kerry Hough, PA-C      . haloperidol (HALDOL) tablet 5 mg  5 mg Oral Q6H PRN Jomarie Longs, MD   5 mg at 10/28/14 1423   And  . benztropine (COGENTIN) tablet 1 mg  1 mg Oral Q6H PRN Jomarie Longs, MD   1 mg at 10/28/14 1423  . chlordiazePOXIDE (LIBRIUM) capsule 25 mg  25 mg Oral BID Craige Cotta, MD   25 mg at 10/31/14 0748  . feeding supplement (ENSURE COMPLETE) (ENSURE COMPLETE) liquid 237 mL  237 mL Oral TID BM Lindwood Qua, NP   237 mL at 10/31/14 1254  . gabapentin (NEURONTIN) capsule 300 mg  300 mg Oral TID Jomarie Longs, MD   300 mg at 10/31/14 1210  . guaifenesin (ROBITUSSIN) 100 MG/5ML syrup 200 mg  200 mg Oral TID AC & HS Saramma Eappen, MD   200 mg at 10/31/14 1210  . magnesium hydroxide (MILK OF MAGNESIA) suspension 30 mL  30 mL Oral Daily PRN Kerry Hough, PA-C      . multivitamin with minerals tablet 1 tablet  1 tablet Oral Daily Kerry Hough, PA-C   1 tablet at 10/31/14 0748  . nicotine (NICODERM CQ - dosed in mg/24 hours) patch 14 mg  14 mg Transdermal Daily Kerry Hough, PA-C   14 mg at 10/31/14 0748  . thiamine (B-1) injection 100 mg  100 mg Intramuscular Once Kerry Hough, PA-C   100 mg at 10/26/14 0500  . thiamine (VITAMIN B-1) tablet 100 mg  100 mg Oral Daily Kerry Hough, PA-C   100 mg at 10/31/14 1610  . traMADol (ULTRAM) tablet 50 mg  50 mg Oral Q12H PRN Craige Cotta, MD      . traZODone (DESYREL) tablet 50 mg  50 mg Oral QHS,MR X 1 Kerry Hough, PA-C   50 mg at 10/30/14 2125    Lab  Results:  No results found for this or any previous visit (from the past 48 hour(s)).  Physical Findings: AIMS: Facial and Oral Movements Muscles of Facial Expression: None, normal Lips and Perioral Area: None, normal Jaw: None, normal Tongue: None, normal,Extremity Movements Upper (arms, wrists, hands, fingers): None, normal Lower (legs, knees, ankles, toes): None, normal, Trunk Movements Neck, shoulders, hips: None, normal, Overall Severity Severity of abnormal movements (highest score from questions above): None, normal Incapacitation due to abnormal movements: None, normal Patient's awareness of abnormal movements (rate only patient's report): No Awareness, Dental Status Current problems  with teeth and/or dentures?: No Does patient usually wear dentures?: No  CIWA:  CIWA-Ar Total: 2 COWS:        Treatment Plan Summary: Daily contact with patient to assess and evaluate symptoms and progress in treatment and Medication management Will  Restart Librium at 25 mgrs BID, and taper gradually.  Will continue Gabapentin  300 mg po tid. Will continue Trazodone 50 mg po qhs prn.   Medical Decision Making:  Established Problem, Stable/Improving (1), Review of Psycho-Social Stressors (1), Review of Last Therapy Session (1) and Review of Medication Regimen & Side Effects (2)   Beau FannyWithrow, John C, FNP-BC 10/31/2014, 3:07 PM   Agree with Assessment and Plan

## 2014-10-31 NOTE — [facility] Group Notes (Signed)
[facility] Group Notes: (Clinical Social Work)   10/31/2014      Type of Therapy:  Group Therapy   Participation Level:  Did Not Attend despite MHT prompting   Ambrose MantleMareida Grossman-Orr, LCSW 10/31/2014, 11:58 AM

## 2014-10-31 NOTE — [facility] Group Notes (Signed)
[facility] Group Notes:  (Nursing/MHT/Case Management/Adjunct)  Date:  10/31/2014  Time:  0930 am  Type of Therapy:  Psychoeducational Skills  Participation Level:  Did Not Attend   Cranford MonBeaudry, Cressida Milford Evans 10/31/2014, 10:18 AM

## 2014-11-01 NOTE — [facility] Group Notes (Signed)
[facility] LCSW Group Therapy  11/01/2014 1:15 pm  Type of Therapy: Process Group Therapy  Participation Level:  Active  Participation Quality:  Appropriate  Affect:  Flat  Cognitive:  Oriented  Insight:  Improving  Engagement in Group:  Limited  Engagement in Therapy:  Limited  Modes of Intervention:  Activity, Clarification, Education, Problem-solving and Support  Summary of Progress/Problems: Today's group addressed the issue of overcoming obstacles.  Patients were asked to identify their biggest obstacle post d/c that stands in the way of their on-going success, and then problem solve as to how to manage this.  Initially refused to come.  Revealed it had to do with another patient who is labile and disruptive.  Assured him she would not be invited, and he agreed to come.  Unable to identify obstacle other than really wanting to smoke, but agreed that sobriety, while an obstacle is something that he feels truly motivated for unlike before.  Attributes his faith in God and the fact that he's still here as proof that "there must be some plan and reason for me still being around."  Daryel Geraldorth, Lyda Colcord B 11/01/2014   3:15 PM

## 2014-11-01 NOTE — Tx Team (Addendum)
  Interdisciplinary Treatment Plan Update   Date Reviewed:  11/01/2014  Time Reviewed:  10:52 AM  Progress in Treatment:   Attending groups: Yes Participating in groups: Yes Taking medication as prescribed: Yes  Tolerating medication: Yes Family/Significant other contact made: Yes  Patient understands diagnosis: Yes  Discussing patient identified problems/goals with staff: Yes  See initial care plan Medical problems stabilized or resolved: Yes Denies suicidal/homicidal ideation: Yes  In tx team Patient has not harmed self or others: Yes  For review of initial/current patient goals, please see plan of care.  Estimated Length of Stay:  Likely d/c tomorrow  Reason for Continuation of Hospitalization:   New Problems/Goals identified:  N/A  Discharge Plan or Barriers:   [facility], stay with a friend in the meantime, follow up outpt  Additional Comments:  Attendees:  Signature: Ivin BootySarama Eappen, MD 11/01/2014 10:52 AM   Signature: Richelle Itood Neli Fofana, LCSW 11/01/2014 10:52 AM  Signature:  11/01/2014 10:52 AM  Signature: Marzetta Boardhrista Dopson, RN 11/01/2014 10:52 AM  Signature:  11/01/2014 10:52 AM  Signature:  11/01/2014 10:52 AM  Signature:   11/01/2014 10:52 AM  Signature:    Signature:    Signature:    Signature:    Signature:    Signature:      Scribe for Treatment Team:   Richelle Itood Atlee Villers, LCSW  11/01/2014 10:52 AM

## 2014-11-01 NOTE — Progress Notes (Signed)
Adult Psychoeducational Group Note  Date:  11/01/2014 Time:  12:55 AM  Group Topic/Focus:  Wrap-Up Group:   The focus of this group is to help patients review their daily goal of treatment and discuss progress on daily workbooks.  Participation Level:  Active  Participation Quality:  Appropriate  Affect:  Appropriate  Cognitive:  Appropriate  Insight: Appropriate  Engagement in Group:  Engaged  Modes of Intervention:  Discussion  Additional Comments: The patient expressed that he did not attend the groups today.The patient also said that  He had a all right day.  Octavio Mannshigpen, Tannis Burstein Lee 11/01/2014, 12:55 AM

## 2014-11-01 NOTE — [facility] Group Notes (Signed)
[facility]   11/01/2014 3:15 PM  Participation Quality:  Invited.  Did not attend    [name], [name] [name]

## 2014-11-01 NOTE — Progress Notes (Signed)
Patient ID: [name] [name], male   DOB: 1961-08-28, 54 y.o.   MRN: [id number]  DAR: Pt. Denies SI/HI and A/V Hallucinations to this Clinical research associatewriter. Patient reports chronic pain but refuses intervention at this time. Patient reports he will come to writer if he needs pain medication. Support and encouragement provided to the patient. Scheduled medications administered to patient per physician's orders. Patient is minimal and usually seen in his bed or sitting in the dayroom watching television. Patient can be irritable at times but overall cooperative. Patient continues to use his walker appropriately without complication or instruction. Q15 minute checks are maintained for safety.

## 2014-11-01 NOTE — Progress Notes (Signed)
Patient ID: [name] [name] [name], male   DOB: [birth year], 54 y.o.   MR[facility] [id number] [facility]  11/01/2014 1:21 PM [name] [name]  MRN:  [id number] Subjective:  "I'm doing a lot better.'   Objective: Pt seen and chart reviewed. Patient discussed with treatment team. He denies SI, HI, and AVH, and is able to contract for safety. He reports anxiety and depression both as improving. Pt with continued tremors - BL hands , currently on Librium protocol. Pt with recent Hx of DTs. Patient is motivated to get help with his ETOH abuse.[facility]   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 Sprained ankle [S93.409A] 10/28/2014  . 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 Alcohol use disorder, severe, dependence [F10.20] 10/26/2014  . Alcohol withdrawal [F10.239] 10/26/2014  . Cannabis use disorder, severe, dependence [F12.20] 10/26/2014  . Gastrointestinal bleeding, lower [K92.2] 02/14/2014   Total Time spent with patient: 25 minutes   Past Medical History:  Past Medical History  Diagnosis Date  . Alcoholic   . Peripheral neuropathy   . Depression   . Anxiety     Past Surgical History  Procedure Laterality Date  . Hip surgery     Family History: History reviewed. No pertinent family history. Social History:  History  Alcohol Use  . 0.0 oz/week    Comment: 1/5 daily      History  Drug Use  . Yes  . Special: Cocaine, Marijuana    History   Social History  . Marital Status: Single    Spouse Name: N/A  . Number of Children: N/A  . Years of Education: N/A   Social History Main Topics  . Smoking status: Current Every Day Smoker -- 1.00 packs/day for 15 years    Types: Cigarettes  . Smokeless tobacco: Not on file  . Alcohol Use: 0.0 oz/week     Comment: 1/5 daily  . Drug Use: Yes    Special: Cocaine, Marijuana  . Sexual Activity: No   Other Topics Concern  . None   Social History Narrative   Additional History:    Sleep: improved   Appetite:  Fair     Musculoskeletal: Strength & Muscle Tone: within normal limits- some distal tremors are apparent Gait & Station: Walks with walker Patient leans: N/A   Psychiatric Specialty Exam: Physical Exam  Review of Systems  Neurological: Positive for tremors.  Psychiatric/Behavioral: Positive for substance abuse.    Blood pressure 109/67, pulse 98, temperature 98.1 F (36.7 C), temperature source Oral, resp. rate 20, height 6\' 1"  (1.854 m), weight 70.761 kg (156 lb).Body mass index is 20.59 kg/(m^2).  General Appearance: Fairly Groomed  Patent attorneyye Contact::  Good  Speech:  Normal Rate  Volume:  Normal  Mood:  Anxious and Depressed  Affect:  Labile, constricted but does smile at times appropriately  Thought Process:  Coherent  Orientation:  Full (Time, Place, and Person)  Thought Content:  denies hallucinations, no delusions, does not appear internally preoccupied   Suicidal Thoughts:  No  at this time denies any thoughts of hurting self and  contracts for safety on unit   Homicidal Thoughts:  No  Memory:  Immediate;   Fair Recent;   Fair Remote;   Fair  Judgement:  Fair  Insight:  Present  Psychomotor Activity:tremors  Concentration:  Poor  Recall:  [employer] of Knowledge:Fair  Language: Fair  Akathisia:  No  Handed:  Right  AIMS (if indicated):     Assets:  Desire for  Improvement Resilience  ADL's:  Intact  Cognition: WNL  Sleep:  Number of Hours: 6.75     Current Medications: Current Facility-Administered Medications  Medication Dose Route Frequency Provider Last Rate Last Dose  . acetaminophen (TYLENOL) tablet 650 mg  650 mg Oral Q6H PRN Jomarie Longs, MD   650 mg at 10/29/14 1822  . alum & mag hydroxide-simeth (MAALOX/MYLANTA) 200-200-20 MG/5ML suspension 30 mL  30 mL Oral Q4H PRN Kerry Hough, PA-C      . haloperidol (HALDOL) tablet 5 mg  5 mg Oral Q6H PRN Jomarie Longs, MD   5 mg at 10/28/14 1423   And  . benztropine (COGENTIN) tablet 1 mg  1 mg Oral Q6H PRN Jomarie Longs, MD   1 mg at 10/28/14 1423  . chlordiazePOXIDE (LIBRIUM) capsule 25 mg  25 mg Oral BID Jomarie Longs, MD   25 mg at 11/01/14 0830  . feeding supplement (ENSURE COMPLETE) (ENSURE COMPLETE) liquid 237 mL  237 mL Oral TID BM Lindwood Qua, NP   237 mL at 10/31/14 1952  . gabapentin (NEURONTIN) capsule 300 mg  300 mg Oral TID Jomarie Longs, MD   300 mg at 11/01/14 1151  . guaifenesin (ROBITUSSIN) 100 MG/5ML syrup 200 mg  200 mg Oral TID AC & HS Maurisa Tesmer, MD   200 mg at 11/01/14 1151  . magnesium hydroxide (MILK OF MAGNESIA) suspension 30 mL  30 mL Oral Daily PRN Kerry Hough, PA-C      . multivitamin with minerals tablet 1 tablet  1 tablet Oral Daily Kerry Hough, PA-C   1 tablet at 11/01/14 0830  . nicotine (NICODERM CQ - dosed in mg/24 hours) patch 14 mg  14 mg Transdermal Daily Kerry Hough, PA-C   14 mg at 11/01/14 0831  . thiamine (B-1) injection 100 mg  100 mg Intramuscular Once Kerry Hough, PA-C   100 mg at 10/26/14 0500  . thiamine (VITAMIN B-1) tablet 100 mg  100 mg Oral Daily Kerry Hough, PA-C   100 mg at 11/01/14 0830  . traMADol (ULTRAM) tablet 50 mg  50 mg Oral Q12H PRN Craige Cotta, MD      . traZODone (DESYREL) tablet 50 mg  50 mg Oral QHS,MR X 1 Kerry Hough, PA-C   50 mg at 10/31/14 2148    Lab Results:  No results found  for this or any previous visit (from the past 48 hour(s)).  Physical Findings: AIMS: Facial and Oral Movements Muscles of Facial Expression: None, normal Lips and Perioral Area: None, normal Jaw: None, normal Tongue: None, normal,Extremity Movements Upper (arms, wrists, hands, fingers): None, normal Lower (legs, knees, ankles, toes): None, normal, Trunk Movements Neck, shoulders, hips: None, normal, Overall Severity Severity of abnormal movements (highest score from questions above): None, normal Incapacitation due to abnormal movements: None, normal Patient's awareness of abnormal movements (rate only patient's report): No Awareness, Dental Status Current problems with teeth and/or dentures?: No Does patient usually wear dentures?:  No  CIWA:  CIWA-Ar Total: 0 COWS:        Treatment Plan Summary: Daily contact with patient to assess and evaluate symptoms and progress in treatment and Medication management Will continue Librium at 25 mg BID, and taper gradually. Patient will receive Librium 25 mg x2 today and once tomorrow. Pt with fine tremors -BL hands . Pt with hx of DTs 2 weeks ago. Will continue to monitor. Will continue Gabapentin  300 mg po tid. Will continue Trazodone 50 mg po qhs prn.   Medical Decision Making:  Established Problem, Stable/Improving (1), Review of Psycho-Social Stressors (1), Review of Last Therapy Session (1) and Review of Medication Regimen & Side Effects (2)   Carle Fenech, md 11/01/2014, 1:21 PM

## 2014-11-01 NOTE — Progress Notes (Signed)
D:Patientin the dayroom on approach.  Patient appears depressed but brightens on approach.  Patient states his day was all right.  Patient states his goal for today was to go home.  Patient states he is supposed to go home tomorrow.  Patient states he has not learned anything since he has been here. Patient denies SI/HI and denies AVH.   A: Staff to monitor Q 15 mins for safety.  Encouragement and support offered.  Scheduled medications administered per orders. R: Patient remains safe on the unit.  Patient attended group tonight.  Patient visible on the unit and interacting with peers.  Patient taking administered medications.

## 2014-11-02 MED ORDER — GABAPENTIN 300 MG PO CAPS: 300.0000 mg | ORAL_CAPSULE | Freq: Three times a day (TID) | ORAL | Status: DC

## 2014-11-02 MED ORDER — ADULT MULTIVITAMIN W/MINERALS CH: 1.0000 | ORAL_TABLET | Freq: Every day | ORAL | Status: DC

## 2014-11-02 MED ORDER — TRAZODONE HCL 50 MG PO TABS: 50.0000 mg | ORAL_TABLET | Freq: Every evening | ORAL | Status: DC | PRN

## 2014-11-02 MED ORDER — FOLIC ACID 1 MG PO TABS: 1.0000 mg | ORAL_TABLET | Freq: Every day | ORAL | Status: DC

## 2014-11-02 NOTE — Progress Notes (Signed)
Patient ID: [name] [name], male   DOB: Mar 26, 1961, 54 y.o.   MRN: [id number]  Pt. Denies SI/HI and A/V hallucinations. Belongings returned to patient at time of discharge. Patient denies any new onset of pain or discomfort. Discharge instructions and medications were reviewed with patient. Patient verbalized understanding of both medications and discharge instructions. Patient discharged with a bus pass and money for [facility] bus. Q15 minute safety checks maintained until discharge. Patient returned walker and received his pair of crutches. No distress noted upon discharge.

## 2014-11-02 NOTE — [facility] Group Notes (Signed)
[facility] Group Notes:  (Nursing/MHT/Case Management/Adjunct)  Date:  11/02/2014  Time:  11:04 AM  Type of Therapy:  Nurse Education  Participation Level:  Active  Participation Quality:  Attentive  Affect:  Appropriate  Cognitive:  Alert and Appropriate  Insight:  Lacking  Engagement in Group:  Engaged  Modes of Intervention:  Discussion and Education  Summary of Progress/Problems: The purpose of this group is to discuss the topic of the day which is Recovery. Patient attended group and was engaged and attentive. Patient asked questions about his discharge and the medications he will be taking when he leaves today.  Tierra Thoma E 11/02/2014, 11:04 AM

## 2014-11-02 NOTE — Progress Notes (Signed)
Adult Psychoeducational Group Note  Date:  11/02/2014 Time:  1:10 AM  Group Topic/Focus:  Wrap-Up Group:   The focus of this group is to help patients review their daily goal of treatment and discuss progress on daily workbooks.  Participation Level:  Active  Participation Quality:  Appropriate  Affect:  Appropriate and Irritable  Cognitive:  Appropriate  Insight: Appropriate  Engagement in Group:  Engaged  Modes of Intervention:  Discussion  Additional Comments:  Pt stated he had an all right day and that tomorrow his goal is to get discharged. His plan is to go to a long-term treatment facility.  Caswell CorwinOwen, Bracen Schum C 11/02/2014, 1:10 AM

## 2014-11-02 NOTE — [facility] Suicide Risk Assessment (Signed)
[facility] Discharge Suicide Risk Assessment   Demographic Factors:  Male and Caucasian  Total Time spent with patient: 30 minutes  Musculoskeletal: Strength & Muscle Tone: within normal limits Gait & Station: normal Patient leans: N/A  Psychiatric Specialty Exam: Physical Exam  Review of Systems  Constitutional: Negative.   HENT: Negative.   Eyes: Negative.   Respiratory: Negative.   Cardiovascular: Negative.   Gastrointestinal: Negative.   Genitourinary: Negative.   Musculoskeletal: Negative.   Skin: Negative.   Neurological: Negative.   Psychiatric/Behavioral: Positive for substance abuse. Negative for depression, suicidal ideas and hallucinations. The patient is not nervous/anxious and does not have insomnia.     Blood pressure 108/80, pulse 103, temperature 97.7 F (36.5 C), temperature source Oral, resp. rate 18, height  (1.854 m), weight 70.761 kg (156 lb).Body mass index is 20.59 kg/(m^2).  General Appearance: Casual  Eye Contact::  Fair  Speech:  Clear and Coherent409  Volume:  Normal  Mood:  Euthymic  Affect:  Congruent  Thought Process:  Coherent  Orientation:  Full (Time, Place, and Person)  Thought Content:  WDL  Suicidal Thoughts:  No  Homicidal Thoughts:  No  Memory:  Immediate;   Fair Recent;   Fair Remote;   Fair  Judgement:  Fair  Insight:  Shallow  Psychomotor Activity:  Normal  Concentration:  Fair  Recall:  [employer] of Knowledge:Fair  Language: Fair  Akathisia:  No  Handed:  Right  AIMS (if indicated):     Assets:  Communication Skills Desire for Improvement  Sleep:  Number of Hours: 5.5  Cognition: WNL  ADL's:  Intact   Have you used any form of tobacco in the last 30 days? (Cigarettes, Smokeless Tobacco, Cigars, and/or Pipes): Yes  Has this patient used any form of tobacco in the last 30 days? (Cigarettes, Smokeless Tobacco, Cigars, and/or Pipes) N/A PATIENT IS NOT READY TO QUIT.  Mental Status Per Nursing Assessment::   On  Admission:  Self-harm thoughts  Current Mental Status by Physician: Patient denies SI/HI/AH/VH  Loss Factors: Decline in physical health and Financial problems/change in socioeconomic status  Historical Factors: Impulsivity  Risk Reduction Factors:   Access to health care  Continued Clinical Symptoms:  Alcohol/Substance Abuse/Dependencies Previous Psychiatric Diagnoses and Treatments Medical Diagnoses and Treatments/Surgeries  Cognitive Features That Contribute To Risk:  Polarized thinking    Suicide Risk:  Minimal: No identifiable suicidal ideation.  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 Sprained ankle [S93.409A] 10/28/2014  . 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 Alcohol use disorder, severe, dependence [F10.20] 10/26/2014  . Cannabis use disorder, severe, dependence [F12.20] 10/26/2014  . Gastrointestinal bleeding, lower [K92.2] 02/14/2014      Plan Of Care/Follow-up recommendations:  Activity:  no restrictions Diet:  regular Tests:  as needed Other:  follow up with after care as scheduled  Is patient on multiple antipsychotic therapies at  discharge:  No   Has Patient had three or more failed trials of  antipsychotic monotherapy by history:  No  Recommended Plan for Multiple Antipsychotic Therapies: NA    Tarell Schollmeyer 11/02/2014, 9:45 AM

## 2014-11-02 NOTE — Discharge Summary (Signed)
Physician Discharge Summary Note  Patient:  [name] [name] is an 54 y.o., male MRN:  [id number] DOB:  [birth year] Patient phone:  [phone] (home)  Patient address:   [address] [place] [address],  Total Time spent with patient: 30 minutes  Date of Admission:  10/26/2014 Date of Discharge: 11/02/14  Reason for Admission:  Mood stabilization treatments  Principal Problem: Substance or medication-induced bipolar and related disorder with onset during intoxication Discharge Diagnoses: Patient Active Problem List   Diagnosis Date Noted  . Sprained ankle [S93.409A] 10/28/2014  . 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 Alcohol use disorder, severe, dependence [F10.20] 10/26/2014  . Cannabis use disorder, severe, dependence [F12.20] 10/26/2014  . Gastrointestinal bleeding, lower [K92.2] 02/14/2014    Musculoskeletal: Strength & Muscle Tone: within normal limits Gait & Station: normal Patient leans: N/A  Psychiatric Specialty Exam: Physical Exam  Psychiatric: [name] [name] has a normal mood and affect. His speech is normal and behavior is normal. Judgment and thought content normal. Cognition and memory are normal.    Review of Systems  Constitutional: Negative.   HENT: Negative.   Eyes: Negative.   Respiratory: Negative.   Cardiovascular: Negative.   Gastrointestinal: Negative.   Genitourinary: Negative.   Musculoskeletal: Negative.   Skin: Negative.   Neurological: Negative.   Endo/Heme/Allergies: Negative.   Psychiatric/Behavioral: Positive for depression (Stabilizing with treatments) and substance abuse (UDS positive for marijuana prior to admission). Negative for suicidal ideas, hallucinations and memory loss. The patient is not nervous/anxious and does not have insomnia.     Blood pressure 108/80, pulse 103, temperature 97.7 F (36.5 C), temperature source Oral, resp. rate 18, height  (1.854  m), weight 70.761 kg (156 lb).Body mass index is 20.59 kg/(m^2).  See Physician SRA     Past Medical History:  Past Medical History  Diagnosis Date  . Alcoholic   . Peripheral neuropathy   . Depression   . Anxiety     Past Surgical History  Procedure Laterality Date  . Hip surgery     Family History: History reviewed. No pertinent family history. Social History:  History  Alcohol Use  . 0.0 oz/week    Comment: 1/5 daily     History  Drug Use  . Yes  . Special: Cocaine, Marijuana    History   Social History  . Marital Status: Single    Spouse Name: N/A  . Number of Children: N/A  . Years of Education: N/A   Social History Main Topics  . Smoking status: Current Every Day Smoker -- 1.00 packs/day for 15 years    Types: Cigarettes  . Smokeless tobacco: Not on file  . Alcohol Use: 0.0 oz/week     Comment: 1/5 daily  . Drug Use: Yes    Special: Cocaine, Marijuana  . Sexual Activity: No   Other Topics Concern  . None   Social History Narrative    Past Psychiatric History: Hospitalizations:  Outpatient Care:  Substance Abuse Care:  Self-Mutilation:  Suicidal Attempts:  Violent Behaviors:   Risk to Self: Is patient at risk for suicide?: Yes What has been your use of drugs/alcohol within the last 12 months?: States [name] [name] drinks fifth of gin/day; used crack cocaine but "quit" Risk to Others:   Prior Inpatient Therapy:   Prior Outpatient Therapy:    Level of Care:  OP  Hospital Course:  [name] [name] is a 54 y.o. male who voluntarily presents to [facility] with  SI/depression and requesting alcohol, cocaine and marijuana detox. Pt is homeless. Pt reports the following: [name] [name]'s been SI x 1 week with a plan to cut himself. Pt states [name] [name] tried to cut himself approx 4 days ago. Pt denies previous SI attempts. Pt says his SI thoughts are triggered by losing family members, homelessness, SA and his well being. Pt says--" I do not have any reason to live". Pt also admits  drinking 1/5 of liquor and 3-4 24oz beers daily, his last drink was 2-3 days ago and [name] [name] drank 1/5 of liquor and 3-4 24oz beers. Pt states [name] [name] drinks about the same everyday. [name] [name] consumes a $20 bag of cocaine on a monthly basis and says his last use was 4-5 days ago and [name] [name] used a $20 bag. Pt smokes 2 marijuana "blunts" and says his last use was 10/22/14 and [name] [name] smoked 2 "blunts". Pt is experiencing w/d sxs: tremors, runny nose, watery eyes, anxiety and body aches. [name] [name] admits [name] [name] has up coming court date on 12/2014 for shoplifting([employer]). Pt reported that [name] [name] "went through DT's about 3 wks ago and denies any seizure activity due to SA but says [name] [name] has blackouts.          [name] [name] was admitted to the adult unit. [name] [name] was evaluated and his symptoms were identified. Medication management was discussed and initiated. The patient completed the librium protocol to safety detox him from alcohol, which was extended for two days due to complaints of ongoing withdrawal symptoms.  [name] [name] was oriented to the unit and encouraged to participate in unit programming. Medical problems were identified and treated appropriately. Home medication was restarted as needed.        The patient was evaluated each day by a clinical provider to ascertain the patient's response to treatment.  Improvement was noted by the patient's report of decreasing symptoms, improved sleep and appetite, affect, medication tolerance, behavior, and participation in unit programming.  [name] [name] was asked each day to complete a self inventory noting mood, mental status, pain, new symptoms, anxiety and concerns.         [name] [name] responded well to medication and being in a therapeutic and supportive environment. Positive and appropriate behavior was noted and the patient was motivated for recovery.  The patient worked closely with the treatment team and case manager to develop a discharge plan with appropriate goals. Coping skills, problem solving as well as relaxation  therapies were also part of the unit programming.         By the day of discharge [name] [name] was in much improved condition than upon admission.  Symptoms were reported as significantly decreased or resolved completely. The patient denied SI/HI and voiced no AVH. [name] [name] was motivated to continue taking medication with a goal of continued improvement in mental health. [name] [name] was discharged home with a plan to follow up as noted below. The patient was provided with sample medications and prescriptions at time of discharge. [name] [name] left [facility] in stable condition with all belongings returned to him.   Consults:  None  Significant Diagnostic Studies:  Chemistry panel, CBC, UDS positive for benzodiazepines and marijuana   Discharge Vitals:   Blood pressure 108/80, pulse 103, temperature 97.7 F (36.5 C), temperature source Oral, resp. rate 18, height  (1.854 m), weight 70.761 kg (156 lb). Body mass index is 20.59 kg/(m^2). Lab Results:   No results found for this or any previous visit (from the past 72 hour(s)).  Physical Findings: AIMS: Facial and  Oral Movements Muscles of Facial Expression: None, normal Lips and Perioral Area: None, normal Jaw: None, normal Tongue: None, normal,Extremity Movements Upper (arms, wrists, hands, fingers): None, normal Lower (legs, knees, ankles, toes): None, normal, Trunk Movements Neck, shoulders, hips: None, normal, Overall Severity Severity of abnormal movements (highest score from questions above): None, normal Incapacitation due to abnormal movements: None, normal Patient's awareness of abnormal movements (rate only patient's report): No Awareness, Dental Status Current problems with teeth and/or dentures?: No Does patient usually wear dentures?: No  CIWA:  CIWA-Ar Total: 0 COWS:      See Psychiatric Specialty Exam and Suicide Risk Assessment completed by Attending Physician prior to discharge.  Discharge destination:  Home  Is patient on multiple  antipsychotic therapies at discharge:  No   Has Patient had three or more failed trials of antipsychotic monotherapy by history:  No  Recommended Plan for Multiple Antipsychotic Therapies: NA     Medication List    STOP taking these medications        benztropine 1 MG tablet  Commonly known as:  COGENTIN     clonazePAM 2 MG tablet  Commonly known as:  KLONOPIN     divalproex 500 MG 24 hr tablet  Commonly known as:  DEPAKOTE ER     haloperidol 2 MG tablet  Commonly known as:  HALDOL     lactulose 10 G packet  Commonly known as:  CEPHULAC     LORazepam 0.5 MG tablet  Commonly known as:  ATIVAN     valproic acid 250 MG capsule  Commonly known as:  DEPAKENE      TAKE these medications      Indication   folic acid 1 MG tablet  Commonly known as:  FOLVITE  Take 1 tablet (1 mg total) by mouth daily.   Indication:  Deficiency of Folic Acid in the Diet     gabapentin 300 MG capsule  Commonly known as:  NEURONTIN  Take 1 capsule (300 mg total) by mouth 3 (three) times daily.   Indication:  Anxiety, Chronic pain     multivitamin with minerals Tabs tablet  Take 1 tablet by mouth daily.   Indication:  Vitamin Supplementation     traZODone 50 MG tablet  Commonly known as:  DESYREL  Take 1 tablet (50 mg total) by mouth at bedtime and may repeat dose one time if needed.   Indication:  Trouble Sleeping       Follow-up Information    Follow up with [facility] On 11/04/2014.   Why:  Thursday at 8:00AM  [facility] #  [id number]   Contact information:   [address]  [phone]  F: [phone]      Follow-up recommendations:    Activity: no restrictions Diet: regular Tests: as needed Other: follow up with after care as scheduled  Comments:   Take all your medications as prescribed by your mental healthcare provider.  Report any adverse effects and or reactions from your medicines to your outpatient provider promptly.  Patient is instructed and cautioned to not  engage in alcohol and or illegal drug use while on prescription medicines.  In the event of worsening symptoms, patient is instructed to call the crisis hotline, 911 and or go to the nearest ED for appropriate evaluation and treatment of symptoms.  Follow-up with your primary care provider for your other medical issues, concerns and or health care needs.   Total Discharge Time: Greater than 30 minutes  SignedFransisca Kaufmann: Gemayel Mascio NP-C 11/02/2014, 6:55 PM

## 2014-11-02 NOTE — Progress Notes (Signed)
  [facility] :  Will you be returning to the same living situation after discharge:  No.Says he'll go to [facility] At discharge, do you have transportation home?: Yes,  bus pass, PART money Do you have the ability to pay for your medications: Yes,  mental health  Release of information consent forms completed and in the chart;  Patient's signature needed at discharge.  Patient to Follow up at: Follow-up Information    Follow up with [facility] On 11/04/2014.   Why:  Thursday at 8:00AM  [facility] #  [id number]   Contact information:   [address]  [phone]  F: [phone]      Patient denies SI/HI: Yes,  yes    Safety Planning and Suicide Prevention discussed: Yes,  yes  Have you used any form of tobacco in the last 30 days? (Cigarettes, Smokeless Tobacco, Cigars, and/or Pipes): Yes  Has patient been referred to the Quitline?: Patient refused referral  [name] [name], [name] [name] B 11/02/2014, 10:30 AM

## 2014-11-04 NOTE — Progress Notes (Signed)
Patient Discharge Instructions:  After Visit Summary (AVS):   Faxed to:  11/04/14 Discharge Summary Note:   Faxed to:  11/04/14 Psychiatric Admission Assessment Note:   Faxed to:  11/04/14 Suicide Risk Assessment - Discharge Assessment:   Faxed to:  11/04/14 Faxed/Sent to the Next Level Care provider:  11/04/14 [facility] [phone]  [name] ReddenSheena E Ava, 11/04/2014, 3:56 PM

## 2015-02-22 ENCOUNTER — Emergency Department (HOSPITAL_COMMUNITY): Payer: Self-pay

## 2015-02-22 ENCOUNTER — Encounter (HOSPITAL_COMMUNITY): Payer: Self-pay | Admitting: *Deleted

## 2015-02-22 ENCOUNTER — Emergency Department (HOSPITAL_COMMUNITY)
Admission: EM | Admit: 2015-02-22 | Discharge: 2015-02-28 | Disposition: A | Discharge status: Home / Self Care | Payer: 59 | Attending: Emergency Medicine | Admitting: Emergency Medicine

## 2015-02-22 DIAGNOSIS — F419 Anxiety disorder, unspecified: Secondary | ICD-10-CM | POA: Insufficient documentation

## 2015-02-22 DIAGNOSIS — M25569 Pain in unspecified knee: Secondary | ICD-10-CM

## 2015-02-22 DIAGNOSIS — F1210 Cannabis abuse, uncomplicated: Secondary | ICD-10-CM | POA: Insufficient documentation

## 2015-02-22 DIAGNOSIS — F1410 Cocaine abuse, uncomplicated: Secondary | ICD-10-CM | POA: Insufficient documentation

## 2015-02-22 DIAGNOSIS — Z720 Tobacco use: Secondary | ICD-10-CM | POA: Insufficient documentation

## 2015-02-22 DIAGNOSIS — M25551 Pain in right hip: Secondary | ICD-10-CM | POA: Insufficient documentation

## 2015-02-22 DIAGNOSIS — F1910 Other psychoactive substance abuse, uncomplicated: Secondary | ICD-10-CM

## 2015-02-22 DIAGNOSIS — R45851 Suicidal ideations: Secondary | ICD-10-CM

## 2015-02-22 DIAGNOSIS — F329 Major depressive disorder, single episode, unspecified: Secondary | ICD-10-CM | POA: Insufficient documentation

## 2015-02-22 DIAGNOSIS — G629 Polyneuropathy, unspecified: Secondary | ICD-10-CM | POA: Insufficient documentation

## 2015-02-22 DIAGNOSIS — F1310 Sedative, hypnotic or anxiolytic abuse, uncomplicated: Secondary | ICD-10-CM | POA: Insufficient documentation

## 2015-02-22 DIAGNOSIS — F1020 Alcohol dependence, uncomplicated: Secondary | ICD-10-CM | POA: Diagnosis present

## 2015-02-22 DIAGNOSIS — M25561 Pain in right knee: Secondary | ICD-10-CM | POA: Insufficient documentation

## 2015-02-22 LAB — COMPREHENSIVE METABOLIC PANEL
ALT: 14 U/L — AB (ref 17–63)
AST: 22 U/L (ref 15–41)
Albumin: 4 g/dL (ref 3.5–5.0)
Alkaline Phosphatase: 87 U/L (ref 38–126)
Anion gap: 10 (ref 5–15)
BUN: 11 mg/dL (ref 6–20)
CO2: 26 mmol/L (ref 22–32)
CREATININE: 0.7 mg/dL (ref 0.61–1.24)
Calcium: 8.4 mg/dL — ABNORMAL LOW (ref 8.9–10.3)
Chloride: 100 mmol/L — ABNORMAL LOW (ref 101–111)
GFR calc Af Amer: >60 mL/min (ref >60)
GFR calc non Af Amer: >60 mL/min (ref >60)
GLUCOSE: 117 mg/dL — AB (ref 65–99)
POTASSIUM: 3.3 mmol/L — AB (ref 3.5–5.1)
SODIUM: 136 mmol/L (ref 135–145)
TOTAL PROTEIN: 7.3 g/dL (ref 6.5–8.1)
Total Bilirubin: 1.3 mg/dL — ABNORMAL HIGH (ref 0.3–1.2)

## 2015-02-22 LAB — CBC
HCT: 35.1 % — ABNORMAL LOW (ref 39.0–52.0)
HEMOGLOBIN: 11.8 g/dL — AB (ref 13.0–17.0)
MCH: 30.6 pg (ref 26.0–34.0)
MCHC: 33.6 g/dL (ref 30.0–36.0)
MCV: 91.2 fL (ref 78.0–100.0)
Platelets: 165 10*3/uL (ref 150–400)
RBC: 3.85 MIL/uL — ABNORMAL LOW (ref 4.22–5.81)
RDW: 13.8 % (ref 11.5–15.5)
WBC: 12.2 10*3/uL — ABNORMAL HIGH (ref 4.0–10.5)

## 2015-02-22 LAB — SALICYLATE LEVEL: Salicylate Lvl: <4 mg/dL (ref 2.8–30.0)

## 2015-02-22 LAB — RAPID URINE DRUG SCREEN, HOSP PERFORMED
Amphetamines: NOT DETECTED
BENZODIAZEPINES: POSITIVE — AB
Barbiturates: NOT DETECTED
COCAINE: POSITIVE — AB
OPIATES: NOT DETECTED
Tetrahydrocannabinol: POSITIVE — AB

## 2015-02-22 LAB — ETHANOL

## 2015-02-22 LAB — ACETAMINOPHEN LEVEL

## 2015-02-22 MED ORDER — ACETAMINOPHEN 325 MG PO TABS
650.0000 mg | ORAL_TABLET | ORAL | Status: DC | PRN
  Administered 2015-02-24 – 2015-02-27 (×3): 650 mg via ORAL
  Filled 2015-02-22 (×3): qty 2

## 2015-02-22 MED ORDER — ONDANSETRON HCL 4 MG PO TABS
4.0000 mg | ORAL_TABLET | Freq: Three times a day (TID) | ORAL | Status: DC | PRN
  Administered 2015-02-24 – 2015-02-26 (×2): 4 mg via ORAL
  Filled 2015-02-22 (×2): qty 1

## 2015-02-22 MED ORDER — NICOTINE 21 MG/24HR TD PT24
21.0000 mg | MEDICATED_PATCH | Freq: Every day | TRANSDERMAL | Status: DC
  Administered 2015-02-22 – 2015-02-28 (×7): 21 mg via TRANSDERMAL
  Filled 2015-02-22 (×6): qty 1

## 2015-02-22 MED ORDER — LORAZEPAM 1 MG PO TABS
1.0000 mg | ORAL_TABLET | Freq: Three times a day (TID) | ORAL | Status: DC | PRN
  Administered 2015-02-22 – 2015-02-28 (×7): 1 mg via ORAL
  Filled 2015-02-22 (×6): qty 1

## 2015-02-22 MED ORDER — LORAZEPAM 1 MG PO TABS
0.0000 mg | ORAL_TABLET | Freq: Two times a day (BID) | ORAL | Status: AC
  Administered 2015-02-24 – 2015-02-25 (×2): 1 mg via ORAL
  Filled 2015-02-22 (×3): qty 1

## 2015-02-22 MED ORDER — LORAZEPAM 1 MG PO TABS
0.0000 mg | ORAL_TABLET | Freq: Four times a day (QID) | ORAL | Status: AC
  Administered 2015-02-23 – 2015-02-24 (×3): 1 mg via ORAL
  Filled 2015-02-22 (×3): qty 1

## 2015-02-22 MED ORDER — THIAMINE HCL 100 MG/ML IJ SOLN: 100.0000 mg | Freq: Every day | INTRAMUSCULAR | Status: DC

## 2015-02-22 MED ORDER — ALUM & MAG HYDROXIDE-SIMETH 200-200-20 MG/5ML PO SUSP: 30.0000 mL | ORAL | Status: DC | PRN

## 2015-02-22 MED ORDER — ZOLPIDEM TARTRATE 5 MG PO TABS
5.0000 mg | ORAL_TABLET | Freq: Every evening | ORAL | Status: DC | PRN
  Administered 2015-02-24: 5 mg via ORAL
  Filled 2015-02-22: qty 1

## 2015-02-22 MED ORDER — VITAMIN B-1 100 MG PO TABS
100.0000 mg | ORAL_TABLET | Freq: Every day | ORAL | Status: DC
  Administered 2015-02-22 – 2015-02-28 (×7): 100 mg via ORAL
  Filled 2015-02-22 (×7): qty 1

## 2015-02-22 NOTE — ED Notes (Signed)
Belongings in locker number [phone]

## 2015-02-22 NOTE — ED Notes (Addendum)
Pt reports fall x 2 days ago [facility]  Had hip xray but not his R knee.  Pt reports R hip and R knee pain.  Unable to get up without falling.  Pt reports he is not able to stand up d/t pain.;  Pt is also requesting detox from [facility].  Normally drinks a fifth daily.  Last drink was last night.  Pt is A&Ox 4. Calm and cooperative at this time.  Pt also reports sore throat and R ear pain x 2 days.  Verbalizes SI with plan to OD on pills

## 2015-02-22 NOTE — ED Provider Notes (Signed)
CSN: [id number]     Arrival date & time 02/22/15  1432 History   First MD Initiated Contact with Patient 02/22/15 1604     Chief Complaint  Patient presents with  . Leg Pain  . Suicidal     (Consider location/radiation/quality/duration/timing/severity/associated sxs/prior Treatment) HPI  Pt presenting with c/o feeling suicidal.  He states that he is homeless and has been drinking daily and taking cocaine and using marijuana. His last drink was last night. He states he is also having right hip and knee pain.  He was seen for fall 2 days ago at [facility] and states a hip xray was done, but he is still having pain in his knee.  He states that he feels suicidal and would overdose on pills.  There are no other associated systemic symptoms, there are no other alleviating or modifying factors.   Past Medical History  Diagnosis Date  . Alcoholic   . Peripheral neuropathy   . Depression   . Anxiety    Past Surgical History  Procedure Laterality Date  . Hip surgery     No family history on file. History  Substance Use Topics  . Smoking status: Current Every Day Smoker -- 0.50 packs/day for 15 years    Types: Cigarettes  . Smokeless tobacco: Not on file  . Alcohol Use: 0.0 oz/week     Comment: 1/5 daily    Review of Systems  ROS reviewed and all otherwise negative except for mentioned in HPI    Allergies  Nsaids; Ketorolac; and Pantoprazole sodium  Home Medications   Prior to Admission medications   Medication Sig Start Date End Date Taking? Authorizing Provider  folic acid (FOLVITE) 1 MG tablet Take 1 tablet (1 mg total) by mouth daily. Patient not taking: Reported on 02/22/2015 11/02/14   Thermon LeylandLaura A Davis, NP  gabapentin (NEURONTIN) 300 MG capsule Take 1 capsule (300 mg total) by mouth 3 (three) times daily. Patient not taking: Reported on 02/22/2015 11/02/14   Thermon LeylandLaura A Davis, NP  Multiple Vitamin (MULTIVITAMIN WITH MINERALS) TABS tablet Take 1 tablet by mouth daily. Patient not taking:  Reported on 02/22/2015 11/02/14   Thermon LeylandLaura A Davis, NP  traZODone (DESYREL) 50 MG tablet Take 1 tablet (50 mg total) by mouth at bedtime and may repeat dose one time if needed. Patient not taking: Reported on 02/22/2015 11/02/14   Thermon LeylandLaura A Davis, NP   BP 112/71 mmHg  Pulse 105  Temp(Src) 97.5 F (36.4 C) (Oral)  Resp 20  SpO2 94%  Vitals reviewed Physical Exam  Physical Examination: General appearance - alert, well appearing, and in no distress Mental status - alert, oriented to person, place, and time Eyes -no conjunctival injection no scleral icterus Mouth - mucous membranes moist, pharynx normal without lesions Chest - clear to auscultation, no wheezes, rales or rhonchi, symmetric air entry Heart - normal rate, regular rhythm, normal S1, S2, no murmurs, rubs, clicks or gallops Abdomen - soft, nontender, nondistended, no masses or organomegaly Musculoskeletal - ttp over patella and medial knee, otherwise no joint tenderness, deformity or swelling Extremities - peripheral pulses normal, no pedal edema, no clubbing or cyanosis Skin - normal coloration and turgor, no rashes Psych- flat affect, calm and cooperative  ED Course  Procedures (including critical care time) Labs Review Labs Reviewed  ACETAMINOPHEN LEVEL - Abnormal; Notable for the following:    Acetaminophen (Tylenol), Serum <10 (*)    All other components within normal limits  CBC - Abnormal; Notable for the  following:    WBC 12.2 (*)    RBC 3.85 (*)    Hemoglobin 11.8 (*)    HCT 35.1 (*)    All other components within normal limits  COMPREHENSIVE METABOLIC PANEL - Abnormal; Notable for the following:    Potassium 3.3 (*)    Chloride 100 (*)    Glucose, Bld 117 (*)    Calcium 8.4 (*)    ALT 14 (*)    Total Bilirubin 1.3 (*)    All other components within normal limits  URINE RAPID DRUG SCREEN (HOSP PERFORMED) NOT AT [facility] - Abnormal; Notable for the following:    Cocaine POSITIVE (*)    Benzodiazepines POSITIVE (*)     Tetrahydrocannabinol POSITIVE (*)    All other components within normal limits  ETHANOL  SALICYLATE LEVEL    Imaging Review Dg Knee Complete 4 Views Right  02/22/2015   CLINICAL DATA:  Chronic RIGHT knee pain. Recurrent injury Saturday. Diffuse RIGHT knee pain.  EXAM: RIGHT KNEE - COMPLETE 4+ VIEW  COMPARISON:  None.  FINDINGS: Patellofemoral compartment tricompartmental osteoarthritis. Medial and lateral compartment osteoarthritis is mild. Patellofemoral osteoarthritis is mild to moderate. No definite effusion. The alignment of the knee is anatomic. Negative for fracture.  IMPRESSION: No acute osseous abnormality. Tricompartmental osteoarthritis of the RIGHT knee.   Electronically Signed   By: Andreas Newport M.D.   On: 02/22/2015 15:54     EKG Interpretation None      MDM   Final diagnoses:  Suicidal ideation  Knee pain, unspecified laterality  Substance abuse   Pt has had psych holding orders written, he is here voluntarily at this time.  Xray of knee shows osteoarthritis but no other acute injury.   Xray images reviewed and interpreted by me as well.   5:25 PM TTS has evaluated patient and is recommending inpatient admission.  They are looking for placement at this time.  Psych holding orders written.     Jerelyn Scott, MD 02/22/15 [phone]

## 2015-02-22 NOTE — BH Assessment (Signed)
[facility] Assessment Progress Note  Called [facility] and scheduled pt's tele assessment with this clinician.  Attempted to talk with EDP Linker, but she is with a pt.  Will call back after seeing pt.  Casimer LaniusKristen Lester Platas, MS, [facility] Therapeutic Triage Specialist [facility]

## 2015-02-22 NOTE — ED Notes (Signed)
Bed: [id number] Expected date:  Expected time:  Means of arrival:  Comments: Hold for Triage 3

## 2015-02-22 NOTE — BH Assessment (Addendum)
Tele Assessment Note   [name] [name] is an 54 y.o. male that [facility] after reporting that he hurt his hip and knee from falling down a hill.  Pt medically cleared.  Pt reports he has SI with a plan to overdose on medications because of his medical problems.  "I am disgusted and I want to end my life."  Pt denies HI or AVH.  No delusions noted.  Pt reports depressive sx and anxiety.  Pt reports daily use of alcohol and reports he drinks a fifth of liquor daily, last use was last night, uses cocaine once weekly, and uses marijuana daily.  Pt reports tremor as a withdrawal sx currently.  Pt stated he went into DTs 8 weeks ago.  Pt denies hx of seizures, reports hx of blackouts.  Pt reports hx of suicidal gesture.  [facility].  Pt was prescribed medications but reported never took them.  He stated he did not follow up with outpatient services.  Pt has no support and is homeless.  Inpatient treatment is recommended for the pt.  Consulted with Nanine MeansJamison Lord, DNP who recommends inpatient treatment.  Updated EDP [name] [name], who was in agreement with pt disposition.  TTS to seek placement for the pt.  Updated TTS and ED staff.  Axis I: 296.34 Major Depressive Disorder, Recurrent Episode, Severe, 305.60 Cocaine Use Disorder, Mild, 304.30 Cannabis Use Disorder, Severe, 303.90 Alcohol Use Disorder, Severe Axis II: Deferred Axis III:  Past Medical History  Diagnosis Date  . Alcoholic   . Peripheral neuropathy   . Depression   . Anxiety    Axis IV: economic problems, housing problems, occupational problems, other psychosocial or environmental problems, problems related to legal system/crime, problems related to social environment, problems with access to health care services and problems with primary support group Axis V: 21-30 behavior considerably influenced by delusions or hallucinations OR serious impairment in judgment, communication OR inability to function in  almost all areas  Past Medical History:  Past Medical History  Diagnosis Date  . Alcoholic   . Peripheral neuropathy   . Depression   . Anxiety     Past Surgical History  Procedure Laterality Date  . Hip surgery      Family History: No family history on file.  Social History:  reports that he has been smoking Cigarettes.  He has a 7.5 pack-year smoking history. He does not have any smokeless tobacco history on file. He reports that he drinks alcohol. He reports that he uses illicit drugs (Cocaine and Marijuana).  Additional Social History:  Alcohol / Drug Use Pain Medications: none per pt Prescriptions: none per pt Over the Counter: none per pt History of alcohol / drug use?: Yes Longest period of sobriety (when/how long): unknoen Negative Consequences of Use: Financial, Armed forces operational officerLegal, Personal relationships, Work / School Withdrawal Symptoms: Tremors, Patient aware of relationship between substance abuse and physical/medical complications Substance #1 Name of Substance 1: Alcohol 1 - Age of First Use: 11 1 - Amount (size/oz): one fifth 1 - Frequency: daily 1 - Duration: ongoing 1 - Last Use / Amount: last night-one fifth liquor Substance #2 Name of Substance 2: Cocaine 2 - Age of First Use: 35 2 - Amount (size/oz): 2.5 grams 2 - Frequency: once per week 2 - Duration: ongoing 2 - Last Use / Amount: 3 days ago-2.5 grams Substance #3 Name of Substance 3: Marijuana 3 - Age of First Use: 11 3 - Amount (  size/oz): 1 joint 3 - Frequency: daily 3 - Duration: ongoing 3 - Last Use / Amount: 2 days ago-1 joint  CIWA: CIWA-Ar BP: 113/72 mmHg Pulse Rate: 101 Nausea and Vomiting: no nausea and no vomiting Tactile Disturbances: none Tremor: three Auditory Disturbances: not present Paroxysmal Sweats: no sweat visible Visual Disturbances: not present Anxiety: two Headache, Fullness in Head: none present Agitation: normal activity Orientation and Clouding of Sensorium: oriented  and can do serial additions CIWA-Ar Total: 5 COWS:    PATIENT STRENGTHS: (choose at least two) Average or above average intelligence General fund of knowledge  Allergies:  Allergies  Allergen Reactions  . Nsaids Nausea And Vomiting and Other (See Comments)    Severe cramping  . Ketorolac Other (See Comments)  . Pantoprazole Sodium Hives    Home Medications:  (Not in a hospital admission)  OB/GYN Status:  No LMP for male patient.  General Assessment Data Location of Assessment: [facility] TTS Assessment: In system Is this a Tele or Face-to-Face Assessment?: Tele Assessment Is this an Initial Assessment or a Re-assessment for this encounter?: Initial Assessment Marital status: Single Maiden name:  (na) Is patient pregnant?:  (na) Pregnancy Status:  (na) Living Arrangements: Other (Comment) (Homeless) Can pt return to current living arrangement?: Yes Admission Status: Voluntary Is patient capable of signing voluntary admission?: Yes Referral Source: Self/Family/Friend Insurance type: none  Medical Screening Exam [facility] Walk-in ONLY) Medical Exam completed:  (na)  Crisis Care Plan Living Arrangements: Other (Comment) (Homeless) Name of Psychiatrist: none Name of Therapist: none  Education Status Is patient currently in school?: No Current Grade: na Highest grade of school patient has completed: 8 Name of school: na Contact person: na  Risk to self with the past 6 months Suicidal Ideation: Yes-Currently Present Has patient been a risk to self within the past 6 months prior to admission? : Yes Suicidal Intent: Yes-Currently Present Has patient had any suicidal intent within the past 6 months prior to admission? : Yes Is patient at risk for suicide?: Yes Suicidal Plan?: Yes-Currently Present Has patient had any suicidal plan within the past 6 months prior to admission? : Yes Specify Current Suicidal Plan: to overdose on medications Access to Means: Yes Specify Access  to Suicidal Means: can access medications What has been your use of drugs/alcohol within the last 12 months?: pt reports alcohol, cocaine, and marijuana use Previous Attempts/Gestures: Yes How many times?: 1 Other Self Harm Risks: pt denies Triggers for Past Attempts: Other (Comment) (No support) Intentional Self Injurious Behavior: None Family Suicide History: No Recent stressful life event(s): Financial Problems, Legal Issues, Recent negative physical changes, Other (Comment) (SI, HI, medical, SA, no support, homeless, court date) Persecutory voices/beliefs?: No Depression: Yes Depression Symptoms: Despondent, Insomnia, Isolating, Loss of interest in usual pleasures, Feeling worthless/self pity, Feeling angry/irritable Substance abuse history and/or treatment for substance abuse?: Yes Suicide prevention information given to non-admitted patients: Not applicable  Risk to Others within the past 6 months Homicidal Ideation: No Does patient have any lifetime risk of violence toward others beyond the six months prior to admission? : No Thoughts of Harm to Others: No Current Homicidal Intent: No Current Homicidal Plan: No Access to Homicidal Means: No Identified Victim: na-pt denies History of harm to others?: Yes Assessment of Violence: In distant past Violent Behavior Description: reports getting into fights in past Does patient have access to weapons?: No Criminal Charges Pending?: Yes Describe Pending Criminal Charges: panhandling Does patient have a court date: Yes Court Date:  (  June 2016) Is patient on probation?: No  Psychosis Hallucinations: None noted Delusions: None noted  Mental Status Report Appearance/Hygiene: Disheveled, In scrubs Eye Contact: Good Motor Activity: Tremors Speech: Logical/coherent, Soft Level of Consciousness: Alert Mood: Depressed, Anxious Affect: Appropriate to circumstance Anxiety Level: Panic Attacks Panic attack frequency: pt states he  cannot recall Most recent panic attack: pt cannot recall Thought Processes: Coherent, Relevant Judgement: Impaired Orientation: Person, Place, Time, Situation Obsessive Compulsive Thoughts/Behaviors: None  Cognitive Functioning Concentration: Normal Memory: Recent Intact, Remote Intact IQ: Average Insight: Fair Impulse Control: Poor Appetite: Poor Weight Loss:  (pt is unsure of amount, but reports weight loss) Weight Gain: 0 Sleep: Decreased Total Hours of Sleep:  (varies-wakes hourly) Vegetative Symptoms: Not bathing, Decreased grooming  ADLScreening Assurance [facility]) Patient's cognitive ability adequate to safely complete daily activities?: Yes Patient able to express need for assistance with ADLs?: Yes Independently performs ADLs?: Yes (appropriate for developmental age)  Prior Inpatient Therapy Prior Inpatient Therapy: Yes Prior Therapy Dates: [facility] Prior Therapy Facilty/Provider(s): 10/2014 Reason for Treatment: SI/SA  Prior Outpatient Therapy Prior Outpatient Therapy: Yes Prior Therapy Dates: Pt cannot recall Prior Therapy Facilty/Provider(s): [facility] per pt Reason for Treatment: med mgnt Does patient have an ACCT team?: No Does patient have Intensive In-House Services?  : No Does patient have [facility] services? : No Does patient have P4CC services?: No  ADL Screening (condition at time of admission) Patient's cognitive ability adequate to safely complete daily activities?: Yes Is the patient deaf or have difficulty hearing?: No Does the patient have difficulty seeing, even when wearing glasses/contacts?: No Does the patient have difficulty concentrating, remembering, or making decisions?: No Patient able to express need for assistance with ADLs?: Yes Does the patient have difficulty dressing or bathing?: No Independently performs ADLs?: Yes (appropriate for developmental age) Does the patient have difficulty walking or climbing stairs?: No  Home  Assistive Devices/Equipment Home Assistive Devices/Equipment: None    Abuse/Neglect Assessment (Assessment to be complete while patient is alone) Physical Abuse: Denies Verbal Abuse: Denies Sexual Abuse: Denies Exploitation of patient/patient's resources: Denies Self-Neglect: Denies Values / Beliefs Cultural Requests During Hospitalization: None Spiritual Requests During Hospitalization: None Consults Spiritual Care Consult Needed: No Social Work Consult Needed: No Merchant navy officer (For Healthcare) Does patient have an advance directive?: No Would patient like information on creating an advanced directive?: No - patient declined information    Additional Information 1:1 In Past 12 Months?: No CIRT Risk: No Elopement Risk: No Does patient have medical clearance?: Yes     Disposition:  Disposition Initial Assessment Completed for this Encounter: Yes Disposition of Patient: Referred to, Inpatient treatment program Type of inpatient treatment program: Adult  [name] Lanius, MS, [facility] Therapeutic Triage Specialist [facility]   02/22/2015 5:21 PM

## 2015-02-23 DIAGNOSIS — F1910 Other psychoactive substance abuse, uncomplicated: Secondary | ICD-10-CM | POA: Insufficient documentation

## 2015-02-23 DIAGNOSIS — R45851 Suicidal ideations: Secondary | ICD-10-CM | POA: Diagnosis not present

## 2015-02-23 DIAGNOSIS — F1020 Alcohol dependence, uncomplicated: Secondary | ICD-10-CM | POA: Diagnosis not present

## 2015-02-23 MED ORDER — BENZONATATE 100 MG PO CAPS
200.0000 mg | ORAL_CAPSULE | Freq: Three times a day (TID) | ORAL | Status: DC | PRN
  Administered 2015-02-23 – 2015-02-25 (×4): 200 mg via ORAL
  Filled 2015-02-23 (×4): qty 2

## 2015-02-23 MED ORDER — BENZONATATE 100 MG PO CAPS
200.0000 mg | ORAL_CAPSULE | Freq: Once | ORAL | Status: AC
  Administered 2015-02-23: 200 mg via ORAL
  Filled 2015-02-23: qty 2

## 2015-02-23 NOTE — ED Notes (Signed)
Patient is resting comfortably. 

## 2015-02-23 NOTE — Consult Note (Signed)
[facility] Face-to-Face Psychiatry Consult   Reason for Consult:  Major Depressive Disorder, Recurrent Episode, Severe, Cocaine Use Disorder, Mild, Cannabis Use Disorder, Severe, Alcohol Use Disorder, Severe Referring Physician:  EDP Patient Identification: Gerrard Crystal MRN:  [id number] Principal Diagnosis: Alcohol use disorder, severe, dependence Diagnosis:   Patient Active Problem List   Diagnosis Date Noted  . Alcohol use disorder, severe, dependence [F10.20] 10/26/2014    Priority: High  . Sprained ankle [S93.409A] 10/28/2014  . 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 Cannabis use disorder, severe, dependence [F12.20] 10/26/2014  . Gastrointestinal bleeding, lower [K92.2] 02/14/2014    Total Time spent with patient: 1 hour  Subjective:   Kipper Buch is a 54 y.o. male patient admitted with Major Depressive Disorder, Recurrent Episode, Severe,  Cocaine Use Disorder, Mild, Cannabis Use Disorder, Severe, Alcohol Use Disorder, Severe  HPI: Caucasian male, 54 years old was evaluated for excessive Alcohol use.  He reports that he has been feeling suicidal and sad because of his medical illness.  Patient reports that his drinking is affecting his over all health.  He reports recent admission at [facility] for Alcohol detox and was discharged 3 days ago.  Patient reports using Marijuana and Cocaine also.  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  He reports using alcohol and other substances to treat his depression brought in by hip pain.  Patient reports drinking a fifth  a day.  Patient admits to a diagnosis of Bipolar disorder and stated that he has not been compliant with his medications.  He reports poor sleep and appetite and states that he is homeless.  Patient reports feeling hopeless, helpless and suicidal with no plans.  He has been accepted for admission and we will be  seeking placement at any facility with available beds.  HPI Elements:   Location:  Major depressive disorder, recurrent severe, Cocaine use disorder, mild, Alcohol use disorder, Severe, Marijuana use disorder, severe. Quality:  severe, feelings of hopelessness and helplessness, suicidal ideation. Severity:  severe. Timing:  acute. Duration:  Chronic mental and medical illness. Context:  Came in seeking detox from all substance.  Past Medical History:  Past Medical History  Diagnosis Date  . Alcoholic   . Peripheral neuropathy   . Depression   . Anxiety     Past Surgical History  Procedure Laterality Date  . Hip surgery     Family History: No family history on file. Social History:  History  Alcohol Use  . 0.0 oz/week    Comment: 1/5 daily     History  Drug Use  . Yes  . Special: Cocaine, Marijuana    History   Social History  . Marital Status: Single    Spouse Name: N/A  . Number of Children: N/A  . Years of Education: N/A   Social History Main Topics  . Smoking status: Current Every Day Smoker -- 0.50 packs/day for 15 years    Types: Cigarettes  . Smokeless tobacco: Not on file  . Alcohol Use: 0.0 oz/week     Comment: 1/5 daily  . Drug Use: Yes    Special: Cocaine, Marijuana  . Sexual Activity: No   Other Topics Concern  . None   Social History Narrative   Additional Social History:    Pain Medications: none per pt Prescriptions: none per pt Over the Counter: none per pt History of alcohol / drug use?: Yes  Longest period of sobriety (when/how long): unknoen Negative Consequences of Use: Financial, Scientist, research (physical sciences), Personal relationships, Work / School Withdrawal Symptoms: Tremors, Patient aware of relationship between substance abuse and physical/medical complications Name of Substance 1: Alcohol 1 - Age of First Use: 11 1 - Amount (size/oz): one fifth 1 - Frequency: daily 1 - Duration: ongoing 1 - Last Use / Amount: last night-one fifth liquor Name of  Substance 2: Cocaine 2 - Age of First Use: 35 2 - Amount (size/oz): 2.5 grams 2 - Frequency: once per week 2 - Duration: ongoing 2 - Last Use / Amount: 3 days ago-2.5 grams Name of Substance 3: Marijuana 3 - Age of First Use: 11 3 - Amount (size/oz): 1 joint 3 - Frequency: daily 3 - Duration: ongoing 3 - Last Use / Amount: 2 days ago-1 joint               Allergies:   Allergies  Allergen Reactions  . Nsaids Nausea And Vomiting and Other (See Comments)    Severe cramping  . Ketorolac Other (See Comments)  . Pantoprazole Sodium Hives    Labs:  Results for orders placed or performed during the hospital encounter of 02/22/15 (from the past 48 hour(s))  CBC     Status: Abnormal   Collection Time: 02/22/15  3:00 PM  Result Value Ref Range   WBC 12.2 (H) 4.0 - 10.5 K/uL   RBC 3.85 (L) 4.22 - 5.81 MIL/uL   Hemoglobin 11.8 (L) 13.0 - 17.0 g/dL   HCT 35.1 (L) 39.0 - 52.0 %   MCV 91.2 78.0 - 100.0 fL   MCH 30.6 26.0 - 34.0 pg   MCHC 33.6 30.0 - 36.0 g/dL   RDW 13.8 11.5 - 15.5 %   Platelets 165 150 - 400 K/uL  Comprehensive metabolic panel     Status: Abnormal   Collection Time: 02/22/15  3:00 PM  Result Value Ref Range   Sodium 136 135 - 145 mmol/L   Potassium 3.3 (L) 3.5 - 5.1 mmol/L   Chloride 100 (L) 101 - 111 mmol/L   CO2 26 22 - 32 mmol/L   Glucose, Bld 117 (H) 65 - 99 mg/dL   BUN 11 6 - 20 mg/dL   Creatinine, Ser 0.70 0.61 - 1.24 mg/dL   Calcium 8.4 (L) 8.9 - 10.3 mg/dL   Total Protein 7.3 6.5 - 8.1 g/dL   Albumin 4.0 3.5 - 5.0 g/dL   AST 22 15 - 41 U/L   ALT 14 (L) 17 - 63 U/L   Alkaline Phosphatase 87 38 - 126 U/L   Total Bilirubin 1.3 (H) 0.3 - 1.2 mg/dL   GFR calc non Af Amer >60 >60 mL/min   GFR calc Af Amer >60 >60 mL/min    Comment: (NOTE) The eGFR has been calculated using the CKD EPI equation. This calculation has not been validated in all clinical situations. eGFR's persistently <60 mL/min signify possible Chronic Kidney Disease.    Anion gap  10 5 - 15  Acetaminophen level     Status: Abnormal   Collection Time: 02/22/15  3:08 PM  Result Value Ref Range   Acetaminophen (Tylenol), Serum <10 (L) 10 - 30 ug/mL    Comment:        THERAPEUTIC CONCENTRATIONS VARY SIGNIFICANTLY. A RANGE OF 10-30 ug/mL MAY BE AN EFFECTIVE CONCENTRATION FOR MANY PATIENTS. HOWEVER, SOME ARE BEST TREATED AT CONCENTRATIONS OUTSIDE THIS RANGE. ACETAMINOPHEN CONCENTRATIONS >150 ug/mL AT 4 HOURS AFTER INGESTION AND >50 ug/mL  AT 12 HOURS AFTER INGESTION ARE OFTEN ASSOCIATED WITH TOXIC REACTIONS.   Ethanol (ETOH)     Status: None   Collection Time: 02/22/15  3:08 PM  Result Value Ref Range   Alcohol, Ethyl (B) <5 <5 mg/dL    Comment:        LOWEST DETECTABLE LIMIT FOR SERUM ALCOHOL IS 5 mg/dL FOR MEDICAL PURPOSES ONLY   Salicylate level     Status: None   Collection Time: 02/22/15  3:08 PM  Result Value Ref Range   Salicylate Lvl <4.1 2.8 - 30.0 mg/dL  Urine rapid drug screen (hosp performed)not at [facility]     Status: Abnormal   Collection Time: 02/22/15  4:32 PM  Result Value Ref Range   Opiates NONE DETECTED NONE DETECTED   Cocaine POSITIVE (A) NONE DETECTED   Benzodiazepines POSITIVE (A) NONE DETECTED   Amphetamines NONE DETECTED NONE DETECTED   Tetrahydrocannabinol POSITIVE (A) NONE DETECTED   Barbiturates NONE DETECTED NONE DETECTED    Comment:        DRUG SCREEN FOR MEDICAL PURPOSES ONLY.  IF CONFIRMATION IS NEEDED FOR ANY PURPOSE, NOTIFY LAB WITHIN 5 DAYS.        LOWEST DETECTABLE LIMITS FOR URINE DRUG SCREEN Drug Class       Cutoff (ng/mL) Amphetamine      1000 Barbiturate      200 Benzodiazepine   324 Tricyclics       401 Opiates          300 Cocaine          300 THC              50     Vitals: Blood pressure 121/78, pulse 93, temperature 98.8 F (37.1 C), temperature source Oral, resp. rate 20, SpO2 93 %.  Risk to Self: Suicidal Ideation: Yes-Currently Present Suicidal Intent: Yes-Currently Present Is patient at  risk for suicide?: Yes Suicidal Plan?: Yes-Currently Present Specify Current Suicidal Plan: to overdose on medications Access to Means: Yes Specify Access to Suicidal Means: can access medications What has been your use of drugs/alcohol within the last 12 months?: pt reports alcohol, cocaine, and marijuana use How many times?: 1 Other Self Harm Risks: pt denies Triggers for Past Attempts: Other (Comment) (No support) Intentional Self Injurious Behavior: None Risk to Others: Homicidal Ideation: No Thoughts of Harm to Others: No Current Homicidal Intent: No Current Homicidal Plan: No Access to Homicidal Means: No Identified Victim: na-pt denies History of harm to others?: Yes Assessment of Violence: In distant past Violent Behavior Description: reports getting into fights in past Does patient have access to weapons?: No Criminal Charges Pending?: Yes Describe Pending Criminal Charges: panhandling Does patient have a court date: Yes Court Date:  (June 2016) Prior Inpatient Therapy: Prior Inpatient Therapy: Yes Prior Therapy Dates: [facility] Prior Therapy Facilty/Provider(s): 10/2014 Reason for Treatment: SI/SA Prior Outpatient Therapy: Prior Outpatient Therapy: Yes Prior Therapy Dates: Pt cannot recall Prior Therapy Facilty/Provider(s): [facility] per pt Reason for Treatment: med mgnt Does patient have an ACCT team?: No Does patient have Intensive In-House Services?  : No Does patient have [facility] services? : No Does patient have P4CC services?: No  Current Facility-Administered Medications  Medication Dose Route Frequency Provider Last Rate Last Dose  . acetaminophen (TYLENOL) tablet 650 mg  650 mg Oral Q4H PRN Alfonzo Beers, MD      . alum & mag hydroxide-simeth (MAALOX/MYLANTA) 200-200-20 MG/5ML suspension 30 mL  30 mL Oral PRN Alfonzo Beers, MD      .  LORazepam (ATIVAN) tablet 0-4 mg  0-4 mg Oral 4 times per day Alfonzo Beers, MD   0 mg at 02/22/15 1841   Followed by  . [START  ON 02/24/2015] LORazepam (ATIVAN) tablet 0-4 mg  0-4 mg Oral Q12H Alfonzo Beers, MD      . LORazepam (ATIVAN) tablet 1 mg  1 mg Oral Q8H PRN Alfonzo Beers, MD   1 mg at 02/22/15 1827  . nicotine (NICODERM CQ - dosed in mg/24 hours) patch 21 mg  21 mg Transdermal Daily Alfonzo Beers, MD   21 mg at 02/23/15 0925  . ondansetron (ZOFRAN) tablet 4 mg  4 mg Oral Q8H PRN Alfonzo Beers, MD      . thiamine (VITAMIN B-1) tablet 100 mg  100 mg Oral Daily Alfonzo Beers, MD   100 mg at 02/23/15 0960   Or  . thiamine (B-1) injection 100 mg  100 mg Intravenous Daily Alfonzo Beers, MD      . zolpidem (AMBIEN) tablet 5 mg  5 mg Oral QHS PRN Alfonzo Beers, MD       Current Outpatient Prescriptions  Medication Sig Dispense Refill  . folic acid (FOLVITE) 1 MG tablet Take 1 tablet (1 mg total) by mouth daily. (Patient not taking: Reported on 02/22/2015)    . gabapentin (NEURONTIN) 300 MG capsule Take 1 capsule (300 mg total) by mouth 3 (three) times daily. (Patient not taking: Reported on 02/22/2015) 90 capsule 0  . Multiple Vitamin (MULTIVITAMIN WITH MINERALS) TABS tablet Take 1 tablet by mouth daily. (Patient not taking: Reported on 02/22/2015)    . traZODone (DESYREL) 50 MG tablet Take 1 tablet (50 mg total) by mouth at bedtime and may repeat dose one time if needed. (Patient not taking: Reported on 02/22/2015) 60 tablet 0    Musculoskeletal: Strength & Muscle Tone: unable to ambulate due to rt hip pain Gait & Station: unable to ambulate due to right hip pain Patient leans: unable to ambulate  Psychiatric Specialty Exam: Physical Exam  ROS  Blood pressure 121/78, pulse 93, temperature 98.8 F (37.1 C), temperature source Oral, resp. rate 20, SpO2 93 %.There is no weight on file to calculate BMI.  General Appearance: Casual and Disheveled  Eye Contact::  Minimal  Speech:  Clear and Coherent and Normal Rate  Volume:  Normal  Mood:  Angry, Anxious, Depressed, Hopeless, Worthless and helpless  Affect:  Congruent,  Depressed, Flat and Tearful  Thought Process:  Coherent, Goal Directed and Intact  Orientation:  Full (Time, Place, and Person)  Thought Content:  WDL  Suicidal Thoughts:  Yes.  without intent/plan  Homicidal Thoughts:  No  Memory:  Immediate;   Good Recent;   Good Remote;   Good  Judgement:  Impaired  Insight:  Fair  Psychomotor Activity:  Psychomotor Retardation  Concentration:  Fair  Recall:  NA  Fund of Knowledge:Fair  Language: Good  Akathisia:  NA  Handed:  Right  AIMS (if indicated):     Assets:  Desire for Improvement  ADL's:  Impaired  Cognition: WNL  Sleep:      Medical Decision Making: Review of Psycho-Social Stressors (1), Established Problem, Worsening (2), Review of Medication Regimen & Side Effects (2) and Review of New Medication or Change in Dosage (2)  Treatment Plan Summary: Daily contact with patient to assess and evaluate symptoms and progress in treatment and Medication management  Plan:  Recommend psychiatric Inpatient admission when medically cleared.initiate Ativan detox protocol for his detox. Disposition: Admit  Delfin Gant   PMHNP-BC 02/23/2015 4:08 PM Patient seen face-to-face for psychiatric evaluation, chart reviewed and case discussed with the physician extender and developed treatment plan. Reviewed the information documented and agree with the treatment plan. Corena Pilgrim, MD

## 2015-02-23 NOTE — BH Assessment (Addendum)
[facility] Assessment Progress Note  The following facilities have been contacted to seek placement for this pt, with results as noted:  Beds available, information sent, decision pending:  [facility]  At [facility]  [facility]   [place] Rashied [place], [place] Triage Specialist [phone]

## 2015-02-24 DIAGNOSIS — F1910 Other psychoactive substance abuse, uncomplicated: Secondary | ICD-10-CM

## 2015-02-24 DIAGNOSIS — R45851 Suicidal ideations: Secondary | ICD-10-CM
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
## 2015-02-25 DIAGNOSIS — F331 Major depressive disorder, recurrent, moderate: Secondary | ICD-10-CM | POA: Insufficient documentation

## 2015-02-25 MED ORDER — TRAZODONE HCL 50 MG PO TABS: 50.0000 mg | ORAL_TABLET | Freq: Every evening | ORAL | Status: DC | PRN

## 2015-02-25 MED ORDER — POTASSIUM CHLORIDE 20 MEQ/15ML (10%) PO SOLN
20.0000 meq | Freq: Once | ORAL | Status: AC
Start: 2015-02-25 — End: 2015-02-25
  Administered 2015-02-25: 20 meq via ORAL
  Filled 2015-02-25: qty 15

## 2015-02-25 MED ORDER — MENTHOL 3 MG MT LOZG
1.0000 | LOZENGE | OROMUCOSAL | Status: DC | PRN
  Administered 2015-02-25: 3 mg via ORAL
  Filled 2015-02-25: qty 9

## 2015-02-25 NOTE — ED Notes (Signed)
Called PT at 1100 sending PT for consult. [phone]

## 2015-02-25 NOTE — Evaluation (Signed)
Physical Therapy Evaluation Patient Details Name: [name] [name] MRN: [id number] DOB: 1960-11-08 Today's Date: 02/25/2015   History of Present Illness  54 yo male admitted with ETOH use d/o, suicial thouoghts, R LE pain. hx of neuropathy, ETOH abuse, drug abuse, bipolar d/o. Pt is homeless  Clinical Impression  On eval, pt was supervision-Min guard assist for mobility-able to ambulate ~75 feet with RW. Pain rated "11" at rest however pt tolerated ambulation very well. No LOB with use of RW. Pt reports he is homeless and that he does not have any DME. Recommend RW at discharge. Unfortunately pt does not have any means for follow up PT at discharge. Pt is unsure of where he will discharge from here. Will follow during hospital stay.     Follow Up Recommendations No PT follow up (pt is homeless and does not have any means to have PT at discharge)    Equipment Recommendations  Rolling walker with 5" wheels    Recommendations for Other Services       Precautions / Restrictions Precautions Precautions: None Restrictions Weight Bearing Restrictions: No      Mobility  Bed Mobility Overal bed mobility: Modified Independent                Transfers Overall transfer level: Needs assistance   Transfers: Sit to/from Stand Sit to Stand: Min guard         General transfer comment: close guard for safety. VCs safety, hand placement  Ambulation/Gait Ambulation/Gait assistance: Supervision Ambulation Distance (Feet): 75 Feet Assistive device: Rolling walker (2 wheeled) Gait Pattern/deviations: Step-to pattern     General Gait Details: Pt using PWB status on R LE while ambulating. Pt reports pain at rest and with activity. No LOB. Good use of walker  Stairs            Wheelchair Mobility    Modified Rankin (Stroke Patients Only)       Balance Overall balance assessment: Needs assistance         Standing balance support: Bilateral upper extremity  supported;During functional activity Standing balance-Leahy Scale: Poor Standing balance comment: needs external support of walker                             Pertinent Vitals/Pain Pain Assessment: Faces Faces Pain Scale: Hurts even more Pain Location: R LE at rest and with activity Pain Intervention(s): Limited activity within patient's tolerance    Home Living Family/patient expects to be discharged to:: Shelter/Homeless                      Prior Function                 Hand Dominance        Extremity/Trunk Assessment   Upper Extremity Assessment: Overall WFL for tasks assessed           Lower Extremity Assessment: RLE deficits/detail RLE Deficits / Details: knee ext at least 4/5. Limited hip ROM; strength at least 3-/5.     Cervical / Trunk Assessment: Normal  Communication   Communication: No difficulties  Cognition Arousal/Alertness: Awake/alert Behavior During Therapy: WFL for tasks assessed/performed Overall Cognitive Status: Within Functional Limits for tasks assessed                      General Comments      Exercises        Assessment/Plan  PT Assessment Patient needs continued PT services  PT Diagnosis Difficulty walking;Abnormality of gait (chronic R hip/LE pain since surgery in 90s per pt)   PT Problem List Decreased balance;Decreased mobility;Pain;Decreased knowledge of use of DME  PT Treatment Interventions Gait training;DME instruction;Functional mobility training;Therapeutic activities;Therapeutic exercise;Balance training;Patient/family education   PT Goals (Current goals can be found in the Care Plan section) Acute Rehab PT Goals Patient Stated Goal: none stated PT Goal Formulation: With patient Time For Goal Achievement: 03/11/15 Potential to Achieve Goals: Fair    Frequency Min 2X/week   Barriers to discharge        Co-evaluation               End of Session Equipment Utilized  During Treatment: Gait belt Activity Tolerance: Patient tolerated treatment well Patient left: in bed;with call bell/phone within reach      Functional Assessment Tool Used: clinical judgement Functional Limitation: Mobility: Walking and moving around Mobility: Walking and Moving Around Current Status (P5916): At least 1 percent but less than 20 percent impaired, limited or restricted Mobility: Walking and Moving Around Goal Status [phone]): 0 percent impaired, limited or restricted    Time: 1130-1138 PT Time Calculation (min) (ACUTE ONLY): 8 min   Charges:   PT Evaluation $Initial PT Evaluation Tier I: 1 Procedure     PT G Codes:   PT G-Codes **NOT FOR INPATIENT CLASS** Functional Assessment Tool Used: clinical judgement Functional Limitation: Mobility: Walking and moving around Mobility: Walking and Moving Around Current Status (Z9935): At least 1 percent but less than 20 percent impaired, limited or restricted Mobility: Walking and Moving Around Goal Status [phone]): 0 percent impaired, limited or restricted    Rebeca Alert, MPT Pager: [phone]

## 2015-02-25 NOTE — Progress Notes (Signed)
CSW followed up on placement efforts.  Declined: [facility]- per Agustin Cree, due to chronicity  Referral made with updated MD and PT notes: [facility]- per [facility]- per [facility]- per [facility]- per Gwenyth Ober, MSW, LCSWA Clinical Social Work, Disposition  02/25/2015 [phone]

## 2015-02-25 NOTE — Consult Note (Signed)
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 Substance abuse [F19.10]   . Suicidal ideation [R45.851]   . Sprained ankle [S93.409A] 10/28/2014  . 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 Alcohol use disorder, severe, dependence [F10.20] 10/26/2014  . Cannabis use disorder, severe, dependence [F12.20] 10/26/2014  . Gastrointestinal bleeding, lower [K92.2] 02/14/2014    Total Time spent with patient: 25 minutes  Subjective:   [name] [name] is a 54 y.o. male patient admitted with Major Depressive Disorder, Recurrent Episode, Severe,  Cocaine Use Disorder, Mild, Cannabis Use Disorder, Severe, Alcohol Use Disorder, Severe  HPI: Patient seen, interviewed and chart reviewed. He continues to verbalize depressive symptoms, low energy level, anhedonia, and difficulty sleeping. He also continues to have fine hand tremors, anxiety, nausea and headache. He now reports decreased suicidal ideations but denies delusional thinking or psychosis. Patient is homeless after having hip replacement and has continued to drink heavily and abuse cocaine. Patient will benefit from inpatient admission for stabilization.  HPI Elements:   Location:  Major depressive disorder, recurrent severe, Cocaine use disorder, mild, Alcohol use disorder, Severe, Marijuana use disorder, severe. Quality:  severe, feelings of hopelessness and helplessness, suicidal ideation. Severity:  severe. Timing:  acute. Duration:  Chronic mental and medical illness. Context:  Came in seeking detox from all  substance.  Past Medical History:  Past Medical History  Diagnosis Date  . Alcoholic   . Peripheral neuropathy   . Depression   . Anxiety     Past Surgical History  Procedure Laterality Date  . Hip surgery     Family History: No family history on file. Social History:  History  Alcohol Use  . 0.0 oz/week    Comment: 1/5 daily     History  Drug Use  . Yes  . Special: Cocaine, Marijuana    History   Social History  . Marital Status: Single    Spouse Name: N/A  . Number of Children: N/A  . Years of Education: N/A   Social History Main Topics  . Smoking status: Current Every Day Smoker -- 0.50 packs/day for 15 years    Types: Cigarettes  . Smokeless tobacco: Not on file  . Alcohol Use: 0.0 oz/week     Comment: 1/5 daily  . Drug Use: Yes    Special: Cocaine, Marijuana  . Sexual Activity: No   Other Topics Concern  . None   Social History Narrative   Additional Social History:    Pain Medications: none per pt Prescriptions: none per pt Over the Counter: none per pt History of alcohol / drug use?: Yes Longest period of sobriety (when/how long): unknoen Negative Consequences of Use: Financial, Armed forces operational officer, Personal relationships, Work / School Withdrawal Symptoms: Tremors, Patient aware of relationship between substance abuse and physical/medical complications Name of Substance 1: Alcohol 1 - Age of First Use: 11 1 - Amount (size/oz): one fifth 1 - Frequency: daily 1 - Duration: ongoing 1 - Last Use / Amount: last night-one  fifth liquor Name of Substance 2: Cocaine 2 - Age of First Use: 35 2 - Amount (size/oz): 2.5 grams 2 - Frequency: once per week 2 - Duration: ongoing 2 - Last Use / Amount: 3 days ago-2.5 grams Name of Substance 3: Marijuana 3 - Age of First Use: 11 3 - Amount (size/oz): 1 joint 3 - Frequency: daily 3 - Duration: ongoing 3 - Last Use / Amount: 2 days ago-1 joint               Allergies:   Allergies  Allergen Reactions  .  Nsaids Nausea And Vomiting and Other (See Comments)    Severe cramping  . Ketorolac Other (See Comments)  . Pantoprazole Sodium Hives    Labs:  No results found for this or any previous visit (from the past 48 hour(s)).  Vitals: Blood pressure 111/73, pulse 77, temperature 97.7 F (36.5 C), temperature source Oral, resp. rate 16, SpO2 97 %.  Risk to Self: Suicidal Ideation: Yes-Currently Present Suicidal Intent: Yes-Currently Present Is patient at risk for suicide?: Yes Suicidal Plan?: Yes-Currently Present Specify Current Suicidal Plan: to overdose on medications Access to Means: Yes Specify Access to Suicidal Means: can access medications What has been your use of drugs/alcohol within the last 12 months?: pt reports alcohol, cocaine, and marijuana use How many times?: 1 Other Self Harm Risks: pt denies Triggers for Past Attempts: Other (Comment) (No support) Intentional Self Injurious Behavior: None Risk to Others: Homicidal Ideation: No Thoughts of Harm to Others: No Current Homicidal Intent: No Current Homicidal Plan: No Access to Homicidal Means: No Identified Victim: na-pt denies History of harm to others?: Yes Assessment of Violence: In distant past Violent Behavior Description: reports getting into fights in past Does patient have access to weapons?: No Criminal Charges Pending?: Yes Describe Pending Criminal Charges: panhandling Does patient have a court date: Yes Court Date:  (June 2016) Prior Inpatient Therapy: Prior Inpatient Therapy: Yes Prior Therapy Dates: [facility] Prior Therapy Facilty/Provider(s): 10/2014 Reason for Treatment: SI/SA Prior Outpatient Therapy: Prior Outpatient Therapy: Yes Prior Therapy Dates: Pt cannot recall Prior Therapy Facilty/Provider(s): [facility] per pt Reason for Treatment: med mgnt Does patient have an ACCT team?: No Does patient have Intensive In-House Services?  : No Does patient have [facility] services? : No Does patient have  P4CC services?: No  Current Facility-Administered Medications  Medication Dose Route Frequency Provider Last Rate Last Dose  . acetaminophen (TYLENOL) tablet 650 mg  650 mg Oral Q4H PRN Jerelyn Scott, MD   650 mg at 02/24/15 0935  . alum & mag hydroxide-simeth (MAALOX/MYLANTA) 200-200-20 MG/5ML suspension 30 mL  30 mL Oral PRN Jerelyn Scott, MD      . benzonatate (TESSALON) capsule 200 mg  200 mg Oral TID PRN Lorre Nick, MD   200 mg at 02/24/15 2330  . LORazepam (ATIVAN) tablet 0-4 mg  0-4 mg Oral Q12H Jerelyn Scott, MD   1 mg at 02/25/15 0943  . LORazepam (ATIVAN) tablet 1 mg  1 mg Oral Q8H PRN Jerelyn Scott, MD   1 mg at 02/22/15 1827  . menthol-cetylpyridinium (CEPACOL) lozenge 3 mg  1 lozenge Oral PRN Shon Baton, MD   3 mg at 02/25/15 0254  . nicotine (NICODERM CQ - dosed in mg/24 hours) patch 21 mg  21 mg Transdermal Daily Jerelyn Scott, MD   21 mg at 02/25/15 0945  . ondansetron (ZOFRAN) tablet 4 mg  4 mg Oral Q8H PRN Jerelyn Scott, MD   4 mg  at 02/24/15 0935  . potassium chloride (KLOR-CON) packet 20 mEq  20 mEq Oral Once Shandale Malak      . thiamine (VITAMIN B-1) tablet 100 mg  100 mg Oral Daily Jerelyn Scott, MD   100 mg at 02/25/15 9528   Or  . thiamine (B-1) injection 100 mg  100 mg Intravenous Daily Jerelyn Scott, MD      . traZODone (DESYREL) tablet 50 mg  50 mg Oral QHS PRN Mavis Gravelle       Current Outpatient Prescriptions  Medication Sig Dispense Refill  . folic acid (FOLVITE) 1 MG tablet Take 1 tablet (1 mg total) by mouth daily. (Patient not taking: Reported on 02/22/2015)    . gabapentin (NEURONTIN) 300 MG capsule Take 1 capsule (300 mg total) by mouth 3 (three) times daily. (Patient not taking: Reported on 02/22/2015) 90 capsule 0  . Multiple Vitamin (MULTIVITAMIN WITH MINERALS) TABS tablet Take 1 tablet by mouth daily. (Patient not taking: Reported on 02/22/2015)    . traZODone (DESYREL) 50 MG tablet Take 1 tablet (50 mg total) by mouth at bedtime and may repeat  dose one time if needed. (Patient not taking: Reported on 02/22/2015) 60 tablet 0    Musculoskeletal: Strength & Muscle Tone: unable to ambulate due to rt hip pain Gait & Station: unable to ambulate due to right hip pain Patient leans: unable to ambulate  Psychiatric Specialty Exam: Physical Exam  ROS  Blood pressure 111/73, pulse 77, temperature 97.7 F (36.5 C), temperature source Oral, resp. rate 16, SpO2 97 %.There is no weight on file to calculate BMI.  General Appearance: Casual and Disheveled  Eye Contact::  Minimal  Speech:  Clear and Coherent and Normal Rate  Volume:  Normal  Mood:  Angry, Anxious, Depressed, Hopeless, Worthless and helpless  Affect:  Congruent, Depressed, Flat and Tearful  Thought Process:  Coherent, Goal Directed and Intact  Orientation:  Full (Time, Place, and Person)  Thought Content:  WDL  Suicidal Thoughts:  Yes.  without intent/plan  Homicidal Thoughts:  No  Memory:  Immediate;   Good Recent;   Good Remote;   Good  Judgement:  Impaired  Insight:  Fair  Psychomotor Activity:  Psychomotor Retardation  Concentration:  Fair  Recall:  NA  Fund of Knowledge:Fair  Language: Good  Akathisia:  NA  Handed:  Right  AIMS (if indicated):     Assets:  Desire for Improvement  ADL's:  Impaired  Cognition: WNL  Sleep:    poor   Medical Decision Making: Review of Psycho-Social Stressors (1), Established Problem, Worsening (2), Review of Medication Regimen & Side Effects (2) and Review of New Medication or Change in Dosage (2)  Treatment Plan Summary: Daily contact with patient to assess and evaluate symptoms and progress in treatment and Medication management  Plan:  Recommend psychiatric Inpatient admission when medically cleared.  Patient will continue Ativan detox protocol for alcohol. Disposition:  Awaiting inpatient admission.  Reino Lybbert,MD 02/25/2015 10:54 AM

## 2015-02-25 NOTE — Progress Notes (Signed)
Per chart review, patient was seen by PT who is recommending rolling walker on discharge.  EDCM spoke to psych NP Drenda Freeze who placed order for rolling walker and placed case management consult.  EDCM spoke to TCU RN and instructed her to have day shift RN call day shift case manager regarding walker.  Patient does not have insurance, may be candidate for charity program with [facility].  EDCM spoke to patient at bedside. Patient confirms he does not have a pcp or insurance living in [place].  EDCM provide patient with pamphlet to [facility], informed patient of services there and walk in times.  EDCM also provided patient with list of pcps who accept self pay patients, list of discount pharmacies and websites needymeds.org and GoodRX.com for medication assistance, phone number to inquire about the orange card, phone number to inquire about Mediciad, phone number to inquire about the Affordable Care Act, financial resources in the community such as local churches, salvation army, urban ministries, and dental assistance for uninsured patients.  [facility] also informed patient about [facility], provided patient phone number and address to [facility]. Patient thankful for resources.  No further EDCM needs at this time.

## 2015-02-25 NOTE — BH Assessment (Signed)
[facility] Assessment Progress Note  Pt has been referred to [facility] with PT note.  Decision pending.  Doylene Canning, MA Triage Specialist [phone]

## 2015-02-25 NOTE — ED Notes (Signed)
Pt requesting a cough drop for his cough and sore throat, awaiting for medication from pharmacy

## 2015-02-26 DIAGNOSIS — F331 Major depressive disorder, recurrent, moderate: Secondary | ICD-10-CM

## 2015-02-26 LAB — BASIC METABOLIC PANEL
Anion gap: 8 (ref 5–15)
BUN: 12 mg/dL (ref 6–20)
CALCIUM: 8.9 mg/dL (ref 8.9–10.3)
CO2: 24 mmol/L (ref 22–32)
CREATININE: 0.7 mg/dL (ref 0.61–1.24)
Chloride: 103 mmol/L (ref 101–111)
GFR calc non Af Amer: >60 mL/min (ref >60)
Glucose, Bld: 101 mg/dL — ABNORMAL HIGH (ref 65–99)
Potassium: 4 mmol/L (ref 3.5–5.1)
SODIUM: 135 mmol/L (ref 135–145)

## 2015-02-26 NOTE — Consult Note (Signed)
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 Alcohol use disorder, severe, dependence [F10.20] 10/26/2014    Priority: High  . Major depressive disorder, recurrent episode, moderate [F33.1]   . Substance abuse [F19.10]   . Suicidal ideation [R45.851]   . Sprained ankle [S93.409A] 10/28/2014  . 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 Cannabis use disorder, severe, dependence [F12.20] 10/26/2014  . Gastrointestinal bleeding, lower [K92.2] 02/14/2014    Total Time spent with patient: 25 minutes  Subjective:   [name] [name] is a 54 y.o. male patient admitted with Major Depressive Disorder, Recurrent Episode, Severe,  Cocaine Use Disorder, Mild, Cannabis Use Disorder, Severe, Alcohol Use Disorder, Severe  HPI: Patient seen, interviewed and chart reviewed. He continues to verbalize depressive symptoms, low energy level, anhedonia, and difficulty sleeping. He also continues to have fine hand tremors, anxiety, nausea and headache. He now reports decreased suicidal ideations but denies delusional thinking or psychosis. Patient is homeless after having hip replacement and has continued to drink heavily and abuse cocaine. Patient will benefit from inpatient admission for stabilization.  02/26/15:  Reviewed above note with updates.  Today patient denied tremor, shakes or nausea.  Patient has completed his Alcohol detox.  He denies shakes, tremor or nausea.  He reports good sleep and appetite.  Patient denies SI/HI/AVH.  He is still struggling with Mobility.  SW is looking for  shelter that will accommodate with with his walker.    HPI Elements:   Location:  Major depressive disorder, recurrent severe, Cocaine use disorder, mild, Alcohol use disorder, Severe, Marijuana use disorder, severe. Quality:  severe, feelings of hopelessness and helplessness, suicidal ideation. Severity:  severe. Timing:  acute. Duration:  Chronic mental and medical illness. Context:  Came in seeking detox from all substance.  Past Medical History:  Past Medical History  Diagnosis Date  . Alcoholic   . Peripheral neuropathy   . Depression   . Anxiety     Past Surgical History  Procedure Laterality Date  . Hip surgery     Family History: No family history on file. Social History:  History  Alcohol Use  . 0.0 oz/week    Comment: 1/5 daily     History  Drug Use  . Yes  . Special: Cocaine, Marijuana    History   Social History  . Marital Status: Single    Spouse Name: N/A  . Number of Children: N/A  . Years of Education: N/A   Social History Main Topics  . Smoking status: Current Every Day Smoker -- 0.50 packs/day for 15 years    Types: Cigarettes  . Smokeless tobacco: Not on file  . Alcohol Use: 0.0 oz/week     Comment: 1/5 daily  . Drug Use: Yes    Special: Cocaine, Marijuana  . Sexual Activity: No   Other Topics Concern  . None   Social History Narrative   Additional Social History:    Pain Medications: none per pt Prescriptions: none per pt  Over the Counter: none per pt History of alcohol / drug use?: Yes Longest period of sobriety (when/how long): unknoen Negative Consequences of Use: Financial, Scientist, research (physical sciences), Personal relationships, Work / School Withdrawal Symptoms: Tremors, Patient aware of relationship between substance abuse and physical/medical complications Name of Substance 1: Alcohol 1 - Age of First Use: 11 1 - Amount (size/oz): one fifth 1 - Frequency: daily 1 - Duration: ongoing 1 - Last Use / Amount: last night-one fifth liquor Name of  Substance 2: Cocaine 2 - Age of First Use: 35 2 - Amount (size/oz): 2.5 grams 2 - Frequency: once per week 2 - Duration: ongoing 2 - Last Use / Amount: 3 days ago-2.5 grams Name of Substance 3: Marijuana 3 - Age of First Use: 11 3 - Amount (size/oz): 1 joint 3 - Frequency: daily 3 - Duration: ongoing 3 - Last Use / Amount: 2 days ago-1 joint               Allergies:   Allergies  Allergen Reactions  . Nsaids Nausea And Vomiting and Other (See Comments)    Severe cramping  . Ketorolac Other (See Comments)  . Pantoprazole Sodium Hives    Labs:  Results for orders placed or performed during the hospital encounter of 02/22/15 (from the past 48 hour(s))  Basic metabolic panel     Status: Abnormal   Collection Time: 02/26/15 10:00 AM  Result Value Ref Range   Sodium 135 135 - 145 mmol/L   Potassium 4.0 3.5 - 5.1 mmol/L   Chloride 103 101 - 111 mmol/L   CO2 24 22 - 32 mmol/L   Glucose, Bld 101 (H) 65 - 99 mg/dL   BUN 12 6 - 20 mg/dL   Creatinine, Ser 0.70 0.61 - 1.24 mg/dL   Calcium 8.9 8.9 - 10.3 mg/dL   GFR calc non Af Amer >60 >60 mL/min   GFR calc Af Amer >60 >60 mL/min    Comment: (NOTE) The eGFR has been calculated using the CKD EPI equation. This calculation has not been validated in all clinical situations. eGFR's persistently <60 mL/min signify possible Chronic Kidney Disease.    Anion gap 8 5 - 15    Vitals: Blood pressure 103/73, pulse 84, temperature 98.8 F (37.1 C), temperature source Oral, resp. rate 20, SpO2 96 %.  Risk to Self: Suicidal Ideation: Yes-Currently Present Suicidal Intent: Yes-Currently Present Is patient at risk for suicide?: Yes Suicidal Plan?: Yes-Currently Present Specify Current Suicidal Plan: to overdose on medications Access to Means: Yes Specify Access to Suicidal Means: can access medications What has been your use of drugs/alcohol within the last 12 months?: pt reports alcohol, cocaine, and marijuana use How many times?:  1 Other Self Harm Risks: pt denies Triggers for Past Attempts: Other (Comment) (No support) Intentional Self Injurious Behavior: None Risk to Others: Homicidal Ideation: No Thoughts of Harm to Others: No Current Homicidal Intent: No Current Homicidal Plan: No Access to Homicidal Means: No Identified Victim: na-pt denies History of harm to others?: Yes Assessment of Violence: In distant past Violent Behavior Description: reports getting into fights in past Does patient have access to weapons?: No Criminal Charges Pending?: Yes Describe Pending Criminal Charges: panhandling Does patient have a court date: Yes Court Date:  (June 2016) Prior Inpatient Therapy: Prior Inpatient Therapy: Yes Prior Therapy Dates: [facility] Prior Therapy Facilty/Provider(s): 10/2014 Reason for Treatment: SI/SA Prior Outpatient Therapy: Prior Outpatient Therapy: Yes Prior Therapy Dates: Pt cannot recall Prior Therapy Facilty/Provider(s): [facility] per pt  Reason for Treatment: med mgnt Does patient have an ACCT team?: No Does patient have Intensive In-House Services?  : No Does patient have [facility] services? : No Does patient have P4CC services?: No  Current Facility-Administered Medications  Medication Dose Route Frequency Provider Last Rate Last Dose  . acetaminophen (TYLENOL) tablet 650 mg  650 mg Oral Q4H PRN Alfonzo Beers, MD   650 mg at 02/24/15 0935  . alum & mag hydroxide-simeth (MAALOX/MYLANTA) 200-200-20 MG/5ML suspension 30 mL  30 mL Oral PRN Alfonzo Beers, MD      . benzonatate (TESSALON) capsule 200 mg  200 mg Oral TID PRN Lacretia Leigh, MD   200 mg at 02/25/15 2007  . LORazepam (ATIVAN) tablet 0-4 mg  0-4 mg Oral Q12H Alfonzo Beers, MD   1 mg at 02/25/15 0943  . LORazepam (ATIVAN) tablet 1 mg  1 mg Oral Q8H PRN Alfonzo Beers, MD   1 mg at 02/26/15 0928  . menthol-cetylpyridinium (CEPACOL) lozenge 3 mg  1 lozenge Oral PRN Merryl Hacker, MD   3 mg at 02/25/15 0254  . nicotine (NICODERM CQ -  dosed in mg/24 hours) patch 21 mg  21 mg Transdermal Daily Alfonzo Beers, MD   21 mg at 02/26/15 0928  . ondansetron (ZOFRAN) tablet 4 mg  4 mg Oral Q8H PRN Alfonzo Beers, MD   4 mg at 02/24/15 0935  . thiamine (VITAMIN B-1) tablet 100 mg  100 mg Oral Daily Alfonzo Beers, MD   100 mg at 02/25/15 1700   Or  . thiamine (B-1) injection 100 mg  100 mg Intravenous Daily Alfonzo Beers, MD      . traZODone (DESYREL) tablet 50 mg  50 mg Oral QHS PRN Mojeed Akintayo       Current Outpatient Prescriptions  Medication Sig Dispense Refill  . folic acid (FOLVITE) 1 MG tablet Take 1 tablet (1 mg total) by mouth daily. (Patient not taking: Reported on 02/22/2015)    . gabapentin (NEURONTIN) 300 MG capsule Take 1 capsule (300 mg total) by mouth 3 (three) times daily. (Patient not taking: Reported on 02/22/2015) 90 capsule 0  . Multiple Vitamin (MULTIVITAMIN WITH MINERALS) TABS tablet Take 1 tablet by mouth daily. (Patient not taking: Reported on 02/22/2015)    . traZODone (DESYREL) 50 MG tablet Take 1 tablet (50 mg total) by mouth at bedtime and may repeat dose one time if needed. (Patient not taking: Reported on 02/22/2015) 60 tablet 0    Musculoskeletal: Strength & Muscle Tone: unable to ambulate due to rt hip pain Gait & Station: unable to ambulate due to right hip pain Patient leans: unable to ambulate  Psychiatric Specialty Exam: Physical Exam  ROS  Blood pressure 103/73, pulse 84, temperature 98.8 F (37.1 C), temperature source Oral, resp. rate 20, SpO2 96 %.There is no weight on file to calculate BMI.  General Appearance: Casual and Disheveled  Eye Contact::  Minimal  Speech:  Clear and Coherent and Normal Rate  Volume:  Normal  Mood:  Depressed  Affect:  Congruent and Depressed  Thought Process:  Coherent, Goal Directed and Intact  Orientation:  Full (Time, Place, and Person)  Thought Content:  WDL  Suicidal Thoughts:  No  Homicidal Thoughts:  No  Memory:  Immediate;   Good Recent;    Good Remote;   Good  Judgement:  Impaired  Insight:  Fair  Psychomotor Activity:  Psychomotor Retardation  Concentration:  Fair  Recall:  NA  Fund of Knowledge:Fair  Language:  Good  Akathisia:  NA  Handed:  Right  AIMS (if indicated):     Assets:  Desire for Improvement  ADL's:  Intact  Cognition: WNL  Sleep:    poor   Medical Decision Making: Review of Psycho-Social Stressors (1), Established Problem, Worsening (2), Review of Medication Regimen & Side Effects (2) and Review of New Medication or Change in Dosage (2)  Treatment Plan Summary: Daily contact with patient to assess and evaluate symptoms and progress in treatment and Medication management  Plan:  He completed detox and will be discharged in am. Disposition: Patient is for discharge in am.  Delfin Gant   PMHNP-BC 02/26/2015 1:47 PM   Patient seen and I agree with treatment and plan  Griffin Dakin.D.

## 2015-02-26 NOTE — Progress Notes (Signed)
Disposition CSW completed referrals to [facility] and [facility] for detox placement.  CSW will continue to assist and refer to other placements.   Seward Speck [facility] Disposition CSW [phone]

## 2015-02-26 NOTE — ED Notes (Signed)
Patient is resting comfortably. 

## 2015-02-26 NOTE — Progress Notes (Signed)
[facility] and [facility] have no available beds today.  [facility] declined due to medical acuity.  CSW referred patient to [facility], [facility], and refaxed to [facility].  Seward Speck [facility] Disposition CSW [phone]

## 2015-02-27 NOTE — Progress Notes (Addendum)
Contacted [facility] for delivery of RW. RW delivered to room. Isidoro Donning RN CCM Case Mgmt phone [phone]

## 2015-02-27 NOTE — ED Notes (Signed)
Offered pt a shower two times today he refused stated " i took one the other day"
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
## 2015-02-28 DIAGNOSIS — F1020 Alcohol dependence, uncomplicated: Secondary | ICD-10-CM

## 2015-02-28 DIAGNOSIS — F332 Major depressive disorder, recurrent severe without psychotic features: Secondary | ICD-10-CM

## 2015-02-28 DIAGNOSIS — F1290 Cannabis use, unspecified, uncomplicated: Secondary | ICD-10-CM

## 2015-02-28 MED ORDER — BENZONATATE 100 MG PO CAPS: 100.0000 mg | ORAL_CAPSULE | Freq: Three times a day (TID) | ORAL | Status: DC | PRN

## 2015-02-28 MED ORDER — TRAZODONE HCL 50 MG PO TABS: 50.0000 mg | ORAL_TABLET | Freq: Every evening | ORAL | Status: DC | PRN

## 2015-02-28 MED ORDER — NICOTINE 21 MG/24HR TD PT24: 21.0000 mg | MEDICATED_PATCH | Freq: Every day | TRANSDERMAL | Status: DC

## 2015-02-28 NOTE — Discharge Instructions (Signed)
For your ongoing behavioral health needs, you are advised to contact [facility].  They will help you to find an appropriate treatment provider in your community.  The phone number below also serves as a 24 hour crisis number:       [facility]      [address]      [place], [place] [address]      [phone]

## 2015-02-28 NOTE — Progress Notes (Signed)
Per psychiatrist and NP, patient psychiatrically stable for discharge home. Pt plans to dc to [facility] and plans to follow up with shelters. Pt provided resources in [place]. Patient states he is familiar with [facility] resources. Pt requested part pass and bus pass. CSW provided patient with $3 for part and bus pass. No further Clinical Social Work needs, signing off.   Olga Coaster, LCSW  Clinical Social Work  [employer] [phone]

## 2015-02-28 NOTE — Progress Notes (Addendum)
Pt confirmed wth ED Cm he plans to return to [facility] [place] TTS & ED SW updated  Pt had been given resources for [employer] by [facility] ED PM CM on 02/25/15. Pt given RW by [facility] ED weekend CM on 02/27/15 Pt informed CM he is already aware of [place] but does not have a doctor there. Pt informed Cm he will need a bus pass to get to [facility] then a ticket to get on PART bus to get back to [facility] but does not have money. ED SW made aware and can assist Pt states he is "ready to go" Pt asked about getting a pair of scrub bottoms to cover his ripped jeans area Pt given [employer] free or reduced cost resources to included [employer] clinic, community care center, [facility], [facility] [employer], [facility] [employer] [facility], [facility], [facility] [employer] [facility], health care access, [facility], [facility], [facility], [facility], [facility], community care center, Programmer, applications clinic, crisis control ministries, [facility], [facility], mental health association, [facility], [facility] and a list of helpful numbers for disability, senior services financial assistance resources   1147 Cm reviewed these with pt who confirms he does not have issues with reading.  Informed him of assist with transportation services

## 2015-02-28 NOTE — [facility] Suicide Risk Assessment (Signed)
[facility] Discharge Suicide Risk Assessment   Demographic Factors:  Male, Caucasian, Low socioeconomic status and Unemployed  Total Time spent with patient: 30 minutes  Musculoskeletal: Strength & Muscle Tone: unable to ambulate due to rt hip pain Gait & Station: unable to ambulate due to right hip pain Patient leans: unable to ambulate  Psychiatric Specialty Exam:     Blood pressure 125/78, pulse 96, temperature 97.7 F (36.5 C), temperature source Oral, resp. rate 20, SpO2 97 %.There is no weight on file to calculate BMI.     General Appearance: Casual and Disheveled  Eye Contact:: Minimal  Speech: Clear and Coherent and Normal Rate  Volume: Normal  Mood: Depressed  Affect: Congruent and Depressed  Thought Process: Coherent, Goal Directed and Intact  Orientation: Full (Time, Place, and Person)  Thought Content: WDL  Suicidal Thoughts: No  Homicidal Thoughts: No  Memory: Immediate; Good Recent; Good Remote; Good  Judgement: Impaired  Insight: Fair  Psychomotor Activity: Psychomotor Retardation  Concentration: Fair  Recall: NA  Fund of Knowledge:Fair  Language: Good  Akathisia: NA  Handed: Right  AIMS (if indicated):    Assets: Desire for Improvement  ADL's: Intact  Cognition: WNL  Sleep:   poor          Has this patient used any form of tobacco in the last 30 days? (Cigarettes, Smokeless Tobacco, Cigars, and/or Pipes) Provided script  Mental Status Per Nursing Assessment::   On Admission:     Current Mental Status by Physician: Pt denies suicidal ideation. No identifiable objective evidence of suicidal or self-harm gestures. Alert/oriented x4. Calm, cooperative, and answers questions appropriately. Stable for discharge at this time.   Loss Factors: Decline in physical health  Historical Factors: Impulsivity  Risk Reduction Factors:   Positive social support  Continued Clinical Symptoms:  Depression:    Anhedonia Comorbid alcohol abuse/dependence Hopelessness Impulsivity Alcohol/Substance Abuse/Dependencies  Cognitive Features That Contribute To Risk:  Closed-mindedness    Suicide Risk:  Minimal: No identifiable suicidal ideation.  Patients presenting with no risk factors but with morbid ruminations; may be classified as minimal risk based on the severity of the depressive symptoms  Principal Problem: Alcohol use disorder, severe, dependence Discharge Diagnoses:  Patient Active Problem List   Diagnosis Date Noted  . Major depressive disorder, recurrent episode, moderate [F33.1]   . Substance abuse [F19.10]   . Suicidal ideation [R45.851]   . Sprained ankle [S93.409A] 10/28/2014  . 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 Alcohol use disorder, severe, dependence [F10.20] 10/26/2014  . Cannabis use disorder, severe, dependence [F12.20] 10/26/2014  . Gastrointestinal bleeding, lower [K92.2] 02/14/2014      Plan Of Care/Follow-up recommendations:  Activity:  As tolerated and limited by pain and mobility concerns Diet:  Heart healthy with low sodium.  Is patient on multiple antipsychotic therapies at discharge:  No   Has Patient had three or more failed trials of antipsychotic monotherapy by history:  No  Recommended Plan for Multiple Antipsychotic Therapies: NA  Beau Fanny, FNP-BC 02/28/2015, 3:37 PM
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
## 2018-03-26 ENCOUNTER — Other Ambulatory Visit: Payer: Self-pay

## 2018-03-26 ENCOUNTER — Encounter (HOSPITAL_BASED_OUTPATIENT_CLINIC_OR_DEPARTMENT_OTHER): Payer: Self-pay | Admitting: Emergency Medicine

## 2018-03-26 ENCOUNTER — Emergency Department (HOSPITAL_BASED_OUTPATIENT_CLINIC_OR_DEPARTMENT_OTHER)
Admission: EM | Admit: 2018-03-26 | Discharge: 2018-03-26 | Disposition: A | Discharge status: Home / Self Care | Payer: Self-pay | Attending: Emergency Medicine | Admitting: Emergency Medicine

## 2018-03-26 DIAGNOSIS — Z590 Homelessness: Secondary | ICD-10-CM | POA: Insufficient documentation

## 2018-03-26 DIAGNOSIS — F17210 Nicotine dependence, cigarettes, uncomplicated: Secondary | ICD-10-CM | POA: Insufficient documentation

## 2018-03-26 DIAGNOSIS — F10920 Alcohol use, unspecified with intoxication, uncomplicated: Secondary | ICD-10-CM | POA: Insufficient documentation

## 2018-03-26 DIAGNOSIS — J449 Chronic obstructive pulmonary disease, unspecified: Secondary | ICD-10-CM | POA: Insufficient documentation

## 2018-03-26 HISTORY — DX: Other psychoactive substance abuse, uncomplicated: F19.10

## 2018-03-26 HISTORY — DX: Chronic obstructive pulmonary disease, unspecified: J44.9

## 2018-03-26 HISTORY — DX: Homelessness unspecified: Z59.00

## 2018-03-26 HISTORY — DX: Homelessness: Z59.0

## 2018-03-26 HISTORY — DX: Malingerer (conscious simulation): Z76.5

## 2018-03-26 NOTE — ED Provider Notes (Signed)
[facility] EMERGENCY DEPARTMENT Provider Note  CSN: [id number] Arrival date & time: 03/26/18 [id number]  Chief Complaint(s) Addiction Problem  HPI [name] [name] is a 57 y.o. male with past medical history listed below including daily alcohol use, polysubstance abuse, homelessness, frequent ED visits for secondary gain who presents to the emergency department after being discharged from [facility] less than 4 hours ago requesting detox.  He endorsed drinking alcohol and taking Xanax prior. Patient reports that [place].  He requested to be taken to the emergency department for detox.  Patient was evaluated and medically cleared.  Psychiatry evaluated the patient and cleared for outpatient management.  Patient stated that he has whether he could stay in the emergency department until the morning but they did not let him stay.  After leaving [facility], the patient called police so that they can transport him here.  He denies any acute changes or complaints since his discharge from [facility].    HPI  Past Medical History Past Medical History:  Diagnosis Date  . Alcoholic (HCC)   . Anxiety   . COPD (chronic obstructive pulmonary disease) (HCC)   . Depression   . Homelessness   . Malingering   . Peripheral neuropathy   . Polysubstance abuse [facility](HCC)    Patient Active Problem List   Diagnosis Date Noted  . Major depressive disorder, recurrent episode, moderate (HCC)   . Substance abuse (HCC)   . Suicidal ideation   . Sprained ankle 10/28/2014  . Substance or medication-induced bipolar and related disorder with onset during intoxication (HCC) 10/26/2014  . Alcohol use disorder, severe, dependence (HCC) 10/26/2014  . Cannabis use disorder, severe, dependence (HCC) 10/26/2014  . Gastrointestinal bleeding, lower 02/14/2014   Home Medication(s) Prior to Admission medications   Medication Sig Start Date End Date  Taking? Authorizing Provider  benzonatate (TESSALON) 100 MG capsule Take 1 capsule (100 mg total) by mouth every 8 (eight) hours as needed for cough. 02/28/15   Withrow, Everardo AllJohn C, FNP  nicotine (NICODERM CQ - DOSED IN MG/24 HOURS) 21 mg/24hr patch Place 1 patch (21 mg total) onto the skin daily. 02/28/15   Withrow, Everardo AllJohn C, FNP  traZODone (DESYREL) 50 MG tablet Take 1 tablet (50 mg total) by mouth at bedtime and may repeat dose one time if needed. 02/28/15   Withrow, Everardo AllJohn C, FNP                                                                                                                                    Past Surgical History Past Surgical History:  Procedure Laterality Date  . HIP SURGERY     Family History No family history on file.  Social History Social History   Tobacco Use  . Smoking status: Current Every Day Smoker    Packs/day: 0.50    Years:  15.00    Pack years: 7.50    Types: Cigarettes  Substance Use Topics  . Alcohol use: Yes    Comment: 1/5 daily  . Drug use: Yes    Types: Cocaine, Marijuana    Comment: heroin   Allergies Nsaids; Fentanyl; Ketorolac; and Pantoprazole sodium  Review of Systems Review of Systems All other systems are reviewed and are negative for acute change except as noted in the HPI  Physical Exam Vital Signs  I have reviewed the triage vital signs BP (!) 166/101 (BP Location: Left Arm)   Pulse (!) 113   Temp 98.2 F (36.8 C) (Oral)   Resp 18   Ht 6\' 1"  (1.854 m)   Wt 72.6 kg (160 lb)   SpO2 100%   BMI 21.11 kg/m   Physical Exam  Constitutional: He is oriented to person, place, and time. He appears well-developed and well-nourished. No distress.  HENT:  Head: Normocephalic and atraumatic.  Right Ear: External ear normal.  Left Ear: External ear normal.  Nose: Nose normal.  Mouth/Throat: Mucous membranes are normal. No trismus in the jaw.  Eyes: Conjunctivae and EOM are normal. No scleral icterus.  Neck: Normal range of motion and  phonation normal.  Cardiovascular: Normal rate and regular rhythm.  Pulmonary/Chest: Effort normal. No stridor. No respiratory distress.  Abdominal: He exhibits no distension.  Musculoskeletal: Normal range of motion. He exhibits no edema.       Arms: Neurological: He is alert and oriented to person, place, and time.  Skin: He is not diaphoretic.  Psychiatric: He has a normal mood and affect. His behavior is normal.  Vitals reviewed.   ED Results and Treatments Labs (all labs ordered are listed, but only abnormal results are displayed) Labs Reviewed - No data to display                                                                                                                       EKG  EKG Interpretation  Date/Time:    Ventricular Rate:    PR Interval:    QRS Duration:   QT Interval:    QTC Calculation:   R Axis:     Text Interpretation:        Radiology No results found. Pertinent labs & imaging results that were available during my care of the patient were reviewed by me and considered in my medical decision making (see chart for details).  Medications Ordered in ED Medications - No data to display  Procedures Procedures  (including critical care time)  Medical Decision Making / ED Course I have reviewed the nursing notes for this encounter and the patient's prior records (if available in EHR or on provided paperwork).    Patient is requesting detox.  No labs required at this time as we are able to assess them from his visit at [facility] through care everywhere.  He is clinically sober.  He will be provided with resources for outpatient counseling and detox centers.  Patient will also be provided with resources for local shelters.  Patient requesting to stay in the emergency department until the morning.  Informed that we were  unable to do so.  However he was provided with food and drink.  The patient appears reasonably screened and/or stabilized for discharge and I doubt any other medical condition or other [facility] requiring further screening, evaluation, or treatment in the ED at this time prior to discharge.  The patient is safe for discharge with strict return precautions.   Final Clinical Impression(s) / ED Diagnoses Final diagnoses:  Alcoholic intoxication without complication (HCC)    Disposition: Discharge  Condition: Good  I have discussed the results, Dx and Tx plan with the patient who expressed understanding and agree(s) with the plan. Discharge instructions discussed at great length. The patient was given strict return precautions who verbalized understanding of the instructions. No further questions at time of discharge.    ED Discharge Orders    None       This chart was dictated using voice recognition software.  Despite best efforts to proofread,  errors can occur which can change the documentation meaning.   [name], MD 03/26/18 [phone]

## 2018-03-26 NOTE — ED Notes (Signed)
Pt awakened for discharge-  

## 2018-03-26 NOTE — ED Triage Notes (Signed)
Pt states he wants to get into detox for alcohol and drug abuse. Pt was seen yesterday at [facility] for same.

## 2018-03-26 NOTE — ED Notes (Signed)
Pt given microwave meal and drink.

## 2018-05-01 ENCOUNTER — Emergency Department (HOSPITAL_BASED_OUTPATIENT_CLINIC_OR_DEPARTMENT_OTHER)
Admission: EM | Admit: 2018-05-01 | Discharge: 2018-05-01 | Disposition: A | Discharge status: Home / Self Care | Payer: Self-pay | Attending: Emergency Medicine | Admitting: Emergency Medicine

## 2018-05-01 ENCOUNTER — Other Ambulatory Visit: Payer: Self-pay

## 2018-05-01 ENCOUNTER — Encounter (HOSPITAL_BASED_OUTPATIENT_CLINIC_OR_DEPARTMENT_OTHER): Payer: Self-pay | Admitting: Emergency Medicine

## 2018-05-01 ENCOUNTER — Emergency Department (HOSPITAL_BASED_OUTPATIENT_CLINIC_OR_DEPARTMENT_OTHER): Payer: Self-pay

## 2018-05-01 DIAGNOSIS — Z590 Homelessness: Secondary | ICD-10-CM | POA: Insufficient documentation

## 2018-05-01 DIAGNOSIS — R1013 Epigastric pain: Secondary | ICD-10-CM | POA: Insufficient documentation

## 2018-05-01 DIAGNOSIS — R748 Abnormal levels of other serum enzymes: Secondary | ICD-10-CM | POA: Insufficient documentation

## 2018-05-01 DIAGNOSIS — Z79899 Other long term (current) drug therapy: Secondary | ICD-10-CM | POA: Insufficient documentation

## 2018-05-01 DIAGNOSIS — R062 Wheezing: Secondary | ICD-10-CM | POA: Insufficient documentation

## 2018-05-01 DIAGNOSIS — F17210 Nicotine dependence, cigarettes, uncomplicated: Secondary | ICD-10-CM | POA: Insufficient documentation

## 2018-05-01 DIAGNOSIS — J449 Chronic obstructive pulmonary disease, unspecified: Secondary | ICD-10-CM | POA: Insufficient documentation

## 2018-05-01 LAB — I-STAT CG4 LACTIC ACID, ED: Lactic Acid, Venous: 2.83 mmol/L (ref 0.5–1.9)

## 2018-05-01 LAB — COMPREHENSIVE METABOLIC PANEL
ALBUMIN: 3.4 g/dL — AB (ref 3.5–5.0)
ALK PHOS: 123 U/L (ref 38–126)
ALT: 585 U/L — AB (ref 0–44)
AST: 826 U/L — AB (ref 15–41)
Anion gap: 10 (ref 5–15)
BILIRUBIN TOTAL: 0.9 mg/dL (ref 0.3–1.2)
BUN: 14 mg/dL (ref 6–20)
CALCIUM: 8.2 mg/dL — AB (ref 8.9–10.3)
CO2: 25 mmol/L (ref 22–32)
CREATININE: 0.72 mg/dL (ref 0.61–1.24)
Chloride: 104 mmol/L (ref 98–111)
GFR calc Af Amer: >60 mL/min (ref >60)
Glucose, Bld: 90 mg/dL (ref 70–99)
Potassium: 3.7 mmol/L (ref 3.5–5.1)
Sodium: 139 mmol/L (ref 135–145)
TOTAL PROTEIN: 6.7 g/dL (ref 6.5–8.1)

## 2018-05-01 LAB — PROTIME-INR
INR: 1.06
PROTHROMBIN TIME: 13.7 s (ref 11.4–15.2)

## 2018-05-01 LAB — CBC WITH DIFFERENTIAL/PLATELET
BASOS ABS: 0 10*3/uL (ref 0.0–0.1)
BASOS PCT: 0 %
Eosinophils Absolute: 0.2 10*3/uL (ref 0.0–0.7)
Eosinophils Relative: 4 %
HEMATOCRIT: 34.7 % — AB (ref 39.0–52.0)
HEMOGLOBIN: 11.9 g/dL — AB (ref 13.0–17.0)
LYMPHS PCT: 32 %
Lymphs Abs: 1.6 10*3/uL (ref 0.7–4.0)
MCH: 33.3 pg (ref 26.0–34.0)
MCHC: 34.3 g/dL (ref 30.0–36.0)
MCV: 97.2 fL (ref 78.0–100.0)
MONO ABS: 0.5 10*3/uL (ref 0.1–1.0)
Monocytes Relative: 10 %
NEUTROS ABS: 2.6 10*3/uL (ref 1.7–7.7)
NEUTROS PCT: 54 %
Platelets: 233 10*3/uL (ref 150–400)
RBC: 3.57 MIL/uL — AB (ref 4.22–5.81)
RDW: 15.5 % (ref 11.5–15.5)
WBC: 4.8 10*3/uL (ref 4.0–10.5)

## 2018-05-01 LAB — TROPONIN I: Troponin I: <0.03 ng/mL (ref <0.03)

## 2018-05-01 LAB — ACETAMINOPHEN LEVEL: Acetaminophen (Tylenol), Serum: <10 ug/mL — ABNORMAL LOW (ref 10–30)

## 2018-05-01 LAB — AMMONIA: AMMONIA: 42 umol/L — AB (ref 9–35)

## 2018-05-01 LAB — ETHANOL: ALCOHOL ETHYL (B): 180 mg/dL — AB (ref <10)

## 2018-05-01 LAB — LIPASE, BLOOD: LIPASE: 55 U/L — AB (ref 11–51)

## 2018-05-01 LAB — BRAIN NATRIURETIC PEPTIDE: B Natriuretic Peptide: 22.6 pg/mL (ref 0.0–100.0)

## 2018-05-01 MED ORDER — IPRATROPIUM-ALBUTEROL 0.5-2.5 (3) MG/3ML IN SOLN
3.0000 mL | Freq: Four times a day (QID) | RESPIRATORY_TRACT | Status: DC
  Administered 2018-05-01: 3 mL via RESPIRATORY_TRACT
  Filled 2018-05-01: qty 3

## 2018-05-01 MED ORDER — SODIUM CHLORIDE 0.9 % IV BOLUS
1000.0000 mL | Freq: Once | INTRAVENOUS | Status: AC
  Administered 2018-05-01: 1000 mL via INTRAVENOUS

## 2018-05-01 MED ORDER — ALBUTEROL SULFATE HFA 108 (90 BASE) MCG/ACT IN AERS
2.0000 | INHALATION_SPRAY | Freq: Once | RESPIRATORY_TRACT | Status: AC
  Administered 2018-05-01: 2 via RESPIRATORY_TRACT
  Filled 2018-05-01: qty 6.7

## 2018-05-01 MED ORDER — FENTANYL CITRATE (PF) 100 MCG/2ML IJ SOLN
50.0000 ug | Freq: Once | INTRAMUSCULAR | Status: AC
  Administered 2018-05-01: 50 ug via INTRAVENOUS
  Filled 2018-05-01: qty 2

## 2018-05-01 NOTE — ED Notes (Signed)
Critical Lactic Acid 2.83 reported to RN and MD Tegeler.

## 2018-05-01 NOTE — ED Triage Notes (Signed)
Pt reports continued SHOB since Sunday; [facility] because [facility]'t [facility]; also reports "liver problems"

## 2018-05-01 NOTE — ED Notes (Signed)
Patient transported to X-ray 

## 2018-05-01 NOTE — Discharge Instructions (Signed)
Your work-up today showed evidence of worsening liver function in the setting of your continued alcohol use.  Please consider cutting down or discontinuing.  Please utilize the resources provided to try to detox and quit drinking as this will significantly help your liver and pain.  You were somewhat dehydrated and the fluids improve this.  Please follow-up with a primary care physician, a gastroenterologist, and the outpatient substance abuse programs.  If any symptoms change or worsen, please return to the nearest emergency department.

## 2018-05-01 NOTE — ED Provider Notes (Signed)
[facility] EMERGENCY DEPARTMENT Provider Note   CSN: [id number] Arrival date & time: 05/01/18  1811     History   Chief Complaint No chief complaint on file.   HPI [name] [name] is a 57 y.o. male.  The history is provided by the patient and medical records. No language interpreter was used.  Shortness of Breath  This is a chronic problem. The problem occurs continuously.The current episode started more than 2 days ago. The problem has not changed since onset.Associated symptoms include wheezing, chest pain (tightness) and abdominal pain. Pertinent negatives include no fever, no headaches, no rhinorrhea, no neck pain, no cough, no sputum production, no syncope, no vomiting, no rash, no leg pain and no leg swelling. The problem's precipitants include weather/humidity. He has tried nothing for the symptoms. The treatment provided no relief. He has had prior hospitalizations. He has had prior ED visits. Associated medical issues include COPD.    Past Medical History:  Diagnosis Date  . Alcoholic ([place])   . Anxiety   . COPD (chronic obstructive pulmonary disease) ([place])   . Depression   . Homelessness   . Malingering   . Peripheral neuropathy   . Polysubstance abuse [facility])     Patient Active Problem List   Diagnosis Date Noted  . Major depressive disorder, recurrent episode, moderate ([place])   . Substance abuse ([place])   . Suicidal ideation   . Sprained ankle 10/28/2014  . Substance or medication-induced bipolar and related disorder with onset during intoxication ([place]) 10/26/2014  . Alcohol use disorder, severe, dependence ([place]) 10/26/2014  . Cannabis use disorder, severe, dependence ([place]) 10/26/2014  . Gastrointestinal bleeding, lower 02/14/2014    Past Surgical History:  Procedure Laterality Date  . HIP SURGERY          Home Medications    Prior to Admission medications   Medication Sig Start Date End Date Taking? Authorizing Provider  benzonatate (TESSALON)  100 MG capsule Take 1 capsule (100 mg total) by mouth every 8 (eight) hours as needed for cough. 02/28/15   Withrow, Elyse Jarvis, FNP  nicotine (NICODERM CQ - DOSED IN MG/24 HOURS) 21 mg/24hr patch Place 1 patch (21 mg total) onto the skin daily. 02/28/15   Withrow, Elyse Jarvis, FNP  traZODone (DESYREL) 50 MG tablet Take 1 tablet (50 mg total) by mouth at bedtime and may repeat dose one time if needed. 02/28/15   Withrow, Elyse Jarvis, FNP    Family History No family history on file.  Social History Social History   Tobacco Use  . Smoking status: Current Every Day Smoker    Packs/day: 0.50    Years: 15.00    Pack years: 7.50    Types: Cigarettes  Substance Use Topics  . Alcohol use: Yes    Comment: 1/5 daily  . Drug use: Yes    Types: Cocaine, Marijuana    Comment: heroin     Allergies   Nsaids; Fentanyl; Ketorolac; and Pantoprazole sodium   Review of Systems Review of Systems  Constitutional: Positive for fatigue. Negative for chills, diaphoresis and fever.  HENT: Negative for congestion and rhinorrhea.   Eyes: Negative for visual disturbance.  Respiratory: Positive for chest tightness, shortness of breath and wheezing. Negative for cough, sputum production and stridor.   Cardiovascular: Positive for chest pain (tightness). Negative for palpitations, leg swelling and syncope.  Gastrointestinal: Positive for abdominal pain and nausea. Negative for constipation, diarrhea and vomiting.  Genitourinary: Negative for dysuria, flank pain and frequency.  Musculoskeletal: Negative for back pain, neck pain and neck stiffness.  Skin: Negative for rash and wound.  Neurological: Negative for weakness, light-headedness, numbness and headaches.  Psychiatric/Behavioral: Negative for agitation and confusion.  All other systems reviewed and are negative.    Physical Exam Updated Vital Signs BP 126/84   Pulse 99   Temp 98 F (36.7 C) (Oral)   Resp 18   SpO2 95%   Physical Exam  Constitutional: He  is oriented to person, place, and time. He appears well-developed and well-nourished. No distress.  HENT:  Head: Normocephalic and atraumatic.  Nose: Nose normal.  Mouth/Throat: Oropharynx is clear and moist. No oropharyngeal exudate.  Eyes: Pupils are equal, round, and reactive to light. Conjunctivae and EOM are normal.  Neck: Normal range of motion. Neck supple.  Cardiovascular: Normal rate and regular rhythm.  No murmur heard. Pulmonary/Chest: Effort normal. No respiratory distress. He has wheezes. He has no rales. He exhibits no tenderness.  Abdominal: Soft. There is tenderness. There is no guarding.  Musculoskeletal: He exhibits no edema or tenderness.  Lymphadenopathy:    He has no cervical adenopathy.  Neurological: He is alert and oriented to person, place, and time. No sensory deficit. He exhibits normal muscle tone.  Skin: Skin is warm and dry. Capillary refill takes less than 2 seconds. He is not diaphoretic. No erythema. No pallor.  Psychiatric: He has a normal mood and affect.  Nursing note and vitals reviewed.    ED Treatments / Results  Labs (all labs ordered are listed, but only abnormal results are displayed) Labs Reviewed  CBC WITH DIFFERENTIAL/PLATELET - Abnormal; Notable for the following components:      Result Value   RBC 3.57 (*)    Hemoglobin 11.9 (*)    HCT 34.7 (*)    All other components within normal limits  COMPREHENSIVE METABOLIC PANEL - Abnormal; Notable for the following components:   Calcium 8.2 (*)    Albumin 3.4 (*)    AST 826 (*)    ALT 585 (*)    All other components within normal limits  LIPASE, BLOOD - Abnormal; Notable for the following components:   Lipase 55 (*)    All other components within normal limits  ETHANOL - Abnormal; Notable for the following components:   Alcohol, Ethyl (B) 180 (*)    All other components within normal limits  ACETAMINOPHEN LEVEL - Abnormal; Notable for the following components:   Acetaminophen  (Tylenol), Serum <10 (*)    All other components within normal limits  AMMONIA - Abnormal; Notable for the following components:   Ammonia 42 (*)    All other components within normal limits  I-STAT CG4 LACTIC ACID, ED - Abnormal; Notable for the following components:   Lactic Acid, Venous 2.83 (*)    All other components within normal limits  TROPONIN I  BRAIN NATRIURETIC PEPTIDE  PROTIME-INR    EKG EKG Interpretation  Date/Time:  Thursday May 01 2018 18:40:46 EDT Ventricular Rate:  97 PR Interval:    QRS Duration: 82 QT Interval:  354 QTC Calculation: 450 R Axis:   79 Text Interpretation:  Sinus rhythm Baseline wander in lead(s) V3 When compared to prior, no significiant changes seen.  NO STEMI Confirmed by Antony Blackbird [id number]) on 05/01/2018 7:03:17 PM   Radiology Dg Chest 2 View  Result Date: 05/01/2018 CLINICAL DATA:  Continued shortness of breath for several days EXAM: CHEST - 2 VIEW COMPARISON:  06/22/2008 FINDINGS: Cardiac shadows within normal limits.  The lungs are hyper aerated bilaterally. No focal infiltrate or sizable effusion is seen. Some mild deformity of the sixth, seventh and eighth ribs are noted on the right posterolaterally new from the prior exam. These are of uncertain chronicity. No pneumothorax is noted. IMPRESSION: Deformities of the right ribcage as described of uncertain chronicity. Correlation with any recent injury is recommended. No complicating factors are seen. COPD. Electronically Signed   By: Inez Catalina M.D.   On: 05/01/2018 19:50    Procedures Procedures (including critical care time)  Medications Ordered in ED Medications  ipratropium-albuterol (DUONEB) 0.5-2.5 (3) MG/3ML nebulizer solution 3 mL (3 mLs Nebulization Given 05/01/18 1921)  sodium chloride 0.9 % bolus 1,000 mL (0 mLs Intravenous Stopped 05/01/18 2011)  sodium chloride 0.9 % bolus 1,000 mL ( Intravenous Stopped 05/01/18 2113)  albuterol (PROVENTIL HFA;VENTOLIN HFA) 108 (90  Base) MCG/ACT inhaler 2 puff (2 puffs Inhalation Given 05/01/18 2046)  fentaNYL (SUBLIMAZE) injection 50 mcg (50 mcg Intravenous Given 05/01/18 2051)     Initial Impression / Assessment and Plan / ED Course  I have reviewed the triage vital signs and the nursing notes.  Pertinent labs & imaging results that were available during my care of the patient were reviewed by me and considered in my medical decision making (see chart for details).     [name] [name] is a 57 y.o. 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  Patient says that he was in the hospital several days ago and left AGAINST MEDICAL ADVICE.  He says that he was in the hospital for alcohol and liver pain.  He also went to detox at that time.  He says that since he left 4 days ago he has had worsening shortness of breath when he ambulates.  He reports occasional chest tightness.  He reports some wheezing.  He denies fevers or chills or productive cough.  He reports that his abdomen has been hurting all across the upper abdomen.  He says that he has continued to drink beer.  He denies any emesis but does report nausea.  He denies any conservation, diarrhea, or urinary symptoms.  He denies any other traumas.  On exam, abdomen is diffusely tender in the upper abdomen.  Patient's lungs have mild wheezing.  Chest was nontender.  Patient has a surgical scar in his abdomen.  No CVA tenderness.  Patient is alert and oriented.  No lower extremity tenderness or edema was seen.  Clinically I suspect the patient is having more liver pain after continue to drink alcohol.  Patient will be given fluids and assessed for intoxication.  Patient will have laboratory testing to look for liver failure or worsening kidney function.  He  also be assessed for alcohol withdrawal.  Anticipate reassessment after work-up.  Patient had some improvement in his symptoms after fluids.  Patient reports his abdominal pain extremities severe.  Patient's labs show that he is having worsening liver function from prior with AST and ALT elevations.  Alk phos not elevated.  Lipase slightly elevated.  Alcohol was elevated, suspect his intoxication is contributing to his severe abdominal pain in the setting of his liver disease.  Chest x-ray showed no acute abnormality compared to prior.  COPD was seen.  Patient was given 1 dose of fentanyl as he  reports he has had this in the past and it helped him before his leaving AMA from previous hospital.  Patient advised that vaginal can be metabolized in the liver and this is likely not the best medication for him.  Patient advised he will not be given any medication to go home with.  Other laboratory testing showed no leukocytosis and similar anemia.  INR improved from prior.  Mild ammonia elevation.  Lactic acid elevated however suspect is due to dehydration which she admits to.  After fentanyl and fluids, patient was feeling much better.  Patient advised needs to follow-up with his PCP, his gastroenterologist, and outpatient substance abuse programs.  Patient given resources on this.    Given patient's improvement and labs, low suspicion for SBP.  Abdomen is not distended, I do not feel he needs paracentesis.  Patient does not want to wait for urinalysis.  Patient agreed with discharge.  Patient understood return precautions and was discharged in good condition.  Final Clinical Impressions(s) / ED Diagnoses   Final diagnoses:  Epigastric pain  Elevated liver enzymes  Wheezing    ED Discharge Orders    None     Clinical Impression: 1. Epigastric pain   2. Elevated liver enzymes   3. Wheezing     Disposition: Discharge  Condition: Good  I have discussed the results, Dx and Tx plan with the  pt(& family if present). He/she/they expressed understanding and agree(s) with the plan. Discharge instructions discussed at great length. Strict return precautions discussed and pt &/or family have verbalized understanding of the instructions. No further questions at time of discharge.    Discharge Medication List as of 05/01/2018  9:40 PM      Follow Up: [facility] [address] [place] [address] [phone] Schedule an appointment as soon as possible for a visit    [facility] [address] [id number] [place] [phone]       Tegeler, Gwenyth Allegra, MD 05/02/18 [phone]

## 2018-05-03 ENCOUNTER — Encounter (HOSPITAL_COMMUNITY): Payer: Self-pay | Admitting: Emergency Medicine

## 2018-05-03 ENCOUNTER — Other Ambulatory Visit: Payer: Self-pay

## 2018-05-03 ENCOUNTER — Emergency Department (HOSPITAL_COMMUNITY)
Admission: EM | Admit: 2018-05-03 | Discharge: 2018-05-03 | Disposition: A | Discharge status: Home / Self Care | Payer: Self-pay | Attending: Emergency Medicine | Admitting: Emergency Medicine

## 2018-05-03 ENCOUNTER — Emergency Department (HOSPITAL_COMMUNITY)
Admission: EM | Admit: 2018-05-03 | Discharge: 2018-05-04 | Disposition: A | Discharge status: Home / Self Care | Payer: Self-pay | Attending: Emergency Medicine | Admitting: Emergency Medicine

## 2018-05-03 DIAGNOSIS — J449 Chronic obstructive pulmonary disease, unspecified: Secondary | ICD-10-CM | POA: Insufficient documentation

## 2018-05-03 DIAGNOSIS — K920 Hematemesis: Secondary | ICD-10-CM | POA: Insufficient documentation

## 2018-05-03 DIAGNOSIS — F17210 Nicotine dependence, cigarettes, uncomplicated: Secondary | ICD-10-CM | POA: Insufficient documentation

## 2018-05-03 DIAGNOSIS — G8929 Other chronic pain: Secondary | ICD-10-CM

## 2018-05-03 DIAGNOSIS — F1219 Cannabis abuse with unspecified cannabis-induced disorder: Secondary | ICD-10-CM | POA: Diagnosis present

## 2018-05-03 DIAGNOSIS — R110 Nausea: Secondary | ICD-10-CM | POA: Insufficient documentation

## 2018-05-03 DIAGNOSIS — Z79899 Other long term (current) drug therapy: Secondary | ICD-10-CM | POA: Insufficient documentation

## 2018-05-03 DIAGNOSIS — R1011 Right upper quadrant pain: Secondary | ICD-10-CM | POA: Insufficient documentation

## 2018-05-03 DIAGNOSIS — F19929 Other psychoactive substance use, unspecified with intoxication, unspecified: Secondary | ICD-10-CM

## 2018-05-03 DIAGNOSIS — F332 Major depressive disorder, recurrent severe without psychotic features: Secondary | ICD-10-CM | POA: Insufficient documentation

## 2018-05-03 DIAGNOSIS — R531 Weakness: Secondary | ICD-10-CM | POA: Insufficient documentation

## 2018-05-03 DIAGNOSIS — F1994 Other psychoactive substance use, unspecified with psychoactive substance-induced mood disorder: Secondary | ICD-10-CM | POA: Insufficient documentation

## 2018-05-03 DIAGNOSIS — R45851 Suicidal ideations: Secondary | ICD-10-CM | POA: Insufficient documentation

## 2018-05-03 HISTORY — DX: Unspecified viral hepatitis C without hepatic coma: B19.20

## 2018-05-03 LAB — COMPREHENSIVE METABOLIC PANEL
ALBUMIN: 3.2 g/dL — AB (ref 3.5–5.0)
ALK PHOS: 125 U/L (ref 38–126)
ALK PHOS: 119 U/L (ref 38–126)
ALT: 434 U/L — AB (ref 0–44)
ALT: 400 U/L — ABNORMAL HIGH (ref 0–44)
ANION GAP: 8 (ref 5–15)
ANION GAP: 9 (ref 5–15)
AST: 506 U/L — ABNORMAL HIGH (ref 15–41)
AST: 498 U/L — AB (ref 15–41)
Albumin: 3.3 g/dL — ABNORMAL LOW (ref 3.5–5.0)
BUN: 8 mg/dL (ref 6–20)
BUN: 7 mg/dL (ref 6–20)
CALCIUM: 8.5 mg/dL — AB (ref 8.9–10.3)
CO2: 24 mmol/L (ref 22–32)
CO2: 23 mmol/L (ref 22–32)
CREATININE: 0.61 mg/dL (ref 0.61–1.24)
Calcium: 8.5 mg/dL — ABNORMAL LOW (ref 8.9–10.3)
Chloride: 104 mmol/L (ref 98–111)
Chloride: 104 mmol/L (ref 98–111)
Creatinine, Ser: 0.52 mg/dL — ABNORMAL LOW (ref 0.61–1.24)
GFR calc Af Amer: >60 mL/min (ref >60)
GFR calc non Af Amer: >60 mL/min (ref >60)
GLUCOSE: 87 mg/dL (ref 70–99)
Glucose, Bld: 105 mg/dL — ABNORMAL HIGH (ref 70–99)
POTASSIUM: 4.1 mmol/L (ref 3.5–5.1)
Potassium: 4 mmol/L (ref 3.5–5.1)
SODIUM: 136 mmol/L (ref 135–145)
SODIUM: 136 mmol/L (ref 135–145)
TOTAL PROTEIN: 6.8 g/dL (ref 6.5–8.1)
Total Bilirubin: 1.4 mg/dL — ABNORMAL HIGH (ref 0.3–1.2)
Total Bilirubin: 1.7 mg/dL — ABNORMAL HIGH (ref 0.3–1.2)
Total Protein: 6.6 g/dL (ref 6.5–8.1)

## 2018-05-03 LAB — URINALYSIS, ROUTINE W REFLEX MICROSCOPIC
BILIRUBIN URINE: NEGATIVE
GLUCOSE, UA: NEGATIVE mg/dL
HGB URINE DIPSTICK: NEGATIVE
Ketones, ur: NEGATIVE mg/dL
Leukocytes, UA: NEGATIVE
Nitrite: NEGATIVE
PROTEIN: NEGATIVE mg/dL
Specific Gravity, Urine: 1.017 (ref 1.005–1.030)
pH: 6 (ref 5.0–8.0)

## 2018-05-03 LAB — CBC WITH DIFFERENTIAL/PLATELET
BASOS ABS: 0 10*3/uL (ref 0.0–0.1)
Basophils Absolute: 0 10*3/uL (ref 0.0–0.1)
Basophils Relative: 0 %
Basophils Relative: 0 %
EOS ABS: 0.1 10*3/uL (ref 0.0–0.7)
EOS PCT: 2 %
Eosinophils Absolute: 0.2 10*3/uL (ref 0.0–0.7)
Eosinophils Relative: 5 %
HCT: 37.4 % — ABNORMAL LOW (ref 39.0–52.0)
HEMATOCRIT: 38.2 % — AB (ref 39.0–52.0)
HEMOGLOBIN: 13 g/dL (ref 13.0–17.0)
Hemoglobin: 12.9 g/dL — ABNORMAL LOW (ref 13.0–17.0)
LYMPHS ABS: 1 10*3/uL (ref 0.7–4.0)
Lymphocytes Relative: 19 %
Lymphocytes Relative: 27 %
Lymphs Abs: 0.8 10*3/uL (ref 0.7–4.0)
MCH: 33.9 pg (ref 26.0–34.0)
MCH: 33.6 pg (ref 26.0–34.0)
MCHC: 34.5 g/dL (ref 30.0–36.0)
MCHC: 34 g/dL (ref 30.0–36.0)
MCV: 98.2 fL (ref 78.0–100.0)
MCV: 98.7 fL (ref 78.0–100.0)
MONO ABS: 0.5 10*3/uL (ref 0.1–1.0)
MONOS PCT: 13 %
Monocytes Absolute: 0.5 10*3/uL (ref 0.1–1.0)
Monocytes Relative: 12 %
NEUTROS ABS: 2 10*3/uL (ref 1.7–7.7)
NEUTROS PCT: 55 %
Neutro Abs: 2.7 10*3/uL (ref 1.7–7.7)
Neutrophils Relative %: 67 %
Platelets: 220 10*3/uL (ref 150–400)
Platelets: 200 10*3/uL (ref 150–400)
RBC: 3.81 MIL/uL — AB (ref 4.22–5.81)
RBC: 3.87 MIL/uL — ABNORMAL LOW (ref 4.22–5.81)
RDW: 15.3 % (ref 11.5–15.5)
RDW: 15.3 % (ref 11.5–15.5)
WBC: 4 10*3/uL (ref 4.0–10.5)
WBC: 3.7 10*3/uL — ABNORMAL LOW (ref 4.0–10.5)

## 2018-05-03 LAB — RAPID URINE DRUG SCREEN, HOSP PERFORMED
AMPHETAMINES: NOT DETECTED
Barbiturates: NOT DETECTED
Benzodiazepines: POSITIVE — AB
Cocaine: NOT DETECTED
TETRAHYDROCANNABINOL: POSITIVE — AB

## 2018-05-03 LAB — PROTIME-INR
INR: 0.99
Prothrombin Time: 13 seconds (ref 11.4–15.2)

## 2018-05-03 LAB — TROPONIN I: Troponin I: <0.03 ng/mL (ref <0.03)

## 2018-05-03 LAB — ETHANOL: Alcohol, Ethyl (B): <10 mg/dL (ref <10)

## 2018-05-03 LAB — LIPASE, BLOOD: LIPASE: 53 U/L — AB (ref 11–51)

## 2018-05-03 MED ORDER — LORAZEPAM 1 MG PO TABS: 0.0000 mg | ORAL_TABLET | Freq: Two times a day (BID) | ORAL | Status: DC

## 2018-05-03 MED ORDER — LORAZEPAM 1 MG PO TABS: 0.0000 mg | ORAL_TABLET | Freq: Four times a day (QID) | ORAL | Status: DC

## 2018-05-03 MED ORDER — LORAZEPAM 2 MG/ML IJ SOLN
1.0000 mg | Freq: Once | INTRAMUSCULAR | Status: AC
  Administered 2018-05-03: 1 mg via INTRAVENOUS
  Filled 2018-05-03: qty 1

## 2018-05-03 MED ORDER — THIAMINE HCL 100 MG/ML IJ SOLN
100.0000 mg | Freq: Once | INTRAMUSCULAR | Status: AC
  Administered 2018-05-03: 100 mg via INTRAVENOUS
  Filled 2018-05-03: qty 2

## 2018-05-03 MED ORDER — SODIUM CHLORIDE 0.9 % IV BOLUS
1000.0000 mL | Freq: Once | INTRAVENOUS | Status: AC
  Administered 2018-05-03: 1000 mL via INTRAVENOUS

## 2018-05-03 MED ORDER — LORAZEPAM 2 MG/ML IJ SOLN: 0.0000 mg | Freq: Two times a day (BID) | INTRAMUSCULAR | Status: DC

## 2018-05-03 MED ORDER — FENTANYL CITRATE (PF) 100 MCG/2ML IJ SOLN
50.0000 ug | Freq: Once | INTRAMUSCULAR | Status: AC
  Administered 2018-05-03: 50 ug via INTRAVENOUS
  Filled 2018-05-03: qty 2

## 2018-05-03 MED ORDER — CHLORDIAZEPOXIDE HCL 25 MG PO CAPS
25.0000 mg | ORAL_CAPSULE | Freq: Once | ORAL | Status: AC
  Administered 2018-05-03: 25 mg via ORAL
  Filled 2018-05-03: qty 1

## 2018-05-03 MED ORDER — CHLORDIAZEPOXIDE HCL 25 MG PO CAPS: ORAL_CAPSULE | ORAL | 0 refills | Status: DC

## 2018-05-03 MED ORDER — NICOTINE 21 MG/24HR TD PT24: 21.0000 mg | MEDICATED_PATCH | Freq: Every day | TRANSDERMAL | Status: DC

## 2018-05-03 MED ORDER — THIAMINE HCL 100 MG/ML IJ SOLN: 100.0000 mg | Freq: Every day | INTRAMUSCULAR | Status: DC

## 2018-05-03 MED ORDER — TRAZODONE HCL 50 MG PO TABS
50.0000 mg | ORAL_TABLET | Freq: Every evening | ORAL | Status: DC | PRN
  Administered 2018-05-03: 50 mg via ORAL
  Filled 2018-05-03: qty 1

## 2018-05-03 MED ORDER — VITAMIN B-1 100 MG PO TABS
100.0000 mg | ORAL_TABLET | Freq: Every day | ORAL | Status: DC
  Administered 2018-05-04: 100 mg via ORAL
  Filled 2018-05-03: qty 1

## 2018-05-03 MED ORDER — LORAZEPAM 2 MG/ML IJ SOLN: 0.0000 mg | Freq: Four times a day (QID) | INTRAMUSCULAR | Status: DC

## 2018-05-03 NOTE — Discharge Instructions (Signed)
Take Librium as directed.  Follow-up with 1 of the referred substance abuse treatment centers.  Return the emergency department for any worsening abdominal pain, persistent vomiting, vomiting up blood, chest pain or difficulty breathing or any other worsening concerning symptoms.

## 2018-05-03 NOTE — ED Provider Notes (Signed)
[facility]-EMERGENCY DEPT Provider Note   CSN: [id number] Arrival date & time: 05/03/18  2034     History   Chief Complaint Chief Complaint  Patient presents with  . Suicidal    HPI [name] [name] is a 57 y.o. male.  HPI This patient presents several hours after being evaluated here, initially for abdominal pain, now with concern for suicidal ideation. He notes that after that evaluation, soon after returning home he found his doors locked, personal belongings were removed, and his girlfriend had departed. He notes that he soon thereafter felt despondent, suicidal, and returns for evaluation.  He notes that he has not had any additional alcohol, nor any new medication, or anything to eat since discharge, repeating that he feels particularly despondent. He denies new physical pain. Since onset no clear alleviating or exacerbating factors.  Past Medical History:  Diagnosis Date  . Alcoholic ([facility])   . Anxiety   . COPD (chronic obstructive pulmonary disease) ([facility])   . Depression   . Hepatitis C   . Homelessness   . Malingering   . Peripheral neuropathy   . Polysubstance abuse [facility])     Patient Active Problem List   Diagnosis Date Noted  . Major depressive disorder, recurrent episode, moderate ([facility])   . Substance abuse ([facility])   . Suicidal ideation   . Sprained ankle 10/28/2014  . Substance or medication-induced bipolar and related disorder with onset during intoxication ([facility]) 10/26/2014  . Alcohol use disorder, severe, dependence ([facility]) 10/26/2014  . Cannabis use disorder, severe, dependence ([facility]) 10/26/2014  . Gastrointestinal bleeding, lower 02/14/2014    Past Surgical History:  Procedure Laterality Date  . HIP SURGERY          Home Medications    Prior to Admission medications   Medication Sig Start Date End Date Taking? Authorizing Provider  albuterol (PROVENTIL HFA;VENTOLIN HFA) 108 (90 Base) MCG/ACT inhaler Inhale 1-2 puffs into the  lungs every 6 (six) hours as needed. 01/13/18   [provider]  benzonatate (TESSALON) 100 MG capsule Take 1 capsule (100 mg total) by mouth every 8 (eight) hours as needed for cough. Patient not taking: Reported on 05/03/2018 02/28/15   Withrow, Everardo AllJohn C, FNP  chlordiazePOXIDE (LIBRIUM) 25 MG capsule 50mg  PO TID x 1D, then 25-50mg  PO BID X 1D, then 25-50mg  PO QD X 1D 05/03/18   Graciella FreerLayden, Lindsey A, PA-C  nicotine (NICODERM CQ - DOSED IN MG/24 HOURS) 21 mg/24hr patch Place 1 patch (21 mg total) onto the skin daily. Patient not taking: Reported on 05/03/2018 02/28/15   Beau FannyWithrow, John C, FNP  traZODone (DESYREL) 50 MG tablet Take 1 tablet (50 mg total) by mouth at bedtime and may repeat dose one time if needed. Patient not taking: Reported on 05/03/2018 02/28/15   Beau FannyWithrow, John C, FNP    Family History No family history on file.  Social History Social History   Tobacco Use  . Smoking status: Current Every Day Smoker    Packs/day: 0.50    Years: 15.00    Pack years: 7.50    Types: Cigarettes  . Smokeless tobacco: Never Used  Substance Use Topics  . Alcohol use: Yes    Comment: 1/5 daily  . Drug use: Yes    Types: Cocaine, Marijuana    Comment: heroin     Allergies   Fentanyl; Hydromorphone hcl; Morphine; Pantoprazole; Pantoprazole sodium; Nsaids; Morphine and related; Ibuprofen; Ketorolac; and Pantoprazole sodium   Review of Systems Review of Systems  Constitutional:       Per HPI, otherwise negative  HENT:       Per HPI, otherwise negative  Respiratory:       Per HPI, otherwise negative  Cardiovascular:       Per HPI, otherwise negative  Gastrointestinal: Positive for nausea. Negative for vomiting.  Endocrine:       Negative aside from HPI  Genitourinary:       Neg aside from HPI   Musculoskeletal:       Per HPI, otherwise negative  Skin: Negative.   Neurological: Negative for syncope.  Psychiatric/Behavioral: Positive for suicidal ideas. The patient is  nervous/anxious.      Physical Exam Updated Vital Signs BP (!) 159/100 (BP Location: Left Arm)   Pulse 77   Temp 98.1 F (36.7 C) (Oral)   Resp 18   Wt 65.8 kg   SpO2 98%   BMI 19.13 kg/m   Physical Exam  Constitutional: He is oriented to person, place, and time. No distress.  Unkempt thin elderly appearing male who appears calm  HENT:  Head: Normocephalic and atraumatic.  Eyes: Conjunctivae and EOM are normal.  Cardiovascular: Normal rate and regular rhythm.  Pulmonary/Chest: Effort normal. No stridor. No respiratory distress.  Abdominal: He exhibits no distension.  Musculoskeletal: He exhibits no edema.  Neurological: He is alert and oriented to person, place, and time.  Skin: Skin is warm and dry.  Psychiatric: His speech is delayed. He is slowed and withdrawn. Cognition and memory are not impaired. He expresses suicidal ideation.  Nursing note and vitals reviewed.    ED Treatments / Results  Labs (all labs ordered are listed, but only abnormal results are displayed) Labs Reviewed  COMPREHENSIVE METABOLIC PANEL  ETHANOL  CBC WITH DIFFERENTIAL/PLATELET     Procedures Procedures (including critical care time)   Initial Impression / Assessment and Plan / ED Course  I have reviewed the triage vital signs and the nursing notes.  Pertinent labs & imaging results that were available during my care of the patient were reviewed by me and considered in my medical decision making (see chart for details).  Patient reviewed the patient's chart including documentation from today's visit, which was previously conducted by the physician assistant and myself. Patient also has multiple other recent ED visits here and other healthcare facilities.  Patient with multiple medical issues including polysubstance abuse presents same day as being evaluated for medic complaints, now with concern for suicidal ideation. Patient is awake, alert, calm, without new physical complaints,  without notable physical exam findings, and has been medically cleared for psychiatric evaluation.  I discussed the patient's case with our behavioral health colleagues, who will observe the patient overnight, for a.m. psychiatry evaluation. Final Clinical Impressions(s) / ED Diagnoses  Suicidal ideation   [name], Robbi Scurlock, MD 05/03/18 2318

## 2018-05-03 NOTE — ED Triage Notes (Signed)
[facility]IB [facility]. Pt dc'd from [facility] last night. Pt now c/o hematemesis, weakness and dizziness starting around 1230. Pt is homeless, is said to have an apt available on the 21st. Pt has hx of cirrhosis, ems reports abd is distended. Pt last drink x2days ago. Pt c/o pain 10/10, given 100mcg fentanyl and 500 ml NS by EMS.

## 2018-05-03 NOTE — BH Assessment (Addendum)
Assessment Note  [name] [name] is an 57 y.o. male who presents to the ED voluntarily. Per chart review, pt was evaluated in ED earlier this date due to abdominal pain. Pt left the ED and returned several hours later stating he is now suicidal. Pt states when he left the ED he went home to find out that his girlfriend kicked him out of the home. Pt states he is now homeless. Pt states he has several medical issues including COPD, cirrhosis of the liver, and Hep C. Pt states he no longer wants to live this way and he now has thoughts of suicide.   Pt presents as inconsistent throughout the assessment. Pt states that he is depressed and suicidal, however he also states he has plans for the future. Pt states he is planning to apply for disability and he is about to get his own place with the help of an outreach program. Pt continues to express inconsistencies during the assessment as he states he wants to end it all and commit suicide, and several minutes later he shares his plans for his future such as moving into his own home.   Pt is calm and cooperative during the assessment. Pt is placing mayonnaise and mustard on a sandwich and eating throughout the assessment. Pt has a hx of secondary gain and malingering due to homelessness. Pt has multiple ED visits c/o alcohol dependence. Pt denies that he has ever attempted suicide but states he has thought about it multiple times in the past. Pt denies HI and denies AVH at present.   Per Nira ConnJason Berry, NP pt is recommended for continued observation for safety and to be reassessed in the AM by psych. EDP [name], [name], MD and pt's nurse [name], [name], RN have been advised of the disposition.  Diagnosis: MDD, recurrent, severe, w/o psychosis; Alcohol use disorder, severe; Cannabis use disorder, severe  Past Medical History:  Past Medical History:  Diagnosis Date  . Alcoholic (HCC)   . Anxiety   . COPD (chronic obstructive pulmonary disease) (HCC)   .  Depression   . Hepatitis C   . Homelessness   . Malingering   . Peripheral neuropathy   . Polysubstance abuse [facility](HCC)     Past Surgical History:  Procedure Laterality Date  . HIP SURGERY      Family History: No family history on file.  Social History:  reports that he has been smoking cigarettes. He has a 7.50 pack-year smoking history. He has never used smokeless tobacco. He reports that he drinks alcohol. He reports that he has current or past drug history. Drugs: Cocaine and Marijuana.  Additional Social History:  Alcohol / Drug Use Pain Medications: See MAR Prescriptions: See MAR Over the Counter: See MAR History of alcohol / drug use?: Yes Longest period of sobriety (when/how long): 8 years Negative Consequences of Use: Financial, Legal, Personal relationships, Work / School Withdrawal Symptoms: DTs, Seizures Onset of Seizures: unknown Date of most recent seizure: 2014 Substance #1 Name of Substance 1: Alcohol 1 - Age of First Use: 10 1 - Amount (size/oz): pt reports he has been binge drinking for the past 3 days 1 - Frequency: daily 1 - Duration: ongoing 1 - Last Use / Amount: 05/03/18 Substance #2 Name of Substance 2: Cannabis 2 - Age of First Use: 10 2 - Amount (size/oz): varies 2 - Frequency: couple times a week  2 - Duration: ongoing 2 - Last Use / Amount: last week   CIWA: CIWA-Ar  BP: (!) 159/100 Pulse Rate: 77 Nausea and Vomiting: no nausea and no vomiting Tactile Disturbances: none Tremor: not visible, but can be felt fingertip to fingertip Auditory Disturbances: not present Paroxysmal Sweats: no sweat visible Visual Disturbances: not present Anxiety: mildly anxious Headache, Fullness in Head: none present Agitation: normal activity Orientation and Clouding of Sensorium: oriented and can do serial additions CIWA-Ar Total: 2 COWS:    Allergies:  Allergies  Allergen Reactions  . Fentanyl Itching  . Hydromorphone Hcl Itching    Pt  States itching  all over Pt  States itching all over Pt  States itching all over   . Morphine Itching  . Pantoprazole Hives  . Pantoprazole Sodium Hives  . Nsaids Nausea And Vomiting, Other (See Comments) and Hives    Severe cramping Other reaction(s): Abdominal [facility]  . Morphine And Related Itching  . Ibuprofen   . Ketorolac Other (See Comments)  . Pantoprazole Sodium Hives    Home Medications:  (Not in a hospital admission)  OB/GYN Status:  No LMP for male patient.  General Assessment Data Location of Assessment: [facility] TTS Assessment: In system Is this a Tele or Face-to-Face Assessment?: Face-to-Face Is this an Initial Assessment or a Re-assessment for this encounter?: Initial Assessment Marital status: Single Is patient pregnant?: No Pregnancy Status: No Living Arrangements: Other (Comment)(homeless) Can pt return to current living arrangement?: Yes Admission Status: Voluntary Is patient capable of signing voluntary admission?: Yes Referral Source: Self/Family/Friend Insurance type: none     Crisis Care Plan Living Arrangements: Other (Comment)(homeless) Name of Psychiatrist: none Name of Therapist: none  Education Status Is patient currently in school?: No Is the patient employed, unemployed or receiving disability?: Unemployed  Risk to self with the past 6 months Suicidal Ideation: Yes-Currently Present Has patient been a risk to self within the past 6 months prior to admission? : No Suicidal Intent: No Has patient had any suicidal intent within the past 6 months prior to admission? : No Is patient at risk for suicide?: Yes Suicidal Plan?: Yes-Currently Present Has patient had any suicidal plan within the past 6 months prior to admission? : Yes Specify Current Suicidal Plan: pt states he has thoughts of cutting his wrists  Access to Means: Yes Specify Access to Suicidal Means: pt reports he has access to knives What has been your use of  drugs/alcohol within the last 12 months?: daily alcohol and occasional cannabis use  Previous Attempts/Gestures: No Other Self Harm Risks: hx of substance abuse, homelessness, access to suicidal methods  Triggers for Past Attempts: None known Intentional Self Injurious Behavior: None Family Suicide History: No Recent stressful life event(s): Conflict (Comment), Financial Problems, Recent negative physical changes, Loss (Comment)(girlfriend broke up with him ) Persecutory voices/beliefs?: No Depression: Yes Depression Symptoms: Feeling angry/irritable, Feeling worthless/self pity, Insomnia Substance abuse history and/or treatment for substance abuse?: Yes Suicide prevention information given to non-admitted patients: Not applicable  Risk to Others within the past 6 months Homicidal Ideation: No Does patient have any lifetime risk of violence toward others beyond the six months prior to admission? : No Thoughts of Harm to Others: No Current Homicidal Intent: No Current Homicidal Plan: No Access to Homicidal Means: No History of harm to others?: No Assessment of Violence: None Noted Does patient have access to weapons?: Yes (Comment)(knives) Criminal Charges Pending?: No Does patient have a court date: No Is patient on probation?: No  Psychosis Hallucinations: None noted Delusions: None noted  Mental Status  Report Appearance/Hygiene: Unremarkable, In scrubs Eye Contact: Good Motor Activity: Freedom of movement Speech: Logical/coherent Level of Consciousness: Alert Mood: Euthymic Affect: Appropriate to circumstance Anxiety Level: None Thought Processes: Coherent, Relevant Judgement: Unimpaired Orientation: Person, Place, Time, Situation, Appropriate for developmental age Obsessive Compulsive Thoughts/Behaviors: None  Cognitive Functioning Concentration: Normal Memory: Recent Intact, Remote Intact Is patient IDD: No Is patient DD?: No Insight: Fair Impulse Control:  Fair Appetite: Good Have you had any weight changes? : No Change Sleep: Decreased Total Hours of Sleep: 4 Vegetative Symptoms: None  ADLScreening [facility] Assessment Services) Patient's cognitive ability adequate to safely complete daily activities?: Yes Patient able to express need for assistance with ADLs?: Yes Independently performs ADLs?: Yes (appropriate for developmental age)  Prior Inpatient Therapy Prior Inpatient Therapy: Yes Prior Therapy Dates: 2019 and mult others Prior Therapy Facilty/Provider(s): [facility] Reason for Treatment: SI, ALCOHOL DEPENDENCE   Prior Outpatient Therapy Prior Outpatient Therapy: No Does patient have an ACCT team?: No Does patient have Intensive In-House Services?  : No Does patient have [facility] services? : No Does patient have P4CC services?: No  ADL Screening (condition at time of admission) Patient's cognitive ability adequate to safely complete daily activities?: Yes Is the patient deaf or have difficulty hearing?: No Does the patient have difficulty seeing, even when wearing glasses/contacts?: No Does the patient have difficulty concentrating, remembering, or making decisions?: No Patient able to express need for assistance with ADLs?: Yes Does the patient have difficulty dressing or bathing?: No Independently performs ADLs?: Yes (appropriate for developmental age) Does the patient have difficulty walking or climbing stairs?: Yes(needs hip replacement ) Weakness of Legs: Both Weakness of Arms/Hands: Right  Home Assistive Devices/Equipment Home Assistive Devices/Equipment: None    Abuse/Neglect Assessment (Assessment to be complete while patient is alone) Abuse/Neglect Assessment Can Be Completed: Yes Physical Abuse: Denies Verbal Abuse: Denies Sexual Abuse: Denies Exploitation of patient/patient's resources: Denies Self-Neglect: Denies     Merchant navy officer (For Healthcare) Does Patient Have a Medical Advance Directive?:  No Would patient like information on creating a medical advance directive?: No - Patient declined    Additional Information 1:1 In Past 12 Months?: No CIRT Risk: No Elopement Risk: No Does patient have medical clearance?: Yes     Disposition: Per [name], NP pt is recommended for continued observation for safety and to be reassessed in the AM by psych. EDP Gerhard Munch, MD and pt's nurse Manon Hilding, RN have been advised of the disposition. Disposition Initial Assessment Completed for this Encounter: Yes Disposition of Patient: (OVERNIGHT OBS PENDING AM PSYCH ASSESSMENT ) Patient refused recommended treatment: No  On Site Evaluation by:   Reviewed with Physician:    Karolee Ohs 05/03/2018 10:48 PM

## 2018-05-03 NOTE — ED Provider Notes (Signed)
[facility]-EMERGENCY DEPT Provider Note   CSN: [id number] Arrival date & time: 05/03/18  1400     History   Chief Complaint Chief Complaint  Patient presents with  . Hematemesis  . Weakness    HPI [name] [name] is a 57 y.o. male with PMH/o alcohol abuse, anxiety, COPD, hepatitis C, malingering, polysubstance abuse who presents for evaluation of abdominal pain, hematemesis, generalized weakness and lightheadedness that began approxi-12:30 PM this afternoon.  Patient reports that he was [facility].  He states that overnight, he started developing worsening abdominal pain.  He states that he has had chronic abdominal pain related to his cirrhosis were last several months but states that over the last few weeks it is gotten worse.  He reports particular "liver pain."  He states that nothing really makes it better except for when he pushes against his liver.  Patient states that he had one episode of vomiting today.  He reports that there is some bright red blood in his vomiting.  He denies any coffee-ground emesis.  He only had one episode.  He denies any history of esophageal varices.  Patient also reports that he was also a grocery store and felt like he was going to pass out.  He states he had generalized weakness with no focal point.  He states he did not lose consciousness or hit his head.  Patient reports that his last drink was approximately 2 days ago.  He does report that he is gone into DTs before.  He states that he is trying to cut down the amount of alcohol he drinks.  Patient denies any vision changes, chest pain, difficulty breathing, dysuria, hematuria.  The history is provided by the patient.    Past Medical History:  Diagnosis Date  . Alcoholic (HCC)   . Anxiety   . COPD (chronic obstructive pulmonary disease) (HCC)   . Depression   . Hepatitis C   . Homelessness   . Malingering   . Peripheral neuropathy   .  Polysubstance abuse [facility](HCC)     Patient Active Problem List   Diagnosis Date Noted  . Major depressive disorder, recurrent episode, moderate (HCC)   . Substance abuse (HCC)   . Suicidal ideation   . Sprained ankle 10/28/2014  . Substance or medication-induced bipolar and related disorder with onset during intoxication (HCC) 10/26/2014  . Alcohol use disorder, severe, dependence (HCC) 10/26/2014  . Cannabis use disorder, severe, dependence (HCC) 10/26/2014  . Gastrointestinal bleeding, lower 02/14/2014    Past Surgical History:  Procedure Laterality Date  . HIP SURGERY          Home Medications    Prior to Admission medications   Medication Sig Start Date End Date Taking? Authorizing Provider  albuterol (PROVENTIL HFA;VENTOLIN HFA) 108 (90 Base) MCG/ACT inhaler Inhale 1-2 puffs into the lungs every 6 (six) hours as needed. 01/13/18  Yes [provider]  benzonatate (TESSALON) 100 MG capsule Take 1 capsule (100 mg total) by mouth every 8 (eight) hours as needed for cough. Patient not taking: Reported on 05/03/2018 02/28/15   Withrow, Everardo AllJohn C, FNP  chlordiazePOXIDE (LIBRIUM) 25 MG capsule 50mg  PO TID x 1D, then 25-50mg  PO BID X 1D, then 25-50mg  PO QD X 1D 05/03/18   Graciella FreerLayden, Aline Wesche A, PA-C  nicotine (NICODERM CQ - DOSED IN MG/24 HOURS) 21 mg/24hr patch Place 1 patch (21 mg total) onto the skin daily. Patient not taking: Reported on 05/03/2018 02/28/15  Withrow, Everardo All, FNP  traZODone (DESYREL) 50 MG tablet Take 1 tablet (50 mg total) by mouth at bedtime and may repeat dose one time if needed. Patient not taking: Reported on 05/03/2018 02/28/15   Beau Fanny, FNP    Family History History reviewed. No pertinent family history.  Social History Social History   Tobacco Use  . Smoking status: Current Every Day Smoker    Packs/day: 0.50    Years: 15.00    Pack years: 7.50    Types: Cigarettes  . Smokeless tobacco: Never Used  Substance Use Topics  . Alcohol use: Yes     Comment: 1/5 daily  . Drug use: Yes    Types: Cocaine, Marijuana    Comment: heroin     Allergies   Fentanyl; Hydromorphone hcl; Morphine; Pantoprazole; Pantoprazole sodium; Nsaids; Morphine and related; Ibuprofen; Ketorolac; and Pantoprazole sodium   Review of Systems Review of Systems  Constitutional: Negative for fever.  Respiratory: Negative for cough and shortness of breath.   Cardiovascular: Negative for chest pain.  Gastrointestinal: Positive for abdominal pain and vomiting. Negative for nausea.  Genitourinary: Negative for dysuria and hematuria.  Neurological: Positive for weakness and light-headedness. Negative for headaches.  All other systems reviewed and are negative.    Physical Exam Updated Vital Signs BP (!) 136/98   Pulse 72   Temp 98.1 F (36.7 C) (Oral)   Resp 19   Ht 6\' 1"  (1.854 m)   Wt 65.8 kg   SpO2 99%   BMI 19.13 kg/m   Physical Exam  Constitutional: He is oriented to person, place, and time. He appears well-developed and well-nourished.  HENT:  Head: Normocephalic and atraumatic.  Mouth/Throat: Oropharynx is clear and moist and mucous membranes are normal.  Eyes: Pupils are equal, round, and reactive to light. Conjunctivae, EOM and lids are normal.  Neck: Full passive range of motion without pain.  Cardiovascular: Normal rate, regular rhythm, normal heart sounds and normal pulses. Exam reveals no gallop and no friction rub.  No murmur heard. Pulmonary/Chest: Effort normal and breath sounds normal.  Lungs clear to auscultation bilaterally.  Symmetric chest rise.  No wheezing, rales, rhonchi.  Abdominal: Soft. Normal appearance. There is generalized tenderness. There is no rigidity and no guarding.  Abdomen is soft, nondistended.  Diffuse generalized tenderness slightly worse in the right upper quadrant enlarged palpable liver.  No rigidity, guarding.  Musculoskeletal: Normal range of motion.  Neurological: He is alert and oriented to person,  place, and time.  Cranial nerves III-XII intact Follows commands, Moves all extremities  5/5 strength to BUE and BLE  Sensation intact throughout all major nerve distributions Normal finger to nose. No dysdiadochokinesia. No pronator drift. No gait abnormalities  No slurred speech. No facial droop.   Skin: Skin is warm and dry. Capillary refill takes less than 2 seconds.  Psychiatric: He has a normal mood and affect. His speech is normal.  Nursing note and vitals reviewed.    ED Treatments / Results  Labs (all labs ordered are listed, but only abnormal results are displayed) Labs Reviewed  COMPREHENSIVE METABOLIC PANEL - Abnormal; Notable for the following components:      Result Value   Glucose, Bld 105 (*)    Calcium 8.5 (*)    Albumin 3.3 (*)    AST 506 (*)    ALT 434 (*)    Total Bilirubin 1.4 (*)    All other components within normal limits  LIPASE, BLOOD - Abnormal;  Notable for the following components:   Lipase 53 (*)    All other components within normal limits  CBC WITH DIFFERENTIAL/PLATELET - Abnormal; Notable for the following components:   RBC 3.81 (*)    Hemoglobin 12.9 (*)    HCT 37.4 (*)    All other components within normal limits  URINALYSIS, ROUTINE W REFLEX MICROSCOPIC - Abnormal; Notable for the following components:   Color, Urine AMBER (*)    All other components within normal limits  RAPID URINE DRUG SCREEN, HOSP PERFORMED - Abnormal; Notable for the following components:   Opiates   (*)    Value: Result not available. Reagent lot number recalled by manufacturer.   Benzodiazepines POSITIVE (*)    Tetrahydrocannabinol POSITIVE (*)    All other components within normal limits  ETHANOL  TROPONIN I  PROTIME-INR    EKG EKG Interpretation  Date/Time:  Saturday May 03 2018 14:44:27 EDT Ventricular Rate:  83 PR Interval:    QRS Duration: 79 QT Interval:  387 QTC Calculation: 455 R Axis:   73 Text Interpretation:  Sinus rhythm Consider  left atrial enlargement Baseline wander in lead(s) II Abnormal ekg Confirmed by [name], [name] [phone]([id number]) on 05/03/2018 4:03:59 PM Also confirmed by [name], [name] ([id number]), editor Barbette Hairassel, Kerry [phone]([id number])  on 05/03/2018 4:37:43 PM   Radiology No results found.  Procedures Procedures (including critical care time)  Medications Ordered in ED Medications  thiamine (B-1) injection 100 mg (100 mg Intravenous Given 05/03/18 1639)  LORazepam (ATIVAN) injection 1 mg (1 mg Intravenous Given 05/03/18 1640)  sodium chloride 0.9 % bolus 1,000 mL (0 mLs Intravenous Stopped 05/03/18 1837)  fentaNYL (SUBLIMAZE) injection 50 mcg (50 mcg Intravenous Given 05/03/18 1642)  chlordiazePOXIDE (LIBRIUM) capsule 25 mg (25 mg Oral Given 05/03/18 1845)     Initial Impression / Assessment and Plan / ED Course  I have reviewed the triage vital signs and the nursing notes.  Pertinent labs & imaging results that were available during my care of the patient were reviewed by me and considered in my medical decision making (see chart for details).     57 year old male with past medical history of alcohol abuse, anxiety, COPD, hepatitis C, malingering, polysubstance abuse presents for evaluation of worsening chronic abdominal pain, generalized weakness, hematemesis.  [facility]  History of alcohol abuse and reports his last drink was 2 days ago.  Does report he is got into seizures before.  Not recall history of esophageal varices. Patient is afebrile, non-toxic appearing, sitting comfortably on examination table. Vital signs reviewed and stable.  No neuro deficits noted on exam.  Consider dehydration versus infectious etiology.  History/physical exam is not concerning for CVA.  Plan to check basic labs.  Ethanol level unremarkable.  Troponin unremarkable.  Lipase is slightly elevated at 53.  This is similar from lipase 2 days ago.  CBC shows white blood cell count is 4.0.  Hemoglobin is 12.9  hematocrit is 37.4.  This is improved from CBC approximately 2 days ago.  CMP shows AST and ALT are elevated at 506 and 434 respectively.  His total bili is slightly elevated at 1.4.  This is increased from lab work 2 days ago but he has had previous elevations in his bili before.  His AST and ALT are slightly improved from lab work done 2 days ago.  Urine is positive for benzos, marijuana.  UA negative for any infection.  Reevaluation.  Patient reports improvement in pain.  Vital signs are stable.  Repeat abdominal exam is improved.  He still slightly tender in the right upper quadrant.  I suspect this is likely due to his chronic cirrhosis.  His abdomen is not rigid, guarding that would be concerning for any intra-abdominal abnormality.  We will plan to p.o. challenge patient.  Patient able to tolerate p.o. without any difficulty.  Vital signs are stable.  Patient stable for discharge at this time.  Will give Librium to help with alcohol detoxification.  Additionally, given outpatient substance abuse resources. Patient had ample opportunity for questions and discussion. All patient's questions were answered with full understanding. Strict return precautions discussed. Patient expresses understanding and agreement to plan.   Final Clinical Impressions(s) / ED Diagnoses   Final diagnoses:  Generalized weakness  Abdominal pain, chronic, right upper quadrant    ED Discharge Orders         Ordered    chlordiazePOXIDE (LIBRIUM) 25 MG capsule     05/03/18 1809           Maxwell Caul, PA-C 05/03/18 2344    Gerhard Munch, MD 05/03/18 2349

## 2018-05-03 NOTE — Progress Notes (Signed)
Per Nira ConnJason Berry, NP pt is recommended for continued observation for safety and to be reassessed in the AM by psych. EDP [name], [name], MD and pt's nurse [name], [name], RN have been advised of the disposition.  [name], MSW, LCSW Therapeutic Triage Specialist  [phone]

## 2018-05-03 NOTE — ED Notes (Signed)
Bed: [id number] Expected date:  Expected time:  Means of arrival:  Comments: Vomiting Blood seen at [facility] yesterday for same

## 2018-05-03 NOTE — ED Triage Notes (Signed)
Patient here from home with complaints of suicidal ideation. Reports that girlfriend broke up with him. Plan to slit wrist. Homeless. Reports that he is also upset about his diagnosis of cirrhosis of the liver. Alcoholic.

## 2018-05-04 ENCOUNTER — Emergency Department (HOSPITAL_COMMUNITY)
Admission: EM | Admit: 2018-05-04 | Discharge: 2018-05-05 | Disposition: A | Discharge status: Home / Self Care | Payer: Self-pay | Attending: Emergency Medicine | Admitting: Emergency Medicine

## 2018-05-04 ENCOUNTER — Other Ambulatory Visit: Payer: Self-pay

## 2018-05-04 ENCOUNTER — Emergency Department (HOSPITAL_COMMUNITY): Payer: Self-pay

## 2018-05-04 DIAGNOSIS — F17210 Nicotine dependence, cigarettes, uncomplicated: Secondary | ICD-10-CM | POA: Insufficient documentation

## 2018-05-04 DIAGNOSIS — F10920 Alcohol use, unspecified with intoxication, uncomplicated: Secondary | ICD-10-CM

## 2018-05-04 DIAGNOSIS — R0602 Shortness of breath: Secondary | ICD-10-CM | POA: Insufficient documentation

## 2018-05-04 DIAGNOSIS — R45851 Suicidal ideations: Secondary | ICD-10-CM

## 2018-05-04 DIAGNOSIS — R1011 Right upper quadrant pain: Secondary | ICD-10-CM

## 2018-05-04 DIAGNOSIS — F1020 Alcohol dependence, uncomplicated: Secondary | ICD-10-CM | POA: Insufficient documentation

## 2018-05-04 DIAGNOSIS — R7989 Other specified abnormal findings of blood chemistry: Secondary | ICD-10-CM

## 2018-05-04 DIAGNOSIS — E860 Dehydration: Secondary | ICD-10-CM

## 2018-05-04 DIAGNOSIS — F1014 Alcohol abuse with alcohol-induced mood disorder: Secondary | ICD-10-CM

## 2018-05-04 DIAGNOSIS — J449 Chronic obstructive pulmonary disease, unspecified: Secondary | ICD-10-CM | POA: Insufficient documentation

## 2018-05-04 DIAGNOSIS — F1994 Other psychoactive substance use, unspecified with psychoactive substance-induced mood disorder: Secondary | ICD-10-CM | POA: Insufficient documentation

## 2018-05-04 DIAGNOSIS — F1010 Alcohol abuse, uncomplicated: Secondary | ICD-10-CM

## 2018-05-04 LAB — I-STAT CG4 LACTIC ACID, ED: LACTIC ACID, VENOUS: 3.21 mmol/L — AB (ref 0.5–1.9)

## 2018-05-04 LAB — CBC WITH DIFFERENTIAL/PLATELET
BASOS ABS: 0 10*3/uL (ref 0.0–0.1)
BASOS PCT: 0 %
EOS ABS: 0.1 10*3/uL (ref 0.0–0.7)
EOS PCT: 1 %
HCT: 38.9 % — ABNORMAL LOW (ref 39.0–52.0)
Hemoglobin: 13.2 g/dL (ref 13.0–17.0)
Lymphocytes Relative: 33 %
Lymphs Abs: 1.7 10*3/uL (ref 0.7–4.0)
MCH: 33.4 pg (ref 26.0–34.0)
MCHC: 33.9 g/dL (ref 30.0–36.0)
MCV: 98.5 fL (ref 78.0–100.0)
MONO ABS: 0.4 10*3/uL (ref 0.1–1.0)
Monocytes Relative: 7 %
Neutro Abs: 3 10*3/uL (ref 1.7–7.7)
Neutrophils Relative %: 59 %
PLATELETS: 254 10*3/uL (ref 150–400)
RBC: 3.95 MIL/uL — AB (ref 4.22–5.81)
RDW: 15.5 % (ref 11.5–15.5)
WBC: 5.2 10*3/uL (ref 4.0–10.5)

## 2018-05-04 LAB — CBG MONITORING, ED: GLUCOSE-CAPILLARY: 96 mg/dL (ref 70–99)

## 2018-05-04 LAB — ETHANOL: ALCOHOL ETHYL (B): 239 mg/dL — AB (ref <10)

## 2018-05-04 LAB — PROTIME-INR
INR: 1.02
PROTHROMBIN TIME: 13.3 s (ref 11.4–15.2)

## 2018-05-04 LAB — I-STAT TROPONIN, ED: TROPONIN I, POC: 0 ng/mL (ref 0.00–0.08)

## 2018-05-04 MED ORDER — IOPAMIDOL (ISOVUE-300) INJECTION 61%
INTRAVENOUS | Status: AC
  Filled 2018-05-04: qty 100

## 2018-05-04 MED ORDER — IOPAMIDOL (ISOVUE-300) INJECTION 61%
100.0000 mL | Freq: Once | INTRAVENOUS | Status: AC | PRN
  Administered 2018-05-04: 100 mL via INTRAVENOUS

## 2018-05-04 MED ORDER — SODIUM CHLORIDE 0.9 % IV BOLUS
1000.0000 mL | Freq: Once | INTRAVENOUS | Status: AC
  Administered 2018-05-04: 1000 mL via INTRAVENOUS

## 2018-05-04 MED ORDER — GABAPENTIN 300 MG PO CAPS
300.0000 mg | ORAL_CAPSULE | Freq: Two times a day (BID) | ORAL | Status: DC
  Administered 2018-05-04: 300 mg via ORAL
  Filled 2018-05-04: qty 1

## 2018-05-04 NOTE — ED Triage Notes (Signed)
Per [facility], pt initially c/o SOB, upon arrival pt stated "I'm suicidal".

## 2018-05-04 NOTE — ED Notes (Signed)
Bed: [id number] Expected date:  Expected time:  Means of arrival:  Comments: Hold for 27 

## 2018-05-04 NOTE — ED Notes (Addendum)
Pt was oriented to room and unit.  Pt is calm and cooperative.  Pt contracts for safety.  15 minute checks and video monitoring in place.

## 2018-05-04 NOTE — ED Notes (Signed)
Pt threatening to kill staff in triage. Security and off duty called.

## 2018-05-04 NOTE — BH Assessment (Signed)
[facility]      Per Elta GuadeloupeLaurie Parks, NP and Dr. Jannifer FranklinAkintayo, patient can be discharged with referrals to [facility], [facility].

## 2018-05-04 NOTE — ED Notes (Signed)
Pt discharged safely with resources.  Pt was very agitated at discharge although he had not shown any signs of agitation prior to discharge.  Pt had his previous discharge instructions along with his previous RX for librium.  All of his belongings were returned .

## 2018-05-04 NOTE — Consult Note (Addendum)
[facility] - Closed Psych ED Discharge  05/04/2018 12:00 PM [name] [name]  MRN:  [id number] Principal Problem: Cannabis abuse with cannabis-induced disorder [facility]) Discharge Diagnoses:  Patient Active Problem List   Diagnosis Date Noted  . Major depressive disorder, recurrent episode, moderate (HCC) [F33.1]   . Substance abuse (HCC) [F19.10]   . Suicidal ideation [R45.851]   . Sprained ankle [S93.409A] 10/28/2014  . 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 Alcohol use disorder, severe, dependence (HCC) [F10.20] 10/26/2014  . Cannabis use disorder, severe, dependence (HCC) [F12.20] 10/26/2014  . 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 Gastrointestinal bleeding, lower [K92.2] 02/14/2014    Subjective: Pt was seen and chart reviewed with treatment team and Dr Jannifer Franklin. Pt denies suicidal/homicidal ideation, denies auditory/visual hallucinations and does not appear to be responding to internal stimuli. Pt was at the [facility] and discharged, he went home to find the doors locked and his girlfriend had left him. He returned to the [facility] a short time after discharge stating he was suicidal. Upon assessment he denied suicidal ideation and stated his reason for returning was because he had nowhere to sleep. Pt was offered and declined outpatient resources and shelter resources. Pt's UDS positive for benzos and THC, BAL negative.  Pt is stable and psychiatrically clear for discharge.   Total Time spent with patient: 45 minutes  Past Psychiatric History: As above  Past Medical History:  Past Medical History:  Diagnosis Date  . Alcoholic (HCC)   . Anxiety   . COPD (chronic obstructive pulmonary disease) (HCC)   . Depression   . Hepatitis C   . Homelessness   . Malingering   . Peripheral neuropathy   . Polysubstance abuse [facility])    Past Surgical History:  Procedure Laterality Date  . HIP  SURGERY     Family History: No family history on file. Family Psychiatric  History: Unknown Social History:  Social History   Substance and Sexual Activity  Alcohol Use Yes   Comment: 1/5 daily    Social History   Substance and Sexual Activity  Drug Use Yes  . Types: Cocaine, Marijuana   Comment: heroin   Social History   Socioeconomic History  . Marital status: Single    Spouse name: Not on file  . Number of children: Not on file  . Years of education: Not on file  . Highest education level: Not on file  Occupational History  . Not on file  Social Needs  . Financial resource strain: Not on file  . Food insecurity:    Worry: Not on file    Inability: Not on file  . Transportation needs:    Medical: Not on file    Non-medical: Not on file  Tobacco Use  . Smoking status: Current Every Day Smoker    Packs/day: 0.50    Years: 15.00    Pack years: 7.50    Types: Cigarettes  . Smokeless tobacco: Never Used  Substance and Sexual Activity  . Alcohol use: Yes    Comment: 1/5 daily  . Drug use: Yes    Types: Cocaine, Marijuana    Comment: heroin  . Sexual activity: Never  Lifestyle  . Physical activity:    Days per week: Not on file    Minutes per session: Not on file  . Stress: Not on file  Relationships  . Social connections:    Talks on phone: Not on file  Gets together: Not on file    Attends religious service: Not on file    Active member of club or organization: Not on file    Attends meetings of clubs or organizations: Not on file    Relationship status: Not on file  Other Topics Concern  . Not on file  Social History Narrative  . Not on file    Has this patient used any form of tobacco in the last 30 days? (Cigarettes, Smokeless Tobacco, Cigars, and/or Pipes) Prescription not provided because: Pt declined  Current Medications: Current Facility-Administered Medications  Medication Dose Route Frequency Provider Last Rate Last Dose  . gabapentin  (NEURONTIN) capsule 300 mg  300 mg Oral BID Kiffany Schelling, MD   300 mg at 05/04/18 1038  . nicotine (NICODERM CQ - dosed in mg/24 hours) patch 21 mg  21 mg Transdermal Daily [name], [name], MD      . thiamine (VITAMIN B-1) tablet 100 mg  100 mg Oral Daily [name], [name], MD   100 mg at 05/04/18 1038   Or  . thiamine (B-1) injection 100 mg  100 mg Intravenous Daily [name], [name], MD      . traZODone (DESYREL) tablet 50 mg  50 mg Oral QHS,MR X 1 [name], [name], MD   50 mg at 05/03/18 2222   Current Outpatient Medications  Medication Sig Dispense Refill  . albuterol (PROVENTIL HFA;VENTOLIN HFA) 108 (90 Base) MCG/ACT inhaler Inhale 1-2 puffs into the lungs every 6 (six) hours as needed.    . chlordiazePOXIDE (LIBRIUM) 25 MG capsule 50mg  PO TID x 1D, then 25-50mg  PO BID X 1D, then 25-50mg  PO QD X 1D 10 capsule 0  . benzonatate (TESSALON) 100 MG capsule Take 1 capsule (100 mg total) by mouth every 8 (eight) hours as needed for cough. (Patient not taking: Reported on 05/03/2018) 30 capsule 0  . nicotine (NICODERM CQ - DOSED IN MG/24 HOURS) 21 mg/24hr patch Place 1 patch (21 mg total) onto the skin daily. (Patient not taking: Reported on 05/03/2018) 28 patch 0  . traZODone (DESYREL) 50 MG tablet Take 1 tablet (50 mg total) by mouth at bedtime and may repeat dose one time if needed. (Patient not taking: Reported on 05/03/2018) 60 tablet 0    Musculoskeletal: Strength & Muscle Tone: within normal limits Gait & Station: normal Patient leans: N/A  Psychiatric Specialty Exam: Physical Exam  Constitutional: He is oriented to person, place, and time. He appears well-developed and well-nourished.  HENT:  Head: Normocephalic.  Respiratory: Effort normal.  Musculoskeletal: Normal range of motion.  Neurological: He is alert and oriented to person, place, and time.  Psychiatric: His speech is normal and behavior is normal. Thought content normal. Cognition and memory are normal. He expresses  impulsivity. He exhibits a depressed mood.    Review of Systems  Psychiatric/Behavioral: Positive for depression and substance abuse. Negative for hallucinations, memory loss and suicidal ideas. The patient is not nervous/anxious and does not have insomnia.   All other systems reviewed and are negative.   Blood pressure (!) 131/97, pulse 66, temperature 97.9 F (36.6 C), temperature source Oral, resp. rate 19, weight 65.8 kg, SpO2 97 %.Body mass index is 19.13 kg/m.  General Appearance: Casual  Eye Contact:  Good  Speech:  Clear and Coherent  Volume:  Normal  Mood:  Depressed  Affect:  Congruent and Depressed  Thought Process:  Coherent, Goal Directed, Linear and Descriptions of Associations: Intact  Orientation:  Full (Time, Place, and Person)  Thought Content:  Logical  Suicidal Thoughts:  No  Homicidal Thoughts:  No  Memory:  Immediate;   Good Recent;   Good Remote;   Fair  Judgement:  Fair  Insight:  Fair  Psychomotor Activity:  Normal  Concentration:  Concentration: Good and Attention Span: Good  Recall:  Good  Fund of Knowledge:  Good  Language:  Good  Akathisia:  No  Handed:  Right  AIMS (if indicated):     Assets:  Communication Skills Social Support  ADL's:  Intact  Cognition:  WNL  Sleep:        Demographic Factors:  Male, Caucasian, Low socioeconomic status and Unemployed  Loss Factors: Financial problems/change in socioeconomic status  Historical Factors: Impulsivity  Risk Reduction Factors:   Sense of responsibility to family  Continued Clinical Symptoms:  Depression:   Comorbid alcohol abuse/dependence Impulsivity Alcohol/Substance Abuse/Dependencies  Cognitive Features That Contribute To Risk:  Closed-mindedness    Suicide Risk:  Minimal: No identifiable suicidal ideation.  Patients presenting with no risk factors but with morbid ruminations; may be classified as minimal risk based on the severity of the depressive symptoms   Plan Of  Care/Follow-up recommendations:  Activity:  Heart healthy  Disposition: Take all medications as prescribed by your outpatient provider. Keep all follow-up appointments as scheduled.  Do not consume alcohol or use illegal drugs while on prescription medications. Report any adverse effects from your medications to your primary care provider promptly.  In the event of recurrent symptoms or worsening symptoms, call 911, a crisis hotline, or go to the nearest emergency department for evaluation.   Laveda AbbeLaurie Britton Parks, NP 05/04/2018, 12:00 PM  Patient seen face-to-face for psychiatric evaluation, chart reviewed and case discussed with the physician extender and developed treatment plan. Reviewed the information documented and agree with the treatment plan. Thedore MinsMojeed Kayl Stogdill, MD

## 2018-05-04 NOTE — Progress Notes (Signed)
CSW met with patient at bedside. Patient stated he wanted to go inpatient for detox, patient acknowledged understanding of discharge. Patient inquired about transportation voucher to [facility] to utilize their E.D. Patient referred to attending physicians and staff with explicit language.  Patient declined all CSW resources offered. Patient declined to complete VI-SPDAT. Patient declined referrals for [facility], [place], and homeless shelters.   No other CSW needs identified. Signing off.  Stephanie Acre, [place] Social Worker [phone]

## 2018-05-04 NOTE — ED Provider Notes (Addendum)
[facility]-EMERGENCY DEPT Provider Note   CSN: [id number] Arrival date & time: 05/04/18  2225     History   Chief Complaint Chief Complaint  Patient presents with  . Suicidal  . Hypotension  . Homeless  . Homicidal  . Alcohol Problem    HPI [name] [name] is a 57 y.o. male with a hx of alcoholism, anxiety, COPD, depression, hepatitis C, homelessness, malingering, neuropathy, polysubstance abuse, recurrent suicidal ideation presents to the Emergency Department complaining of gradual, persistent, progressively worsening sided abdominal pain and shortness of breath.  Patient reports pain is been ongoing and worsening in the last 4 months.  He reports that his shortness of breath is related to his severe abdominal pain.  He is unable to tell me what has changed tonight.  He reports one episode of hematemesis yesterday but none since that time.  He reports he has been eating and drinking.  Patient reports drinking several 4 logos today.  He denies drug usage.  Patient denies wheezing, cough, URI or fevers.  Additionally, patient complaining of suicidal ideation.  He states very clearly that he has been suicidal since the age of 66.  He reports that today he made a plan to kill himself via police-assisted suicide.  He reports that he was hopeful that they would shoot him.  He then states "I am going to make sure I take some of them out with me."  He is agitated during this discussion.  Patient is vague about whether or not he has access to a weapon.  He denies auditory or visual hallucinations.  The history is provided by the patient and medical records. No language interpreter was used.    Past Medical History:  Diagnosis Date  . Alcoholic (HCC)   . Anxiety   . COPD (chronic obstructive pulmonary disease) (HCC)   . Depression   . Hepatitis C   . Homelessness   . Malingering   . Peripheral neuropathy   . Polysubstance abuse [facility])     Patient Active Problem List     Diagnosis Date Noted  . Major depressive disorder, recurrent episode, moderate (HCC)   . Substance abuse (HCC)   . Suicidal ideation   . Sprained ankle 10/28/2014  . Substance or medication-induced bipolar and related disorder with onset during intoxication (HCC) 10/26/2014  . Alcohol use disorder, severe, dependence (HCC) 10/26/2014  . Cannabis use disorder, severe, dependence (HCC) 10/26/2014  . Cannabis abuse with cannabis-induced disorder (HCC) 04/11/2014  . Gastrointestinal bleeding, lower 02/14/2014    Past Surgical History:  Procedure Laterality Date  . HIP SURGERY          Home Medications    Prior to Admission medications   Medication Sig Start Date End Date Taking? Authorizing Provider  albuterol (PROVENTIL HFA;VENTOLIN HFA) 108 (90 Base) MCG/ACT inhaler Inhale 1-2 puffs into the lungs every 6 (six) hours as needed for wheezing or shortness of breath.  01/13/18  Yes [provider]  chlordiazePOXIDE (LIBRIUM) 25 MG capsule 50mg  PO TID x 1D, then 25-50mg  PO BID X 1D, then 25-50mg  PO QD X 1D 05/03/18   Graciella Freer A, PA-C  nicotine (NICODERM CQ - DOSED IN MG/24 HOURS) 21 mg/24hr patch Place 1 patch (21 mg total) onto the skin daily. Patient not taking: Reported on 05/03/2018 02/28/15   Beau Fanny, FNP  traZODone (DESYREL) 50 MG tablet Take 1 tablet (50 mg total) by mouth at bedtime and may repeat dose one time if needed.  Patient not taking: Reported on 05/03/2018 02/28/15   Beau Fanny, FNP    Family History No family history on file.  Social History Social History   Tobacco Use  . Smoking status: Current Every Day Smoker    Packs/day: 0.50    Years: 15.00    Pack years: 7.50    Types: Cigarettes  . Smokeless tobacco: Never Used  Substance Use Topics  . Alcohol use: Yes    Comment: 1/5 daily  . Drug use: Yes    Types: Cocaine, Marijuana    Comment: heroin     Allergies   Fentanyl; Hydromorphone hcl; Morphine; Pantoprazole; Pantoprazole  sodium; Nsaids; Morphine and related; Ibuprofen; Ketorolac; and Pantoprazole sodium   Review of Systems Review of Systems  Constitutional: Negative for appetite change, diaphoresis, fatigue, fever and unexpected weight change.  HENT: Negative for mouth sores.   Eyes: Negative for visual disturbance.  Respiratory: Positive for shortness of breath. Negative for cough, chest tightness and wheezing.   Cardiovascular: Negative for chest pain.  Gastrointestinal: Positive for abdominal pain, nausea and vomiting ( resolved). Negative for constipation and diarrhea.  Endocrine: Negative for polydipsia, polyphagia and polyuria.  Genitourinary: Negative for dysuria, frequency, hematuria and urgency.  Musculoskeletal: Negative for back pain and neck stiffness.  Skin: Negative for rash.  Allergic/Immunologic: Negative for immunocompromised state.  Neurological: Negative for syncope, light-headedness and headaches.  Hematological: Does not bruise/bleed easily.  Psychiatric/Behavioral: Negative for sleep disturbance. The patient is not nervous/anxious.      Physical Exam Updated Vital Signs BP (!) 87/75 (BP Location: Left Arm)   Pulse (!) 108   Temp 98.4 F (36.9 C) (Oral)   Resp 14   SpO2 99%   Physical Exam  Constitutional: He appears well-developed and well-nourished. No distress.  Awake, alert, nontoxic appearance  HENT:  Head: Normocephalic and atraumatic.  Mouth/Throat: Oropharynx is clear and moist. No oropharyngeal exudate.  Eyes: Conjunctivae are normal. No scleral icterus.  Neck: Normal range of motion. Neck supple.  Cardiovascular: Regular rhythm and intact distal pulses. Tachycardia present.  Pulses:      Radial pulses are 2+ on the right side, and 2+ on the left side.  Pulmonary/Chest: Effort normal. No respiratory distress. He has wheezes ( faint expiratory throughout).  Equal chest expansion  Abdominal: Soft. Bowel sounds are normal. He exhibits no mass. There is tenderness  in the right upper quadrant, right lower quadrant and epigastric area. There is guarding. There is no rigidity, no rebound and no CVA tenderness.  Musculoskeletal: Normal range of motion. He exhibits no edema.  Neurological: He is alert. GCS eye subscore is 4. GCS verbal subscore is 5. GCS motor subscore is 6.  Speech is clear and goal oriented Moves extremities without ataxia  Skin: Skin is warm and dry. He is not diaphoretic.  Psychiatric: He has a normal mood and affect. He is agitated. He is not actively hallucinating. He expresses homicidal and suicidal ideation. He expresses suicidal plans and homicidal plans.  Nursing note and vitals reviewed.    ED Treatments / Results  Labs (all labs ordered are listed, but only abnormal results are displayed) Labs Reviewed  COMPREHENSIVE METABOLIC PANEL - Abnormal; Notable for the following components:      Result Value   Potassium 3.3 (*)    Glucose, Bld 110 (*)    AST 470 (*)    ALT 382 (*)    All other components within normal limits  ETHANOL - Abnormal; Notable for the  following components:   Alcohol, Ethyl (B) 239 (*)    All other components within normal limits  RAPID URINE DRUG SCREEN, HOSP PERFORMED - Abnormal; Notable for the following components:   Opiates   (*)    Value: Result not available. Reagent lot number recalled by manufacturer.   Benzodiazepines POSITIVE (*)    Tetrahydrocannabinol POSITIVE (*)    All other components within normal limits  CBC WITH DIFFERENTIAL/PLATELET - Abnormal; Notable for the following components:   RBC 3.95 (*)    HCT 38.9 (*)    All other components within normal limits  URINALYSIS, ROUTINE W REFLEX MICROSCOPIC - Abnormal; Notable for the following components:   Specific Gravity, Urine >1.046 (*)    All other components within normal limits  LIPASE, BLOOD - Abnormal; Notable for the following components:   Lipase 63 (*)    All other components within normal limits  POC OCCULT BLOOD, ED -  Abnormal; Notable for the following components:   Fecal Occult Bld POSITIVE (*)    All other components within normal limits  I-STAT CG4 LACTIC ACID, ED - Abnormal; Notable for the following components:   Lactic Acid, Venous 3.21 (*)    All other components within normal limits  I-STAT CG4 LACTIC ACID, ED - Abnormal; Notable for the following components:   Lactic Acid, Venous 2.06 (*)    All other components within normal limits  PROTIME-INR  CBG MONITORING, ED  I-STAT TROPONIN, ED  I-STAT CG4 LACTIC ACID, ED  TYPE AND SCREEN  ABO/RH    EKG EKG Interpretation  Date/Time:  Sunday May 04 2018 23:38:24 EDT Ventricular Rate:  97 PR Interval:    QRS Duration: 83 QT Interval:  341 QTC Calculation: 434 R Axis:   79 Text Interpretation:  Sinus rhythm Minimal ST elevation, anterior leads Confirmed by Margarita Grizzle [phone]) on 05/04/2018 11:42:00 PM   Radiology Dg Chest 2 View  Result Date: 05/04/2018 CLINICAL DATA:  Shortness of breath. EXAM: CHEST - 2 VIEW COMPARISON:  05/01/2018 FINDINGS: Cardiomediastinal silhouette is normal. Mediastinal contours appear intact. There is no evidence of focal airspace consolidation, pleural effusion or pneumothorax. Superior endplate deformity of 1 of the upper thoracic vertebral bodies, seen on the lateral view. Soft tissues are grossly normal. IMPRESSION: No active cardiopulmonary disease. Superior endplate deformity of 1 of the upper thoracic vertebral bodies seen on the lateral view. Dedicated thoracic spine radiographs may be considered for better visualization. Electronically Signed   By: Ted Mcalpine M.D.   On: 05/04/2018 23:24   Dg Thoracic Spine 2 View  Result Date: 05/05/2018 CLINICAL DATA:  Abnormal chest x-ray EXAM: THORACIC SPINE 2 VIEWS COMPARISON:  05/04/2018 FINDINGS: Alignment is normal. Superior endplate abnormality in the upper thoracic spine on chest x-ray is not well seen on the thoracic views. Vertebral body heights appear  within normal limits. IMPRESSION: No acute osseous abnormality. Endplate abnormality on chest x-ray is not well seen on the current radiographs. If there is pain referable to the thoracic spine, CT could be obtained to further evaluate. Electronically Signed   By: Jasmine Pang M.D.   On: 05/05/2018 02:01   Ct Abdomen Pelvis W Contrast  Result Date: 05/04/2018 CLINICAL DATA:  Right-sided abdominal pain.  Hypotension. EXAM: CT ABDOMEN AND PELVIS WITH CONTRAST TECHNIQUE: Multidetector CT imaging of the abdomen and pelvis was performed using the standard protocol following bolus administration of intravenous contrast. CONTRAST:  ISOVUE-300 IOPAMIDOL (ISOVUE-300) INJECTION 61% COMPARISON:  Abdominal ultrasound 05/02/2018 FINDINGS: Lower  chest: No acute abnormality. Hepatobiliary: No focal liver abnormality is seen. No gallstones, gallbladder wall thickening, or biliary dilatation. Pancreas: Unremarkable. No pancreatic ductal dilatation or surrounding inflammatory changes. Spleen: Normal in size without focal abnormality. Adrenals/Urinary Tract: Adrenal glands are unremarkable. Kidneys are normal, without renal calculi, focal lesion, or hydronephrosis. Bladder is unremarkable. Stomach/Bowel: Stomach is within normal limits. No evidence of appendicitis. No evidence of bowel wall thickening, distention, or inflammatory changes. Vascular/Lymphatic: Aortic atherosclerosis. No enlarged abdominal or pelvic lymph nodes. IVC filter in place. Reproductive: Prostate is unremarkable. Other: No abdominal wall hernia or abnormality. No abdominopelvic ascites. Musculoskeletal: Mild spondylosis of the lower lumbosacral spine. Post right hip arthroplasty. IMPRESSION: No acute abnormality seen within the abdomen or pelvis. Electronically Signed   By: Ted Mcalpineobrinka  Dimitrova M.D.   On: 05/04/2018 23:59    Procedures Procedures (including critical care time)  Medications Ordered in ED Medications  iopamidol (ISOVUE-300) 61 %  injection (has no administration in time range)  LORazepam (ATIVAN) injection 0-4 mg ( Intravenous See Alternative 05/05/18 0247)    Or  LORazepam (ATIVAN) tablet 0-4 mg (1 mg Oral Given 05/05/18 0247)  LORazepam (ATIVAN) injection 0-4 mg (has no administration in time range)    Or  LORazepam (ATIVAN) tablet 0-4 mg (has no administration in time range)  thiamine (VITAMIN B-1) tablet 100 mg (has no administration in time range)    Or  thiamine (B-1) injection 100 mg (has no administration in time range)  sodium chloride 0.9 % bolus 1,000 mL (0 mLs Intravenous Stopped 05/05/18 0000)  iopamidol (ISOVUE-300) 61 % injection 100 mL (100 mLs Intravenous Contrast Given 05/04/18 2324)  sodium chloride 0.9 % bolus 1,000 mL (1,000 mLs Intravenous New Bag/Given 05/05/18 0147)  potassium chloride SA (K-DUR,KLOR-CON) CR tablet 40 mEq (40 mEq Oral Given 05/05/18 0151)     Initial Impression / Assessment and Plan / ED Course  I have reviewed the triage vital signs and the nursing notes.  Pertinent labs & imaging results that were available during my care of the patient were reviewed by me and considered in my medical decision making (see chart for details).  Clinical Course as of May 05 510  Wynelle LinkSun May 04, 2018  2258 Hypotensive   BP(!): 82/53 [HM]  Mon May 05, 2018  0105 Blood pressure improved after fluids.  BP: 104/71 [HM]  0105 Elevated lactic acid.  Patient is afebrile.  Less likely to be sepsis.  Lactic Acid, Venous(!!): 3.21 [HM]  0105 Elevated specific gravity.  Signs of dehydration.  Additional fluids will be given.  No evidence of urinary tract infection.  Specific Gravity, Urine(!): >1.046 [HM]  0105 Elevated but only slightly above patient's baseline  Lipase(!): 63 [HM]  0105 Mild hypokalemia noted.  Potassium given in the emergency department.  Potassium(!): 3.3 [HM]  0106 AST and ALT are elevated but downtrending  AST(!): 470 [HM]  0106 Confirmation of clinical intoxication  Alcohol,  Ethyl (B)(!): 239 [HM]  0106 Negative troponin  Troponin i, poc: 0.00 [HM]  0200 Significant improvement after fluids.  Lactic Acid, Venous(!!): 2.06 [HM]  0302 TTS - recommends overnight obs and reassessment in the AM   [HM]  0345 Fecal occult is positive however patient with large hemorrhoids on DRE.  Suspect this is the cause of his positive Hemoccult.  No bright red blood, melena or black/tarry stool.  Fecal Occult Blood, POC(!): POSITIVE [HM]  0437 Lactic has normalized  Lactic Acid, Venous: 1.83 [HM]  0437 BP remains stable and  WNL  BP: 109/90 [HM]  0511 No abnormalities are noted on thoracic films.  Patient does not have midline or paraspinal back pain or tenderness.  No additional imaging at this time.  DG Thoracic Spine 2 View [HM]    Clinical Course User Index [HM] Tavonna Worthington, Dahlia ClientHannah, PA-C    Patient presents emergency department with abdominal pain and suicidal ideation.  On arrival he is tachycardic and hypotensive.  Labs show mild hypokalemia which was replaced here.  Drug screen is positive for benzodiazepine and marijuana.  He is significantly dehydrated with a specific gravity of greater than 1.046.  His lactic acid was elevated.  Suspect large alcohol intake today and heat outside or part of the reason for his severe dehydration.  This is a potential reason for his elevated lactic acid.  He is afebrile.  His tachycardia resolved after fluids.  Patient reports one episode of minor hematemesis yesterday.  His fecal occult is positive however he has numerous large hemorrhoids on DRE.  No melena or black tarry stools to suggest GI bleed.  CT scan of his abdomen is without acute abnormalities.   Chest x-ray without evidence of pneumonia, pneumothorax or pulmonary edema. I personally evaluated these images.  Questionable endplate abnormality on chest x-ray.  Thoracic spine films were ordered.  Patient has no chest pain.  EKG does show minimal ST elevation this however does not meet  STEMI criteria.  This appears unchanged from his previous EKG.  His troponin is normal.  Highly doubt ACS as the cause of his hypotension.  Patient was also making suicidal, homicidal threats towards the police.  He was evaluated by TTS who is requesting overnight observation and reassessment in the morning. At this time there is no emergent medical condition requiring intervention.  If his vitals remain stable he may be dispositioned per TTS.     Final Clinical Impressions(s) / ED Diagnoses   Final diagnoses:  Alcoholic intoxication without complication (HCC)  Alcohol abuse  Dehydration  Suicidal ideation  Elevated lactic acid level  Right upper quadrant abdominal pain    ED Discharge Orders    None       Milta DeitersMuthersbaugh, Kalonji Zurawski, PA-C 05/05/18 [id number]    Sharma Lawrance, Dahlia ClientHannah, PA-C 05/05/18 [id number]    [name], [name], MD 05/05/18 1729

## 2018-05-04 NOTE — ED Triage Notes (Signed)
Pt bib EMS and was picked up a [place] station.  Pt reported SOB.  EMS noted that pt's RR is 18 breathes/min, O2 saturation is 100% RA, and bilateral lung sounds clear.  On arrival to ED, pt expressed SI.  Pt endorsed drinking ETOH this evening and EMS found pt next to a 40oz bottle of ETOH.

## 2018-05-05 ENCOUNTER — Emergency Department (HOSPITAL_COMMUNITY): Payer: Self-pay

## 2018-05-05 LAB — URINALYSIS, ROUTINE W REFLEX MICROSCOPIC
BILIRUBIN URINE: NEGATIVE
Glucose, UA: NEGATIVE mg/dL
Hgb urine dipstick: NEGATIVE
KETONES UR: NEGATIVE mg/dL
Leukocytes, UA: NEGATIVE
NITRITE: NEGATIVE
PH: 5 (ref 5.0–8.0)
Protein, ur: NEGATIVE mg/dL
Specific Gravity, Urine: >1.046 — ABNORMAL HIGH (ref 1.005–1.030)

## 2018-05-05 LAB — LIPASE, BLOOD: Lipase: 63 U/L — ABNORMAL HIGH (ref 11–51)

## 2018-05-05 LAB — RAPID URINE DRUG SCREEN, HOSP PERFORMED
AMPHETAMINES: NOT DETECTED
Barbiturates: NOT DETECTED
Benzodiazepines: POSITIVE — AB
Cocaine: NOT DETECTED
TETRAHYDROCANNABINOL: POSITIVE — AB

## 2018-05-05 LAB — COMPREHENSIVE METABOLIC PANEL
ALBUMIN: 3.7 g/dL (ref 3.5–5.0)
ALT: 382 U/L — ABNORMAL HIGH (ref 0–44)
AST: 470 U/L — AB (ref 15–41)
Alkaline Phosphatase: 115 U/L (ref 38–126)
Anion gap: 12 (ref 5–15)
BUN: 14 mg/dL (ref 6–20)
CO2: 24 mmol/L (ref 22–32)
Calcium: 9.4 mg/dL (ref 8.9–10.3)
Chloride: 105 mmol/L (ref 98–111)
Creatinine, Ser: 1.01 mg/dL (ref 0.61–1.24)
GFR calc Af Amer: >60 mL/min (ref >60)
GFR calc non Af Amer: >60 mL/min (ref >60)
GLUCOSE: 110 mg/dL — AB (ref 70–99)
POTASSIUM: 3.3 mmol/L — AB (ref 3.5–5.1)
SODIUM: 141 mmol/L (ref 135–145)
Total Bilirubin: 0.8 mg/dL (ref 0.3–1.2)
Total Protein: 7.2 g/dL (ref 6.5–8.1)

## 2018-05-05 LAB — TYPE AND SCREEN
ABO/RH(D): A POS
ANTIBODY SCREEN: NEGATIVE

## 2018-05-05 LAB — I-STAT CG4 LACTIC ACID, ED
LACTIC ACID, VENOUS: 2.06 mmol/L — AB (ref 0.5–1.9)
LACTIC ACID, VENOUS: 1.83 mmol/L (ref 0.5–1.9)

## 2018-05-05 LAB — ABO/RH: ABO/RH(D): A POS

## 2018-05-05 LAB — POC OCCULT BLOOD, ED: Fecal Occult Bld: POSITIVE — AB

## 2018-05-05 MED ORDER — SODIUM CHLORIDE 0.9 % IV BOLUS
1000.0000 mL | Freq: Once | INTRAVENOUS | Status: AC
  Administered 2018-05-05: 1000 mL via INTRAVENOUS

## 2018-05-05 MED ORDER — LORAZEPAM 1 MG PO TABS
0.0000 mg | ORAL_TABLET | Freq: Four times a day (QID) | ORAL | Status: DC
  Administered 2018-05-05 (×2): 1 mg via ORAL
  Filled 2018-05-05 (×2): qty 1

## 2018-05-05 MED ORDER — LORAZEPAM 1 MG PO TABS: 0.0000 mg | ORAL_TABLET | Freq: Two times a day (BID) | ORAL | Status: DC

## 2018-05-05 MED ORDER — LORAZEPAM 2 MG/ML IJ SOLN: 0.0000 mg | Freq: Two times a day (BID) | INTRAMUSCULAR | Status: DC

## 2018-05-05 MED ORDER — POTASSIUM CHLORIDE CRYS ER 20 MEQ PO TBCR
40.0000 meq | EXTENDED_RELEASE_TABLET | Freq: Once | ORAL | Status: AC
  Administered 2018-05-05: 40 meq via ORAL
  Filled 2018-05-05: qty 2

## 2018-05-05 MED ORDER — THIAMINE HCL 100 MG/ML IJ SOLN: 100.0000 mg | Freq: Every day | INTRAMUSCULAR | Status: DC

## 2018-05-05 MED ORDER — LORAZEPAM 2 MG/ML IJ SOLN: 0.0000 mg | Freq: Four times a day (QID) | INTRAMUSCULAR | Status: DC

## 2018-05-05 MED ORDER — VITAMIN B-1 100 MG PO TABS
100.0000 mg | ORAL_TABLET | Freq: Every day | ORAL | Status: DC
  Administered 2018-05-05: 100 mg via ORAL
  Filled 2018-05-05: qty 1

## 2018-05-05 NOTE — ED Notes (Signed)
Pt stated, "I asked that doctor for something for pain, and I don't guess they'll give anything to me. Oh well, I guess I'll get some heroin tomorrow."

## 2018-05-05 NOTE — ED Notes (Addendum)
PT REQUESTING TO BE DISCHARGE.  BELONGINGS GIVEN TO PATIENT BY MEGAN RN. PT VERIFIED ALL BELONGINGS RETURNED

## 2018-05-05 NOTE — Patient Outreach (Signed)
ED Peer Support Specialist Patient Intake (Complete at intake & 30-60 Day Follow-up)  Name: [name] [name]  MRN: [id number]  Age: 57 y.o.   Date of Admission: 05/05/2018  Intake: Initial Comments:      Primary Reason Admitted:  is an 57 y.o. male who presents to the ED voluntarily. Per chart review, pt was evaluated in ED earlier this date due to abdominal pain. Pt left the ED and returned several hours later stating he is now suicidal. Pt states when he left the ED he went home to find out that his girlfriend kicked him out of the home. Pt states he is now homeless. Pt states he has several medical issues including COPD, cirrhosis of the liver, and Hep C. Pt states he no longer wants to live this way and he now has thoughts of suicide.    Lab values: Alcohol/ETOH: Positive Positive UDS? No Amphetamines: No Barbiturates: No Benzodiazepines: No Cocaine: No Opiates: No Cannabinoids:    Demographic information: Gender: Male Ethnicity: White Marital Status: Single Insurance Status: Uninsured/Self-pay Ecologist (Work Neurosurgeon, Physicist, medical, etc.: No Lives with: Alone Living situation: Homeless  Reported Patient History: Patient reported health conditions: COPD Patient aware of HIV and hepatitis status: Yes (comment)(Hep C)  In past year, has patient visited ED for any reason? No  Number of ED visits:    Reason(s) for visit:    In past year, has patient been hospitalized for any reason? Yes  Number of hospitalizations: 3  Reason(s) for hospitalization:    In past year, has patient been arrested? No  Number of arrests:    Reason(s) for arrest:    In past year, has patient been incarcerated? No  Number of incarcerations:    Reason(s) for incarceration:    In past year, has patient received medication-assisted treatment? No  In past year, patient received the following treatments:    In past year, has patient received any harm  reduction services? No  Did this include any of the following?    In past year, has patient received care from a mental health provider for diagnosis other than SUD? No  In past year, is this first time patient has overdosed? No  Number of past overdoses:    In past year, is this first time patient has been hospitalized for an overdose? No  Number of hospitalizations for overdose(s):    Is patient currently receiving treatment for a mental health diagnosis? No  Patient reports experiencing difficulty participating in SUD treatment: No    Most important reason(s) for this difficulty?    Has patient received prior services for treatment? No  In past, patient has received services from following agencies:    Plan of Care:  Suggested follow up at these agencies/treatment centers:    Other information: CPSS met with Pt and was able to complete series of questions and gain information to better assist Pt. CPSS discussed a few options that maybe beneficial for Pt. CPSS spoke with Pt to get an consent formed signed and get himself in to a detox facility. CPSS is waiting for reply.    Aaron Edelman Keyanna Sandefer, [place]  05/05/2018 10:37 AM

## 2018-05-05 NOTE — BH Assessment (Addendum)
Assessment Note  [name] GraterMichael [name] is an 57 y.o. male who presents to the ED voluntarily. Pt was recently assessed by this Clinical research associatewriter on a separate encounter. Pt was assessed on 05/03/18 due to alcohol intoxication and SI. Pt was later d/c on 05/04/18 and returned to the ED several hours later. Pt states he came back to the ED because he is still feeling suicidal. Pt appears angry during the assessment as he is yelling at this Clinical research associatewriter and calling the nurse and other ED staff names and cursing during the assessment. Pt states he believes the ED is prejudice against White people because he saw Black people getting help before him. Pt states he is sick and needs help with his liver. Pt reports he wants a bed at an inpt hospital and he is angry because he feels no one is helping him. TTS asked the pt if he has followed up with substance abuse resources and pt stated "that does not help me. I need to detox." Pt says he wants to get a gun and come back to this hospital and shoot the staff. Pt stated "I will turn this mother fucker out. I am tired of being disrespected. I'll go to jail over this shit." Pt continues cursing and yelling during the assessment and demands medication for pain from a different nurse because he does not want his current nurse to come back into his room. Pt states "she came in here waking me up in the middle of my sleep asking me to put my arm up so she can take my blood pressure. Where am I supposed to put my arm?" Pt continues being combative and making threats during the assessment. Pt's current BAL is 239.   Per Donell SievertSpencer Simon, PA pt is recommended for continued observation for safety and to be reassessed in the AM by psych. EDP Muthersbaugh, Boyd KerbsHannah, PA-C and pt's nurse Hardie LoraLilibeth, RN have been advised.   Diagnosis: Substance induced mood disorder; Alcohol use disorder, severe   Past Medical History:  Past Medical History:  Diagnosis Date  . Alcoholic (HCC)   . Anxiety   . COPD (chronic  obstructive pulmonary disease) (HCC)   . Depression   . Hepatitis C   . Homelessness   . Malingering   . Peripheral neuropathy   . Polysubstance abuse [facility](HCC)     Past Surgical History:  Procedure Laterality Date  . HIP SURGERY      Family History: No family history on file.  Social History:  reports that he has been smoking cigarettes. He has a 7.50 pack-year smoking history. He has never used smokeless tobacco. He reports that he drinks alcohol. He reports that he has current or past drug history. Drugs: Cocaine and Marijuana.  Additional Social History:  Alcohol / Drug Use Pain Medications: See MAR Prescriptions: See MAR Over the Counter: See MAR History of alcohol / drug use?: Yes Longest period of sobriety (when/how long): 8 years Negative Consequences of Use: Financial, Legal, Personal relationships, Work / School Withdrawal Symptoms: DTs, Seizures Onset of Seizures: unknown Date of most recent seizure: 2014 Substance #1 Name of Substance 1: Alcohol 1 - Age of First Use: 10 1 - Amount (size/oz): excessive amounts 1 - Frequency: daily 1 - Duration: ongoing 1 - Last Use / Amount: 05/04/18 Substance #2 Name of Substance 2: Cannabis 2 - Age of First Use: 10 2 - Amount (size/oz): varies 2 - Frequency: couple times a week  2 - Duration: ongoing 2 - Last Use /  Amount: 1 week ago  CIWA: CIWA-Ar BP: 109/90 Pulse Rate: 100 Nausea and Vomiting: no nausea and no vomiting Tactile Disturbances: very mild itching, pins and needles, burning or numbness Tremor: not visible, but can be felt fingertip to fingertip Auditory Disturbances: not present Paroxysmal Sweats: no sweat visible Visual Disturbances: not present Anxiety: three Headache, Fullness in Head: none present Agitation: somewhat more than normal activity Orientation and Clouding of Sensorium: oriented and can do serial additions CIWA-Ar Total: 6 COWS:    Allergies:  Allergies  Allergen Reactions  . Fentanyl  Itching  . Hydromorphone Hcl Itching    Pt  States itching all over Pt  States itching all over Pt  States itching all over   . Morphine Itching  . Pantoprazole Hives  . Pantoprazole Sodium Hives  . Nsaids Nausea And Vomiting, Other (See Comments) and Hives    Severe cramping Other reaction(s): Abdominal Pain Cramping per [facility]  . Morphine And Related Itching  . Ibuprofen   . Ketorolac Other (See Comments)  . Pantoprazole Sodium Hives    Home Medications:  (Not in a hospital admission)  OB/GYN Status:  No LMP for male patient.  General Assessment Data Location of Assessment: [facility] TTS Assessment: In system Is this a Tele or Face-to-Face Assessment?: Face-to-Face Is this an Initial Assessment or a Re-assessment for this encounter?: Initial Assessment Marital status: Single Is patient pregnant?: No Pregnancy Status: No Living Arrangements: Other (Comment)(homeless) Can pt return to current living arrangement?: Yes Admission Status: Voluntary Is patient capable of signing voluntary admission?: Yes Referral Source: Self/Family/Friend Insurance type: none     Crisis Care Plan Living Arrangements: Other (Comment)(homeless) Name of Psychiatrist: none Name of Therapist: none  Education Status Is patient currently in school?: No Is the patient employed, unemployed or receiving disability?: Unemployed  Risk to self with the past 6 months Suicidal Ideation: Yes-Currently Present Has patient been a risk to self within the past 6 months prior to admission? : No Suicidal Intent: No Has patient had any suicidal intent within the past 6 months prior to admission? : No Is patient at risk for suicide?: Yes Suicidal Plan?: Yes-Currently Present Has patient had any suicidal plan within the past 6 months prior to admission? : Yes Specify Current Suicidal Plan: pt says he wants to cut his wrists  Access to Means: Yes Specify Access to Suicidal Means: pt says he has access  to knives  What has been your use of drugs/alcohol within the last 12 months?: daily alcohol use  Previous Attempts/Gestures: No Triggers for Past Attempts: None known Intentional Self Injurious Behavior: None Family Suicide History: No Recent stressful life event(s): Recent negative physical changes, Turmoil (Comment)(substance abuse, pain) Persecutory voices/beliefs?: No Depression: Yes Depression Symptoms: Feeling angry/irritable, Insomnia, Fatigue Substance abuse history and/or treatment for substance abuse?: Yes Suicide prevention information given to non-admitted patients: Not applicable  Risk to Others within the past 6 months Homicidal Ideation: Yes-Currently Present Does patient have any lifetime risk of violence toward others beyond the six months prior to admission? : Yes (comment)(pt making threats to ED staff ) Thoughts of Harm to Others: Yes-Currently Present Comment - Thoughts of Harm to Others: pt is yelling at this writer and ED staff saying he wants to get a gun and shoot everyone  Current Homicidal Intent: No Current Homicidal Plan: No Access to Homicidal Means: No History of harm to others?: No Assessment of Violence: On admission Violent Behavior Description: pt combative, aggressive, yelling in the  ED  Does patient have access to weapons?: Yes (Comment)(knives) Criminal Charges Pending?: No Does patient have a court date: No Is patient on probation?: No  Psychosis Hallucinations: None noted Delusions: None noted  Mental Status Report Appearance/Hygiene: Disheveled, Body odor, In scrubs Eye Contact: Good Motor Activity: Agitation Speech: Aggressive, Loud Level of Consciousness: Combative, Irritable Mood: Angry Affect: Threatening Anxiety Level: None Thought Processes: Tangential Judgement: Impaired Orientation: Person, Place, Time, Situation, Appropriate for developmental age Obsessive Compulsive Thoughts/Behaviors: None  Cognitive  Functioning Concentration: Normal Memory: Remote Intact, Recent Intact Is patient IDD: No Is patient DD?: No Insight: Poor Impulse Control: Poor Appetite: Good Have you had any weight changes? : No Change Sleep: Decreased Total Hours of Sleep: 4 Vegetative Symptoms: None  ADLScreening [facility] Assessment Services) Patient's cognitive ability adequate to safely complete daily activities?: Yes Patient able to express need for assistance with ADLs?: Yes Independently performs ADLs?: Yes (appropriate for developmental age)  Prior Inpatient Therapy Prior Inpatient Therapy: Yes Prior Therapy Dates: 2019 and mult others Prior Therapy Facilty/Provider(s): [facility] Reason for Treatment: SI, ALCOHOL DEPENDENCE   Prior Outpatient Therapy Prior Outpatient Therapy: No Does patient have an ACCT team?: No Does patient have Intensive In-House Services?  : No Does patient have [facility] services? : No Does patient have P4CC services?: No  ADL Screening (condition at time of admission) Patient's cognitive ability adequate to safely complete daily activities?: Yes Is the patient deaf or have difficulty hearing?: No Does the patient have difficulty seeing, even when wearing glasses/contacts?: No Does the patient have difficulty concentrating, remembering, or making decisions?: No Patient able to express need for assistance with ADLs?: Yes Does the patient have difficulty dressing or bathing?: No Independently performs ADLs?: Yes (appropriate for developmental age) Does the patient have difficulty walking or climbing stairs?: Yes(pain in feet) Weakness of Legs: Both(states he needs a hip replacement ) Weakness of Arms/Hands: Right  Home Assistive Devices/Equipment Home Assistive Devices/Equipment: None    Abuse/Neglect Assessment (Assessment to be complete while patient is alone) Abuse/Neglect Assessment Can Be Completed: Yes Physical Abuse: Denies Verbal Abuse: Denies Sexual Abuse:  Denies Exploitation of patient/patient's resources: Denies Self-Neglect: Denies     Merchant navy officerAdvance Directives (For Healthcare) Does Patient Have a Medical Advance Directive?: No Would patient like information on creating a medical advance directive?: No - Patient declined    Additional Information 1:1 In Past 12 Months?: No CIRT Risk: Yes Elopement Risk: No Does patient have medical clearance?: Yes     Disposition: Per Donell SievertSpencer Simon, PA pt is recommended for continued observation for safety and to be reassessed in the AM by psych. EDP Muthersbaugh, Boyd KerbsHannah, PA-C and pt's nurse Hardie LoraLilibeth, RN have been advised.  Disposition Initial Assessment Completed for this Encounter: Yes Disposition of Patient: (OVERNIGHT OBS PENDING AM PSYCH ASSESSMENT ) Patient refused recommended treatment: No  On Site Evaluation by:   Reviewed with Physician:    Karolee OhsAquicha R Ashtynn Berke 05/05/2018 3:17 AM

## 2018-05-05 NOTE — Progress Notes (Signed)
Per Donell SievertSpencer Simon, PA pt is recommended for continued observation for safety and to be reassessed in the AM by psych. EDP Muthersbaugh, Boyd KerbsHannah, PA-C and pt's nurse Hardie LoraLilibeth, RN have been advised.   Princess BruinsAquicha Demari Kropp, MSW, LCSW Therapeutic Triage Specialist  [phone]

## 2018-05-05 NOTE — ED Notes (Signed)
[facility] AT BEDSIDE 

## 2018-05-05 NOTE — BH Assessment (Signed)
[facility] Assessment Progress Note  Per [name], DO, this pt does not require psychiatric hospitalization at this time.  [facility] from [facility] with substance abuse treatment referrals.  These have  been included in pt's discharge instructions.  Pt would also benefit from seeing Peer Support Specialists; they will be asked to speak to pt.  Pt's nurse has been notified.  Mack Alvidrez, MA Triage Specialist [phone]     

## 2018-05-05 NOTE — ED Notes (Signed)
[facility] BRYAN ATTEMPTING TO FIND DETOX FACILITY FOR PATIENT. PT ENCOURAGED TO REMAIN UNTIL INFORMATION IS GIVEN. PATIENT CURRENTLY IN BATHROOM. PT THROUGH BATHROOM DOOR, REQUESTING TO SMOKE AND BE DISCHARGED. THIS WRITER CONTINUES TO ADVICE PATIENT TO STAY FOR DETOX INFORMATION FROM [facility]. AWAITING RESPONSE.

## 2018-05-05 NOTE — ED Notes (Signed)
PATIENT SEEN BY [facility] OUTSIDE. THIS WRITER DID NOT SEE PT LEAVE. PT WITHOUT DISTRESS. PT UNABLE TO COMPLETE DISCHARGE INSTRUCTIONS. THIS WRITER SPOKE WITH BRYAN FROM [facility]. HE STATES " HE WOULD BE ABLE TO WORK WITH PT IN THE COMMUNITY IF PATIENT AGREED". PT DECLINED VITAL SIGNS. DISCHARGED WITHOUT EVENT

## 2018-05-05 NOTE — Discharge Instructions (Signed)
To help you maintain a sober lifestyle, a substance abuse treatment program may be beneficial to you.  Contact one of the following facilities at your earliest opportunity to ask about enrolling:  RESIDENTIAL PROGRAMS:       [facility]      [address]      [place], [place] [address]      [phone]       [facility]      [address]      [place], [place] [address]      [phone]  OUTPATIENT PROGRAMS:       [facility]      [address]      [place], [place] [address]      [phone]      New patients are seen at the walk-in clinic every Tuesday from 9:00 am - 12:00 pm

## 2018-05-05 NOTE — [facility] Suicide Risk Assessment (Signed)
Suicide Risk Assessment  Discharge Assessment   [facility]   Principal Problem: Alcohol abuse with alcohol-induced mood disorder [facility](HCC) Discharge Diagnoses:  Patient Active Problem List   Diagnosis Date Noted  . Alcohol abuse with alcohol-induced mood disorder (HCC) [F10.14] 10/26/2014    Priority: High  . Major depressive disorder, recurrent episode, moderate (HCC) [F33.1]   . Substance abuse (HCC) [F19.10]   . Suicidal ideation [R45.851]   . Sprained ankle [S93.409A] 10/28/2014  . Substance or medication-induced bipolar and related disorder with onset during intoxication (HCC) [Z61.096[F19.929, F19.94] 10/26/2014  . Alcohol use disorder, severe, dependence (HCC) [F10.20] 10/26/2014  . Cannabis use disorder, severe, dependence (HCC) [F12.20] 10/26/2014  . 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 Gastrointestinal bleeding, lower [K92.2] 02/14/2014    Total Time spent with patient: 45 minutes  Musculoskeletal: Strength & Muscle Tone: within normal limits Gait & Station: normal Patient leans: N/A  Psychiatric Specialty Exam:   Blood pressure (!) 137/93, pulse 96, temperature 98.3 F (36.8 C), temperature source Oral, resp. rate 15, height 6\' 1"  (1.854 m), weight 65.7 kg, SpO2 96 %.Body mass index is 19.11 kg/m.  General Appearance: Casual  Eye Contact::  Good  Speech:  Normal Rate409  Volume:  Normal  Mood:  Euthymic  Affect:  Congruent  Thought Process:  Coherent and Descriptions of Associations: Intact  Orientation:  Full (Time, Place, and Person)  Thought Content:  WDL and Logical  Suicidal Thoughts:  No  Homicidal Thoughts:  No  Memory:  Immediate;   Good Recent;   Good Remote;   Good  Judgement:  Fair  Insight:  Fair  Psychomotor Activity:  Normal  Concentration:  Good  Recall:  Good  Fund of Knowledge:Fair  Language: Good  Akathisia:  No  Handed:  Right  AIMS (if indicated):     Assets:  Leisure  Time Physical Health Resilience  Sleep:     Cognition: WNL  ADL's:  Intact   Mental Status Per Nursing Assessment::   On Admission:   57 yo male who presented to the ED under the influence of alcohol and agitated.  On assessment with the psych providers, he was calm and cooperative with no suicidal/homicidal ideations, hallucinations, or withdrawal symptoms.  He is interested in going to rehab, Peer support consult placed.  [name] NeedleMichael was just releases from [facility] on [facility]/16 from treatment, stable for discharge.  Demographic Factors:  Male and Caucasian  Loss Factors: NA  Historical Factors: NA  Risk Reduction Factors:   Sense of responsibility to family and Positive social support  Continued Clinical Symptoms:  None  Cognitive Features That Contribute To Risk:  None    Suicide Risk:  Minimal: No identifiable suicidal ideation.  Patients presenting with no risk factors but with morbid ruminations; may be classified as minimal risk based on the severity of the depressive symptoms    Plan Of Care/Follow-up recommendations:  Activity:  as tolerated Diet:  heart healthy diet  [name] Roselle, NP 05/05/2018, 10:44 AM

## 2018-08-18 IMAGING — CT CT ABD-PELV W/ CM
2 of 5 series · 16 of 46 positions shown, 18 images · IV contrast (ISOVUE)
Comparison: Abdominal ultrasound 05/02/2018

CLINICAL DATA: Right-sided abdominal pain.  Hypotension.

EXAM:
CT ABDOMEN AND PELVIS WITH CONTRAST
TECHNIQUE: Multidetector CT imaging of the abdomen and pelvis was performed
using the standard protocol following bolus administration of
intravenous contrast.
CONTRAST:  100mL [id number] IOPAMIDOL ([id number]) INJECTION 61%

[Series 2: axial st · axial · 0.73mm/px · z∈[+852,+1212]mm · 13 of 85 slices shown, 15 images]
[im 7/85  soft-tissue]
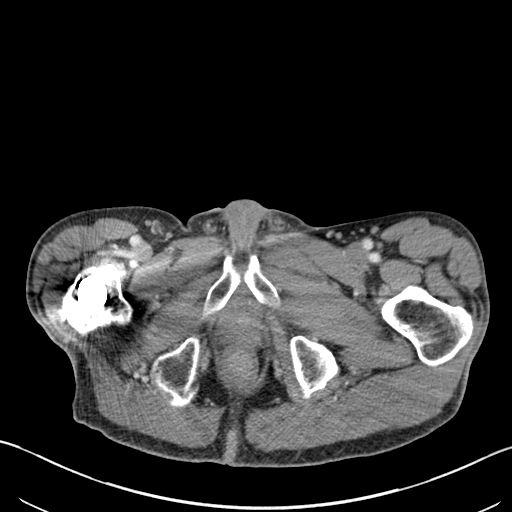
[im 7/85  bone]
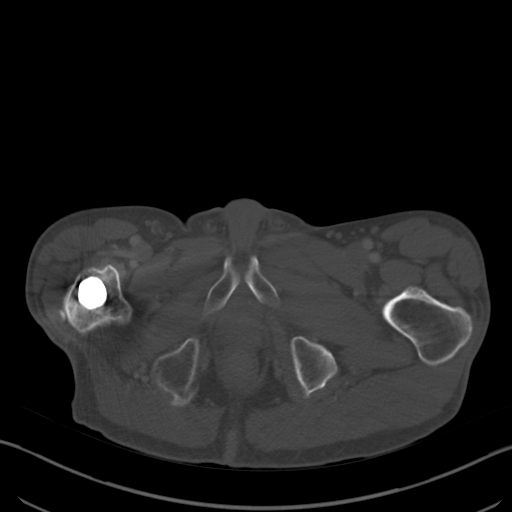
[im 13/85  soft-tissue]
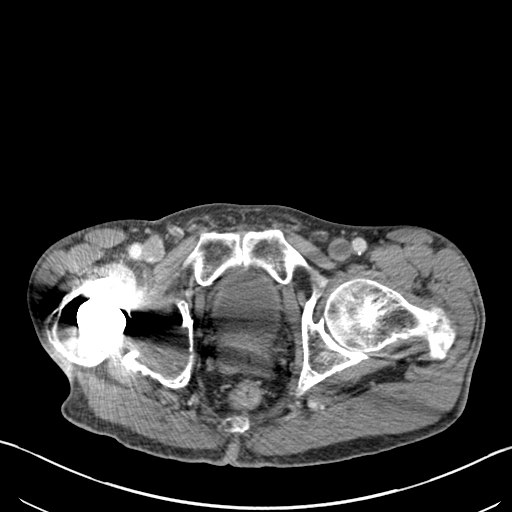
[im 19/85  soft-tissue]
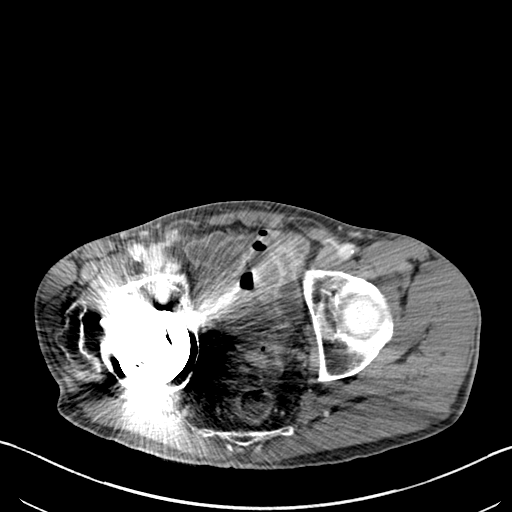
[im 25/85  soft-tissue]
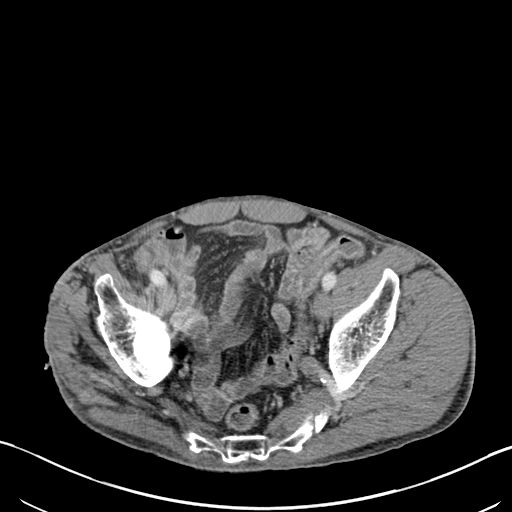
[im 31/85  soft-tissue]
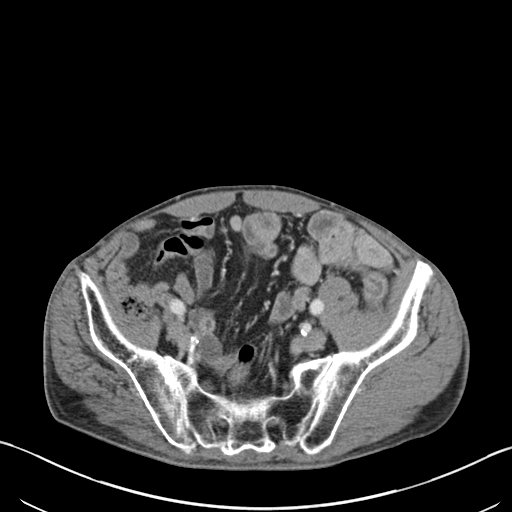
[im 37/85  soft-tissue]
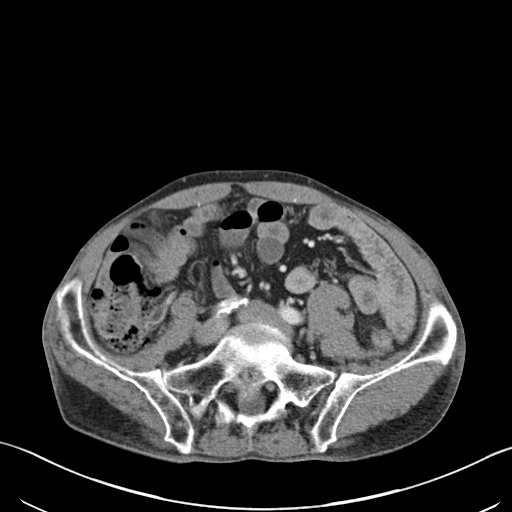
[im 43/85  soft-tissue]
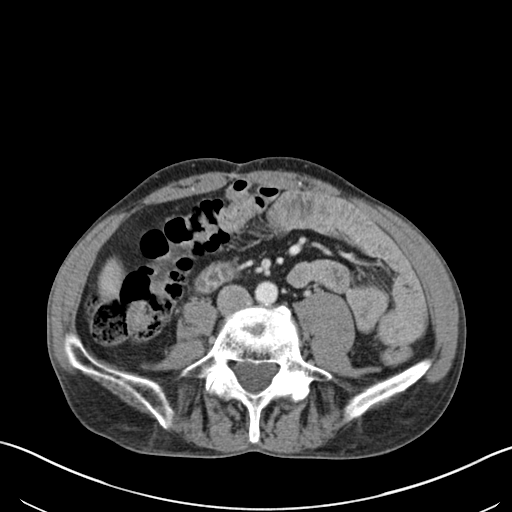
[im 49/85  soft-tissue]
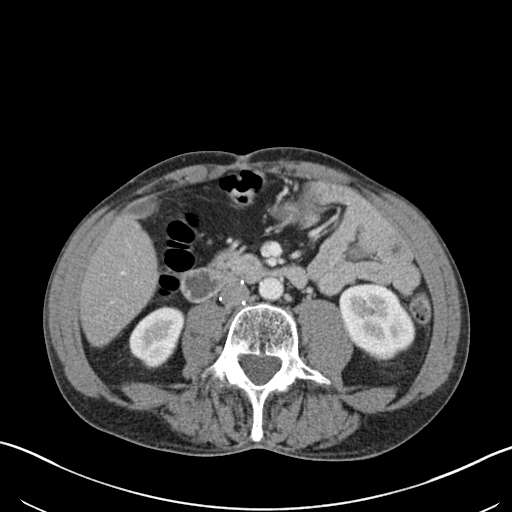
[im 55/85  soft-tissue]
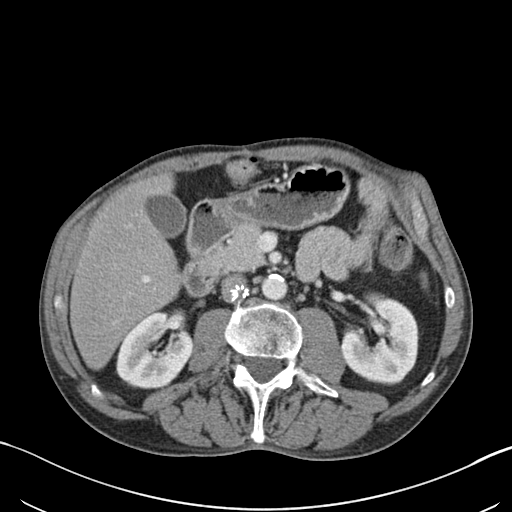
[im 55/85  bone]
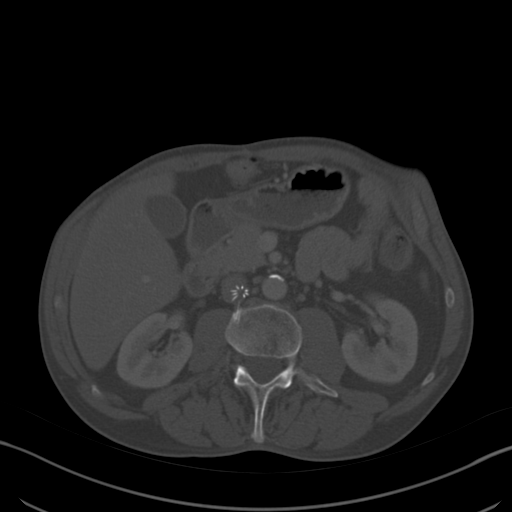
[im 61/85  soft-tissue]
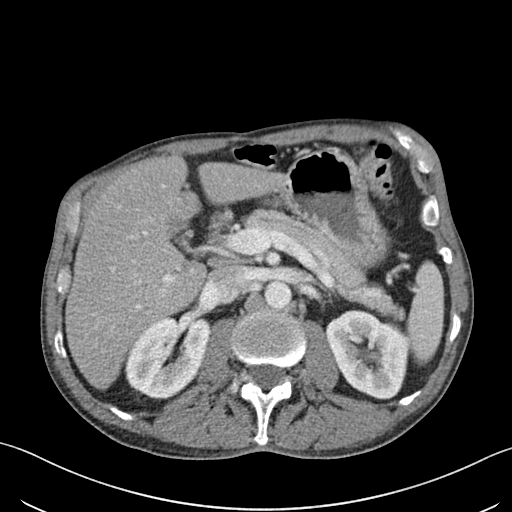
[im 67/85  soft-tissue]
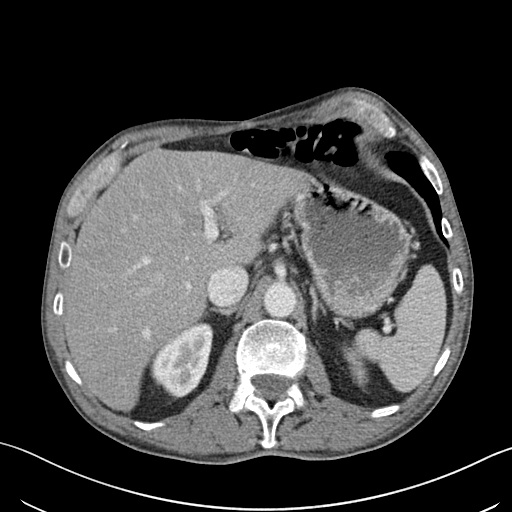
[im 73/85  soft-tissue]
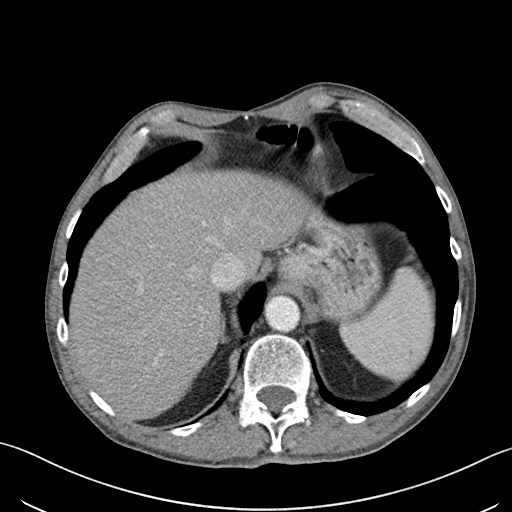
[im 79/85  soft-tissue]
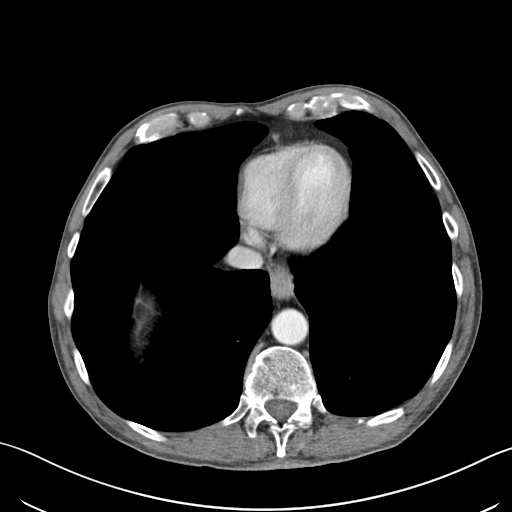

[Series 5: coronal st · coronal · 0.68mm/px · 3 of 92 slices shown]
[im 31/92  soft-tissue]
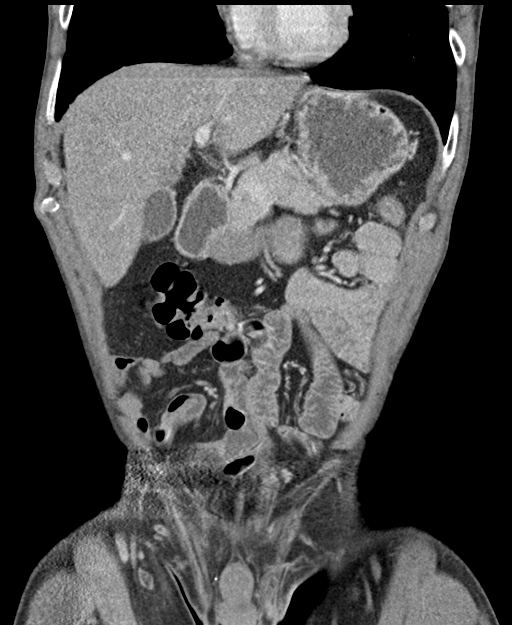
[im 41/92  soft-tissue]
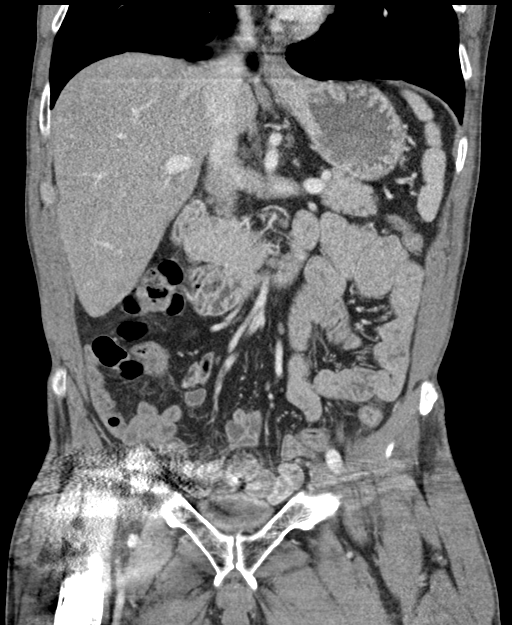
[im 51/92  soft-tissue]
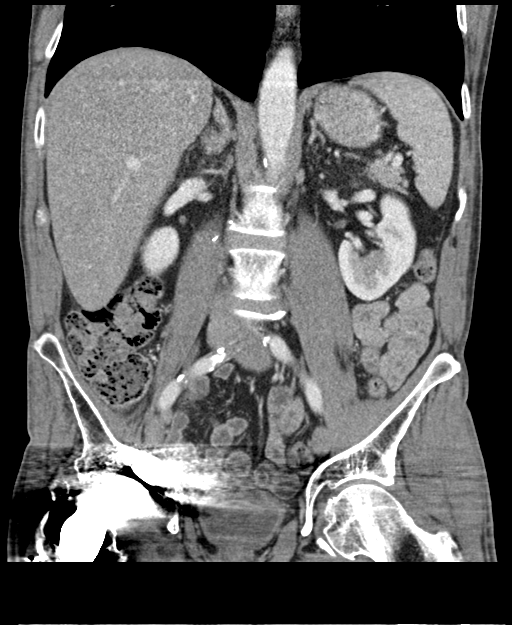

[16 of 46 positions shown; findings below may reference images not displayed]

FINDINGS: Lower chest: No acute abnormality.

Hepatobiliary: No focal liver abnormality is seen. No gallstones,
gallbladder wall thickening, or biliary dilatation.

Pancreas: Unremarkable. No pancreatic ductal dilatation or
surrounding inflammatory changes.

Spleen: Normal in size without focal abnormality.

Adrenals/Urinary Tract: Adrenal glands are unremarkable. Kidneys are
normal, without renal calculi, focal lesion, or hydronephrosis.
Bladder is unremarkable.

Stomach/Bowel: Stomach is within normal limits. No evidence of
appendicitis. No evidence of bowel wall thickening, distention, or
inflammatory changes.

Vascular/Lymphatic: Aortic atherosclerosis. No enlarged abdominal or
pelvic lymph nodes. IVC filter in place.

Reproductive: Prostate is unremarkable.

Other: No abdominal wall hernia or abnormality. No abdominopelvic
ascites.

Musculoskeletal: Mild spondylosis of the lower lumbosacral spine.
Post right hip arthroplasty.
IMPRESSION: No acute abnormality seen within the abdomen or pelvis.

## 2018-09-09 ENCOUNTER — Other Ambulatory Visit: Payer: Self-pay

## 2018-09-09 ENCOUNTER — Emergency Department (HOSPITAL_COMMUNITY)
Admission: EM | Admit: 2018-09-09 | Discharge: 2018-09-10 | Disposition: A | Discharge status: Home / Self Care | Payer: Medicaid Other | Attending: Emergency Medicine | Admitting: Emergency Medicine

## 2018-09-09 ENCOUNTER — Encounter (HOSPITAL_COMMUNITY): Payer: Self-pay

## 2018-09-09 DIAGNOSIS — F332 Major depressive disorder, recurrent severe without psychotic features: Secondary | ICD-10-CM | POA: Diagnosis not present

## 2018-09-09 DIAGNOSIS — R45850 Homicidal ideations: Secondary | ICD-10-CM | POA: Insufficient documentation

## 2018-09-09 DIAGNOSIS — R45851 Suicidal ideations: Secondary | ICD-10-CM | POA: Diagnosis not present

## 2018-09-09 DIAGNOSIS — F1014 Alcohol abuse with alcohol-induced mood disorder: Secondary | ICD-10-CM | POA: Diagnosis present

## 2018-09-09 DIAGNOSIS — R4689 Other symptoms and signs involving appearance and behavior: Secondary | ICD-10-CM | POA: Diagnosis not present

## 2018-09-09 LAB — RAPID URINE DRUG SCREEN, HOSP PERFORMED
Amphetamines: NOT DETECTED
BARBITURATES: NOT DETECTED
Benzodiazepines: POSITIVE — AB
Cocaine: NOT DETECTED
Opiates: POSITIVE — AB
TETRAHYDROCANNABINOL: POSITIVE — AB

## 2018-09-09 LAB — COMPREHENSIVE METABOLIC PANEL
ALK PHOS: 93 U/L (ref 38–126)
ALT: 96 U/L — AB (ref 0–44)
AST: 226 U/L — AB (ref 15–41)
Albumin: 3.8 g/dL (ref 3.5–5.0)
Anion gap: 12 (ref 5–15)
BILIRUBIN TOTAL: 0.5 mg/dL (ref 0.3–1.2)
BUN: 10 mg/dL (ref 6–20)
CALCIUM: 8.5 mg/dL — AB (ref 8.9–10.3)
CO2: 25 mmol/L (ref 22–32)
CREATININE: 0.75 mg/dL (ref 0.61–1.24)
Chloride: 102 mmol/L (ref 98–111)
GFR calc Af Amer: >60 mL/min (ref >60)
Glucose, Bld: 106 mg/dL — ABNORMAL HIGH (ref 70–99)
Potassium: 3.5 mmol/L (ref 3.5–5.1)
Sodium: 139 mmol/L (ref 135–145)
TOTAL PROTEIN: 7.6 g/dL (ref 6.5–8.1)

## 2018-09-09 LAB — CBC
HCT: 38.6 % — ABNORMAL LOW (ref 39.0–52.0)
HEMOGLOBIN: 12.7 g/dL — AB (ref 13.0–17.0)
MCH: 33.6 pg (ref 26.0–34.0)
MCHC: 32.9 g/dL (ref 30.0–36.0)
MCV: 102.1 fL — AB (ref 80.0–100.0)
Platelets: 235 10*3/uL (ref 150–400)
RBC: 3.78 MIL/uL — ABNORMAL LOW (ref 4.22–5.81)
RDW: 16.3 % — AB (ref 11.5–15.5)
WBC: 5.9 10*3/uL (ref 4.0–10.5)
nRBC: 0 % (ref 0.0–0.2)

## 2018-09-09 LAB — SALICYLATE LEVEL: Salicylate Lvl: <7 mg/dL (ref 2.8–30.0)

## 2018-09-09 LAB — AMMONIA: AMMONIA: 30 umol/L (ref 9–35)

## 2018-09-09 LAB — ACETAMINOPHEN LEVEL: Acetaminophen (Tylenol), Serum: <10 ug/mL — ABNORMAL LOW (ref 10–30)

## 2018-09-09 LAB — ETHANOL: ALCOHOL ETHYL (B): 189 mg/dL — AB (ref <10)

## 2018-09-09 MED ORDER — LORAZEPAM 2 MG/ML IJ SOLN: 0.0000 mg | Freq: Four times a day (QID) | INTRAMUSCULAR | Status: DC

## 2018-09-09 MED ORDER — LORAZEPAM 1 MG PO TABS
0.0000 mg | ORAL_TABLET | Freq: Four times a day (QID) | ORAL | Status: DC
  Administered 2018-09-09: 2 mg via ORAL
  Administered 2018-09-10: 1 mg via ORAL
  Filled 2018-09-09: qty 1
  Filled 2018-09-09: qty 2

## 2018-09-09 MED ORDER — VITAMIN B-1 100 MG PO TABS
100.0000 mg | ORAL_TABLET | Freq: Every day | ORAL | Status: DC
  Administered 2018-09-09 – 2018-09-10 (×2): 100 mg via ORAL
  Filled 2018-09-09 (×2): qty 1

## 2018-09-09 MED ORDER — THIAMINE HCL 100 MG/ML IJ SOLN: 100.0000 mg | Freq: Every day | INTRAMUSCULAR | Status: DC

## 2018-09-09 MED ORDER — LORAZEPAM 2 MG/ML IJ SOLN: 0.0000 mg | Freq: Two times a day (BID) | INTRAMUSCULAR | Status: DC

## 2018-09-09 MED ORDER — ONDANSETRON HCL 4 MG PO TABS: 4.0000 mg | ORAL_TABLET | Freq: Three times a day (TID) | ORAL | Status: DC | PRN

## 2018-09-09 MED ORDER — LORAZEPAM 1 MG PO TABS: 0.0000 mg | ORAL_TABLET | Freq: Two times a day (BID) | ORAL | Status: DC

## 2018-09-09 NOTE — ED Notes (Signed)
Patient calm and cooperative, alert and oriented.

## 2018-09-09 NOTE — BH Assessment (Addendum)
[facility]  Per Juanetta BeetsJacqueline Norman, DO, this pt does not require psychiatric hospitalization at this time.  [facility] with referral information for substance abuse treatment providers in his community.  Registration record [place]  Discharge instructions advise pt to follow up with [facility] to find a treatment provider, also noting that their contact also serves as a 24 hour crisis number.  Pt's nurse, Aram BeechamCynthia, has been notified.  Doylene Canninghomas Evanny Ellerbe, MA Triage Specialist [phone]   Addendum:  After further review, it has been determined that this pt is to [facility] for further observation and stabilization.  Aram BeechamCynthia has been notified.  Doylene Canninghomas Shanyla Marconi, [place] Behavioral Health Coordinator [phone]

## 2018-09-09 NOTE — ED Provider Notes (Signed)
[facility]-EMERGENCY DEPT Provider Note   CSN: [id number] Arrival date & time: 09/09/18  0004     History   Chief Complaint Chief Complaint  Patient presents with  . Homicidal    HPI [name] [name] is a 57 y.o. male with history of polysubstance abuse presenting today for homicidal ideation.  Per nursing staff patient [facility] after waving a knife around and requesting to be brought to the hospital.  Patient threatened that if he is brought to [facility] that he would try to kill the staff at the hospital, he requested to [facility].  Of note patient was seen 6 times in the last 8 days at [facility].  Upon my initial evaluation patient sleeping comfortably and in no acute distress.  He is easily arousable however quickly becomes angry upon being asked questions.  Patient denying that he was waving knives around and states that he walked to this emergency department today.  Patient endorses using "every drug "today.  He is uncooperative with history or examination and quickly becomes aggressive.  Patient denies suicidal ideations however did express wanting to kill staff members of another hospital.   HPI  Past Medical History:  Diagnosis Date  . Alcoholic (HCC)   . Anxiety   . COPD (chronic obstructive pulmonary disease) (HCC)   . Depression   . Hepatitis C   . Homelessness   . Malingering   . Peripheral neuropathy   . Polysubstance abuse [facility](HCC)     Patient Active Problem List   Diagnosis Date Noted  . Major depressive disorder, recurrent episode, moderate (HCC)   . Substance abuse (HCC)   . Suicidal ideation   . Sprained ankle 10/28/2014  . Alcohol abuse with alcohol-induced mood disorder (HCC) 10/26/2014  . Substance or medication-induced bipolar and related disorder with onset during intoxication (HCC) 10/26/2014  . Alcohol use disorder, severe, dependence (HCC)  10/26/2014  . Cannabis use disorder, severe, dependence (HCC) 10/26/2014  . Cannabis abuse with cannabis-induced disorder (HCC) 04/11/2014  . Gastrointestinal bleeding, lower 02/14/2014    Past Surgical History:  Procedure Laterality Date  . HIP SURGERY          Home Medications    Prior to Admission medications   Medication Sig Start Date End Date Taking? Authorizing Provider  albuterol (PROVENTIL HFA;VENTOLIN HFA) 108 (90 Base) MCG/ACT inhaler Inhale 1-2 puffs into the lungs every 6 (six) hours as needed for wheezing or shortness of breath.  01/13/18  Yes [provider]  nicotine (NICODERM CQ - DOSED IN MG/24 HOURS) 21 mg/24hr patch Place 1 patch (21 mg total) onto the skin daily. Patient not taking: Reported on 05/03/2018 02/28/15   Beau FannyWithrow, John C, FNP  traZODone (DESYREL) 50 MG tablet Take 1 tablet (50 mg total) by mouth at bedtime and may repeat dose one time if needed. Patient not taking: Reported on 05/03/2018 02/28/15   Beau FannyWithrow, John C, FNP    Family History History reviewed. No pertinent family history.  Social History Social History   Tobacco Use  . Smoking status: Current Every Day Smoker    Packs/day: 0.50    Years: 15.00    Pack years: 7.50    Types: Cigarettes  . Smokeless tobacco: Never Used  Substance Use Topics  . Alcohol use: Yes    Comment: 1/5 daily  . Drug use: Yes    Types: Cocaine, Marijuana    Comment: heroin  Allergies   Fentanyl; Hydromorphone hcl; Morphine; Pantoprazole; Pantoprazole sodium; Nsaids; Morphine and related; Ibuprofen; Ketorolac; and Pantoprazole sodium   Review of Systems Review of Systems  Constitutional: Negative.  Negative for chills and fever.  Eyes: Negative.  Negative for visual disturbance.  Respiratory: Negative.  Negative for cough and shortness of breath.   Cardiovascular: Negative.  Negative for chest pain.  Gastrointestinal: Negative.  Negative for abdominal pain, nausea and vomiting.  Skin:  Negative.  Negative for wound.  Neurological: Negative.  Negative for headaches.  Psychiatric/Behavioral: Positive for suicidal ideas. Negative for hallucinations.  All other systems reviewed and are negative.  Physical Exam Updated Vital Signs BP 115/88 (BP Location: Left Arm)   Pulse (!) 105   Temp 98.9 F (37.2 C) (Oral)   Resp 15   Ht 6\' 1"  (1.854 m)   Wt 70.3 kg   SpO2 98%   BMI 20.45 kg/m   Physical Exam Constitutional:      General: He is not in acute distress.    Appearance: He is well-developed.  HENT:     Head: Normocephalic and atraumatic.     Jaw: There is normal jaw occlusion.     Right Ear: External ear normal.     Left Ear: External ear normal.     Nose: Nose normal.     Mouth/Throat:     Lips: Pink.     Mouth: Mucous membranes are moist.     Pharynx: Oropharynx is clear. Uvula midline.  Eyes:     General: Vision grossly intact. Gaze aligned appropriately.     Extraocular Movements: Extraocular movements intact.     Conjunctiva/sclera: Conjunctivae normal.     Pupils: Pupils are equal, round, and reactive to light.     Comments: Visual fields grossly intact bilaterally  Neck:     Musculoskeletal: Full passive range of motion without pain, normal range of motion and neck supple.     Trachea: Trachea and phonation normal. No tracheal deviation.  Cardiovascular:     Rate and Rhythm: Normal rate and regular rhythm.     Pulses:          Dorsalis pedis pulses are 2+ on the right side and 2+ on the left side.       Posterior tibial pulses are 2+ on the right side and 2+ on the left side.     Heart sounds: Normal heart sounds.  Pulmonary:     Effort: Pulmonary effort is normal. No respiratory distress.     Breath sounds: Normal breath sounds and air entry.  Abdominal:     Palpations: Abdomen is soft.     Tenderness: There is no abdominal tenderness. There is no guarding or rebound.  Musculoskeletal: Normal range of motion.  Skin:    General: Skin is warm  and dry.  Neurological:     Mental Status: He is alert and oriented to person, place, and time.     GCS: GCS eye subscore is 4. GCS verbal subscore is 5. GCS motor subscore is 6.     Comments: Mental Status: Alert, oriented, thought content appropriate, able to give a coherent history. Speech fluent without evidence of aphasia. Able to follow 2 step commands without difficulty. Cranial Nerves: II: Peripheral visual fields grossly normal, pupils equal, round, reactive to light III,IV, VI: ptosis not present, extra-ocular motions intact bilaterally V,VII: smile symmetric, eyebrows raise symmetric, facial light touch sensation equal VIII: hearing grossly normal to voice X: uvula elevates symmetrically XI: bilateral  shoulder shrug symmetric and strong XII: midline tongue extension without fassiculations Motor: Normal tone. 5/5 strength in upper and lower extremities bilaterally including strong and equal grip strength and dorsiflexion/plantar flexion Sensory: Sensation intact to light touch in all extremities.Negative Romberg.  Cerebellar: normal finger-to-nose with bilateral upper extremities. No pronator drift.  Gait: normal gait and balance without assistance  Psychiatric:        Attention and Perception: Attention normal.        Mood and Affect: Affect is labile and angry.        Speech: Speech normal.        Behavior: Behavior is agitated and aggressive.        Thought Content: Thought content includes homicidal and suicidal ideation.        Cognition and Memory: Cognition normal.      ED Treatments / Results  Labs (all labs ordered are listed, but only abnormal results are displayed) Labs Reviewed  COMPREHENSIVE METABOLIC PANEL - Abnormal; Notable for the following components:      Result Value   Glucose, Bld 106 (*)    Calcium 8.5 (*)    AST 226 (*)    ALT 96 (*)    All other components within normal limits  ETHANOL - Abnormal; Notable for the following  components:   Alcohol, Ethyl (B) 189 (*)    All other components within normal limits  ACETAMINOPHEN LEVEL - Abnormal; Notable for the following components:   Acetaminophen (Tylenol), Serum <10 (*)    All other components within normal limits  CBC - Abnormal; Notable for the following components:   RBC 3.78 (*)    Hemoglobin 12.7 (*)    HCT 38.6 (*)    MCV 102.1 (*)    RDW 16.3 (*)    All other components within normal limits  RAPID URINE DRUG SCREEN, HOSP PERFORMED - Abnormal; Notable for the following components:   Opiates POSITIVE (*)    Benzodiazepines POSITIVE (*)    Tetrahydrocannabinol POSITIVE (*)    All other components within normal limits  SALICYLATE LEVEL    EKG None  Radiology No results found.  Procedures Procedures (including critical care time)  Medications Ordered in ED Medications - No data to display   Initial Impression / Assessment and Plan / ED Course  I have reviewed the triage vital signs and the nursing notes.  Pertinent labs & imaging results that were available during my care of the patient were reviewed by me and considered in my medical decision making (see chart for details).    57 year old male with history of polysubstance abuse presenting today for homicidal ideations and death threats.  He is completely uncooperative with examination today and is aggressive.  Patient denies any physical complaints and states that he is feeling well aside from his anger.  Patient cursing at nursing staff and refusing evaluation.  ------------------------------------------- Patient cooperative with nursing staff and allowed blood work to be performed. Patient sleeping comfortably and in no acute distress.  Tylenol level negative Ethanol 189, appears patient's baseline, CIWA in place UDS positive for opiates, benzodiazepines and THC CBC appears at patient's baseline Salicylate level negative CMP with elevated LFTs, improved from patient's  baseline --------------------------------------------------- Patient reassessed, he is now cooperative with examination. Physical examination unremarkable.  No focal neuro deficits.  Patient without focal area of pain, no chest pain or shortness of breath.  Gait is steady without assistance.  Patient still endorsing both homicidal and suicidal ideations.  At this  time there does not appear to be any evidence of an acute emergency medical condition and the patient medically cleared at this time for psychiatric evaluation.  Patient's case discussed with Dr. Preston Fleeting who agrees with plan to discharge with follow-up.    Note: Portions of this report may have been transcribed using voice recognition software. Every effort was made to ensure accuracy; however, inadvertent computerized transcription errors may still be present. Final Clinical Impressions(s) / ED Diagnoses   Final diagnoses:  Homicidal ideation  Aggressive behavior  Suicidal ideations    ED Discharge Orders    None       Elizabeth Palau 09/09/18 0731    Dione Booze, MD 09/09/18 [phone]

## 2018-09-09 NOTE — ED Notes (Signed)
Pt has been sleeping since arrival 

## 2018-09-09 NOTE — BH Assessment (Addendum)
[facility] Assessment Progress Note  Case was staffed with Sharma CovertNorman DO who recommended patient be monitored and observed for safety.

## 2018-09-09 NOTE — ED Notes (Signed)
Bed: WTR7 Expected date:  Expected time:  Means of arrival:  Comments: 

## 2018-09-09 NOTE — ED Notes (Signed)
Pt refused blood work Pt stated, "I ain't giving you no fucking blood. Leave me alone." ED Pa informed

## 2018-09-09 NOTE — Progress Notes (Signed)
Received [name] [name] from the main ED with security. He is alert and calm. He endorsed feeling homicidal and suicidal, stating it does not take much for him to "go off, because I have bipolar. He is resting in his bed.

## 2018-09-09 NOTE — ED Notes (Signed)
MD notified that patient is having tremors.

## 2018-09-09 NOTE — Discharge Instructions (Signed)
To help you maintain a sober lifestyle, a substance abuse treatment program may be beneficial to you.  To find a treatment provider in your community, contact [facility] at your earliest opportunity.  Please note that this number also serves as a 24 hour crisis number:       [facility]      [phone]

## 2018-09-09 NOTE — BH Assessment (Addendum)
Assessment Note  [name] [name] is an 57 y.o. male that presents this date requesting assistance with continued substance abuse issues and S/I. Patient also reports ongoing H/I. Patient denies any AVH. Patient when questioned in reference to earlier note stating he voiced H/I and S/I on admission reported he "has a plan to cut himself with a knife "and "kill those people at [facility] who won't help him."  Patient's UDS is positive for Opiates, Benzodiazepines and THC. Patient's BAL was 189 on admission. Patient reports daily alcohol use to include varied amounts stating "I drink what I can get." Patient states he "snorted Heroin" and "did a Valium" prior to admission although does not recall amount and renders limited history in reference to patterns. Patient does report current withdrawals to include tremors and agitation. Patient states he was diagnosed with depression "years ago" although cannot recall time frame or provider who initially diagnosed him with disorder. Patient denies any current medication interventions to assist with symptom management which includes worsening depression over the last two weeks. Patient states since he is homeless and the weather has gotten colder he feels "helpless and worthless." Per chart review patient was last [facility] when he at that time presented with S/I and excessive SA use. Patient was referred to OP services for follow up care although states this date when questioned, "None of that works for me." Per notes, patient has a history of alcoholism, anxiety, COPD, depression, hepatitis C, homelessness, malingering, neuropathy, polysubstance abuse and recurrent suicidal ideation. Patient presents this date with a history of polysubstance abuse and homicidal ideation. Per nursing staff patient [facility] after waving a knife around and requesting to be brought to the hospital. Patient threatened that if he is brought to [facility] that he would try to kill the staff at the hospital, he [facility] (09/09/18 0004). Patient when assessed at 1030 states he does not recall that incident although reports ongoing S/I and H/I. Per notes patient was seen 6 times in the last 8 days at [facility]. Patient denying that he was waving knives around and states that he walked to this emergency department today. Patient endorses using "every drug today". He is uncooperative with history or examination and quickly becomes aggressive. Patient denies suicidal ideations however did express wanting to kill staff members of another hospital. Case was staffed with Sharma CovertNorman DO who recommended patient be monitored and observed for safety.   Diagnosis: F33.2 MDD recurrent severe without psychotic features, Polysubstance abuse     Past Medical History:  Past Medical History:  Diagnosis Date  . Alcoholic (HCC)   . Anxiety   . COPD (chronic obstructive pulmonary disease) (HCC)   . Depression   . Hepatitis C   . Homelessness   . Malingering   . Peripheral neuropathy   . Polysubstance abuse [facility](HCC)     Past Surgical History:  Procedure Laterality Date  . HIP SURGERY      Family History: History reviewed. No pertinent family history.  Social History:  reports that he has been smoking cigarettes. He has a 7.50 pack-year smoking history. He has never used smokeless tobacco. He reports current alcohol use. He reports current drug use. Drugs: Cocaine and Marijuana.  Additional Social History:  Alcohol / Drug Use Pain Medications: See MAR Prescriptions: See MAR Over the Counter: See MAR History of alcohol / drug use?: Yes Longest  period of sobriety (when/how long): 8 years Negative Consequences of Use: Financial, Personal relationships Withdrawal Symptoms: (Denies) Substance #1 Name of Substance 1: Alcohol 1 - Age of First Use: Unknown 1 - Amount (size/oz): Varies 1 -  Frequency: Varies 1 - Duration: Pt states over 20 years 1 - Last Use / Amount: 09/09/18 BAL 189 on admssion Pt reports drinking 1/5th of liquor   CIWA: CIWA-Ar BP: 115/88 Pulse Rate: (!) 105 COWS:    Allergies:  Allergies  Allergen Reactions  . Fentanyl Itching  . Hydromorphone Hcl Itching    Pt  States itching all over Pt  States itching all over Pt  States itching all over   . Morphine Itching  . Pantoprazole Hives  . Pantoprazole Sodium Hives  . Nsaids Nausea And Vomiting, Other (See Comments) and Hives    Severe cramping Other reaction(s): Abdominal Pain [facility]  . Morphine And Related Itching  . Ibuprofen   . Ketorolac Other (See Comments)  . Pantoprazole Sodium Hives    Home Medications: (Not in a hospital admission)   OB/GYN Status:  No LMP for male patient.  General Assessment Data Assessment unable to be completed: Yes Reason for not completing assessment: Cannot locate Telemed cart. Will call when it is located.  Location of Assessment: [facility] ED TTS Assessment: In system Is this a Tele or Face-to-Face Assessment?: Face-to-Face Is this an Initial Assessment or a Re-assessment for this encounter?: Initial Assessment Patient Accompanied by:: N/A Language Other than English: No Living Arrangements: Homeless/Shelter What gender do you identify as?: Male Marital status: Single Living Arrangements: Alone Can pt return to current living arrangement?: Yes Admission Status: Voluntary Is patient capable of signing voluntary admission?: Yes Referral Source: Self/Family/Friend Insurance type: Self Pay  Medical Screening Exam [facility] Walk-in ONLY) Medical Exam completed: Yes  Crisis Care Plan Living Arrangements: Alone Legal Guardian: (NA) Name of Psychiatrist: None Name of Therapist: None  Education Status Is patient currently in school?: No Is the patient employed, unemployed or receiving disability?: Unemployed  Risk to self with the past  6 months Suicidal Ideation: No Has patient been a risk to self within the past 6 months prior to admission? : Yes Suicidal Intent: No Has patient had any suicidal intent within the past 6 months prior to admission? : Yes Is patient at risk for suicide?: Yes Suicidal Plan?: No Has patient had any suicidal plan within the past 6 months prior to admission? : Yes Access to Means: No What has been your use of drugs/alcohol within the last 12 months?: Ongoing Previous Attempts/Gestures: Yes How many times?: 1 Other Self Harm Risks: (Excessive SA use) Triggers for Past Attempts: Unknown Intentional Self Injurious Behavior: None Family Suicide History: No Recent stressful life event(s): Other (Comment)(Homeless, Excessive SA use) Persecutory voices/beliefs?: No Depression: Yes Depression Symptoms: Feeling worthless/self pity Substance abuse history and/or treatment for substance abuse?: No Suicide prevention information given to non-admitted patients: Not applicable  Risk to Others within the past 6 months Homicidal Ideation: No Does patient have any lifetime risk of violence toward others beyond the six months prior to admission? : No Thoughts of Harm to Others: No Current Homicidal Intent: No Current Homicidal Plan: No Access to Homicidal Means: No Identified Victim: NA History of harm to others?: No Assessment of Violence: None Noted Violent Behavior Description: NA Does patient have access to weapons?: No Criminal Charges Pending?: No Does patient have a court date: No Is patient on probation?: No  Psychosis Hallucinations:  None noted Delusions: None noted  Mental Status Report Appearance/Hygiene: In scrubs Eye Contact: Poor Motor Activity: Freedom of movement Speech: Soft, Slow Level of Consciousness: Drowsy Mood: Depressed Affect: Appropriate to circumstance Anxiety Level: Minimal Thought Processes: Coherent, Relevant Judgement: Impaired Orientation: Person, Place,  Time Obsessive Compulsive Thoughts/Behaviors: None  Cognitive Functioning Concentration: Decreased Memory: Recent Intact, Remote Intact Is patient IDD: No Insight: Fair Impulse Control: Poor Appetite: Good Have you had any weight changes? : No Change Sleep: No Change Total Hours of Sleep: 7 Vegetative Symptoms: None  ADLScreening [facility] Assessment Services) Patient's cognitive ability adequate to safely complete daily activities?: Yes Patient able to express need for assistance with ADLs?: Yes Independently performs ADLs?: Yes (appropriate for developmental age)  Prior Inpatient Therapy Prior Inpatient Therapy: Yes Prior Therapy Dates: 2019,2018 Prior Therapy Facilty/Provider(s): [facility] Reason for Treatment: MH issues  Prior Outpatient Therapy Prior Outpatient Therapy: No Does patient have an ACCT team?: No Does patient have Intensive In-House Services?  : No Does patient have [facility] services? : No Does patient have P4CC services?: No  ADL Screening (condition at time of admission) Patient's cognitive ability adequate to safely complete daily activities?: Yes Is the patient deaf or have difficulty hearing?: No Does the patient have difficulty seeing, even when wearing glasses/contacts?: No Does the patient have difficulty concentrating, remembering, or making decisions?: No Patient able to express need for assistance with ADLs?: Yes Does the patient have difficulty dressing or bathing?: No Independently performs ADLs?: Yes (appropriate for developmental age) Does the patient have difficulty walking or climbing stairs?: No Weakness of Legs: None Weakness of Arms/Hands: None  Home Assistive Devices/Equipment Home Assistive Devices/Equipment: None  Therapy Consults (therapy consults require a physician order) PT Evaluation Needed: No OT Evalulation Needed: No SLP Evaluation Needed: No Abuse/Neglect Assessment (Assessment to be complete while patient is  alone) Physical Abuse: Denies Verbal Abuse: Denies Sexual Abuse: Denies Exploitation of patient/patient's resources: Denies Self-Neglect: Denies Values / Beliefs Cultural Requests During Hospitalization: None Spiritual Requests During Hospitalization: None Consults Spiritual Care Consult Needed: No Social Work Consult Needed: No Merchant navy officer (For Healthcare) Does Patient Have a Medical Advance Directive?: No Would patient like information on creating a medical advance directive?: No - Patient declined          Disposition:  Case was staffed with Sharma Covert DO who recommended patient be monitored and observed for safety.   Disposition Initial Assessment Completed for this Encounter: Yes Disposition of Patient: Discharge Patient refused recommended treatment: No Mode of transportation if patient is discharged/movement?: (Unk) Patient referred to: Outpatient clinic referral  On Site Evaluation by:   Reviewed with Physician:    Alfredia Ferguson 09/09/2018 10:54 AM

## 2018-09-09 NOTE — ED Triage Notes (Signed)
Pt was brought here after waving a knife around in [facility]. He threatened to kill staff if he was taken to [facility]. Endorses about a pint of vodka tonight. Brought a bag of knives that is locked up with security.

## 2018-09-10 DIAGNOSIS — F1014 Alcohol abuse with alcohol-induced mood disorder: Secondary | ICD-10-CM

## 2018-09-10 MED ORDER — GABAPENTIN 300 MG PO CAPS: 300.0000 mg | ORAL_CAPSULE | Freq: Three times a day (TID) | ORAL | 0 refills | Status: DC

## 2018-09-10 MED ORDER — GABAPENTIN 300 MG PO CAPS
300.0000 mg | ORAL_CAPSULE | Freq: Three times a day (TID) | ORAL | Status: DC
  Administered 2018-09-10: 300 mg via ORAL
  Filled 2018-09-10: qty 1

## 2018-09-10 NOTE — ED Notes (Addendum)
Attempted to review dc instructions and prescription with pt.  Pt declined and reported he would read the information later.  Pt encouraged to take his medications as directed and follow up with his OP provider.  CSW has provided cab voucher for the pt.  Pt ambulatory with out difficulty to dc area with mHt, belongings returned after leaving the unit.

## 2018-09-10 NOTE — Consult Note (Addendum)
[facility]  09/10/2018 11:40 AM Seward GraterMichael Schussler  MRN:  [id number] Principal Problem: Alcohol abuse with alcohol-induced mood disorder [facility](HCC) Discharge Diagnoses: Principal Problem:   Alcohol abuse with alcohol-induced mood disorder [facility](HCC)  Subjective: 57 yo male who presented to the ED with alcohol intoxication and suicidal ideations.  He was placed on the Ativan alcohol detox protocol and Peer Support Consult placed.  No suicidal/homicidal ideations, hallucinations, and withdrawal symptoms.  Stable for discharge with encouragement to go to use his outpatient resources.    Total Time spent with patient: 45 minutes  Past Psychiatric History: alcohol dependence, depression  Past Medical History:  Past Medical History:  Diagnosis Date  . Alcoholic (HCC)   . Anxiety   . COPD (chronic obstructive pulmonary disease) (HCC)   . Depression   . Hepatitis C   . Homelessness   . Malingering   . Peripheral neuropathy   . Polysubstance abuse [facility](HCC)     Past Surgical History:  Procedure Laterality Date  . HIP SURGERY     Family History: History reviewed. No pertinent family history. Family Psychiatric  History: none Social History:  Social History   Substance and Sexual Activity  Alcohol Use Yes   Comment: 1/5 daily     Social History   Substance and Sexual Activity  Drug Use Yes  . Types: Cocaine, Marijuana   Comment: heroin    Social History   Socioeconomic History  . Marital status: Single    Spouse name: Not on file  . Number of children: Not on file  . Years of education: Not on file  . Highest education level: Not on file  Occupational History  . Not on file  Social Needs  . Financial resource strain: Not on file  . Food insecurity:    Worry: Not on file    Inability: Not on file  . Transportation needs:    Medical: Not on file    Non-medical: Not on file  Tobacco Use  . Smoking status: Current Every Day Smoker    Packs/day: 0.50    Years: 15.00     Pack years: 7.50    Types: Cigarettes  . Smokeless tobacco: Never Used  Substance and Sexual Activity  . Alcohol use: Yes    Comment: 1/5 daily  . Drug use: Yes    Types: Cocaine, Marijuana    Comment: heroin  . Sexual activity: Never  Lifestyle  . Physical activity:    Days per week: Not on file    Minutes per session: Not on file  . Stress: Not on file  Relationships  . Social connections:    Talks on phone: Not on file    Gets together: Not on file    Attends religious service: Not on file    Active member of club or organization: Not on file    Attends meetings of clubs or organizations: Not on file    Relationship status: Not on file  Other Topics Concern  . Not on file  Social History Narrative  . Not on file    Has this patient used any form of tobacco in the last 30 days? (Cigarettes, Smokeless Tobacco, Cigars, and/or Pipes) A prescription for an FDA-approved tobacco cessation medication was offered at discharge and the patient refused  Current Medications: Current Facility-Administered Medications  Medication Dose Route Frequency Provider Last Rate Last Dose  . gabapentin (NEURONTIN) capsule 300 mg  300 mg Oral TID Charm RingsLord, Jamison Y, NP   300  mg at 09/10/18 1008  . LORazepam (ATIVAN) injection 0-4 mg  0-4 mg Intravenous Q6H Starkes-Perry, Juel Burrow, FNP       Or  . LORazepam (ATIVAN) tablet 0-4 mg  0-4 mg Oral Q6H Maryagnes Amos, FNP   1 mg at 09/10/18 1007  . [START ON 09/11/2018] LORazepam (ATIVAN) injection 0-4 mg  0-4 mg Intravenous Q12H Starkes-Perry, Juel Burrow, FNP       Or  . Melene Muller ON 09/11/2018] LORazepam (ATIVAN) tablet 0-4 mg  0-4 mg Oral Q12H Starkes-Perry, Juel Burrow, FNP      . ondansetron (ZOFRAN) tablet 4 mg  4 mg Oral Q8H PRN Starkes-Perry, Juel Burrow, FNP      . thiamine (VITAMIN B-1) tablet 100 mg  100 mg Oral Daily Maryagnes Amos, FNP   100 mg at 09/10/18 1007   Or  . thiamine (B-1) injection 100 mg  100 mg Intravenous Daily Maryagnes Amos, FNP       Current Outpatient Medications  Medication Sig Dispense Refill  . albuterol (PROVENTIL HFA;VENTOLIN HFA) 108 (90 Base) MCG/ACT inhaler Inhale 1-2 puffs into the lungs every 6 (six) hours as needed for wheezing or shortness of breath.     . nicotine (NICODERM CQ - DOSED IN MG/24 HOURS) 21 mg/24hr patch Place 1 patch (21 mg total) onto the skin daily. (Patient not taking: Reported on 05/03/2018) 28 patch 0  . traZODone (DESYREL) 50 MG tablet Take 1 tablet (50 mg total) by mouth at bedtime and may repeat dose one time if needed. (Patient not taking: Reported on 05/03/2018) 60 tablet 0   PTA Medications: (Not in a hospital admission)   Musculoskeletal: Strength & Muscle Tone: within normal limits Gait & Station: normal Patient leans: N/A  Psychiatric Specialty Exam: Physical Exam  Nursing note and vitals reviewed. Constitutional: He is oriented to person, place, and time. He appears well-developed.  HENT:  Head: Normocephalic.  Neck: Normal range of motion.  Respiratory: Effort normal.  Musculoskeletal: Normal range of motion.  Neurological: He is alert and oriented to person, place, and time.  Psychiatric: His speech is normal and behavior is normal. Judgment and thought content normal. His mood appears anxious. Cognition and memory are normal.    Review of Systems  Psychiatric/Behavioral: The patient is nervous/anxious.   All other systems reviewed and are negative.   Blood pressure (!) 137/102, pulse 99, temperature 98.6 F (37 C), temperature source Oral, resp. rate 19, height 6\' 1"  (1.854 m), weight 70.3 kg, SpO2 96 %.Body mass index is 20.45 kg/m.  General Appearance: Casual  Eye Contact:  Good  Speech:  Normal Rate  Volume:  Normal  Mood:  Anxious, mild  Affect:  Congruent  Thought Process:  Coherent and Descriptions of Associations: Intact  Orientation:  Full (Time, Place, and Person)  Thought Content:  WDL and Logical  Suicidal Thoughts:  No   Homicidal Thoughts:  No  Memory:  Immediate;   Good Recent;   Good Remote;   Good  Judgement:  Fair  Insight:  Good  Psychomotor Activity:  Normal  Concentration:  Concentration: Good and Attention Span: Good  Recall:  Good  Fund of Knowledge:  Good  Language:  Good  Akathisia:  No  Handed:  Right  AIMS (if indicated):   N/A  Assets:  Housing Leisure Time Physical Health Resilience Social Support  ADL's:  Intact  Cognition:  WNL  Sleep:   N/A     Demographic Factors:  Male  and Caucasian  Loss Factors: NA  Historical Factors: NA  Risk Reduction Factors:   Sense of responsibility to family and Positive social support  Continued Clinical Symptoms:  Anxiety, mild  Cognitive Features That Contribute To Risk:  None    Suicide Risk:  Minimal: No identifiable suicidal ideation.  Patients presenting with no risk factors but with morbid ruminations; may be classified as minimal risk based on the severity of the depressive symptoms   Plan Of Care/Follow-up recommendations:  Alcohol abuse with alcohol induced mood disorder: -Ativan alcohol detox protocol started -Gabapentin 300 mg TID for withdrawal symptoms  Activity:  as tolerated Diet:  heart healthy diet  Disposition: discharge home Nanine MeansLORD, JAMISON, NP 09/10/2018, 11:40 AM   Patient seen face-to-face for psychiatric evaluation, chart reviewed and case discussed with the physician extender and developed treatment plan. Reviewed the information documented and agree with the treatment plan.  Juanetta BeetsJacqueline Sharetha Newson, DO 09/10/18 1:43 PM

## 2018-09-10 NOTE — BH Assessment (Signed)
[facility]  Per Juanetta BeetsJacqueline Norman, DO, this pt does not require psychiatric hospitalization at this time.  [facility] referral information for substance abuse treatment providers in his community.  Registration record [place]  Discharge instructions advise pt to follow up with [facility] to find a treatment provider, also noting that their contact also serves as a 24 hour crisis number.  Pt's nurse, Diane, has been notified.  [name] Canninghomas Naseem Adler, MA Triage Specialist [phone]

## 2018-09-10 NOTE — ED Notes (Signed)
Pt reports feeling bad from withdrawal symptoms.

## 2018-09-10 NOTE — ED Notes (Addendum)
Pt is from out of town and does not have transportation.  Pt reports that he has a place to stay on [address] in [place], but is unable to give exact  Address.  CSW contacted and is trying to assist with transportation.  Pt is aware.

## 2018-09-10 NOTE — ED Notes (Signed)
Catha NottinghamJamison DNP contacted , pt cleared for dc

## 2018-10-23 ENCOUNTER — Encounter (HOSPITAL_BASED_OUTPATIENT_CLINIC_OR_DEPARTMENT_OTHER): Payer: Self-pay | Admitting: Emergency Medicine

## 2018-10-23 ENCOUNTER — Emergency Department (HOSPITAL_BASED_OUTPATIENT_CLINIC_OR_DEPARTMENT_OTHER)
Admission: EM | Admit: 2018-10-23 | Discharge: 2018-10-24 | Disposition: A | Discharge status: Home / Self Care | Payer: Medicaid Other | Attending: Emergency Medicine | Admitting: Emergency Medicine

## 2018-10-23 ENCOUNTER — Emergency Department (HOSPITAL_BASED_OUTPATIENT_CLINIC_OR_DEPARTMENT_OTHER): Payer: Medicaid Other

## 2018-10-23 ENCOUNTER — Other Ambulatory Visit: Payer: Self-pay

## 2018-10-23 DIAGNOSIS — J449 Chronic obstructive pulmonary disease, unspecified: Secondary | ICD-10-CM | POA: Insufficient documentation

## 2018-10-23 DIAGNOSIS — M7989 Other specified soft tissue disorders: Secondary | ICD-10-CM | POA: Diagnosis not present

## 2018-10-23 DIAGNOSIS — G8929 Other chronic pain: Secondary | ICD-10-CM | POA: Diagnosis not present

## 2018-10-23 DIAGNOSIS — F17210 Nicotine dependence, cigarettes, uncomplicated: Secondary | ICD-10-CM | POA: Diagnosis not present

## 2018-10-23 DIAGNOSIS — Z79899 Other long term (current) drug therapy: Secondary | ICD-10-CM | POA: Insufficient documentation

## 2018-10-23 DIAGNOSIS — F332 Major depressive disorder, recurrent severe without psychotic features: Secondary | ICD-10-CM | POA: Insufficient documentation

## 2018-10-23 DIAGNOSIS — F329 Major depressive disorder, single episode, unspecified: Secondary | ICD-10-CM | POA: Diagnosis present

## 2018-10-23 DIAGNOSIS — F1020 Alcohol dependence, uncomplicated: Secondary | ICD-10-CM | POA: Insufficient documentation

## 2018-10-23 DIAGNOSIS — R45851 Suicidal ideations: Secondary | ICD-10-CM

## 2018-10-23 DIAGNOSIS — R079 Chest pain, unspecified: Secondary | ICD-10-CM | POA: Insufficient documentation

## 2018-10-23 DIAGNOSIS — F1910 Other psychoactive substance abuse, uncomplicated: Secondary | ICD-10-CM | POA: Insufficient documentation

## 2018-10-23 HISTORY — DX: Personal history of other venous thrombosis and embolism: Z86.718

## 2018-10-23 LAB — TROPONIN I: Troponin I: <0.03 ng/mL (ref <0.03)

## 2018-10-23 MED ORDER — ENOXAPARIN SODIUM 40 MG/0.4ML ~~LOC~~ SOLN
40.00 | SUBCUTANEOUS | Status: DC
Start: 2018-10-22 — End: 2018-10-23

## 2018-10-23 MED ORDER — LORAZEPAM 1 MG PO TABS
2.00 | ORAL_TABLET | ORAL | Status: DC
Start: ? — End: 2018-10-23

## 2018-10-23 MED ORDER — SODIUM CHLORIDE FLUSH 0.9 % IV SOLN
5.00 | INTRAVENOUS | Status: DC
Start: ? — End: 2018-10-23

## 2018-10-23 MED ORDER — NICOTINE 21 MG/24HR TD PT24
1.00 | MEDICATED_PATCH | TRANSDERMAL | Status: DC
Start: 2018-10-23 — End: 2018-10-23

## 2018-10-23 MED ORDER — FOLIC ACID 1 MG PO TABS
1.00 | ORAL_TABLET | ORAL | Status: DC
Start: 2018-10-23 — End: 2018-10-23

## 2018-10-23 MED ORDER — GENERIC EXTERNAL MEDICATION
2.00 | Status: DC
Start: ? — End: 2018-10-23

## 2018-10-23 MED ORDER — SODIUM CHLORIDE FLUSH 0.9 % IV SOLN
5.00 | INTRAVENOUS | Status: DC
Start: 2018-10-22 — End: 2018-10-23

## 2018-10-23 MED ORDER — NICOTINE POLACRILEX 2 MG MT GUM
2.00 | CHEWING_GUM | OROMUCOSAL | Status: DC
Start: ? — End: 2018-10-23

## 2018-10-23 MED ORDER — FAMOTIDINE 20 MG PO TABS
20.00 | ORAL_TABLET | ORAL | Status: DC
Start: 2018-10-22 — End: 2018-10-23

## 2018-10-23 MED ORDER — THIAMINE HCL 100 MG PO TABS
100.00 | ORAL_TABLET | ORAL | Status: DC
Start: 2018-10-23 — End: 2018-10-23

## 2018-10-23 MED ORDER — GENERIC EXTERNAL MEDICATION
1.00 | Status: DC
Start: 2018-10-23 — End: 2018-10-23

## 2018-10-23 NOTE — Progress Notes (Signed)
TTS consulted with Donell Sievert, PA who recommends inpt treatment. [facility] to review for possible admission per [facility].  EDP Vanetta Mulders, MD and RN have been advised of the disposition.   Princess Bruins, MSW, LCSW Therapeutic Triage Specialist  [phone]

## 2018-10-23 NOTE — BH Assessment (Addendum)
Tele Assessment Note   Patient Name: [name] [name] MRN: [id number] Referring Physician: Renne CriglerGeiple, Joshua, PA-C [facility] Location of Provider: Behavioral Health TTS Department  [name] [name] is an 58 y.o. male who presents to the ED voluntarily. Pt states he has been experiencing worsening SI with a plan to cut his wrists. Pt states he feels suicidal because he is homeless, has no support, is in constant pain, and feels helpless. Pt states 2 days ago he cut his wrists in front of [facility] because he felt like he was not getting help. Pt states he would rather die than to continue feeling this way and states if he does not get help he is going to cut his wrists.   Pt endorses passive HI but no plan. Pt states he feels anger towards anyone who gets on his nerves and has thoughts of hurting others when they make him angry. Pt states he hears ringing in his ears that keep him awake and the pain he experiences daily cause him to sleep for 1-2 hours at a time. Pt states he has been homeless since 2007 and he feels overwhelmed and burnt out.   Pt has been evaluated multiple times due to alcohol abuse and depression. [facility] but he does not feel the medication is helping.   TTS consulted with Donell SievertSpencer Simon, PA who recommends inpt treatment. [facility]  EDP Vanetta MuldersZackowski, Scott, MD and RN have been advised of the disposition.   Diagnosis: MDD, recurrent, severe, w/o psychosis; Alcohol use disorder, severe  Past Medical History:  Past Medical History:  Diagnosis Date  . Alcoholic (HCC)   . Anxiety   . COPD (chronic obstructive pulmonary disease) (HCC)   . Depression   . Hepatitis C   . History of blood clots   . Homelessness   . Malingering   . Peripheral neuropathy   . Polysubstance abuse [facility](HCC)     Past Surgical History:  Procedure Laterality Date  . ABDOMINAL SURGERY    . HIP SURGERY      Family  History: No family history on file.  Social History:  reports that he has been smoking cigarettes. He has a 7.50 pack-year smoking history. He has never used smokeless tobacco. He reports current alcohol use. He reports previous drug use. Drugs: Cocaine and Marijuana.  Additional Social History:  Alcohol / Drug Use Pain Medications: See MAR Prescriptions: See MAR Over the Counter: See MAR History of alcohol / drug use?: Yes Longest period of sobriety (when/how long): 18-24 months when incarcerated in 2015-2017 Negative Consequences of Use: Financial, Personal relationships Withdrawal Symptoms: Patient aware of relationship between substance abuse and physical/medical complications, Fever / Chills, Tremors, DTs Substance #1 Name of Substance 1: Alcohol 1 - Age of First Use: 10 1 - Amount (size/oz): excessive 1 - Frequency: daily 1 - Duration: ongoing 1 - Last Use / Amount: 10/23/18  CIWA: CIWA-Ar BP: 109/72 Pulse Rate: 99 COWS:    Allergies:  Allergies  Allergen Reactions  . Fentanyl Itching  . Hydromorphone Hcl Itching    Pt  States itching all over Pt  States itching all over Pt  States itching all over   . Morphine Itching  . Pantoprazole Hives  . Pantoprazole Sodium Hives  . Nsaids Nausea And Vomiting, Other (See Comments) and Hives    Severe cramping Other reaction(s): Abdominal Pain [facility]  . Morphine And  Related Itching  . Ibuprofen   . Ketorolac Other (See Comments)  . Pantoprazole Sodium Hives    Home Medications: (Not in a hospital admission)   OB/GYN Status:  No LMP for male patient.  General Assessment Data Location of Assessment: [facility] TTS Assessment: In system Is this a Tele or Face-to-Face Assessment?: Tele Assessment Is this an Initial Assessment or a Re-assessment for this encounter?: Initial Assessment Patient Accompanied by:: N/A Language Other than English: No Living Arrangements: Homeless/Shelter What  gender do you identify as?: Male Marital status: Single Pregnancy Status: No Living Arrangements: Alone Can pt return to current living arrangement?: Yes Admission Status: Voluntary Is patient capable of signing voluntary admission?: Yes Referral Source: Self/Family/Friend Insurance type: none     Crisis Care Plan Living Arrangements: Alone Name of Psychiatrist: [facility] Name of Therapist: [facility]  Education Status Is patient currently in school?: No Is the patient employed, unemployed or receiving disability?: Unemployed  Risk to self with the past 6 months Suicidal Ideation: Yes-Currently Present Has patient been a risk to self within the past 6 months prior to admission? : Yes Suicidal Intent: Yes-Currently Present Has patient had any suicidal intent within the past 6 months prior to admission? : Yes Is patient at risk for suicide?: Yes Suicidal Plan?: Yes-Currently Present Has patient had any suicidal plan within the past 6 months prior to admission? : Yes Specify Current Suicidal Plan: pt states he has a plan to cut his wrists  Access to Means: Yes Specify Access to Suicidal Means: pt states he has a razor What has been your use of drugs/alcohol within the last 12 months?: daily alcohol use Previous Attempts/Gestures: Yes How many times?: 1 Other Self Harm Risks: hx of suicide attempt, depression, alcohol abuse Triggers for Past Attempts: Unpredictable Intentional Self Injurious Behavior: Cutting Comment - Self Injurious Behavior: intentionally cut his wrists  Family Suicide History: No Recent stressful life event(s): Financial Problems, Recent negative physical changes, Turmoil (Comment)(homeless, alcohol abuse) Persecutory voices/beliefs?: No Depression: Yes Depression Symptoms: Insomnia, Loss of interest in usual pleasures, Feeling worthless/self pity, Feeling angry/irritable Substance abuse history and/or treatment for substance abuse?: Yes Suicide prevention  information given to non-admitted patients: Not applicable  Risk to Others within the past 6 months Homicidal Ideation: No-Not Currently/Within Last 6 Months Does patient have any lifetime risk of violence toward others beyond the six months prior to admission? : No Thoughts of Harm to Others: No Current Homicidal Intent: No Current Homicidal Plan: No Access to Homicidal Means: No History of harm to others?: No Assessment of Violence: On admission Does patient have access to weapons?: No Criminal Charges Pending?: No Does patient have a court date: No Is patient on probation?: No  Psychosis Hallucinations: None noted Delusions: None noted  Mental Status Report Appearance/Hygiene: In scrubs, Unremarkable Eye Contact: Good Motor Activity: Freedom of movement Speech: Logical/coherent Level of Consciousness: Alert Mood: Depressed Affect: Depressed, Sad Anxiety Level: None Thought Processes: Coherent, Relevant Judgement: Impaired Orientation: Place, Person, Time, Situation, Appropriate for developmental age Obsessive Compulsive Thoughts/Behaviors: None  Cognitive Functioning Concentration: Normal Memory: Remote Intact, Recent Intact Is patient IDD: No Insight: Poor Impulse Control: Poor Appetite: Fair Have you had any weight changes? : No Change Sleep: Decreased Total Hours of Sleep: 2 Vegetative Symptoms: None  ADLScreening [facility] Assessment Services) Patient's cognitive ability adequate to safely complete daily activities?: Yes Patient able to express need for assistance with ADLs?: Yes Independently performs ADLs?: Yes (appropriate for developmental age)  Prior  Inpatient Therapy Prior Inpatient Therapy: Yes Prior Therapy Dates: 2020, 2019, and many others Prior [facility] Reason for Treatment: MH issues  Prior Outpatient Therapy Prior Outpatient Therapy: Yes Prior Therapy Dates: current Prior Therapy  Facilty/Provider(s): [facility] Reason for Treatment: MH treatment Does patient have an ACCT team?: No Does patient have Intensive In-House Services?  : No Does patient have [facility] services? : No Does patient have P4CC services?: No  ADL Screening (condition at time of admission) Patient's cognitive ability adequate to safely complete daily activities?: Yes Is the patient deaf or have difficulty hearing?: No Does the patient have difficulty seeing, even when wearing glasses/contacts?: No Does the patient have difficulty concentrating, remembering, or making decisions?: No Patient able to express need for assistance with ADLs?: Yes Does the patient have difficulty dressing or bathing?: No Independently performs ADLs?: Yes (appropriate for developmental age) Does the patient have difficulty walking or climbing stairs?: No Weakness of Legs: Right Weakness of Arms/Hands: None  Home Assistive Devices/Equipment Home Assistive Devices/Equipment: Cane (specify quad or straight)    Abuse/Neglect Assessment (Assessment to be complete while patient is alone) Abuse/Neglect Assessment Can Be Completed: Yes Physical Abuse: Denies Verbal Abuse: Denies Sexual Abuse: Denies Exploitation of patient/patient's resources: Denies Self-Neglect: Denies     Merchant navy officerAdvance Directives (For Healthcare) Does Patient Have a Medical Advance Directive?: No Would patient like information on creating a medical advance directive?: No - Patient declined          Disposition: TTS consulted with Donell SievertSpencer Simon, PA who recommends inpt treatment. [facility]  EDP Vanetta MuldersZackowski, Scott, MD and RN have been advised of the disposition.   Disposition Initial Assessment Completed for this Encounter: Yes Disposition of Patient: Admit(per Donell SievertSpencer Simon, PA) Type of inpatient treatment program: Adult Patient refused recommended treatment: No  This service was provided via telemedicine using a  2-way, interactive audio and video technology.  Names of all persons participating in this telemedicine service and their role in this encounter. Name:  [name] [name] Role: Patient  Name: Princess BruinsAquicha Dayna Alia Role: TTS          Karolee Ohsquicha R Sherree Shankman 10/23/2018 8:48 PM
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
## 2018-10-23 NOTE — ED Notes (Signed)
Pt monitors

## 2018-10-23 NOTE — ED Notes (Signed)
Informed by charge nurse that patient voiced of killing himself while he has in radiology.   EDP informed of this matter and stated that patient was already evaluated at [facility] of the same.
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
## 2018-10-23 NOTE — ED Provider Notes (Addendum)
Patient seen by me along with a physician assistant.  Medical screening examination/treatment/procedure(s) were conducted as a shared visit with non-physician practitioner(s) and myself.  I personally evaluated the patient during the encounter.  EKG Interpretation  Date/Time:  Thursday October 23 2018 17:11:12 EST Ventricular Rate:  102 PR Interval:    QRS Duration: 85 QT Interval:  360 QTC Calculation: 469 R Axis:   76 Text Interpretation:  Sinus tachycardia Confirmed by Vanetta Mulders [phone]) on 10/23/2018 5:15:02 PM  Patient brought in by EMS stating that he had leg pain.  Patient was just discharged from [facility] for alcohol intoxication where he was kept overnight.  Their final notes state that patient denied suicidal ideation at the time of discharge even though he had raised some during the morning hours.  Here patient eventually went on to state that he was suicidal.  At that discussion with him says he is going to cut his wrists.  Clinically a little bit suspicious based on the full story.  However based on his statements we will have [facility] evaluate him.  Patient without any evidence of alcohol withdrawal at this time.  Work-up for the leg pain on the right side had a Doppler study done which showed no evidence of a deep vein thrombosis.     Vanetta Mulders, MD 10/23/18 1942   Patient interviewed by behavioral health.  They are recommending admission to looking for placement.   Vanetta Mulders, MD 10/24/18 [phone]

## 2018-10-23 NOTE — ED Notes (Signed)
Per TTS; recommendation for inpatient therapy; awaiting bed placement; TTS will notify when patient has been accepted and has a bed.

## 2018-10-23 NOTE — ED Notes (Signed)
ED Provider at bedside. 

## 2018-10-24 NOTE — ED Notes (Signed)
ED Provider at bedside. 

## 2018-10-24 NOTE — ED Notes (Signed)
TTS consult at the bedside.  

## 2018-10-24 NOTE — ED Notes (Signed)
4 patient belonging bags placed at nurses station and one suitcase with bag at nursing station; patient placed in hospital scrubs; warm blankets given to patient; no complaints voiced.

## 2018-10-24 NOTE — ED Notes (Signed)
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  He stated that he wants to be dropped off at [facility].   Patient stated that he has 3 brothers that live at [place].  I encouraged him to reach out to them but he stated that he does not want to bother them.

## 2018-10-24 NOTE — ED Provider Notes (Signed)
Pt was again evaluated by behavioral health and at this time he is thought to be stating that he is suicidal so that he can remain in the hospital until he is placed in a rehab facility.  He does not have a plan but when told he will be discharged he threatens more suicidal thoughts.  Patient has been seen in multiple facilities lately with a ongoing history of substance abuse.  Behavioral health as well as Dr. Lucianne Muss with psychiatry do not feel that patient meets increased patient criteria and needs to follow-up with an outpatient on his own free well with substance abuse detox and rehab.  Patient does not need hospitalization at this time and he will be discharged.  He was given outpatient resources.   Gwyneth Sprout, MD 10/24/18 [phone]

## 2018-10-24 NOTE — Progress Notes (Signed)
Patient was seen by me via tele-psych and I have consulted with Dr. Lucianne Muss.  Patient reports that he feels the same as he did yesterday and still has suicidal thoughts.  Then patient reports that the only thing that he needs is to get some help and go through detox again and then go to a rehabilitation facility.  He reports that he has been to numerous rehabilitation facilities including [facility], which she is not allowed to return to, [facility], [facility], and has been to [facility] in the past.  Patient was just discharged from [facility] in January and was noncompliant with follow-up and continued using substances after he discharged.  Patient reports that he has has a long history of substance abuse and has not been compliant with any treatments.  Patient states that he feels he needs to be in the hospital until he is found placement by the facility into a rehabilitation facility.  Patient is informed that mental health hospital does not do only detox and that he has follow-up with the facilities for placement as well as his outpatient services.  Patient is using suicidal ideations as a threat to be placed in the hospital until he is found placement.  Patient did not have a plan.  Patient denied any homicidal ideations or hallucinations.  At this time patient does not meet inpatient criteria and is psychiatric cleared.  It appears the patient is seeking secondary gain from the hospital by making these accusations.  I contacted Rhea Bleacher PA-C and notified him of the recommendations and he stated agreement.

## 2018-10-24 NOTE — ED Notes (Signed)
Patient given [employer] engineer

## 2019-02-06 IMAGING — US US EXTREM LOW VENOUS*R*
1 series · 13 of 24 positions shown · non-contrast
Comparison: None.

CLINICAL DATA: Chronic RIGHT lower extremity pain and swelling.
History of deep vein thrombosis and IVC filter.



[Series 1: us extrem low venous*right* · 0.06mm/px · 13 of 30 slices shown]
[im 1/30]
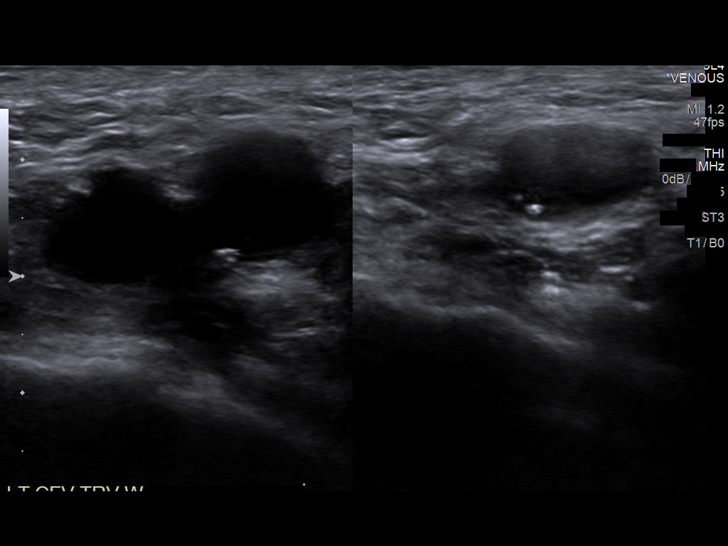
[im 3/30]
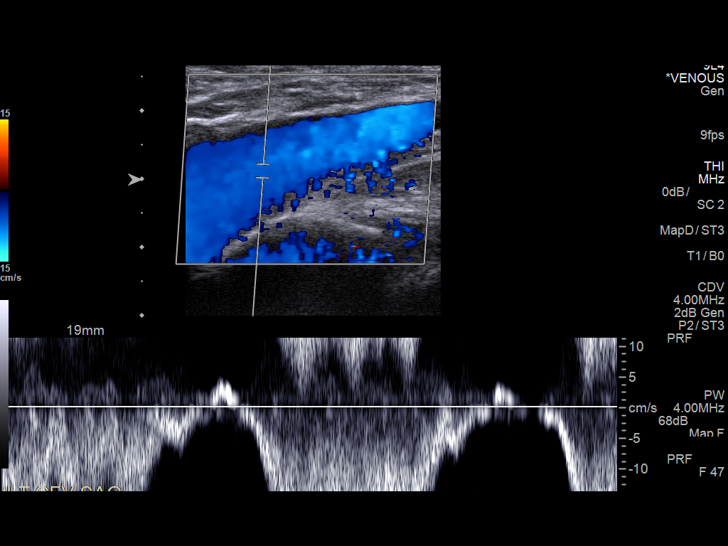
[im 6/30]
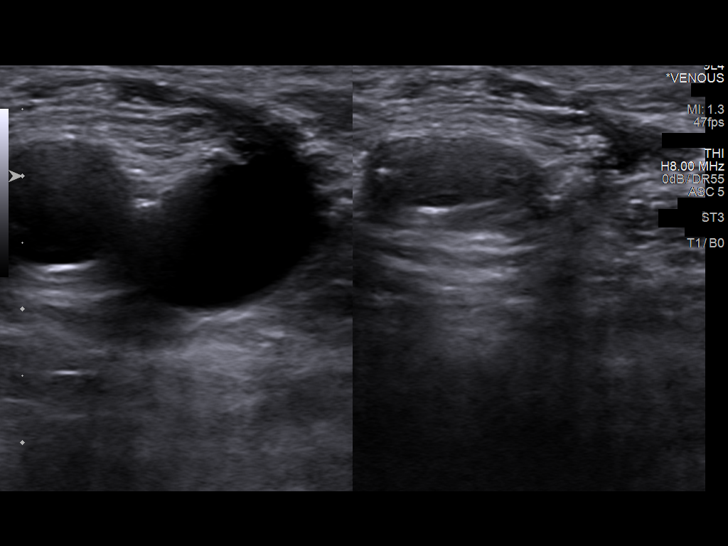
[im 8/30]
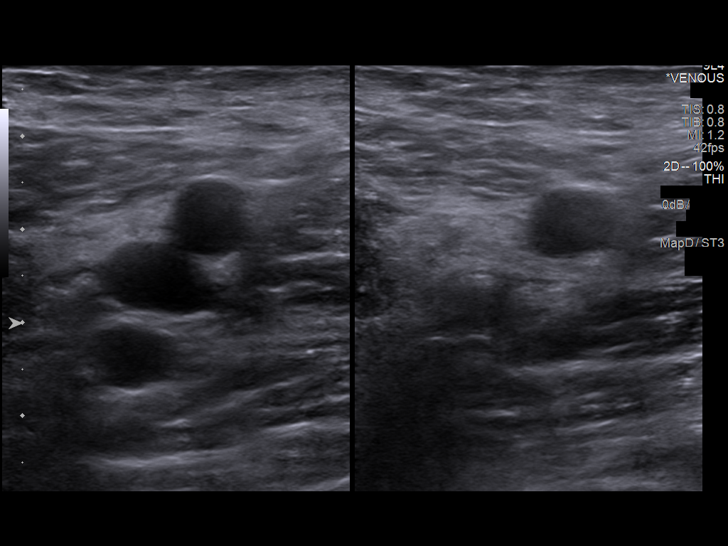
[im 11/30]
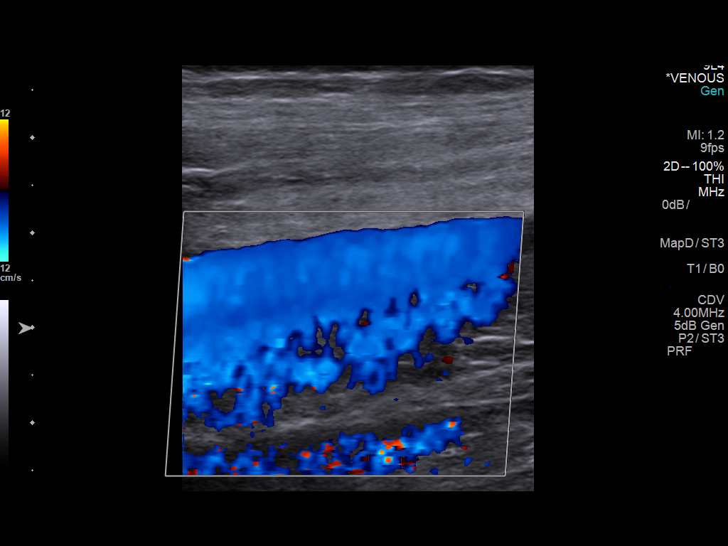
[im 13/30]
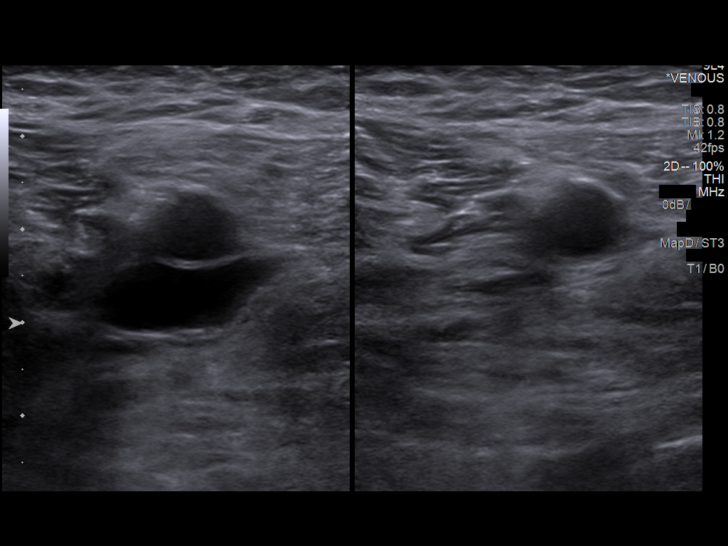
[im 16/30]
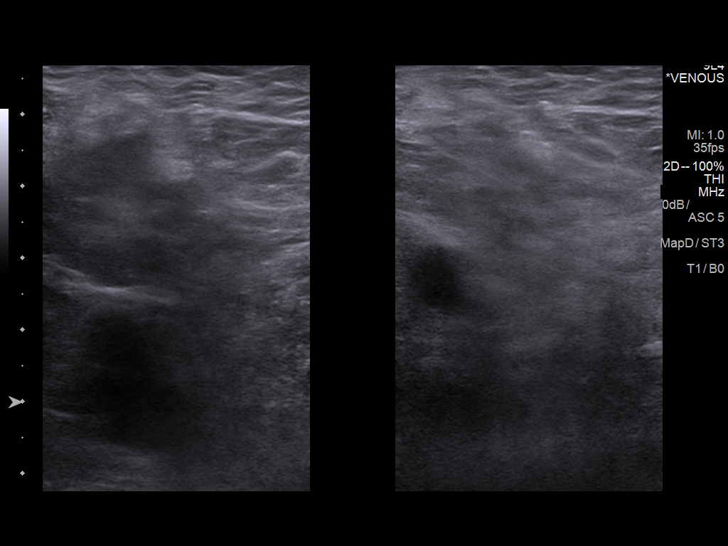
[im 17/30]
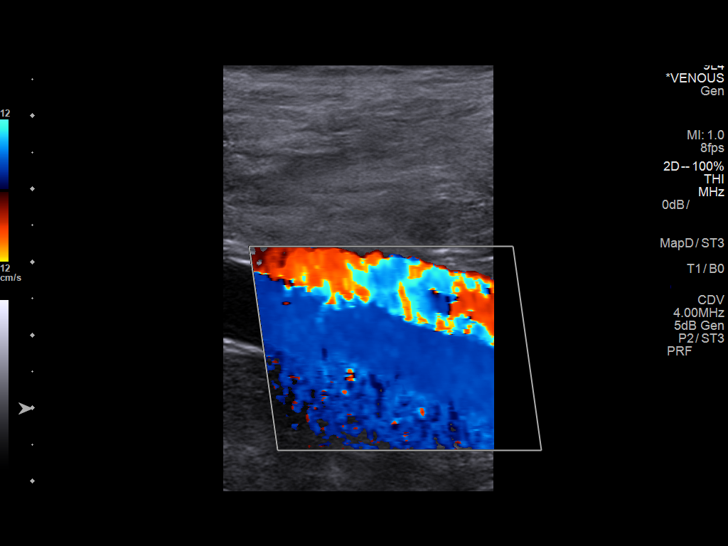
[im 19/30]
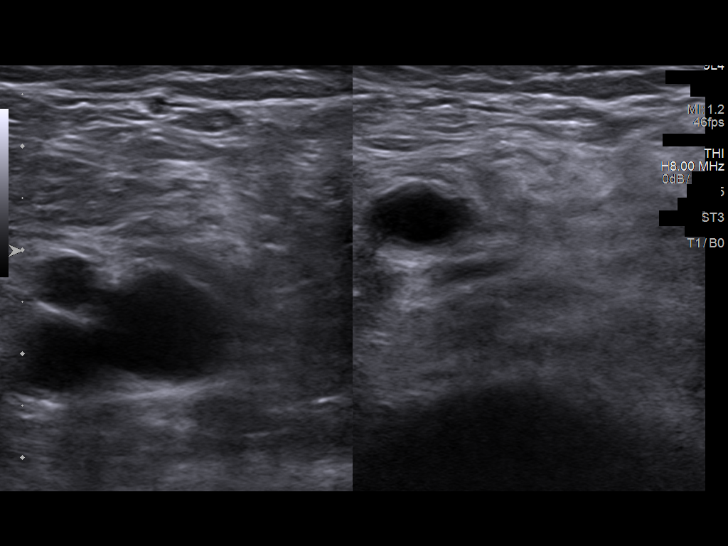
[im 22/30]
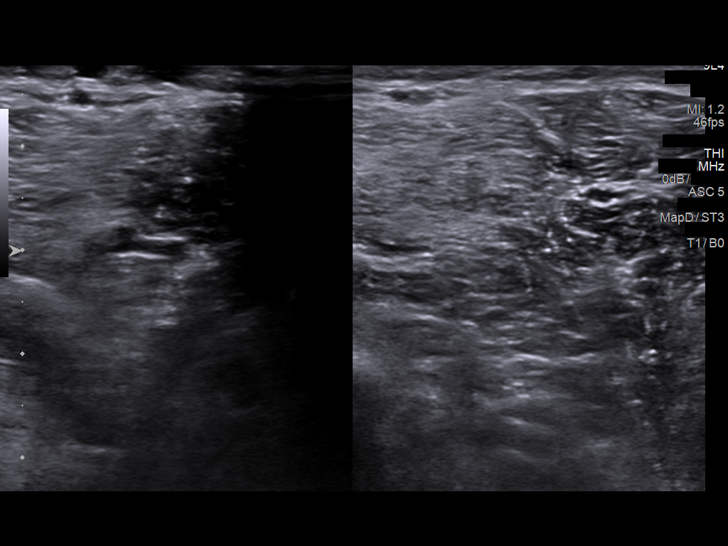
[im 24/30]
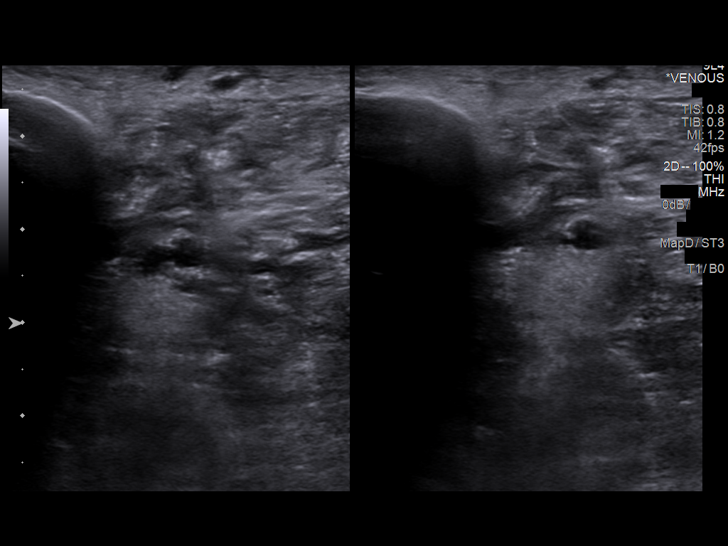
[im 27/30]
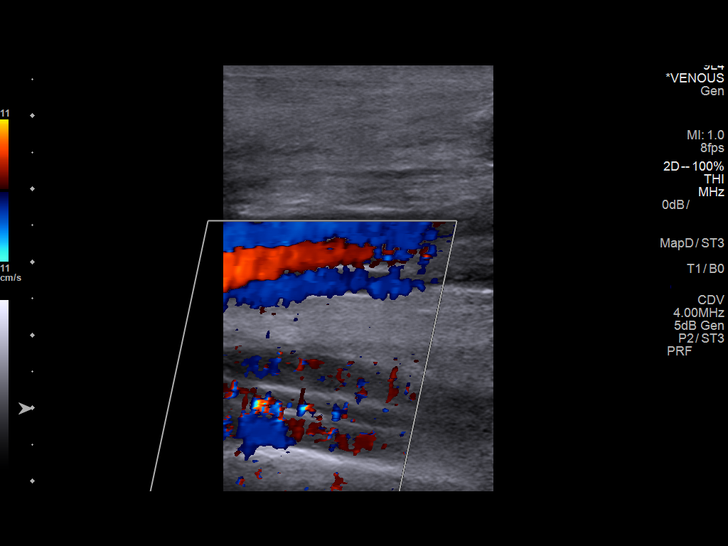
[im 30/30]
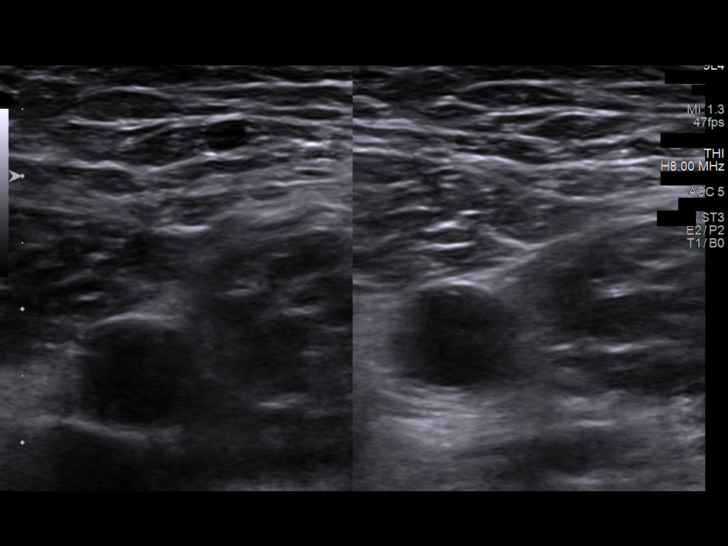

[13 of 24 positions shown; findings below may reference images not displayed]

FINDINGS: Contralateral Common Femoral Vein: Respiratory phasicity is normal
and symmetric with the symptomatic side. No evidence of thrombus.
Normal compressibility.

Common Femoral Vein: No evidence of thrombus. Normal
compressibility, respiratory phasicity and response to augmentation.

Saphenofemoral Junction: No evidence of thrombus. Normal
compressibility and flow on color Doppler imaging.

Profunda Femoral Vein: No evidence of thrombus. Normal
compressibility and flow on color Doppler imaging.

Femoral Vein: No evidence of thrombus. Normal compressibility,
respiratory phasicity and response to augmentation.

Popliteal Vein: No evidence of thrombus. Normal compressibility,
respiratory phasicity and response to augmentation.

Calf Veins: No evidence of thrombus. Normal compressibility and flow
on color Doppler imaging.

Superficial Great Saphenous Vein: No evidence of thrombus. Normal
compressibility.

Other Findings:  None.
IMPRESSION: No RIGHT lower extremity deep vein thrombosis.

## 2020-10-21 ENCOUNTER — Other Ambulatory Visit: Payer: Self-pay

## 2020-10-21 ENCOUNTER — Emergency Department (HOSPITAL_COMMUNITY)
Admission: EM | Admit: 2020-10-21 | Discharge: 2020-10-22 | Disposition: A | Discharge status: Home / Self Care | Payer: Medicaid Other | Attending: Emergency Medicine | Admitting: Emergency Medicine

## 2020-10-21 DIAGNOSIS — Z5900 Homelessness unspecified: Secondary | ICD-10-CM | POA: Insufficient documentation

## 2020-10-21 DIAGNOSIS — Z5321 Procedure and treatment not carried out due to patient leaving prior to being seen by health care provider: Secondary | ICD-10-CM | POA: Insufficient documentation

## 2020-10-21 NOTE — ED Triage Notes (Signed)
Pt presents to ED BIB [facility]. Pt c/o homeless and not having safe place to stay. EMS VSS

## 2020-10-22 NOTE — ED Notes (Signed)
Called for Pt again to recheck vitals. No answer. Pt's Labels have been left in his place and Pt is not seen waiting in waiting room.

## 2020-10-22 NOTE — ED Notes (Addendum)
Pt states he is having suicidal and homicidal thoughts and he's wanting to see to that some of the people in the lobby are dead. Pt also states that he is on bipolar and schizophrenic medicine.

## 2020-10-22 NOTE — ED Notes (Signed)
Pt stated to registration he is leaving.

## 2020-10-22 NOTE — ED Notes (Signed)
Called pt x3 for vitals, no response. 

## 2022-04-16 ENCOUNTER — Other Ambulatory Visit: Payer: Self-pay

## 2022-04-16 ENCOUNTER — Emergency Department (HOSPITAL_BASED_OUTPATIENT_CLINIC_OR_DEPARTMENT_OTHER)
Admission: EM | Admit: 2022-04-16 | Discharge: 2022-04-16 | Disposition: A | Discharge status: Home / Self Care | Payer: Medicaid Other | Attending: Emergency Medicine | Admitting: Emergency Medicine

## 2022-04-16 ENCOUNTER — Emergency Department (HOSPITAL_BASED_OUTPATIENT_CLINIC_OR_DEPARTMENT_OTHER): Payer: Medicaid Other

## 2022-04-16 ENCOUNTER — Encounter (HOSPITAL_BASED_OUTPATIENT_CLINIC_OR_DEPARTMENT_OTHER): Payer: Self-pay | Admitting: Emergency Medicine

## 2022-04-16 DIAGNOSIS — Z951 Presence of aortocoronary bypass graft: Secondary | ICD-10-CM | POA: Diagnosis not present

## 2022-04-16 DIAGNOSIS — M79604 Pain in right leg: Secondary | ICD-10-CM | POA: Diagnosis not present

## 2022-04-16 DIAGNOSIS — R0789 Other chest pain: Secondary | ICD-10-CM | POA: Insufficient documentation

## 2022-04-16 DIAGNOSIS — J449 Chronic obstructive pulmonary disease, unspecified: Secondary | ICD-10-CM | POA: Insufficient documentation

## 2022-04-16 DIAGNOSIS — R000 Tachycardia, unspecified: Secondary | ICD-10-CM | POA: Insufficient documentation

## 2022-04-16 DIAGNOSIS — Z7901 Long term (current) use of anticoagulants: Secondary | ICD-10-CM | POA: Diagnosis not present

## 2022-04-16 DIAGNOSIS — I509 Heart failure, unspecified: Secondary | ICD-10-CM | POA: Diagnosis not present

## 2022-04-16 DIAGNOSIS — I2510 Atherosclerotic heart disease of native coronary artery without angina pectoris: Secondary | ICD-10-CM | POA: Insufficient documentation

## 2022-04-16 LAB — BASIC METABOLIC PANEL
Anion gap: 8 (ref 5–15)
BUN: 33 mg/dL — ABNORMAL HIGH (ref 8–23)
CO2: 26 mmol/L (ref 22–32)
Calcium: 8.6 mg/dL — ABNORMAL LOW (ref 8.9–10.3)
Chloride: 102 mmol/L (ref 98–111)
Creatinine, Ser: 1.02 mg/dL (ref 0.61–1.24)
GFR, Estimated: >60 mL/min (ref >60)
Glucose, Bld: 99 mg/dL (ref 70–99)
Potassium: 4 mmol/L (ref 3.5–5.1)
Sodium: 136 mmol/L (ref 135–145)

## 2022-04-16 LAB — CBC
HCT: 37.3 % — ABNORMAL LOW (ref 39.0–52.0)
Hemoglobin: 11.8 g/dL — ABNORMAL LOW (ref 13.0–17.0)
MCH: 27.6 pg (ref 26.0–34.0)
MCHC: 31.6 g/dL (ref 30.0–36.0)
MCV: 87.1 fL (ref 80.0–100.0)
Platelets: 297 10*3/uL (ref 150–400)
RBC: 4.28 MIL/uL (ref 4.22–5.81)
RDW: 23 % — ABNORMAL HIGH (ref 11.5–15.5)
WBC: 8.8 10*3/uL (ref 4.0–10.5)
nRBC: 0 % (ref 0.0–0.2)

## 2022-04-16 LAB — TROPONIN I (HIGH SENSITIVITY)
Troponin I (High Sensitivity): 25 ng/L — ABNORMAL HIGH (ref <18)
Troponin I (High Sensitivity): 22 ng/L — ABNORMAL HIGH (ref <18)

## 2022-04-16 LAB — BRAIN NATRIURETIC PEPTIDE: B Natriuretic Peptide: 1269.6 pg/mL — ABNORMAL HIGH (ref 0.0–100.0)

## 2022-04-16 MED ORDER — ACETAMINOPHEN 325 MG PO TABS
650.0000 mg | ORAL_TABLET | Freq: Once | ORAL | Status: AC
  Administered 2022-04-16: 650 mg via ORAL
  Filled 2022-04-16: qty 2

## 2022-04-16 NOTE — ED Notes (Signed)
Patient refused to go over discharge papers or sign for them. Patient ambulatory to lobby without assistance

## 2022-04-16 NOTE — ED Triage Notes (Signed)
Pt BIBA- pt c/o right leg pain x2 weeks, reports difficulty walking. Also c/o headache x2 weeks.

## 2022-04-16 NOTE — ED Notes (Signed)
Pt complaining of being hot. Pt given a fan. Pt requested something to drink. Pt requested sprite and given the same ok per PA [facility]

## 2022-04-16 NOTE — ED Notes (Signed)
Pt informed NT that he is now having mid chest pain, sharp in nature.  EKG obtained, EDP made aware.

## 2022-04-16 NOTE — ED Provider Notes (Signed)
[facility] EMERGENCY DEPARTMENT Provider Note   CSN: [id number] Arrival date & time: 04/16/22  1815     History  Chief Complaint  Patient presents with   Leg Pain    [name] [name] is a 61 y.o. male with a past medical history of CAD, substance abuse, alcohol use disorder, who presents to the emergency department with concerns for chest pain onset today.  Notes that chest pain is sharp and he last noticed it while sitting prior to my evaluation.  Also has associated shortness of breath that is exacerbating with ambulation.  Complains of right leg pain onset 2 weeks.  Denies recent injury, trauma, fall.  No meds tried prior to arrival.  Patient notes that he was recently in the hospital where they wanted to do an open heart surgery on him however he left AMA at that time.  Per patient chart review: Patient was evaluated at [facility] on 04/13/2022 where he was admitted for acute on chronic CHF exacerbation.  At that time it was recommended that patient receive a CABG due to an LVEF of 15%.  However, patient left AMA on 04/15/2022.  The history is provided by the patient. No language interpreter was used.       Home Medications Prior to Admission medications   Medication Sig Start Date End Date Taking? Authorizing Provider  albuterol (PROVENTIL HFA;VENTOLIN HFA) 108 (90 Base) MCG/ACT inhaler Inhale 1-2 puffs into the lungs every 6 (six) hours as needed for wheezing or shortness of breath.  01/13/18   [provider]  gabapentin (NEURONTIN) 300 MG capsule Take 1 capsule (300 mg total) by mouth 3 (three) times daily. 09/10/18   Charm Rings, NP  sertraline (ZOLOFT) 100 MG tablet Take by mouth. 10/17/18 11/16/18  [provider]  traZODone (DESYREL) 50 MG tablet Take by mouth. 10/17/18   [provider]      Allergies    Fentanyl, Hydromorphone hcl, Morphine, Pantoprazole, Pantoprazole sodium, Nsaids, Morphine and related, Ibuprofen, Ketorolac,  and Pantoprazole sodium    Review of Systems   Review of Systems  Respiratory:  Positive for shortness of breath.   Cardiovascular:  Positive for chest pain.  Musculoskeletal:  Positive for arthralgias. Negative for joint swelling.  Skin:  Negative for color change and wound.  All other systems reviewed and are negative.   Physical Exam Updated Vital Signs BP 112/69 (BP Location: Right Arm)   Pulse 94   Temp 98.2 F (36.8 C) (Oral)   Resp 19   SpO2 100%  Physical Exam Vitals and nursing note reviewed.  Constitutional:      General: He is not in acute distress.    Appearance: He is not diaphoretic.  HENT:     Head: Normocephalic and atraumatic.     Mouth/Throat:     Pharynx: No oropharyngeal exudate.  Eyes:     General: No scleral icterus.    Conjunctiva/sclera: Conjunctivae normal.  Cardiovascular:     Rate and Rhythm: Normal rate and regular rhythm.     Pulses: Normal pulses.     Heart sounds: Normal heart sounds.  Pulmonary:     Effort: Pulmonary effort is normal. No respiratory distress.     Breath sounds: Normal breath sounds. No wheezing.  Abdominal:     General: Bowel sounds are normal.     Palpations: Abdomen is soft. There is no mass.     Tenderness: There is no abdominal tenderness. There is no guarding or rebound.  Musculoskeletal:        General: Normal range of motion.     Cervical back: Normal range of motion and neck supple.     Comments: No BLE swelling noted on exam. TTP noted to RLE calf and distal RLE. No overlying skin changes. No pitting edema noted on exam. ROM intact against resistance to BLE.  Skin:    General: Skin is warm and dry.  Neurological:     Mental Status: He is alert.  Psychiatric:        Behavior: Behavior normal.    ED Results / Procedures / Treatments   Labs (all labs ordered are listed, but only abnormal results are displayed) Labs Reviewed  BASIC METABOLIC PANEL - Abnormal; Notable for the following components:       Result Value   BUN 33 (*)    Calcium 8.6 (*)    All other components within normal limits  CBC - Abnormal; Notable for the following components:   Hemoglobin 11.8 (*)    HCT 37.3 (*)    RDW 23.0 (*)    All other components within normal limits  BRAIN NATRIURETIC PEPTIDE - Abnormal; Notable for the following components:   B Natriuretic Peptide 1,269.6 (*)    All other components within normal limits  TROPONIN I (HIGH SENSITIVITY) - Abnormal; Notable for the following components:   Troponin I (High Sensitivity) 25 (*)    All other components within normal limits  TROPONIN I (HIGH SENSITIVITY) - Abnormal; Notable for the following components:   Troponin I (High Sensitivity) 22 (*)    All other components within normal limits    EKG EKG Interpretation  Date/Time:  Monday April 16 2022 21:54:47 EDT Ventricular Rate:  127 PR Interval:  142 QRS Duration: 88 QT Interval:  308 QTC Calculation: 448 R Axis:   10 Text Interpretation: Sinus tachycardia LAE, consider biatrial enlargement Nonspecific T abnrm, anterolateral leads Confirmed by Virgina Norfolk ([id number]) on 04/17/2022 3:07:33 PM  Radiology DG Chest Port 1 View  Result Date: 04/16/2022 CLINICAL DATA:  Radiographs 05/04/2018 EXAM: PORTABLE CHEST 1 VIEW COMPARISON:  Radiographs 10/21/2018 FINDINGS: Since 10/21/2018, increased size of the cardiomediastinal silhouette. Pulmonary vascular congestion. Prominent central pulmonary arteries. Pleuroparenchymal thickening along the right lateral mid lung likely related to chronic rib fractures. Biapical scarring. Bibasilar atelectasis/scarring. No pleural effusion or pneumothorax. IVC filter. IMPRESSION: Increased size of the cardiomediastinal silhouette since 10/21/2018. Mild pulmonary vascular congestion. Electronically Signed   By: Minerva Fester M.D.   On: 04/16/2022 20:36    Procedures Procedures    Medications Ordered in ED Medications  acetaminophen (TYLENOL) tablet 650 mg (650 mg Oral  Given 04/16/22 2246)    ED Course/ Medical Decision Making/ A&P Clinical Course as of 04/17/22 1816  Mon Apr 16, 2022  2022 Patient visualized to be ambulating without difficulty or assistance. [SB]  2123 Discussed with patient that we will work on attempting admission at this time. Pt agreeable at this time. [SB]  2143 Notified by secretary that there were no current available beds for admission at [facility]. [SB]  2248 Consult with hospitalist, Dr. Antionette Char who noted that at this time patient does not meet admission criteria due to his labs being stable overall from previous hospital admission this past weekend.  Also notes that due to patient able to ambulate freely within the emergency department without exacerbation of his symptoms and patient not currently be on oxygen, patient did not meet admission criteria at this time.  Hospitalist recommended that patient follow-up with his care team through [facility]. [SB]  2329 Discussed with patient regarding discharge treatment plan.  Discussed with patient I attempted to get the patient admitted at this time however patient did not meet hospital admission at this time.  Discussed with patient importance of maintaining follow-up with his cardiologist as scheduled on 05/08/2022.  Also discussed with patient recommendations from hospitalist. Pt appears safe for discharge. [SB]  2331 Patient visualized ambulating to the bathroom without assistance or difficulty [SB]    Clinical Course User Index [SB] Adden Strout A, PA-C                           Medical Decision Making Amount and/or Complexity of Data Reviewed Labs: ordered. Radiology: ordered. ECG/medicine tests: ordered.  Risk OTC drugs.   Patient presents to the ED with chest pain onset today.  Patient has a history of CAD, cirrhosis, COPD, PE on Eliquis, alcoholism.  Patient notes that he was supposed to have a CABG while admitted to the hospital at [facility] however he left  AMA.  Initial vital signs stable, patient afebrile, not tachycardic or hypoxic.  On exam patient with no bilateral lower extremity swelling noted on exam.  Tenderness to palpation noted to right lower extremity calf and distal right lower extremity.  No overlying skin changes.  No pitting edema noted on exam.  Range of motion intact bilaterally to lower extremities against resistance.  No acute cardiovascular respiratory exam findings.  Differential diagnosis includes ACS, CHF exacerbation, pneumothorax.   Additional history obtained:  External records from outside source obtained and reviewed including: Patient was evaluated at [facility] on 04/13/2022 where he was admitted for acute on chronic CHF exacerbation.  At that time it was recommended that patient receive a CABG due to an LVEF of 15%.  However, patient left AMA on 04/15/2022.  Patient also had bilateral lower extremity DVT studies completed that were negative.  Patient has a follow-up appoint with cardiology on 05/08/2022.  Review of cardiology consult note from admission at [facility] noted recommendation that patient could be discharged and would not benefit from further acute hospital stay.  At that time patient was diuresed with Lasix.  During their note it was noted that patient would not comply with recommendations from cardiology.  Also recommended medical therapy for symptoms.  EKG:  Sinus tachycardia.  Labs:  I ordered, and personally interpreted labs.  The pertinent results include:   Initial troponin at 25, delta troponin at 22 CBC without leukocytosis BMP unremarkable BNP at 1269.6, stable from previous values during admission at [facility].  Imaging: I ordered imaging studies including CXR I independently visualized and interpreted imaging which showed:  Increased size of the cardiomediastinal silhouette since 10/21/2018.  Mild pulmonary vascular congestion.   I agree with the radiologist  interpretation  Medications:  I ordered medication including Tylenol for symptom management Reevaluation of the patient after these medicines and interventions, I reevaluated the patient and found that they have improved I have reviewed the patients home medicines and have made adjustments as needed   Cardiac Monitoring: The patient was maintained on a cardiac monitor.  I personally viewed and interpreted the cardiac monitored which showed an underlying rhythm of: Sinus tachycardia.    Consultations: I requested consultation with the Hospitalist, Dr. Antionette Char and discussed lab and imaging findings as well as pertinent plan - they recommend: At this  time patient does not meet admission criteria due to his labs being stable overall from previous hospital admission this past weekend.  Also notes that due to patient able to ambulate freely within the emergency department without exacerbation of his symptoms and patient not currently be on oxygen, patient did not meet admission criteria at this time.  Hospitalist recommended that patient follow-up with his care team through [facility].  Disposition: Presentation suspicious for chronic leg pain.  Doubt CHF exacerbation at this time.  Doubt ACS. Chest x-ray without acute findings of pneumothorax at this time.  Doubt pneumonia at this time. Patient reevaluated prior to consult and vital signs stable, no acute distress, patient resting comfortably on stretcher.  Due to no acute abnormalities on labs today, BMP within normal range from previous values at [facility].  Troponins downtrending, patient ambulating in the emergency department without assistance or difficulty, no oxygen requirement in the emergency department.  No obvious fluid retention noted on exam.  After consideration of the diagnostic results and the patients response to treatment, I feel that the patient would benefit from discharge home.  Case discussed with attending who agrees with  discharge treatment plan at this time.  Discussed supportive care measures and strict return precautions discussed with patient at bedside.  Discussed with patient importance of maintaining follow-up with his cardiologist as scheduled on August 22 regarding today's ED visit.  Patient appears safe for discharge at this time.  Follow-up as indicated discharge paperwork.     This chart was dictated using voice recognition software, Dragon. Despite the best efforts of this provider to proofread and correct errors, errors may still occur which can change documentation meaning.   Final Clinical Impression(s) / ED Diagnoses Final diagnoses:  Right leg pain  Atypical chest pain    Rx / DC Orders ED Discharge Orders     None         Chioke Noxon A, PA-C 04/17/22 2340    Jacalyn Lefevre, MD 04/19/22 [phone]

## 2022-04-16 NOTE — Discharge Instructions (Addendum)
It was a pleasure taking care of you today!   Your labs today were overall unremarkable and stable from previous values. It is important that you maintain your scheduled follow-up appointment with the cardiologist on 05/08/2022.  You may follow with your primary care provider as needed.  Return to the emergency department for experiencing increasing/worsening chest pain, trouble breathing, worsening symptoms.

## 2022-04-16 NOTE — ED Notes (Signed)
Patient complaining of chest pain  RN approved EKG

## 2023-12-06 ENCOUNTER — Emergency Department (HOSPITAL_COMMUNITY): Payer: MEDICAID

## 2023-12-06 ENCOUNTER — Other Ambulatory Visit: Payer: Self-pay

## 2023-12-06 ENCOUNTER — Encounter (HOSPITAL_COMMUNITY): Payer: Self-pay | Admitting: Emergency Medicine

## 2023-12-06 ENCOUNTER — Inpatient Hospital Stay (HOSPITAL_COMMUNITY)
Admission: EM | Admit: 2023-12-06 | Discharge: 2024-01-05 | DRG: 559 | Discharge status: Against Medical Advice | Payer: MEDICAID | Attending: Internal Medicine | Admitting: Internal Medicine

## 2023-12-06 DIAGNOSIS — M4622 Osteomyelitis of vertebra, cervical region: Secondary | ICD-10-CM | POA: Diagnosis present

## 2023-12-06 DIAGNOSIS — I5022 Chronic systolic (congestive) heart failure: Secondary | ICD-10-CM

## 2023-12-06 DIAGNOSIS — J439 Emphysema, unspecified: Secondary | ICD-10-CM

## 2023-12-06 DIAGNOSIS — Z886 Allergy status to analgesic agent status: Secondary | ICD-10-CM

## 2023-12-06 DIAGNOSIS — I110 Hypertensive heart disease with heart failure: Secondary | ICD-10-CM | POA: Diagnosis present

## 2023-12-06 DIAGNOSIS — R251 Tremor, unspecified: Secondary | ICD-10-CM | POA: Diagnosis present

## 2023-12-06 DIAGNOSIS — Z8249 Family history of ischemic heart disease and other diseases of the circulatory system: Secondary | ICD-10-CM

## 2023-12-06 DIAGNOSIS — E860 Dehydration: Secondary | ICD-10-CM | POA: Diagnosis present

## 2023-12-06 DIAGNOSIS — M4856XA Collapsed vertebra, not elsewhere classified, lumbar region, initial encounter for fracture: Secondary | ICD-10-CM | POA: Diagnosis present

## 2023-12-06 DIAGNOSIS — G8929 Other chronic pain: Secondary | ICD-10-CM | POA: Diagnosis present

## 2023-12-06 DIAGNOSIS — Z608 Other problems related to social environment: Secondary | ICD-10-CM | POA: Diagnosis present

## 2023-12-06 DIAGNOSIS — K222 Esophageal obstruction: Secondary | ICD-10-CM | POA: Diagnosis present

## 2023-12-06 DIAGNOSIS — D638 Anemia in other chronic diseases classified elsewhere: Secondary | ICD-10-CM | POA: Diagnosis not present

## 2023-12-06 DIAGNOSIS — R519 Headache, unspecified: Secondary | ICD-10-CM

## 2023-12-06 DIAGNOSIS — B9562 Methicillin resistant Staphylococcus aureus infection as the cause of diseases classified elsewhere: Secondary | ICD-10-CM | POA: Diagnosis present

## 2023-12-06 DIAGNOSIS — Z6821 Body mass index (BMI) 21.0-21.9, adult: Secondary | ICD-10-CM

## 2023-12-06 DIAGNOSIS — Z1629 Resistance to other single specified antibiotic: Secondary | ICD-10-CM | POA: Diagnosis present

## 2023-12-06 DIAGNOSIS — S32000A Wedge compression fracture of unspecified lumbar vertebra, initial encounter for closed fracture: Secondary | ICD-10-CM

## 2023-12-06 DIAGNOSIS — Y792 Prosthetic and other implants, materials and accessory orthopedic devices associated with adverse incidents: Secondary | ICD-10-CM | POA: Diagnosis present

## 2023-12-06 DIAGNOSIS — Z9151 Personal history of suicidal behavior: Secondary | ICD-10-CM

## 2023-12-06 DIAGNOSIS — M4652 Other infective spondylopathies, cervical region: Secondary | ICD-10-CM | POA: Diagnosis present

## 2023-12-06 DIAGNOSIS — Z5982 Transportation insecurity: Secondary | ICD-10-CM

## 2023-12-06 DIAGNOSIS — M4854XA Collapsed vertebra, not elsewhere classified, thoracic region, initial encounter for fracture: Secondary | ICD-10-CM | POA: Diagnosis present

## 2023-12-06 DIAGNOSIS — J449 Chronic obstructive pulmonary disease, unspecified: Secondary | ICD-10-CM

## 2023-12-06 DIAGNOSIS — Y831 Surgical operation with implant of artificial internal device as the cause of abnormal reaction of the patient, or of later complication, without mention of misadventure at the time of the procedure: Secondary | ICD-10-CM | POA: Diagnosis present

## 2023-12-06 DIAGNOSIS — I330 Acute and subacute infective endocarditis: Secondary | ICD-10-CM | POA: Diagnosis present

## 2023-12-06 DIAGNOSIS — E871 Hypo-osmolality and hyponatremia: Secondary | ICD-10-CM

## 2023-12-06 DIAGNOSIS — Z9889 Other specified postprocedural states: Secondary | ICD-10-CM

## 2023-12-06 DIAGNOSIS — Z7901 Long term (current) use of anticoagulants: Secondary | ICD-10-CM

## 2023-12-06 DIAGNOSIS — E46 Unspecified protein-calorie malnutrition: Secondary | ICD-10-CM

## 2023-12-06 DIAGNOSIS — L89152 Pressure ulcer of sacral region, stage 2: Secondary | ICD-10-CM | POA: Diagnosis present

## 2023-12-06 DIAGNOSIS — E222 Syndrome of inappropriate secretion of antidiuretic hormone: Secondary | ICD-10-CM | POA: Diagnosis present

## 2023-12-06 DIAGNOSIS — Z515 Encounter for palliative care: Secondary | ICD-10-CM

## 2023-12-06 DIAGNOSIS — B182 Chronic viral hepatitis C: Secondary | ICD-10-CM | POA: Diagnosis present

## 2023-12-06 DIAGNOSIS — Z79899 Other long term (current) drug therapy: Secondary | ICD-10-CM

## 2023-12-06 DIAGNOSIS — R64 Cachexia: Secondary | ICD-10-CM

## 2023-12-06 DIAGNOSIS — Z1624 Resistance to multiple antibiotics: Secondary | ICD-10-CM | POA: Diagnosis present

## 2023-12-06 DIAGNOSIS — R7881 Bacteremia: Secondary | ICD-10-CM | POA: Diagnosis present

## 2023-12-06 DIAGNOSIS — I493 Ventricular premature depolarization: Secondary | ICD-10-CM | POA: Diagnosis present

## 2023-12-06 DIAGNOSIS — Z86718 Personal history of other venous thrombosis and embolism: Secondary | ICD-10-CM

## 2023-12-06 DIAGNOSIS — I255 Ischemic cardiomyopathy: Secondary | ICD-10-CM

## 2023-12-06 DIAGNOSIS — M542 Cervicalgia: Secondary | ICD-10-CM

## 2023-12-06 DIAGNOSIS — E43 Unspecified severe protein-calorie malnutrition: Secondary | ICD-10-CM | POA: Insufficient documentation

## 2023-12-06 DIAGNOSIS — M4642 Discitis, unspecified, cervical region: Secondary | ICD-10-CM | POA: Diagnosis present

## 2023-12-06 DIAGNOSIS — M86351 Chronic multifocal osteomyelitis, right femur: Secondary | ICD-10-CM

## 2023-12-06 DIAGNOSIS — M109 Gout, unspecified: Secondary | ICD-10-CM | POA: Diagnosis present

## 2023-12-06 DIAGNOSIS — Z885 Allergy status to narcotic agent status: Secondary | ICD-10-CM

## 2023-12-06 DIAGNOSIS — N10 Acute tubulo-interstitial nephritis: Secondary | ICD-10-CM | POA: Diagnosis present

## 2023-12-06 DIAGNOSIS — Z86711 Personal history of pulmonary embolism: Secondary | ICD-10-CM

## 2023-12-06 DIAGNOSIS — I21A1 Myocardial infarction type 2: Secondary | ICD-10-CM | POA: Diagnosis present

## 2023-12-06 DIAGNOSIS — E872 Acidosis, unspecified: Secondary | ICD-10-CM

## 2023-12-06 DIAGNOSIS — I5042 Chronic combined systolic (congestive) and diastolic (congestive) heart failure: Secondary | ICD-10-CM | POA: Insufficient documentation

## 2023-12-06 DIAGNOSIS — L02415 Cutaneous abscess of right lower limb: Secondary | ICD-10-CM | POA: Diagnosis present

## 2023-12-06 DIAGNOSIS — T8451XA Infection and inflammatory reaction due to internal right hip prosthesis, initial encounter: Principal | ICD-10-CM | POA: Diagnosis present

## 2023-12-06 DIAGNOSIS — Z5329 Procedure and treatment not carried out because of patient's decision for other reasons: Secondary | ICD-10-CM | POA: Diagnosis not present

## 2023-12-06 DIAGNOSIS — K4090 Unilateral inguinal hernia, without obstruction or gangrene, not specified as recurrent: Secondary | ICD-10-CM | POA: Diagnosis present

## 2023-12-06 DIAGNOSIS — F419 Anxiety disorder, unspecified: Secondary | ICD-10-CM | POA: Diagnosis present

## 2023-12-06 DIAGNOSIS — Z765 Malingerer [conscious simulation]: Secondary | ICD-10-CM

## 2023-12-06 DIAGNOSIS — F1990 Other psychoactive substance use, unspecified, uncomplicated: Secondary | ICD-10-CM

## 2023-12-06 DIAGNOSIS — S22000A Wedge compression fracture of unspecified thoracic vertebra, initial encounter for closed fracture: Secondary | ICD-10-CM

## 2023-12-06 DIAGNOSIS — Z888 Allergy status to other drugs, medicaments and biological substances status: Secondary | ICD-10-CM

## 2023-12-06 DIAGNOSIS — Z5900 Homelessness unspecified: Secondary | ICD-10-CM

## 2023-12-06 DIAGNOSIS — F32A Depression, unspecified: Secondary | ICD-10-CM | POA: Diagnosis present

## 2023-12-06 DIAGNOSIS — E785 Hyperlipidemia, unspecified: Secondary | ICD-10-CM | POA: Diagnosis present

## 2023-12-06 DIAGNOSIS — I079 Rheumatic tricuspid valve disease, unspecified: Secondary | ICD-10-CM

## 2023-12-06 DIAGNOSIS — F1510 Other stimulant abuse, uncomplicated: Secondary | ICD-10-CM | POA: Diagnosis present

## 2023-12-06 DIAGNOSIS — M247 Protrusio acetabuli: Secondary | ICD-10-CM | POA: Diagnosis present

## 2023-12-06 DIAGNOSIS — M00051 Staphylococcal arthritis, right hip: Secondary | ICD-10-CM | POA: Diagnosis present

## 2023-12-06 DIAGNOSIS — Z5948 Other specified lack of adequate food: Secondary | ICD-10-CM

## 2023-12-06 DIAGNOSIS — I76 Septic arterial embolism: Secondary | ICD-10-CM

## 2023-12-06 DIAGNOSIS — F1410 Cocaine abuse, uncomplicated: Secondary | ICD-10-CM | POA: Diagnosis present

## 2023-12-06 DIAGNOSIS — I2510 Atherosclerotic heart disease of native coronary artery without angina pectoris: Secondary | ICD-10-CM | POA: Insufficient documentation

## 2023-12-06 DIAGNOSIS — Z8614 Personal history of Methicillin resistant Staphylococcus aureus infection: Secondary | ICD-10-CM

## 2023-12-06 DIAGNOSIS — K5900 Constipation, unspecified: Secondary | ICD-10-CM | POA: Diagnosis not present

## 2023-12-06 DIAGNOSIS — G629 Polyneuropathy, unspecified: Secondary | ICD-10-CM | POA: Diagnosis present

## 2023-12-06 DIAGNOSIS — Z95828 Presence of other vascular implants and grafts: Secondary | ICD-10-CM

## 2023-12-06 DIAGNOSIS — F17210 Nicotine dependence, cigarettes, uncomplicated: Secondary | ICD-10-CM

## 2023-12-06 DIAGNOSIS — M549 Dorsalgia, unspecified: Principal | ICD-10-CM

## 2023-12-06 DIAGNOSIS — Z91148 Patient's other noncompliance with medication regimen for other reason: Secondary | ICD-10-CM

## 2023-12-06 DIAGNOSIS — R45851 Suicidal ideations: Secondary | ICD-10-CM | POA: Diagnosis present

## 2023-12-06 DIAGNOSIS — J441 Chronic obstructive pulmonary disease with (acute) exacerbation: Secondary | ICD-10-CM | POA: Diagnosis present

## 2023-12-06 LAB — COMPREHENSIVE METABOLIC PANEL
ALT: 27 U/L (ref 0–44)
AST: 31 U/L (ref 15–41)
Albumin: 2.3 g/dL — ABNORMAL LOW (ref 3.5–5.0)
Alkaline Phosphatase: 70 U/L (ref 38–126)
Anion gap: 15 (ref 5–15)
BUN: 14 mg/dL (ref 8–23)
CO2: 19 mmol/L — ABNORMAL LOW (ref 22–32)
Calcium: 9.1 mg/dL (ref 8.9–10.3)
Chloride: 96 mmol/L — ABNORMAL LOW (ref 98–111)
Creatinine, Ser: 1.05 mg/dL (ref 0.61–1.24)
GFR, Estimated: >60 mL/min (ref >60)
Glucose, Bld: 80 mg/dL (ref 70–99)
Potassium: 4.2 mmol/L (ref 3.5–5.1)
Sodium: 130 mmol/L — ABNORMAL LOW (ref 135–145)
Total Bilirubin: 0.8 mg/dL (ref 0.0–1.2)
Total Protein: 8.4 g/dL — ABNORMAL HIGH (ref 6.5–8.1)

## 2023-12-06 LAB — CBC WITH DIFFERENTIAL/PLATELET
Abs Immature Granulocytes: 0.06 10*3/uL (ref 0.00–0.07)
Basophils Absolute: 0 10*3/uL (ref 0.0–0.1)
Basophils Relative: 0 %
Eosinophils Absolute: 0 10*3/uL (ref 0.0–0.5)
Eosinophils Relative: 0 %
HCT: 36.6 % — ABNORMAL LOW (ref 39.0–52.0)
Hemoglobin: 11.8 g/dL — ABNORMAL LOW (ref 13.0–17.0)
Immature Granulocytes: 1 %
Lymphocytes Relative: 8 %
Lymphs Abs: 0.9 10*3/uL (ref 0.7–4.0)
MCH: 29 pg (ref 26.0–34.0)
MCHC: 32.2 g/dL (ref 30.0–36.0)
MCV: 89.9 fL (ref 80.0–100.0)
Monocytes Absolute: 0.9 10*3/uL (ref 0.1–1.0)
Monocytes Relative: 8 %
Neutro Abs: 9.1 10*3/uL — ABNORMAL HIGH (ref 1.7–7.7)
Neutrophils Relative %: 83 %
Platelets: 432 10*3/uL — ABNORMAL HIGH (ref 150–400)
RBC: 4.07 MIL/uL — ABNORMAL LOW (ref 4.22–5.81)
RDW: 16.2 % — ABNORMAL HIGH (ref 11.5–15.5)
WBC: 11 10*3/uL — ABNORMAL HIGH (ref 4.0–10.5)
nRBC: 0 % (ref 0.0–0.2)

## 2023-12-06 LAB — TROPONIN I (HIGH SENSITIVITY)
Troponin I (High Sensitivity): 13 ng/L (ref <18)
Troponin I (High Sensitivity): 14 ng/L (ref <18)

## 2023-12-06 LAB — I-STAT CG4 LACTIC ACID, ED: Lactic Acid, Venous: 2.5 mmol/L (ref 0.5–1.9)

## 2023-12-06 LAB — LIPASE, BLOOD: Lipase: 35 U/L (ref 11–51)

## 2023-12-06 LAB — PROTIME-INR
INR: 1.6 — ABNORMAL HIGH (ref 0.8–1.2)
Prothrombin Time: 19.2 s — ABNORMAL HIGH (ref 11.4–15.2)

## 2023-12-06 MED ORDER — IOHEXOL 350 MG/ML SOLN
75.0000 mL | Freq: Once | INTRAVENOUS | Status: AC | PRN
  Administered 2023-12-06: 75 mL via INTRAVENOUS

## 2023-12-06 MED ORDER — ACETAMINOPHEN 325 MG PO TABS
650.0000 mg | ORAL_TABLET | Freq: Once | ORAL | Status: AC
  Administered 2023-12-06: 650 mg via ORAL
  Filled 2023-12-06: qty 2

## 2023-12-06 MED ORDER — OXYCODONE HCL 5 MG PO TABS
10.0000 mg | ORAL_TABLET | Freq: Once | ORAL | Status: AC
  Administered 2023-12-06: 10 mg via ORAL
  Filled 2023-12-06: qty 2

## 2023-12-06 MED ORDER — SODIUM CHLORIDE 0.9 % IV BOLUS
1000.0000 mL | Freq: Once | INTRAVENOUS | Status: AC
  Administered 2023-12-06: 1000 mL via INTRAVENOUS

## 2023-12-06 NOTE — ED Provider Notes (Signed)
 [place] EMERGENCY DEPARTMENT AT [facility] Provider Note   CSN: [id number] Arrival date & time: 12/06/23  1952     History  Chief Complaint  Patient presents with   Abdominal Pain    [name] [name] is a 63 y.o. male.   Abdominal Pain    Patient was seen by me earlier at EMS triage.  Patient was recently admitted to the hospital at [facility] on March 8.  The patient was released on the 21st, today.  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  Patient had refused MRI.  Patient was evaluated by infectious disease and plan was for 6 weeks of dalbavancin antibiotics.  Patient asked to come to a different medical facility because he is still having pain in his abdomen and his back.  Patient states it is more difficult for him to walk  Home Medications Prior to Admission medications   Medication Sig Start Date End Date Taking? Authorizing Provider  albuterol (PROVENTIL HFA;VENTOLIN HFA) 108 (90 Base) MCG/ACT inhaler Inhale 1-2 puffs into the lungs every 6 (six) hours as needed for wheezing or shortness of breath.  01/13/18   [provider]  gabapentin (NEURONTIN) 300 MG capsule Take 1 capsule (300 mg total) by mouth 3 (three) times daily. 09/10/18   Charm Rings, NP  sertraline (ZOLOFT) 100 MG tablet Take by mouth. 10/17/18 11/16/18  [provider]  traZODone (DESYREL) 50 MG tablet Take by mouth. 10/17/18   [provider]      Allergies    Fentanyl, Hydromorphone hcl, Morphine, Pantoprazole, Pantoprazole sodium, Nsaids, Morphine and codeine, Ibuprofen, Ketorolac, and Pantoprazole sodium    Review of Systems   Review of Systems  Gastrointestinal:  Positive for abdominal pain.    Physical Exam Updated Vital Signs BP (!) 129/93   Pulse 91   Temp 98.6 F (37 C) (Oral)   Resp (!) 31   Wt 72.6 kg   SpO2 98%   BMI 21.12 kg/m  Physical  Exam Vitals and nursing note reviewed.  Constitutional:      Appearance: He is well-developed. He is not diaphoretic.  HENT:     Head: Normocephalic and atraumatic.     Right Ear: External ear normal.     Left Ear: External ear normal.  Eyes:     General: No scleral icterus.       Right eye: No discharge.        Left eye: No discharge.     Conjunctiva/sclera: Conjunctivae normal.  Neck:     Trachea: No tracheal deviation.  Cardiovascular:     Rate and Rhythm: Normal rate and regular rhythm.  Pulmonary:     Effort: Pulmonary effort is normal. No respiratory distress.     Breath sounds: Normal breath sounds. No stridor. No wheezing or rales.  Abdominal:     General: Bowel sounds are normal. There is no distension.     Palpations: Abdomen is soft.     Tenderness: There is generalized abdominal tenderness. There is no guarding or rebound.  Musculoskeletal:        General: No tenderness or deformity.     Cervical back: Neck supple.  Skin:    General: Skin is warm and dry.     Findings: No rash.  Neurological:     General: No focal deficit present.     Mental Status: He is alert.  Cranial Nerves: No cranial nerve deficit, dysarthria or facial asymmetry.     Sensory: No sensory deficit.     Motor: No abnormal muscle tone or seizure activity.     Coordination: Coordination normal.  Psychiatric:        Mood and Affect: Mood normal.     ED Results / Procedures / Treatments   Labs (all labs ordered are listed, but only abnormal results are displayed) Labs Reviewed  COMPREHENSIVE METABOLIC PANEL - Abnormal; Notable for the following components:      Result Value   Sodium 130 (*)    Chloride 96 (*)    CO2 19 (*)    Total Protein 8.4 (*)    Albumin 2.3 (*)    All other components within normal limits  CBC WITH DIFFERENTIAL/PLATELET - Abnormal; Notable for the following components:   WBC 11.0 (*)    RBC 4.07 (*)    Hemoglobin 11.8 (*)    HCT 36.6 (*)    RDW 16.2 (*)     Platelets 432 (*)    Neutro Abs 9.1 (*)    All other components within normal limits  PROTIME-INR - Abnormal; Notable for the following components:   Prothrombin Time 19.2 (*)    INR 1.6 (*)    All other components within normal limits  I-STAT CG4 LACTIC ACID, ED - Abnormal; Notable for the following components:   Lactic Acid, Venous 2.5 (*)    All other components within normal limits  I-STAT CG4 LACTIC ACID, ED - Abnormal; Notable for the following components:   Lactic Acid, Venous 2.3 (*)    All other components within normal limits  CULTURE, BLOOD (ROUTINE X 2)  CULTURE, BLOOD (ROUTINE X 2)  LIPASE, BLOOD  URINALYSIS, W/ REFLEX TO CULTURE (INFECTION SUSPECTED)  TROPONIN I (HIGH SENSITIVITY)  TROPONIN I (HIGH SENSITIVITY)    EKG EKG Interpretation Date/Time:  Friday December 06 2023 22:07:55 EDT Ventricular Rate:  95 PR Interval:  166 QRS Duration:  98 QT Interval:  387 QTC Calculation: 487 R Axis:   2  Text Interpretation: Sinus rhythm Ventricular premature complex Consider left atrial enlargement LVH with secondary repolarization abnormality Borderline prolonged QT interval Since last tracing rate slower Confirmed by [name] [phone]) on 12/06/2023 10:11:21 PM  Radiology CT L-SPINE NO CHARGE Result Date: 12/06/2023 CLINICAL DATA:  Acute nonlocalized abdominal pain EXAM: CT LUMBAR SPINE WITHOUT CONTRAST TECHNIQUE: Multidetector CT imaging of the lumbar spine was performed without intravenous contrast administration. Multiplanar CT image reconstructions were also generated. RADIATION DOSE REDUCTION: This exam was performed according to the departmental dose-optimization program which includes automated exposure control, adjustment of the mA and/or kV according to patient size and/or use of iterative reconstruction technique. COMPARISON:  CT abdomen and pelvis 05/04/2018 FINDINGS: Segmentation: 5 lumbar type vertebral bodies. Alignment: Normal alignment. Vertebrae: Compression of  the L2 vertebra with about 40% loss of height. This is new since prior study. No retropulsion of fracture fragments. Compression of the superior endplate of L3 with about 20% loss of height. This is new since the prior study. No retropulsion of fracture fragments. Compression of the superior endplate of L5 is unchanged since prior study. No destructive or expansile bone lesions. Paraspinal and other soft tissues: See additional report of CT abdomen and pelvis same date. Disc levels: Intervertebral disc space heights are mostly preserved. Mild endplate osteophyte formation. IMPRESSION: Compression deformities of L2 and L3, new since prior study. Acute compression is not excluded. No retropulsion of fracture  fragments. Electronically Signed   By: Burman Nieves M.D.   On: 12/06/2023 23:53   CT ABDOMEN PELVIS W CONTRAST Result Date: 12/06/2023 CLINICAL DATA:  Acute nonlocalized abdominal pain. EXAM: CT ABDOMEN AND PELVIS WITH CONTRAST TECHNIQUE: Multidetector CT imaging of the abdomen and pelvis was performed using the standard protocol following bolus administration of intravenous contrast. RADIATION DOSE REDUCTION: This exam was performed according to the departmental dose-optimization program which includes automated exposure control, adjustment of the mA and/or kV according to patient size and/or use of iterative reconstruction technique. CONTRAST:  75mL OMNIPAQUE IOHEXOL 350 MG/ML SOLN COMPARISON:  05/04/2018 FINDINGS: Lower chest: Emphysematous changes in the lung bases. Bronchiectasis and bronchial wall thickening with evidence of mucous plugging likely chronic bronchitis. Trace bilateral pleural effusions with basilar atelectasis. Prominent coronary artery calcifications. Hepatobiliary: No focal liver abnormality is seen. No gallstones, gallbladder wall thickening, or biliary dilatation. Pancreas: Unremarkable. No pancreatic ductal dilatation or surrounding inflammatory changes. Spleen: Normal in size  without focal abnormality. Adrenals/Urinary Tract: No adrenal gland nodules. Chronic scarring in the kidneys. Heterogeneous nephrograms bilaterally suggest evidence of acute pyelonephritis. No renal or ureteral obstruction. Visualized bladder is unremarkable. Portions of the bladder and pelvis are not visualized due to streak artifact from right hip arthroplasty. Stomach/Bowel: Stomach, small bowel, and colon are not abnormally distended. Stool fills the colon. There is a moderate-sized right inguinal hernia containing bowel but without obvious proximal obstruction. Visualization is limited due to the streak artifact. Vascular/Lymphatic: Calcification of the aorta. No aneurysm. Inferior vena caval filter. No significant lymphadenopathy. Reproductive: Prostate gland is mildly enlarged. Other: Small amount of free fluid along the pericolic gutters is nonspecific. No free air. 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 This is progressing since the previous study. Heterotopic ossification. Compression deformities of T12, L2, L3, and L5 demonstrate progression since the previous study. Acute compression is not excluded. Degenerative changes in the spine. Old rib fractures. IMPRESSION: 1. Emphysematous changes and chronic bronchitic changes in the lung bases. Trace bilateral pleural effusions. 2. Heterogeneous nephrograms bilaterally suggesting acute pyelonephritis. No abscess or hydronephrosis. 3. Moderate-sized right inguinal hernia containing bowel without obvious proximal obstruction. Visualization is limited due to streak artifact. 4. Aortic atherosclerosis.  Inferior vena caval filter. 5. Small amount of free fluid in the abdomen of nonspecific etiology. Electronically Signed   By: Burman Nieves M.D.   On: 12/06/2023 23:50    Procedures Procedures    Medications Ordered in ED Medications  acetaminophen (TYLENOL) tablet 650 mg (650 mg Oral  Given 12/06/23 2057)  sodium chloride 0.9 % bolus 1,000 mL (0 mLs Intravenous Stopped 12/06/23 2317)  oxyCODONE (Oxy IR/ROXICODONE) immediate release tablet 10 mg (10 mg Oral Given 12/06/23 2202)  iohexol (OMNIPAQUE) 350 MG/ML injection 75 mL (75 mLs Intravenous Contrast Given 12/06/23 2337)    ED Course/ Medical Decision Making/ A&P Clinical Course as of 12/07/23 0005  Fri Dec 06, 2023  2143 CBC with Differential(!) CBC shows elevated white blood cell count metabolic panel shows hyponatremia [JK]  2143 Troponin I (High Sensitivity) Troponin [JK]  2143 I-Stat Lactic Acid, ED(!!) Lactic acid level elevated [JK]  2149 Patient states he was able to tolerate oxycodone at [facility] [JK]  2209 Noted from [facility] indicate pt refusing MRI assessment [JK]  2357 CT scan shows emphysematous changes in the lungs.  Heterogeneous nephrograms are suggesting possible acute pyelonephritis.  No abscess or hydronephrosis. [JK]  2357 Patient has compression deformities of  T12 L2-L3 and L5.  Progressed since previous studies.  Acute compression is not excluded [JK]    Clinical Course User Index [JK] [name], MD                                 Medical Decision Making Problems Addressed: Acute midline back pain, unspecified back location: chronic illness or injury with exacerbation, progression, or side effects of treatment Compression fracture of lumbar vertebra, unspecified lumbar vertebral level, initial encounter [facility]): chronic illness or injury with exacerbation, progression, or side effects of treatment Hyponatremia: acute illness or injury that poses a threat to life or bodily functions Lactic acidosis: acute illness or injury that poses a threat to life or bodily functions  Amount and/or Complexity of Data Reviewed Labs: ordered. Decision-making details documented in ED Course. Radiology: ordered.  Risk OTC drugs. Prescription drug management.   Patient presented to the ED  for evaluation of increasing back pain and abdominal pain.  Patient recently in the hospital at another medical institution diagnosed with bacteremia.  Per the notes during his hospitalization there was concern for possible osteomyelitis.  Patient did not have any MRI evaluation.  Patient noted to have leukocytosis here as well as mild lactic acidosis.  No signs of any cardiac injury.  No signs of hepatitis or pancreatitis.  CT scan abdomen pelvis does show the possibility of pyelonephritis.  Urinalysis is currently pending.  Patient also has progression of compression fractures that were previously noted.  With his complaints osteomyelitis is a concern.  Will start the patient on IV antibiotics.  I will consult to the medical service for admission.  Patient does agree to MRI at this time.        Final Clinical Impression(s) / ED Diagnoses Final diagnoses:  Acute midline back pain, unspecified back location  Compression fracture of lumbar vertebra, unspecified lumbar vertebral level, initial encounter (HCC)  Lactic acidosis  Hyponatremia    Rx / DC Orders ED Discharge Orders     None         [name], MD 12/07/23 [phone]

## 2023-12-06 NOTE — ED Triage Notes (Signed)
 Pt in from hotel room via [facility] with back/abdominal pain, had recent hospital stay at [facility] x 2 wks for possible blood infection, pt states he was unhappy with his care and feels worse today.  VS en route: 78HR 100%RA 120/76

## 2023-12-06 NOTE — ED Provider Triage Note (Signed)
 Emergency Medicine Provider Triage Evaluation Note  [name] [name] , a 63 y.o. male  was evaluated in triage.  Pt complains of pain throughout his entire body.  Patient states his vital organs are hurting.  Patient states [facility]  He was admitted on the eighth.  Patient was just discharged on the 19th.  Patient felt like he was not ready to leave.  He complains of pain in his heart as well as his liver and his kidneys.  He also has chronic hip pain.  Review of Systems  Positive: Pain Negative: Fever  Physical Exam  BP (!) 157/76 (BP Location: Right Arm)   Pulse 93   Temp 97.6 F (36.4 C) (Oral)   Resp (!) 24   Wt 72.6 kg   SpO2 98%   BMI 21.12 kg/m  Gen:   Awake,   Resp:  Normal effort  MSK:   Moves extremities without difficulty  Other:    Medical Decision Making  Medically screening exam initiated at 8:24 PM.  Appropriate orders placed.  [name] [name] was informed that the remainder of the evaluation will be completed by another provider, this initial triage assessment does not replace that evaluation, and the importance of remaining in the ED until their evaluation is complete.  Records reviewed from patient's recent hospitalization.  He was admitted for hypoxia.  Patient was noted to have a history of polysubstance use disorder pulmonary embolism COPD MRSA bacteremia.  The patient was admitted on March 8 for flulike symptoms.  He noted to be calm hypoxic during his stay.  Patient was treated for COPD exacerbation.  Patient was then treated for possible sepsis during his stay.  Patient was noted to be agitated.  He ultimately was diagnosed with MRSA bacteremia.  Patient also had an NSTEMI type II  Will proceed with labs to assess for possible recurrent infection, NSTEMI   [name] Dibbles, MD 12/06/23 2028

## 2023-12-07 ENCOUNTER — Emergency Department (HOSPITAL_COMMUNITY): Payer: MEDICAID

## 2023-12-07 ENCOUNTER — Encounter (HOSPITAL_COMMUNITY): Payer: Self-pay

## 2023-12-07 ENCOUNTER — Inpatient Hospital Stay (HOSPITAL_COMMUNITY): Payer: MEDICAID

## 2023-12-07 DIAGNOSIS — Z1629 Resistance to other single specified antibiotic: Secondary | ICD-10-CM | POA: Diagnosis present

## 2023-12-07 DIAGNOSIS — N19 Unspecified kidney failure: Secondary | ICD-10-CM | POA: Diagnosis not present

## 2023-12-07 DIAGNOSIS — E871 Hypo-osmolality and hyponatremia: Secondary | ICD-10-CM | POA: Diagnosis not present

## 2023-12-07 DIAGNOSIS — E222 Syndrome of inappropriate secretion of antidiuretic hormone: Secondary | ICD-10-CM | POA: Diagnosis present

## 2023-12-07 DIAGNOSIS — B182 Chronic viral hepatitis C: Secondary | ICD-10-CM | POA: Diagnosis present

## 2023-12-07 DIAGNOSIS — G9520 Unspecified cord compression: Secondary | ICD-10-CM | POA: Diagnosis not present

## 2023-12-07 DIAGNOSIS — R64 Cachexia: Secondary | ICD-10-CM | POA: Diagnosis present

## 2023-12-07 DIAGNOSIS — T8451XA Infection and inflammatory reaction due to internal right hip prosthesis, initial encounter: Secondary | ICD-10-CM | POA: Diagnosis present

## 2023-12-07 DIAGNOSIS — B9562 Methicillin resistant Staphylococcus aureus infection as the cause of diseases classified elsewhere: Secondary | ICD-10-CM

## 2023-12-07 DIAGNOSIS — M549 Dorsalgia, unspecified: Secondary | ICD-10-CM | POA: Diagnosis not present

## 2023-12-07 DIAGNOSIS — Z95828 Presence of other vascular implants and grafts: Secondary | ICD-10-CM | POA: Diagnosis not present

## 2023-12-07 DIAGNOSIS — F1990 Other psychoactive substance use, unspecified, uncomplicated: Secondary | ICD-10-CM | POA: Diagnosis not present

## 2023-12-07 DIAGNOSIS — I110 Hypertensive heart disease with heart failure: Secondary | ICD-10-CM | POA: Diagnosis present

## 2023-12-07 DIAGNOSIS — I5042 Chronic combined systolic (congestive) and diastolic (congestive) heart failure: Secondary | ICD-10-CM | POA: Diagnosis present

## 2023-12-07 DIAGNOSIS — I330 Acute and subacute infective endocarditis: Secondary | ICD-10-CM | POA: Diagnosis present

## 2023-12-07 DIAGNOSIS — R7881 Bacteremia: Secondary | ICD-10-CM | POA: Diagnosis present

## 2023-12-07 DIAGNOSIS — J441 Chronic obstructive pulmonary disease with (acute) exacerbation: Secondary | ICD-10-CM | POA: Diagnosis present

## 2023-12-07 DIAGNOSIS — Z5900 Homelessness unspecified: Secondary | ICD-10-CM | POA: Diagnosis not present

## 2023-12-07 DIAGNOSIS — Z7189 Other specified counseling: Secondary | ICD-10-CM | POA: Diagnosis not present

## 2023-12-07 DIAGNOSIS — M86351 Chronic multifocal osteomyelitis, right femur: Secondary | ICD-10-CM | POA: Diagnosis present

## 2023-12-07 DIAGNOSIS — Y831 Surgical operation with implant of artificial internal device as the cause of abnormal reaction of the patient, or of later complication, without mention of misadventure at the time of the procedure: Secondary | ICD-10-CM | POA: Diagnosis present

## 2023-12-07 DIAGNOSIS — Z515 Encounter for palliative care: Secondary | ICD-10-CM | POA: Diagnosis not present

## 2023-12-07 DIAGNOSIS — Y792 Prosthetic and other implants, materials and accessory orthopedic devices associated with adverse incidents: Secondary | ICD-10-CM | POA: Diagnosis present

## 2023-12-07 DIAGNOSIS — I339 Acute and subacute endocarditis, unspecified: Secondary | ICD-10-CM | POA: Diagnosis not present

## 2023-12-07 DIAGNOSIS — I5022 Chronic systolic (congestive) heart failure: Secondary | ICD-10-CM | POA: Diagnosis not present

## 2023-12-07 DIAGNOSIS — I2510 Atherosclerotic heart disease of native coronary artery without angina pectoris: Secondary | ICD-10-CM | POA: Diagnosis not present

## 2023-12-07 DIAGNOSIS — F32A Depression, unspecified: Secondary | ICD-10-CM | POA: Diagnosis present

## 2023-12-07 DIAGNOSIS — M4856XA Collapsed vertebra, not elsewhere classified, lumbar region, initial encounter for fracture: Secondary | ICD-10-CM | POA: Diagnosis present

## 2023-12-07 DIAGNOSIS — E43 Unspecified severe protein-calorie malnutrition: Secondary | ICD-10-CM | POA: Diagnosis present

## 2023-12-07 DIAGNOSIS — I255 Ischemic cardiomyopathy: Secondary | ICD-10-CM | POA: Diagnosis not present

## 2023-12-07 DIAGNOSIS — I079 Rheumatic tricuspid valve disease, unspecified: Secondary | ICD-10-CM | POA: Diagnosis not present

## 2023-12-07 DIAGNOSIS — L89152 Pressure ulcer of sacral region, stage 2: Secondary | ICD-10-CM | POA: Diagnosis present

## 2023-12-07 DIAGNOSIS — I21A1 Myocardial infarction type 2: Secondary | ICD-10-CM | POA: Diagnosis present

## 2023-12-07 DIAGNOSIS — E872 Acidosis, unspecified: Secondary | ICD-10-CM | POA: Diagnosis present

## 2023-12-07 DIAGNOSIS — M4622 Osteomyelitis of vertebra, cervical region: Secondary | ICD-10-CM | POA: Diagnosis present

## 2023-12-07 DIAGNOSIS — S32000A Wedge compression fracture of unspecified lumbar vertebra, initial encounter for closed fracture: Secondary | ICD-10-CM | POA: Diagnosis not present

## 2023-12-07 DIAGNOSIS — F1410 Cocaine abuse, uncomplicated: Secondary | ICD-10-CM | POA: Diagnosis present

## 2023-12-07 DIAGNOSIS — M00051 Staphylococcal arthritis, right hip: Secondary | ICD-10-CM | POA: Diagnosis present

## 2023-12-07 DIAGNOSIS — M4854XA Collapsed vertebra, not elsewhere classified, thoracic region, initial encounter for fracture: Secondary | ICD-10-CM | POA: Diagnosis present

## 2023-12-07 LAB — URINALYSIS, W/ REFLEX TO CULTURE (INFECTION SUSPECTED)
Bilirubin Urine: NEGATIVE
Glucose, UA: NEGATIVE mg/dL
Hgb urine dipstick: NEGATIVE
Ketones, ur: NEGATIVE mg/dL
Leukocytes,Ua: NEGATIVE
Nitrite: NEGATIVE
Protein, ur: NEGATIVE mg/dL
RBC / HPF: NONE SEEN RBC/hpf (ref 0–5)
Specific Gravity, Urine: 1.01 (ref 1.005–1.030)
WBC, UA: NONE SEEN WBC/hpf (ref 0–5)
pH: 6.5 (ref 5.0–8.0)

## 2023-12-07 LAB — CBC
HCT: 29.8 % — ABNORMAL LOW (ref 39.0–52.0)
Hemoglobin: 9.8 g/dL — ABNORMAL LOW (ref 13.0–17.0)
MCH: 28.5 pg (ref 26.0–34.0)
MCHC: 32.9 g/dL (ref 30.0–36.0)
MCV: 86.6 fL (ref 80.0–100.0)
Platelets: 403 10*3/uL — ABNORMAL HIGH (ref 150–400)
RBC: 3.44 MIL/uL — ABNORMAL LOW (ref 4.22–5.81)
RDW: 16.4 % — ABNORMAL HIGH (ref 11.5–15.5)
WBC: 7.8 10*3/uL (ref 4.0–10.5)
nRBC: 0 % (ref 0.0–0.2)

## 2023-12-07 LAB — BASIC METABOLIC PANEL
Anion gap: 8 (ref 5–15)
BUN: 12 mg/dL (ref 8–23)
CO2: 22 mmol/L (ref 22–32)
Calcium: 8.7 mg/dL — ABNORMAL LOW (ref 8.9–10.3)
Chloride: 98 mmol/L (ref 98–111)
Creatinine, Ser: 0.9 mg/dL (ref 0.61–1.24)
GFR, Estimated: >60 mL/min (ref >60)
Glucose, Bld: 102 mg/dL — ABNORMAL HIGH (ref 70–99)
Potassium: 4.2 mmol/L (ref 3.5–5.1)
Sodium: 128 mmol/L — ABNORMAL LOW (ref 135–145)

## 2023-12-07 LAB — I-STAT CG4 LACTIC ACID, ED: Lactic Acid, Venous: 2.3 mmol/L (ref 0.5–1.9)

## 2023-12-07 LAB — LACTIC ACID, PLASMA
Lactic Acid, Venous: 1.8 mmol/L (ref 0.5–1.9)
Lactic Acid, Venous: 1.6 mmol/L (ref 0.5–1.9)

## 2023-12-07 LAB — MAGNESIUM: Magnesium: 1.6 mg/dL — ABNORMAL LOW (ref 1.7–2.4)

## 2023-12-07 LAB — HIV ANTIBODY (ROUTINE TESTING W REFLEX): HIV Screen 4th Generation wRfx: NONREACTIVE

## 2023-12-07 LAB — PHOSPHORUS: Phosphorus: 2.9 mg/dL (ref 2.5–4.6)

## 2023-12-07 MED ORDER — ACETAMINOPHEN 325 MG PO TABS
650.0000 mg | ORAL_TABLET | Freq: Four times a day (QID) | ORAL | Status: DC | PRN
  Administered 2023-12-08 – 2023-12-19 (×4): 650 mg via ORAL
  Filled 2023-12-07 (×5): qty 2

## 2023-12-07 MED ORDER — APIXABAN 5 MG PO TABS
5.0000 mg | ORAL_TABLET | Freq: Two times a day (BID) | ORAL | Status: DC
  Administered 2023-12-07 – 2024-01-04 (×56): 5 mg via ORAL
  Filled 2023-12-07 (×60): qty 1

## 2023-12-07 MED ORDER — VANCOMYCIN HCL IN DEXTROSE 1-5 GM/200ML-% IV SOLN
1000.0000 mg | INTRAVENOUS | Status: DC
  Administered 2023-12-07: 1000 mg via INTRAVENOUS
  Filled 2023-12-07: qty 200

## 2023-12-07 MED ORDER — OXYCODONE HCL 5 MG PO TABS: 5.0000 mg | ORAL_TABLET | Freq: Four times a day (QID) | ORAL | Status: DC | PRN

## 2023-12-07 MED ORDER — LORAZEPAM 2 MG/ML IJ SOLN
0.5000 mg | Freq: Once | INTRAMUSCULAR | Status: AC
  Administered 2023-12-07: 0.5 mg via INTRAVENOUS
  Filled 2023-12-07 (×2): qty 1

## 2023-12-07 MED ORDER — POLYETHYLENE GLYCOL 3350 17 G PO PACK
17.0000 g | PACK | Freq: Every day | ORAL | Status: DC | PRN
  Administered 2023-12-19: 17 g via ORAL
  Filled 2023-12-07: qty 1

## 2023-12-07 MED ORDER — PROCHLORPERAZINE EDISYLATE 10 MG/2ML IJ SOLN: 5.0000 mg | Freq: Four times a day (QID) | INTRAMUSCULAR | Status: DC | PRN

## 2023-12-07 MED ORDER — OXYCODONE HCL 5 MG PO TABS
10.0000 mg | ORAL_TABLET | ORAL | Status: DC | PRN
  Administered 2023-12-07 – 2023-12-08 (×3): 10 mg via ORAL
  Filled 2023-12-07 (×3): qty 2

## 2023-12-07 MED ORDER — OXYCODONE HCL 5 MG PO TABS
5.0000 mg | ORAL_TABLET | Freq: Four times a day (QID) | ORAL | Status: DC | PRN
  Administered 2023-12-07: 5 mg via ORAL
  Filled 2023-12-07: qty 1

## 2023-12-07 MED ORDER — LACTATED RINGERS IV BOLUS: 1000.0000 mL | Freq: Once | INTRAVENOUS | Status: DC

## 2023-12-07 MED ORDER — MELATONIN 5 MG PO TABS
5.0000 mg | ORAL_TABLET | Freq: Every evening | ORAL | Status: DC | PRN
  Administered 2023-12-07 – 2023-12-26 (×12): 5 mg via ORAL
  Filled 2023-12-07 (×14): qty 1

## 2023-12-07 MED ORDER — HYDROMORPHONE HCL 1 MG/ML IJ SOLN
0.5000 mg | INTRAMUSCULAR | Status: DC | PRN
  Administered 2023-12-07 – 2023-12-08 (×2): 0.5 mg via INTRAVENOUS
  Filled 2023-12-07 (×2): qty 0.5

## 2023-12-07 MED ORDER — DIPHENHYDRAMINE HCL 25 MG PO CAPS: 25.0000 mg | ORAL_CAPSULE | Freq: Four times a day (QID) | ORAL | Status: DC | PRN

## 2023-12-07 MED ORDER — ENOXAPARIN SODIUM 40 MG/0.4ML IJ SOSY: 40.0000 mg | PREFILLED_SYRINGE | Freq: Every day | INTRAMUSCULAR | Status: DC

## 2023-12-07 MED ORDER — LACTATED RINGERS IV SOLN: INTRAVENOUS | Status: DC

## 2023-12-07 MED ORDER — VANCOMYCIN HCL 1250 MG/250ML IV SOLN
1250.0000 mg | INTRAVENOUS | Status: DC
  Filled 2023-12-07: qty 250

## 2023-12-07 MED ORDER — NALOXONE HCL 0.4 MG/ML IJ SOLN
0.4000 mg | INTRAMUSCULAR | Status: DC | PRN
Start: 2023-12-07 — End: 2024-01-05

## 2023-12-07 MED ORDER — HYDROMORPHONE HCL 1 MG/ML IJ SOLN
0.5000 mg | INTRAMUSCULAR | Status: AC | PRN
  Administered 2023-12-07 (×4): 0.5 mg via INTRAVENOUS
  Filled 2023-12-07 (×3): qty 1
  Filled 2023-12-07: qty 0.5

## 2023-12-07 NOTE — Progress Notes (Signed)
 Pharmacy Antibiotic Note  [name] [name] is a 63 y.o. male admitted on 12/06/2023 with back pain, possible osteomyelitis.  Admitted 3/8-3/21 at [facility] for MRSA bacteremia and possible endocarditis/osteomyelitis --received Vancomycin throughout admission.  Plan was for discharge 3/21 and administration of Dalvancin weekly, but dose not given.  Pharmacy has been consulted for Vancomycin  dosing.  Last dose of Vancomycin 1250 mg given ~ 4 am 3/21  Plan: Vancomycin 1000 mg IV q24h  Weight: 72.6 kg (160 lb 0.9 oz)  Temp (24hrs), Avg:98.1 F (36.7 C), Min:97.6 F (36.4 C), Max:98.6 F (37 C)  Recent Labs  Lab 12/06/23 2028 12/06/23 2103 12/06/23 2359  WBC 11.0*  --   --   CREATININE 1.05  --   --   LATICACIDVEN  --  2.5* 2.3*    CrCl cannot be calculated (Unknown ideal weight.).    Allergies  Allergen Reactions   Fentanyl Itching   Hydromorphone Hcl Itching    Pt  States itching all over Pt  States itching all over Pt  States itching all over    Morphine Itching   Pantoprazole Hives   Pantoprazole Sodium Hives   Nsaids Nausea And Vomiting, Other (See Comments) and Hives    Severe cramping Other reaction(s): Abdominal Pain Cramping per [place]   Morphine And Codeine Itching   Ibuprofen    Ketorolac Other (See Comments)   Pantoprazole Sodium Hives     Eddie Candle 12/07/2023 12:55 AM

## 2023-12-07 NOTE — ED Provider Notes (Incomplete)
 [facility] EMERGENCY DEPARTMENT AT [facility] Provider Note   CSN: [id number] Arrival date & time: 12/06/23  1952     History {Add pertinent medical, surgical, social history, OB history to HPI:1} Chief Complaint  Patient presents with  . Abdominal Pain    [name] [name] is a 63 y.o. male.   Abdominal Pain    Patient was seen by me earlier at EMS triage.  Patient was recently admitted to the hospital at [facility] on March 8.  The patient was released on the 21st, today.  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  Patient had refused MRI.  Patient was evaluated by infectious disease and plan was for 6 weeks of dalbavancin antibiotics.  Patient asked to come to a different medical facility because he is still having pain in his abdomen and his back.  Patient states it is more difficult for him to walk  Home Medications Prior to Admission medications   Medication Sig Start Date End Date Taking? Authorizing Provider  albuterol (PROVENTIL HFA;VENTOLIN HFA) 108 (90 Base) MCG/ACT inhaler Inhale 1-2 puffs into the lungs every 6 (six) hours as needed for wheezing or shortness of breath.  01/13/18   [provider]  gabapentin (NEURONTIN) 300 MG capsule Take 1 capsule (300 mg total) by mouth 3 (three) times daily. 09/10/18   Charm Rings, NP  sertraline (ZOLOFT) 100 MG tablet Take by mouth. 10/17/18 11/16/18  [provider]  traZODone (DESYREL) 50 MG tablet Take by mouth. 10/17/18   [provider]      Allergies    Fentanyl, Hydromorphone hcl, Morphine, Pantoprazole, Pantoprazole sodium, Nsaids, Morphine and codeine, Ibuprofen, Ketorolac, and Pantoprazole sodium    Review of Systems   Review of Systems  Gastrointestinal:  Positive for abdominal pain.    Physical Exam Updated Vital Signs BP (!) 129/93   Pulse 91   Temp 98.6 F (37 C) (Oral)   Resp  (!) 31   Wt 72.6 kg   SpO2 98%   BMI 21.12 kg/m  Physical Exam Vitals and nursing note reviewed.  Constitutional:      Appearance: He is well-developed. He is not diaphoretic.  HENT:     Head: Normocephalic and atraumatic.     Right Ear: External ear normal.     Left Ear: External ear normal.  Eyes:     General: No scleral icterus.       Right eye: No discharge.        Left eye: No discharge.     Conjunctiva/sclera: Conjunctivae normal.  Neck:     Trachea: No tracheal deviation.  Cardiovascular:     Rate and Rhythm: Normal rate and regular rhythm.  Pulmonary:     Effort: Pulmonary effort is normal. No respiratory distress.     Breath sounds: Normal breath sounds. No stridor. No wheezing or rales.  Abdominal:     General: Bowel sounds are normal. There is no distension.     Palpations: Abdomen is soft.     Tenderness: There is generalized abdominal tenderness. There is no guarding or rebound.  Musculoskeletal:        General: No tenderness or deformity.     Cervical back: Neck supple.  Skin:    General: Skin is warm and dry.     Findings: No rash.  Neurological:     General: No focal deficit present.  Mental Status: He is alert.     Cranial Nerves: No cranial nerve deficit, dysarthria or facial asymmetry.     Sensory: No sensory deficit.     Motor: No abnormal muscle tone or seizure activity.     Coordination: Coordination normal.  Psychiatric:        Mood and Affect: Mood normal.     ED Results / Procedures / Treatments   Labs (all labs ordered are listed, but only abnormal results are displayed) Labs Reviewed  COMPREHENSIVE METABOLIC PANEL - Abnormal; Notable for the following components:      Result Value   Sodium 130 (*)    Chloride 96 (*)    CO2 19 (*)    Total Protein 8.4 (*)    Albumin 2.3 (*)    All other components within normal limits  CBC WITH DIFFERENTIAL/PLATELET - Abnormal; Notable for the following components:   WBC 11.0 (*)    RBC 4.07  (*)    Hemoglobin 11.8 (*)    HCT 36.6 (*)    RDW 16.2 (*)    Platelets 432 (*)    Neutro Abs 9.1 (*)    All other components within normal limits  PROTIME-INR - Abnormal; Notable for the following components:   Prothrombin Time 19.2 (*)    INR 1.6 (*)    All other components within normal limits  I-STAT CG4 LACTIC ACID, ED - Abnormal; Notable for the following components:   Lactic Acid, Venous 2.5 (*)    All other components within normal limits  CULTURE, BLOOD (ROUTINE X 2)  CULTURE, BLOOD (ROUTINE X 2)  LIPASE, BLOOD  URINALYSIS, W/ REFLEX TO CULTURE (INFECTION SUSPECTED)  I-STAT CG4 LACTIC ACID, ED  TROPONIN I (HIGH SENSITIVITY)  TROPONIN I (HIGH SENSITIVITY)    EKG EKG Interpretation Date/Time:  Friday December 06 2023 22:07:55 EDT Ventricular Rate:  95 PR Interval:  166 QRS Duration:  98 QT Interval:  387 QTC Calculation: 487 R Axis:   2  Text Interpretation: Sinus rhythm Ventricular premature complex Consider left atrial enlargement LVH with secondary repolarization abnormality Borderline prolonged QT interval Since last tracing rate slower Confirmed by [name] [id number]) on 12/06/2023 10:11:21 PM  Radiology CT L-SPINE NO CHARGE Result Date: 12/06/2023 CLINICAL DATA:  Acute nonlocalized abdominal pain EXAM: CT LUMBAR SPINE WITHOUT CONTRAST TECHNIQUE: Multidetector CT imaging of the lumbar spine was performed without intravenous contrast administration. Multiplanar CT image reconstructions were also generated. RADIATION DOSE REDUCTION: This exam was performed according to the departmental dose-optimization program which includes automated exposure control, adjustment of the mA and/or kV according to patient size and/or use of iterative reconstruction technique. COMPARISON:  CT abdomen and pelvis 05/04/2018 FINDINGS: Segmentation: 5 lumbar type vertebral bodies. Alignment: Normal alignment. Vertebrae: Compression of the L2 vertebra with about 40% loss of height. This is new  since prior study. No retropulsion of fracture fragments. Compression of the superior endplate of L3 with about 20% loss of height. This is new since the prior study. No retropulsion of fracture fragments. Compression of the superior endplate of L5 is unchanged since prior study. No destructive or expansile bone lesions. Paraspinal and other soft tissues: See additional report of CT abdomen and pelvis same date. Disc levels: Intervertebral disc space heights are mostly preserved. Mild endplate osteophyte formation. IMPRESSION: Compression deformities of L2 and L3, new since prior study. Acute compression is not excluded. No retropulsion of fracture fragments. Electronically Signed   By: Burman Nieves M.D.   On: 12/06/2023  23:53   CT ABDOMEN PELVIS W CONTRAST Result Date: 12/06/2023 CLINICAL DATA:  Acute nonlocalized abdominal pain. EXAM: CT ABDOMEN AND PELVIS WITH CONTRAST TECHNIQUE: Multidetector CT imaging of the abdomen and pelvis was performed using the standard protocol following bolus administration of intravenous contrast. RADIATION DOSE REDUCTION: This exam was performed according to the departmental dose-optimization program which includes automated exposure control, adjustment of the mA and/or kV according to patient size and/or use of iterative reconstruction technique. CONTRAST:  75mL OMNIPAQUE IOHEXOL 350 MG/ML SOLN COMPARISON:  05/04/2018 FINDINGS: Lower chest: Emphysematous changes in the lung bases. Bronchiectasis and bronchial wall thickening with evidence of mucous plugging likely chronic bronchitis. Trace bilateral pleural effusions with basilar atelectasis. Prominent coronary artery calcifications. Hepatobiliary: No focal liver abnormality is seen. No gallstones, gallbladder wall thickening, or biliary dilatation. Pancreas: Unremarkable. No pancreatic ductal dilatation or surrounding inflammatory changes. Spleen: Normal in size without focal abnormality. Adrenals/Urinary Tract: No adrenal  gland nodules. Chronic scarring in the kidneys. Heterogeneous nephrograms bilaterally suggest evidence of acute pyelonephritis. No renal or ureteral obstruction. Visualized bladder is unremarkable. Portions of the bladder and pelvis are not visualized due to streak artifact from right hip arthroplasty. Stomach/Bowel: Stomach, small bowel, and colon are not abnormally distended. Stool fills the colon. There is a moderate-sized right inguinal hernia containing bowel but without obvious proximal obstruction. Visualization is limited due to the streak artifact. Vascular/Lymphatic: Calcification of the aorta. No aneurysm. Inferior vena caval filter. No significant lymphadenopathy. Reproductive: Prostate gland is mildly enlarged. Other: Small amount of free fluid along the pericolic gutters is nonspecific. No free air. 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 This is progressing since the previous study. Heterotopic ossification. Compression deformities of T12, L2, L3, and L5 demonstrate progression since the previous study. Acute compression is not excluded. Degenerative changes in the spine. Old rib fractures. IMPRESSION: 1. Emphysematous changes and chronic bronchitic changes in the lung bases. Trace bilateral pleural effusions. 2. Heterogeneous nephrograms bilaterally suggesting acute pyelonephritis. No abscess or hydronephrosis. 3. Moderate-sized right inguinal hernia containing bowel without obvious proximal obstruction. Visualization is limited due to streak artifact. 4. Aortic atherosclerosis.  Inferior vena caval filter. 5. Small amount of free fluid in the abdomen of nonspecific etiology. Electronically Signed   By: Burman Nieves M.D.   On: 12/06/2023 23:50    Procedures Procedures  {Document cardiac monitor, telemetry assessment procedure when appropriate:1}  Medications Ordered in ED Medications  acetaminophen (TYLENOL) tablet 650  mg (650 mg Oral Given 12/06/23 2057)  sodium chloride 0.9 % bolus 1,000 mL (0 mLs Intravenous Stopped 12/06/23 2317)  oxyCODONE (Oxy IR/ROXICODONE) immediate release tablet 10 mg (10 mg Oral Given 12/06/23 2202)  iohexol (OMNIPAQUE) 350 MG/ML injection 75 mL (75 mLs Intravenous Contrast Given 12/06/23 2337)    ED Course/ Medical Decision Making/ A&P Clinical Course as of 12/07/23 0001  Fri Dec 06, 2023  2143 CBC with Differential(!) CBC shows elevated white blood cell count metabolic panel shows hyponatremia [JK]  2143 Troponin I (High Sensitivity) Troponin [JK]  2143 I-Stat Lactic Acid, ED(!!) Lactic acid level elevated [JK]  2149 Patient states he was able to tolerate oxycodone at [facility] [JK]  2209 Noted from [facility] indicate pt refusing MRI assessment [JK]  2357 CT scan shows emphysematous changes in the lungs.  Heterogeneous nephrograms are suggesting possible acute pyelonephritis.  No abscess or hydronephrosis. [JK]  2357 Patient has compression deformities of T12 L2-L3 and L5.  Progressed since  previous studies.  Acute compression is not excluded [JK]    Clinical Course User Index [JK] [name], MD   {   Click here for ABCD2, HEART and other calculatorsREFRESH Note before signing :1}                              Medical Decision Making Problems Addressed: Acute midline back pain, unspecified back location: chronic illness or injury with exacerbation, progression, or side effects of treatment Compression fracture of lumbar vertebra, unspecified lumbar vertebral level, initial encounter [facility]): chronic illness or injury with exacerbation, progression, or side effects of treatment Lactic acidosis: acute illness or injury that poses a threat to life or bodily functions  Amount and/or Complexity of Data Reviewed Labs: ordered. Decision-making details documented in ED Course. Radiology: ordered.  Risk OTC drugs. Prescription drug management.     {Document  critical care time when appropriate:1} {Document review of labs and clinical decision tools ie heart score, Chads2Vasc2 etc:1}  {Document your independent review of radiology images, and any outside records:1} {Document your discussion with family members, caretakers, and with consultants:1} {Document social determinants of health affecting pt's care:1} {Document your decision making why or why not admission, treatments were needed:1} Final Clinical Impression(s) / ED Diagnoses Final diagnoses:  None    Rx / DC Orders ED Discharge Orders     None

## 2023-12-07 NOTE — ED Provider Notes (Signed)
 Admission d/w Dr. Margo Aye.  Plan admission is recommended and IV fluids, IV antibiotics in the setting of sepsis and MRSA bacteremia.  Did not complete bacteremia workup with MRI or TEE. Concern for osteomyelitis.  Patient does agree to MRI   [name] Octave, MD 12/07/23 [phone]

## 2023-12-07 NOTE — H&P (Signed)
 History and Physical  Aceson Labell ZOX:[id number] DOB: 05/29/1961 DOA: 12/06/2023  Referring physician: Dr. Manus Gunning, EDP  PCP: Patient, No Pcp Per  Outpatient Specialists: Cardiology, ID Patient coming from: Home  Chief Complaint: Lower back pain  HPI: [name] [name] is a 63 y.o. 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  In the ER, tachypneic with mild leukocytosis and lactic acidosis.  The patient does not want to return to previous facility and wants to be admitted at [facility].  He is willing to do further workup for his MRSA bacteremia.  The patient was started on IV vancomycin.  He received gentle IV fluid hydration 50 cc/h x 8 hours.  MRI lumbar spine ordered and is pending.  Admitted by [facility], hospitalist service.  ED Course: Temperature 98.1.  BP 122/78, pulse 89, respiratory rate 15, O2 saturation 100% on room air.  Lab studies notable for WBC 11.0, hemoglobin 11.8, platelet count 432.  Lactic acid 2.5, 2.3.  Serum sodium 130, serum bicarb 19.  Review of Systems: Review of systems as noted in the HPI. All other systems reviewed and are negative.   Past Medical History:  Diagnosis Date   Alcoholic (HCC)    Anxiety    COPD (chronic obstructive pulmonary disease) (HCC)    Depression    Hepatitis C    History of blood clots    Homelessness    Malingering    Peripheral neuropathy    Polysubstance abuse (HCC)    Past Surgical History:  Procedure Laterality Date   ABDOMINAL SURGERY     HIP SURGERY      Social History:  reports that he has been smoking cigarettes. He has a 7.5 pack-year smoking history. He has never used smokeless  tobacco. He reports current alcohol use. He reports that he does not currently use drugs after having used the following drugs: Cocaine and Marijuana.   Allergies  Allergen Reactions   Fentanyl Itching   Hydromorphone Hcl Itching    Pt  States itching all over Pt  States itching all over Pt  States itching all over    Morphine Itching   Pantoprazole Hives   Pantoprazole Sodium Hives   Nsaids Nausea And Vomiting, Other (See Comments) and Hives    Severe cramping Other reaction(s): Abdominal Pain Cramping per [place]   Morphine And Codeine Itching   Ibuprofen    Ketorolac Other (See Comments)   Pantoprazole Sodium Hives   Family history: None reported  Prior to Admission medications   Medication Sig Start Date End Date Taking? Authorizing Provider  albuterol (PROVENTIL HFA;VENTOLIN HFA) 108 (90 Base) MCG/ACT inhaler Inhale 1-2 puffs into the lungs every 6 (six) hours as needed for wheezing or shortness of breath.  01/13/18   [provider]  gabapentin (NEURONTIN) 300 MG capsule Take 1 capsule (300 mg total) by mouth 3 (three) times daily. 09/10/18   Charm Rings, NP  sertraline (ZOLOFT) 100 MG tablet Take by mouth. 10/17/18 11/16/18  [provider]  traZODone (DESYREL) 50 MG tablet Take by mouth. 10/17/18   [provider]    Physical Exam: BP 124/74   Pulse 91   Temp 98.6 F (37 C) (Oral)  Resp 20   Wt 72.6 kg   SpO2 100%   BMI 21.12 kg/m   General: 63 y.o. year-old male frail-appearing in no acute distress.  Alert and oriented x3.  Appears uncomfortable due to lower back pain. Cardiovascular: Regular rate and rhythm with no rubs or gallops.  No thyromegaly or JVD noted.  No lower extremity edema. 2/4 pulses in all 4 extremities. Respiratory: Clear to auscultation with no wheezes or rales. Good inspiratory effort. Abdomen: Soft nontender nondistended with normal bowel sounds x4 quadrants. Muskuloskeletal: No cyanosis, clubbing or edema  noted bilaterally Neuro: CN II-XII intact, strength, sensation, reflexes Skin: No ulcerative lesions noted or rashes Psychiatry: Judgement and insight appear normal. Mood is appropriate for condition and setting          Labs on Admission:  Basic Metabolic Panel: Recent Labs  Lab 12/06/23 2028  NA 130*  K 4.2  CL 96*  CO2 19*  GLUCOSE 80  BUN 14  CREATININE 1.05  CALCIUM 9.1   Liver Function Tests: Recent Labs  Lab 12/06/23 2028  AST 31  ALT 27  ALKPHOS 70  BILITOT 0.8  PROT 8.4*  ALBUMIN 2.3*   Recent Labs  Lab 12/06/23 2028  LIPASE 35   No results for input(s): "AMMONIA" in the last 168 hours. CBC: Recent Labs  Lab 12/06/23 2028  WBC 11.0*  NEUTROABS 9.1*  HGB 11.8*  HCT 36.6*  MCV 89.9  PLT 432*   Cardiac Enzymes: No results for input(s): "CKTOTAL", "CKMB", "CKMBINDEX", "TROPONINI" in the last 168 hours.  BNP (last 3 results) No results for input(s): "BNP" in the last 8760 hours.  ProBNP (last 3 results) No results for input(s): "PROBNP" in the last 8760 hours.  CBG: No results for input(s): "GLUCAP" in the last 168 hours.  Radiological Exams on Admission: DG Chest 2 View Result Date: 12/07/2023 CLINICAL DATA:  Suspected Sepsis abdominal pain EXAM: CHEST - 2 VIEW COMPARISON:  Chest x-ray 04/16/2022 FINDINGS: The heart and mediastinal contours are within normal limits. No focal consolidation. Chronic coarsened interstitial markings with no overt pulmonary edema. No pleural effusion. No pneumothorax. No acute osseous abnormality. Degenerative changes of the shoulders. IMPRESSION: No active cardiopulmonary disease. Electronically Signed   By: Tish Frederickson M.D.   On: 12/07/2023 00:32   CT L-SPINE NO CHARGE Result Date: 12/06/2023 CLINICAL DATA:  Acute nonlocalized abdominal pain EXAM: CT LUMBAR SPINE WITHOUT CONTRAST TECHNIQUE: Multidetector CT imaging of the lumbar spine was performed without intravenous contrast administration. Multiplanar CT  image reconstructions were also generated. RADIATION DOSE REDUCTION: This exam was performed according to the departmental dose-optimization program which includes automated exposure control, adjustment of the mA and/or kV according to patient size and/or use of iterative reconstruction technique. COMPARISON:  CT abdomen and pelvis 05/04/2018 FINDINGS: Segmentation: 5 lumbar type vertebral bodies. Alignment: Normal alignment. Vertebrae: Compression of the L2 vertebra with about 40% loss of height. This is new since prior study. No retropulsion of fracture fragments. Compression of the superior endplate of L3 with about 20% loss of height. This is new since the prior study. No retropulsion of fracture fragments. Compression of the superior endplate of L5 is unchanged since prior study. No destructive or expansile bone lesions. Paraspinal and other soft tissues: See additional report of CT abdomen and pelvis same date. Disc levels: Intervertebral disc space heights are mostly preserved. Mild endplate osteophyte formation. IMPRESSION: Compression deformities of L2 and L3, new since prior study. Acute compression is not excluded. No retropulsion of  fracture fragments. Electronically Signed   By: Burman Nieves M.D.   On: 12/06/2023 23:53   CT ABDOMEN PELVIS W CONTRAST Result Date: 12/06/2023 CLINICAL DATA:  Acute nonlocalized abdominal pain. EXAM: CT ABDOMEN AND PELVIS WITH CONTRAST TECHNIQUE: Multidetector CT imaging of the abdomen and pelvis was performed using the standard protocol following bolus administration of intravenous contrast. RADIATION DOSE REDUCTION: This exam was performed according to the departmental dose-optimization program which includes automated exposure control, adjustment of the mA and/or kV according to patient size and/or use of iterative reconstruction technique. CONTRAST:  75mL OMNIPAQUE IOHEXOL 350 MG/ML SOLN COMPARISON:  05/04/2018 FINDINGS: Lower chest: Emphysematous changes in the  lung bases. Bronchiectasis and bronchial wall thickening with evidence of mucous plugging likely chronic bronchitis. Trace bilateral pleural effusions with basilar atelectasis. Prominent coronary artery calcifications. Hepatobiliary: No focal liver abnormality is seen. No gallstones, gallbladder wall thickening, or biliary dilatation. Pancreas: Unremarkable. No pancreatic ductal dilatation or surrounding inflammatory changes. Spleen: Normal in size without focal abnormality. Adrenals/Urinary Tract: No adrenal gland nodules. Chronic scarring in the kidneys. Heterogeneous nephrograms bilaterally suggest evidence of acute pyelonephritis. No renal or ureteral obstruction. Visualized bladder is unremarkable. Portions of the bladder and pelvis are not visualized due to streak artifact from right hip arthroplasty. Stomach/Bowel: Stomach, small bowel, and colon are not abnormally distended. Stool fills the colon. There is a moderate-sized right inguinal hernia containing bowel but without obvious proximal obstruction. Visualization is limited due to the streak artifact. Vascular/Lymphatic: Calcification of the aorta. No aneurysm. Inferior vena caval filter. No significant lymphadenopathy. Reproductive: Prostate gland is mildly enlarged. Other: Small amount of free fluid along the pericolic gutters is nonspecific. No free air. 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 This is progressing since the previous study. Heterotopic ossification. Compression deformities of T12, L2, L3, and L5 demonstrate progression since the previous study. Acute compression is not excluded. Degenerative changes in the spine. Old rib fractures. IMPRESSION: 1. Emphysematous changes and chronic bronchitic changes in the lung bases. Trace bilateral pleural effusions. 2. Heterogeneous nephrograms bilaterally suggesting acute pyelonephritis. No abscess or hydronephrosis. 3.  Moderate-sized right inguinal hernia containing bowel without obvious proximal obstruction. Visualization is limited due to streak artifact. 4. Aortic atherosclerosis.  Inferior vena caval filter. 5. Small amount of free fluid in the abdomen of nonspecific etiology. Electronically Signed   By: Burman Nieves M.D.   On: 12/06/2023 23:50    EKG: I independently viewed the EKG done and my findings are as followed: Sinus rhythm rate of 95.  Nonspecific ST-T changes.  TC 487.  Assessment/Plan Present on Admission:  MRSA bacteremia  Principal Problem:   MRSA bacteremia  MRSA bacteremia, POA TTE ([facility]) revealed LVEF 20-25%, tricuspid valve appears thickened with possible mobile component, recommended TEE to better assess given the indication for the study Consider cardiology consultation in the morning for TEE. Consider infectious disease consultation in the morning to assist with management. Follow peripheral blood cultures x 2 Monitor fever curve and WBCs On IV vancomycin, continue Gentle IV fluid hydration LR 50 cc/h x 8 hours.  Lower back pain, compression fractures in lumbar spine vertebrae, T12, L2-L3 L5 Rule out osteomyelitis Follow MRI lumbar spine As needed analgesics and bowel regimen  Lactic acidosis Gentle IV fluid hydration Trend lactic acid  Chronic HFrEF LVEF 20 to 25% Euvolemic on exam Closely monitor volume status while on gentle IV fluid hydration Start strict I's and O's and daily weight  Possible acute pyelonephritis seen on CT scan, POA Continue IV antibiotics As needed analgesics  History of PE/DVT on Eliquis Resume home Eliquis  History of polysubstance abuse Denies recent use of IV drugs  Tobacco use disorder Current smoker Nicotine patch  Generalized weakness PT OT assessment Fall precautions  Severe protein calorie malnutrition Severe muscle mass loss Albumin 2.3 Dietitian consult   Critical care time: 65 minutes   DVT  prophylaxis: Home Eliquis  Code Status: Full code.  Family Communication: None at bedside.  Disposition Plan: Admitted to progressive care unit.  Consults called: None.  Admission status: Inpatient status.   Status is: Inpatient The patient requires at least 2 midnights for further evaluation and treatment of present condition.   Darlin Drop MD [facility] Pager [phone]  If 7PM-7AM, please contact night-coverage [email or web address] Password [facility]  12/07/2023, 12:44 AM

## 2023-12-07 NOTE — ED Notes (Signed)
 This RN continues to educate and place pt on tele monitor leads, pt continues to pull them off.

## 2023-12-07 NOTE — Plan of Care (Signed)

## 2023-12-07 NOTE — Progress Notes (Signed)
 Same day note   [name] [name] is a 63 y.o. 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  In the ED patient was tachypneic with leukocytosis and lactic acidosis.  He did not wish to go back to the treatment wanted to pursue a workup here in the hospital.  In the ED patient was afebrile.  WBC was elevated at 11.0.  Lactic acid elevated at 2.5.  CT scan of the abdomen and pelvis showed emphysematous changes and chronic bronchitis changes in the lungs with heterogeneous nephrogram suggestive of bilateral pyelonephritis no abscess or hydronephrosis.  CT lumbar spine showed compression deformity of L2-L3.  Chest x-ray did not show any acute findings.  MRI lumbar spine ordered and patient was then admitted hospital for further evaluation treatment.    Patient seen and examined at bedside.  Patient was admitted to the hospital for severe back pain.  At the time of my evaluation, patient complains of back pain, nervousness and tremors.  Physical examination reveals thinly built male, Communicative, tenderness of the back.  Laboratory data and imaging was reviewed  Assessment and Plan.  MRSA bacteremia, POA TTE ([facility]) revealed LVEF 20-25%, tricuspid valve appears thickened with possible mobile component, recommended TEE to better assess given the indication for the study.  At [facility] TEE was not done safely due to patient is agitated and unable to lie down quietly.  Patient had refused MRI of the back multiple times.  Will consult cardiology for TEE.  Consult ID in AM..  Follow blood cultures.  On IV vancomycin will continue.  Continue hydration.   Review of [facility] record discharge summary indicate that plan was to proceed with 6 weeks of antibiotics with ID with plan to give 1 dose of dalbavancin on 3/21 and another on 3/28.   Lower back pain, compression fractures in lumbar spine vertebrae, T12, L2-L3 L5 MRI of the spine shows chronic compression fracture of T12 L2-L3 and L5 with no obvious signs of infection.  Continue supportive care.  Lactic acidosis Continue IV hydration.   Chronic HFrEF LVEF 20 to 25%, currently compensated.  Continue intake and output, charting Daily weights.  Possible acute pyelonephritis seen on CT scan, POA Continue IV antibiotics, analgesics. As needed analgesics   History of PE/DVT on Eliquis Continue Eliquis.   History of polysubstance abuse No recent use of IV drugs.   Tobacco use disorder Continue nicotine patch.  Current smoker.   Generalized weakness Continue PT OT.  Fall precautions.  Severe protein calorie malnutrition Body mass index is 18.34 kg/m.  Low albumin.  Will get nutrition evaluation.  Homelessness poor social support.  Will get TOC consultation.  No Charge  Signed,  Tenny Craw, MD [facility]

## 2023-12-07 NOTE — Progress Notes (Signed)
 [facility] night cross cover note:   I was notified by RN that the patient is reporting ongoing back pain that is refractory to existing order for oxycodone 10 mg p.o. every 4 hours as needed.  The patient conveys that the associated pain control was improved while he was on prn IV Dilaudid, and requests resumption of the latter at this time.  I subsequently ordered Dilaudid 0.5 mg IV every 2 hours as needed for pain.    Newton Pigg, DO Hospitalist

## 2023-12-07 NOTE — Progress Notes (Signed)
 Pharmacy Antibiotic Note  Jareth Pardee is a 63 y.o. male admitted on 12/06/2023 with back pain, possible osteomyelitis.  Admitted 3/8-3/21 at [facility] for MRSA bacteremia and possible endocarditis/osteomyelitis --received Vancomycin throughout admission.  Plan was for discharge 3/21 and administration of Dalvancin weekly, but dose not given.  Pharmacy has been consulted for Vancomycin  dosing.  Last dose of Vancomycin 1250 mg given ~ 4 am 3/21.   Per documentation last levels on 3/19 on 750mg  q24h with trough of 283 (trough: 7.2 and peak 16.2), adjusted regimen to 1250mg  q24h for eAUC ~480. Scr around 0.8 during this time.   Plan: Adjust vancomcyin to 1250mg  q24h based on OSH levels.  Trough and peak prior to fourth dose or sooner if clinically indicated.  Follow culture data for de-escalation.  Monitor renal function for dose adjustments as indicated.   Height: 6\' 1"  (185.4 cm) Weight: 63 kg (139 lb) IBW/kg (Calculated) : 79.9  Temp (24hrs), Avg:98.2 F (36.8 C), Min:97.6 F (36.4 C), Max:98.6 F (37 C)  Recent Labs  Lab 12/06/23 2028 12/06/23 2103 12/06/23 2359 12/07/23 0455 12/07/23 0637 12/07/23 0641  WBC 11.0*  --   --  7.8  --   --   CREATININE 1.05  --   --   --   --  0.90  LATICACIDVEN  --  2.5* 2.3*  --  1.8  --     Estimated Creatinine Clearance: 75 mL/min (by C-G formula based on SCr of 0.9 mg/dL).    Allergies  Allergen Reactions   Fentanyl Itching   Hydromorphone Hcl Itching    Pt  States itching all over Pt  States itching all over Pt  States itching all over    Morphine Itching   Pantoprazole Hives   Pantoprazole Sodium Hives   Haloperidol Other (See Comments)    Pt reports he has "seizures" had haldol on 09/25/21 with no apparent ill effect.  Pt reports he has "seizures"   Nsaids Nausea And Vomiting, Other (See Comments) and Hives    Severe cramping Other reaction(s): Abdominal Pain Cramping per [place]   Morphine And Codeine  Itching   Ibuprofen    Ketorolac Other (See Comments)   Pantoprazole Sodium Hives    Estill Batten, PharmD, BCCCP  12/07/2023 11:02 AM

## 2023-12-07 NOTE — Hospital Course (Addendum)
 [name] [name] is a 63 y.o. male with a history of chronic HFrEF, VT on Eliquis , IV drug use, COPD, chronic HCV, MRSA bacteremia and endocarditis.  Recently admitted to [facility] for his MRSA bacteremia with endocarditis and per report, patient was uncooperative during the hospitalization with and was refusing workup/treatments; patient was given 1 dose of dalbavancin prior to discharge and presenting at [facility] for evaluation and management of his infection. ID, neurosurgery, cardiology, orthopedic surgery were consulted due to presentation with septic arthritis of the right hip as well as C2-C3 osteomyelitis with possible septic arthritis. patient is currently managed on vancomycin  IV. No surgical management for now recommended.  PICC line placed this admission for prolonged IV antibiotics. Threatened to leave AMA on 04/16 and 4/18.   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  Evaluated by neurosurgery -Dr. Nat Badger recommended medical management.  TEE deferred secondary to history of esophageal stricture.  TTE EF 20 to 25%.  Tricuspid valve thickening With possible mobile components seen on echocardiogram in outside facility on 11/27/2023. no vegetation mentioned on echo performed here. Orthopedic surgery consulted- "His imaging was reviewed by multiple joint replacement surgeons. 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 It's possible he's a candidate for some sort of pelvic reconstruction surgery but it's nothing anyone in this community does. Recommend transfer to tertiary center for further consideration. " Patient has been reluctant to be transferred.  May not be a candidate given history of IVDU as well as cardiomyopathy as well. PICC placed on 3/24  for prolonged IV antibiotics. Not a candidate for home antibiotic given history of IVDU. Zyvox , Cipro, Bactrim appears to be an oral alternative.  Continue Vancomycin  IV; monitor vancomycin  levels Ideally will require an MRI C-spine and hip when nearing end of therapy.  Will also require ID reconsultation, when nearing end of therapy.  Chronic T12,L2, L3, L5 compression fractures Continue pain management   Chronic HFrEF Prolonged history.  Currently euvolemic. Cardiology recommending Aldactone , Coreg , low-dose losartan.  No SGLT2 inhibitor given risk of infection.   On Lasix .   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  Appears to be provoked event in the setting of right knee fracture in 2012.  Had IVC filter placed as well. IVC filter still in placed based on CT 11/2023. Has been on Eliquis  chronically. Last fill was 08/2023 for 90 days.   SIADH-hyponatremia Serum osm 275.  Urine osmolality 373. Urine sodium 71. Currently stable at around 129.  Asymptomatic.  Continue fluid restriction and Lasix .   Chronic hepatitis C infection HIV negative.  HBsAg nonreactive.  Hep B S AB Quant 3,959.0 (Immunity>10 mIU/mL) HEP B CORE AB Positive HCV RNA 11,800,000 ID recommending outpatient follow-up.   Polysubstance abuse H/o IVDU Recent UDS significant for amphetamines and cocaine. Patient counseled to stop. Patient will need PICC line removed prior to discharge from the hospital.   Tobacco use Noted.   Right inguinal hernia Noted on CT incidentally. No evidence of obstruction or incarceration.   Severe protein calorie malnutrition  Underweight Body mass index is 16.87 kg/m.  Dietitian following.  Continue supplements. Placing the patient at high risk of poor outcome.  Certainly adding to his poor surgical candidacy.  Anemia of nutritional deficiency as well as chronic disease In the setting of malnutrition with osteomyelitis Continue  supplement.  Hemoglobin  stable.  Gout. Synovial fluid analysis along with growing MRSA also showed intracellular monosodium urate crystals. Will initiate colchicine .  No renal or hepatic on indication so far.  Pain management. Patient reports uncontrolled pain.  Yet His vital signs are stable.  Patient has chronic appearing compression fractures, chronic right hip pain based on the chart going back to 2019 and currently being treated for MRSA bacteremia with septic arthritis. Does have history of substance use disorder including history of IVDU. Wants to use Dilaudid  only for his pain control.  Will switch Oxy IR to Dilaudid .  Will lower the dose. Add scheduled Tylenol . Add as needed Robaxin . Patient told me that he is not interested in nonnarcotic medications for his pain control.  Depression. Known history. Multiple admissions with suicidal ideation in the past. Has been on Lexapro  20 mg daily. His hyponatremia does not appear to be related to the medication therefore we will resume this medication for now. Also on Vistaril  chronically which I will resume.  Compliance issues. Patient has history of leaving AMA multiple times without completing therapy in the hospital. Threatened to leave AMA again on 4/16 and on 4/18. Concern if he is compliant with his long-term medications for medical issues. Certainly places him at a high risk for poor outcome for major interventions and readmission. Not a candidate for off unit privileges off campus with PICC line.

## 2023-12-08 ENCOUNTER — Inpatient Hospital Stay (HOSPITAL_COMMUNITY): Payer: MEDICAID

## 2023-12-08 ENCOUNTER — Encounter (HOSPITAL_COMMUNITY): Payer: Self-pay | Admitting: Internal Medicine

## 2023-12-08 ENCOUNTER — Other Ambulatory Visit: Payer: Self-pay

## 2023-12-08 DIAGNOSIS — S32000A Wedge compression fracture of unspecified lumbar vertebra, initial encounter for closed fracture: Secondary | ICD-10-CM

## 2023-12-08 DIAGNOSIS — M549 Dorsalgia, unspecified: Secondary | ICD-10-CM | POA: Diagnosis not present

## 2023-12-08 DIAGNOSIS — I5022 Chronic systolic (congestive) heart failure: Secondary | ICD-10-CM

## 2023-12-08 DIAGNOSIS — I339 Acute and subacute endocarditis, unspecified: Secondary | ICD-10-CM

## 2023-12-08 DIAGNOSIS — I76 Septic arterial embolism: Secondary | ICD-10-CM

## 2023-12-08 DIAGNOSIS — I2510 Atherosclerotic heart disease of native coronary artery without angina pectoris: Secondary | ICD-10-CM | POA: Insufficient documentation

## 2023-12-08 DIAGNOSIS — I5042 Chronic combined systolic (congestive) and diastolic (congestive) heart failure: Secondary | ICD-10-CM | POA: Insufficient documentation

## 2023-12-08 DIAGNOSIS — R519 Headache, unspecified: Secondary | ICD-10-CM

## 2023-12-08 DIAGNOSIS — I079 Rheumatic tricuspid valve disease, unspecified: Secondary | ICD-10-CM | POA: Diagnosis not present

## 2023-12-08 DIAGNOSIS — Z96649 Presence of unspecified artificial hip joint: Secondary | ICD-10-CM

## 2023-12-08 DIAGNOSIS — Z86718 Personal history of other venous thrombosis and embolism: Secondary | ICD-10-CM

## 2023-12-08 DIAGNOSIS — R64 Cachexia: Secondary | ICD-10-CM

## 2023-12-08 DIAGNOSIS — I255 Ischemic cardiomyopathy: Secondary | ICD-10-CM | POA: Diagnosis not present

## 2023-12-08 DIAGNOSIS — B1920 Unspecified viral hepatitis C without hepatic coma: Secondary | ICD-10-CM

## 2023-12-08 DIAGNOSIS — B9562 Methicillin resistant Staphylococcus aureus infection as the cause of diseases classified elsewhere: Secondary | ICD-10-CM | POA: Diagnosis not present

## 2023-12-08 DIAGNOSIS — J449 Chronic obstructive pulmonary disease, unspecified: Secondary | ICD-10-CM

## 2023-12-08 DIAGNOSIS — S22000A Wedge compression fracture of unspecified thoracic vertebra, initial encounter for closed fracture: Secondary | ICD-10-CM

## 2023-12-08 DIAGNOSIS — F17210 Nicotine dependence, cigarettes, uncomplicated: Secondary | ICD-10-CM

## 2023-12-08 DIAGNOSIS — M86351 Chronic multifocal osteomyelitis, right femur: Secondary | ICD-10-CM

## 2023-12-08 DIAGNOSIS — R7881 Bacteremia: Secondary | ICD-10-CM | POA: Diagnosis not present

## 2023-12-08 DIAGNOSIS — N19 Unspecified kidney failure: Secondary | ICD-10-CM

## 2023-12-08 DIAGNOSIS — M542 Cervicalgia: Secondary | ICD-10-CM

## 2023-12-08 DIAGNOSIS — F1990 Other psychoactive substance use, unspecified, uncomplicated: Secondary | ICD-10-CM

## 2023-12-08 DIAGNOSIS — Z86711 Personal history of pulmonary embolism: Secondary | ICD-10-CM

## 2023-12-08 DIAGNOSIS — Z5900 Homelessness unspecified: Secondary | ICD-10-CM

## 2023-12-08 DIAGNOSIS — E46 Unspecified protein-calorie malnutrition: Secondary | ICD-10-CM

## 2023-12-08 DIAGNOSIS — J439 Emphysema, unspecified: Secondary | ICD-10-CM

## 2023-12-08 LAB — URINALYSIS, W/ REFLEX TO CULTURE (INFECTION SUSPECTED)
Bilirubin Urine: NEGATIVE
Glucose, UA: NEGATIVE mg/dL
Hgb urine dipstick: NEGATIVE
Ketones, ur: NEGATIVE mg/dL
Leukocytes,Ua: NEGATIVE
Nitrite: NEGATIVE
Protein, ur: NEGATIVE mg/dL
RBC / HPF: NONE SEEN RBC/hpf (ref 0–5)
Specific Gravity, Urine: 1.01 (ref 1.005–1.030)
pH: 6 (ref 5.0–8.0)

## 2023-12-08 LAB — CBC WITH DIFFERENTIAL/PLATELET
Abs Immature Granulocytes: 0.06 10*3/uL (ref 0.00–0.07)
Basophils Absolute: 0 10*3/uL (ref 0.0–0.1)
Basophils Relative: 1 %
Eosinophils Absolute: 0.1 10*3/uL (ref 0.0–0.5)
Eosinophils Relative: 1 %
HCT: 32.2 % — ABNORMAL LOW (ref 39.0–52.0)
Hemoglobin: 10.7 g/dL — ABNORMAL LOW (ref 13.0–17.0)
Immature Granulocytes: 1 %
Lymphocytes Relative: 11 %
Lymphs Abs: 0.9 10*3/uL (ref 0.7–4.0)
MCH: 28.5 pg (ref 26.0–34.0)
MCHC: 33.2 g/dL (ref 30.0–36.0)
MCV: 85.9 fL (ref 80.0–100.0)
Monocytes Absolute: 0.8 10*3/uL (ref 0.1–1.0)
Monocytes Relative: 10 %
Neutro Abs: 6 10*3/uL (ref 1.7–7.7)
Neutrophils Relative %: 76 %
Platelets: 436 10*3/uL — ABNORMAL HIGH (ref 150–400)
RBC: 3.75 MIL/uL — ABNORMAL LOW (ref 4.22–5.81)
RDW: 16 % — ABNORMAL HIGH (ref 11.5–15.5)
WBC: 7.9 10*3/uL (ref 4.0–10.5)
nRBC: 0 % (ref 0.0–0.2)

## 2023-12-08 LAB — BASIC METABOLIC PANEL
Anion gap: 6 (ref 5–15)
BUN: 10 mg/dL (ref 8–23)
CO2: 23 mmol/L (ref 22–32)
Calcium: 8.5 mg/dL — ABNORMAL LOW (ref 8.9–10.3)
Chloride: 97 mmol/L — ABNORMAL LOW (ref 98–111)
Creatinine, Ser: 0.78 mg/dL (ref 0.61–1.24)
GFR, Estimated: >60 mL/min (ref >60)
Glucose, Bld: 91 mg/dL (ref 70–99)
Potassium: 3.9 mmol/L (ref 3.5–5.1)
Sodium: 126 mmol/L — ABNORMAL LOW (ref 135–145)

## 2023-12-08 LAB — RAPID URINE DRUG SCREEN, HOSP PERFORMED
Amphetamines: NOT DETECTED
Barbiturates: NOT DETECTED
Benzodiazepines: NOT DETECTED
Cocaine: NOT DETECTED
Opiates: NOT DETECTED
Tetrahydrocannabinol: NOT DETECTED

## 2023-12-08 LAB — CREATININE, URINE, RANDOM: Creatinine, Urine: <10 mg/dL

## 2023-12-08 LAB — OSMOLALITY: Osmolality: 273 mosm/kg — ABNORMAL LOW (ref 275–295)

## 2023-12-08 LAB — C-REACTIVE PROTEIN: CRP: 5.9 mg/dL — ABNORMAL HIGH (ref <1.0)

## 2023-12-08 LAB — BRAIN NATRIURETIC PEPTIDE: B Natriuretic Peptide: 965 pg/mL — ABNORMAL HIGH (ref 0.0–100.0)

## 2023-12-08 LAB — MAGNESIUM: Magnesium: 1.6 mg/dL — ABNORMAL LOW (ref 1.7–2.4)

## 2023-12-08 LAB — SODIUM, URINE, RANDOM: Sodium, Ur: 108 mmol/L

## 2023-12-08 LAB — OSMOLALITY, URINE: Osmolality, Ur: 241 mosm/kg — ABNORMAL LOW (ref 300–900)

## 2023-12-08 LAB — PROCALCITONIN: Procalcitonin: <0.1 ng/mL

## 2023-12-08 LAB — URIC ACID: Uric Acid, Serum: 5 mg/dL (ref 3.7–8.6)

## 2023-12-08 MED ORDER — TORSEMIDE 20 MG PO TABS
20.0000 mg | ORAL_TABLET | Freq: Every day | ORAL | Status: DC
  Administered 2023-12-08 – 2023-12-10 (×3): 20 mg via ORAL
  Filled 2023-12-08 (×3): qty 1

## 2023-12-08 MED ORDER — SODIUM CHLORIDE 0.9 % IV SOLN
1.0000 g | INTRAVENOUS | Status: DC
  Filled 2023-12-08: qty 10

## 2023-12-08 MED ORDER — OXYCODONE HCL 5 MG PO TABS
10.0000 mg | ORAL_TABLET | Freq: Four times a day (QID) | ORAL | Status: DC | PRN
  Administered 2023-12-08: 10 mg via ORAL
  Filled 2023-12-08: qty 2

## 2023-12-08 MED ORDER — ATORVASTATIN CALCIUM 80 MG PO TABS
80.0000 mg | ORAL_TABLET | Freq: Every day | ORAL | Status: DC
  Administered 2023-12-08 – 2024-01-04 (×28): 80 mg via ORAL
  Filled 2023-12-08 (×28): qty 1

## 2023-12-08 MED ORDER — FAMOTIDINE 20 MG PO TABS
20.0000 mg | ORAL_TABLET | Freq: Every day | ORAL | Status: DC
  Administered 2023-12-08 – 2024-01-04 (×28): 20 mg via ORAL
  Filled 2023-12-08 (×29): qty 1

## 2023-12-08 MED ORDER — LINEZOLID 600 MG PO TABS
600.0000 mg | ORAL_TABLET | Freq: Two times a day (BID) | ORAL | Status: DC
  Administered 2023-12-08 – 2023-12-09 (×3): 600 mg via ORAL
  Filled 2023-12-08 (×3): qty 1

## 2023-12-08 MED ORDER — NALOXONE HCL 4 MG/0.1ML NA LIQD: 0.4000 mg | Freq: Once | NASAL | Status: DC

## 2023-12-08 MED ORDER — METOPROLOL SUCCINATE ER 25 MG PO TB24
25.0000 mg | ORAL_TABLET | Freq: Every day | ORAL | Status: DC
  Administered 2023-12-08: 25 mg via ORAL
  Filled 2023-12-08: qty 1

## 2023-12-08 MED ORDER — OXYCODONE HCL 5 MG PO TABS
10.0000 mg | ORAL_TABLET | ORAL | Status: DC | PRN
  Administered 2023-12-08 – 2024-01-02 (×87): 10 mg via ORAL
  Filled 2023-12-08 (×91): qty 2

## 2023-12-08 MED ORDER — LORAZEPAM 2 MG/ML IJ SOLN
1.0000 mg | Freq: Once | INTRAMUSCULAR | Status: AC | PRN
  Administered 2023-12-09: 1 mg via INTRAVENOUS

## 2023-12-08 MED ORDER — MAGNESIUM SULFATE 4 GM/100ML IV SOLN
4.0000 g | Freq: Once | INTRAVENOUS | Status: DC
Start: 2023-12-08 — End: 2023-12-10

## 2023-12-08 MED ORDER — SPIRONOLACTONE 25 MG PO TABS
25.0000 mg | ORAL_TABLET | Freq: Every day | ORAL | Status: DC
  Administered 2023-12-08 – 2023-12-10 (×3): 25 mg via ORAL
  Filled 2023-12-08 (×3): qty 1

## 2023-12-08 MED ORDER — HYDROMORPHONE HCL 1 MG/ML IJ SOLN
0.5000 mg | INTRAMUSCULAR | Status: DC | PRN
  Administered 2023-12-09 – 2023-12-29 (×98): 0.5 mg via INTRAVENOUS
  Filled 2023-12-08 (×99): qty 0.5

## 2023-12-08 NOTE — Plan of Care (Signed)
  Problem: Clinical Measurements: Goal: Diagnostic test results will improve Outcome: Progressing Goal: Respiratory complications will improve Outcome: Progressing   Problem: Activity: Goal: Risk for activity intolerance will decrease Outcome: Progressing   Problem: Nutrition: Goal: Adequate nutrition will be maintained Outcome: Progressing   Problem: Pain Managment: Goal: General experience of comfort will improve and/or be controlled Outcome: Progressing

## 2023-12-08 NOTE — Progress Notes (Signed)
 Got verbal order from Dr. Leroy Sea to change the order of oxycodone to every 4 hours PRN.Cosign required.

## 2023-12-08 NOTE — Progress Notes (Signed)
 [facility] night cross cover note:   I was notified by RN that peripheral IV access has been lost this morning, and attempts by IV team and patient's RN have been unsuccessful in reestablishing IV access.  RN conveys that the IV team with try again on day shift to reestablish piv access.      Newton Pigg, DO Hospitalist

## 2023-12-08 NOTE — Consult Note (Signed)
 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 Have pulmonary septic emboli when NOTHING seen on CXR or CT abdomen and pelvis ? Secondary gain for opiates or withdrawal given his [place] physical complaints that do not all have a clear finding on imaging to explain the pathology Hepatitis C without hepatic coma nephrograms seen on CT scan of his abdomen though he does not have urinary symptoms--would ignore this read by Radiology. UTI diagnosis cannot be made on imaging. 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 Elinor Parkinson to take over the service tomorrow.  Principal Problem:   MRSA bacteremia   Scheduled Meds:  apixaban  5 mg Oral BID   atorvastatin  80 mg Oral QHS   famotidine  20 mg Oral Daily   spironolactone  25 mg Oral  Daily   torsemide  20 mg Oral Daily   Continuous Infusions:  cefTRIAXone (ROCEPHIN)  IV     magnesium sulfate bolus IVPB     vancomycin     PRN Meds:.acetaminophen, HYDROmorphone (DILAUDID) injection, LORazepam, melatonin, naLOXone (NARCAN)  injection, oxyCODONE, polyethylene glycol, prochlorperazine  HPI: [name] [name] is a 63 y.o. 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 Pt was tachypneic on arrival to ED and remained on O2. Was re-admitted d/t cute hypoxic respiratory failure and likely copd exacerbation. Pt was improving and nearing discharge when he became acutely agitated and suicidal. He got ativan, transported to locked unit and Psychiatry consulted and he got zyprexa w/ minimal improvement. Became more altered with agitation, fever, and tachycardia. SEPSIS protocol initiated and pt started on vancomycin/zosyn. He had an up trending lactic and got 500 ml of fluid d/t HFrEF pmh. D/t increased agitation and AMS patient and high risk of decompensation, patient up-triaged to MICU. Transferred to floor from MICU 3/14. Pt ultimately found to have MRSA bacteremia. He was  seen by Dr. Cindi Carbon. With vancomycin he cleared bacteremia by 14 March.   TTE showed thickening on tricuspid valve with possible mobile component quality was not optimal. ID felt now clear source for his bacteremia--though I suspect h is still using IVDU.    Cardiology were planning TEE . Patient also had new onset back pain that began about 3 weeks ago and worsening right hip pain. Xray had suggested  possibilty of  osteomyelitis/infection of his prosthetic hip. Unfortunately, patient  refused MRI and ended up being too dangerous for him to have TEE.  ID were planning on trying to treat with 6 weeks of dalbavancin infusions given once weekly.  Plans were for him to get his first infusion on the 21st but Bailey Agge, Pharm D called [facility] and confirmed he never received it. ID at [facility] were trying to get him a dose ont he 22nd.  He then returned to the hospital but here at [facility] on the 22nd complaining of severe low back pain as well as pain in his liver and abdomen and his hip.  He had blood cultures taken and was started on IV vancomycin.  He was given fluid resuscitation and admitted to the hospitalist service.  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  CT of the abdomen pelvis performed which showed emphysematous changes and chronic bronchitic changes in the lungs with trace bilateral pleural effusions moderate right sided inguinal hernia his hip arthroplasty had a protrusion of the acetabulum with thinning of the wall of the acetabulum which progressed since prior study  I examined him today he was also complaining of neck pain he would not let me manipulate his right leg but was able to move it himself though range of motion was severely limited.  I am concerned he may have a septic hip.  He has multiple complaints of pain in liver and in his chest and states pain under his left scapula every time he raises his right leg.  His CXR is negative for acute disease with some interstial markings.  Cardiology have seen him and feel that is too dangerous to try to attempt a TEE which was the conclusion at [facility] as well.  When I talk to him he seemed willing to be treated in the  hospital for any weeks.  Of her when attempts were made to place a PICC line for access and he lost peripheral access he is denied placement of PICC line making it not possible for [place] to give him antibiotics intravenously or get contrast studies or have surgeries that he might need.  Started on the Zyvox in the interim.  I will check a hep C RNA and genotype and make sure we have screened for other hepatitis viruses.  I have personally spent 112 minutes involved in face-to-face and non-face-to-face activities for this patient on the day of the visit. Professional time spent includes the following activities: Preparing to see the patient (review of tests), Obtaining and/or reviewing separately obtained history (admission/discharge record), Performing a medically appropriate examination and/or evaluation , Ordering medications/tests/procedures, referring and communicating with other health care professionals, Documenting clinical information in the EMR, Independently interpreting results (not separately reported), Communicating results to the patient/family/caregiver, Counseling and educating the patient/family/caregiver and Care coordination (not separately reported).  Evaluation of the patient requires complex antimicrobial therapy evaluation, counseling , isolation needs to reduce disease transmission and risk assessment and mitigation.    Review of Systems: Review of Systems  Constitutional:  Positive for chills, fever and malaise/fatigue. Negative for weight loss.  HENT:  Negative for congestion and sore throat.   Eyes:  Negative for blurred vision and photophobia.  Respiratory:  Negative for cough, shortness of breath and wheezing.   Cardiovascular:  Positive for chest pain. Negative for palpitations and leg swelling.  Gastrointestinal:  Negative for abdominal pain, blood in stool, constipation, diarrhea, heartburn, melena, nausea and vomiting.  Genitourinary:  Negative for dysuria, flank  pain and hematuria.  Musculoskeletal:  Positive for back pain, falls, joint pain, myalgias and neck pain.  Skin:  Negative for itching and rash.  Neurological:  Positive for weakness and headaches. Negative for dizziness, focal weakness and loss of consciousness.  Endo/Heme/Allergies:  Does not bruise/bleed easily.  Psychiatric/Behavioral:  Positive for suicidal ideas. Negative for depression. The patient does not have insomnia.     Past Medical History:  Diagnosis Date   Alcoholic (HCC)    Anxiety    COPD (chronic obstructive pulmonary disease) (HCC)    Depression    Hepatitis C    History of blood clots    Homelessness    Malingering    Peripheral neuropathy    Polysubstance abuse (HCC)     Social History   Tobacco Use   Smoking status: Every Day    Current packs/day: 0.50    Average packs/day: 0.5 packs/day for 15.0 years (7.5 ttl pk-yrs)    Types: Cigarettes   Smokeless tobacco: Never  Vaping Use   Vaping status: Never Used  Substance Use Topics   Alcohol use: Yes    Comment: 1/5 daily   Drug use: Not Currently    Types: Cocaine, Marijuana    Comment: heroin    Family History  Problem Relation Age of Onset   Heart failure Father    Coronary artery disease Brother    Allergies  Allergen Reactions   Fentanyl Itching   Hydromorphone Hcl Itching    Pt  States itching all over Pt  States itching all over Pt  States itching all over    Morphine Itching   Pantoprazole Hives   Pantoprazole Sodium Hives   Haloperidol Other (See Comments)    Pt reports he has "seizures" had haldol on 09/25/21 with no apparent ill effect.  Pt reports he has "seizures"   Nsaids Nausea And Vomiting, Other (See Comments) and Hives    Severe cramping Other reaction(s): Abdominal Pain Cramping per [place]   Morphine And Codeine Itching   Ibuprofen    Ketorolac Other (See Comments)   Pantoprazole Sodium Hives    OBJECTIVE: Blood pressure 134/85, pulse 94, temperature 97.7  F (36.5 C), temperature source Oral, resp. rate 20, height 6\' 1"  (1.854 m), weight 55.8 kg, SpO2 97%.  Physical Exam Constitutional:      Appearance: He is cachectic. He is ill-appearing.  HENT:     Head: Normocephalic and atraumatic.  Eyes:     Conjunctiva/sclera: Conjunctivae normal.  Cardiovascular:     Rate and Rhythm: Regular rhythm. Tachycardia present.     Heart sounds: No murmur heard.    No friction rub. No gallop.  Pulmonary:     Effort: Pulmonary effort is normal. No respiratory distress.     Breath sounds: No stridor. No wheezing.  Abdominal:  General: There is no distension.     Palpations: Abdomen is soft.  Musculoskeletal:     Cervical back: Normal range of motion and neck supple.     Right hip: Tenderness present. Decreased range of motion.  Skin:    General: Skin is warm and dry.     Coloration: Skin is not pale.     Findings: No erythema or rash.  Neurological:     General: No focal deficit present.     Mental Status: He is alert and oriented to person, place, and time.  Psychiatric:        Mood and Affect: Mood normal.        Behavior: Behavior normal.        Thought Content: Thought content normal.        Judgment: Judgment normal.   He is tender under his left scapula and complains of pain throughout his abdomen and chest    Lab Results Lab Results  Component Value Date   WBC 7.9 12/08/2023   HGB 10.7 (L) 12/08/2023   HCT 32.2 (L) 12/08/2023   MCV 85.9 12/08/2023   PLT 436 (H) 12/08/2023    Lab Results  Component Value Date   CREATININE 0.78 12/08/2023   BUN 10 12/08/2023   NA 126 (L) 12/08/2023   K 3.9 12/08/2023   CL 97 (L) 12/08/2023   CO2 23 12/08/2023    Lab Results  Component Value Date   ALT 27 12/06/2023   AST 31 12/06/2023   ALKPHOS 70 12/06/2023   BILITOT 0.8 12/06/2023     Microbiology: Recent Results (from the past 240 hours)  Culture, blood (Routine x 2)     Status: None (Preliminary result)   Collection Time:  12/06/23  8:28 PM   Specimen: BLOOD LEFT FOREARM  Result Value Ref Range Status   Specimen Description BLOOD LEFT FOREARM  Final   Special Requests   Final    BOTTLES DRAWN AEROBIC ONLY Blood Culture results may not be optimal due to an inadequate volume of blood received in culture bottles   Culture   Final    NO GROWTH 2 DAYS Performed at [facility], [address] [place], [place] [address]    Report Status PENDING  Incomplete  Culture, blood (Routine x 2)     Status: None (Preliminary result)   Collection Time: 12/06/23  8:33 PM   Specimen: BLOOD RIGHT FOREARM  Result Value Ref Range Status   Specimen Description BLOOD RIGHT FOREARM  Final   Special Requests   Final    BOTTLES DRAWN AEROBIC AND ANAEROBIC Blood Culture results may not be optimal due to an inadequate volume of blood received in culture bottles   Culture   Final    NO GROWTH 2 DAYS Performed at [facility], [address], [place], [place] [address]    Report Status PENDING  Incomplete    Acey Lav, MD [facility] Infectious [facility] [phone] pager  12/08/2023, 11:44 AM

## 2023-12-08 NOTE — Progress Notes (Signed)
 PT Cancellation Note  Patient Details Name: Mariel Gaudin MRN: [id number] DOB: [birth year]   Cancelled Treatment:    Reason Eval/Treat Not Completed: Patient declined, no reason specified (pt refused stating that he has already been moving around and that he is in too much pain to participate today. Will follow up tomorrow.)   Gladys Damme 12/08/2023, 2:32 PM

## 2023-12-08 NOTE — Progress Notes (Signed)
 PROGRESS NOTE                                                                                                                                                                                                             Patient Demographics:    [name] [name], is a 63 y.o. male, DOB - 28-Nov-1960, ZOX:[id number]  Outpatient Primary MD for the patient is Patient, No Pcp Per    LOS - 1  Admit date - 12/06/2023    Chief Complaint  Patient presents with   Abdominal Pain       Brief Narrative (HPI from H&P)    63 y.o. 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   He did not want to go back to [facility], he was also due for a TEE and was admitted to the hospital here for further care.   Subjective:    [name] [name] today has, No headache, diffuse aches and pains in his abdomen and chest wall all over, No new weakness tingling or numbness, no SOB,    Assessment  & Plan :    MRSA bacteremia, POA - TTE ([facility]) revealed LVEF 20-25%, tricuspid valve appears thickened with possible mobile component, recommended TEE to better assess given the indication for the study, cardiology consulted, TEE per cardiology, also have consulted ID.  For now continue IV vancomycin defer course and duration of antibiotics to ID.  He has remote history of IV drug use and methamphetamine use, currently denies all use.   Lower back pain, compression fractures in lumbar spine vertebrae, T12, L2-L3 L5 - repeat MRI L-spine this admission stable, supportive care.  No signs of acute infection   Lactic acidosis -  due to dehydration.  Has been hydrated.   Chronic HFrEF - LVEF 20 to 25%, Euvolemic on exam, home diuretics resumed cardiology to evaluate.   Possible acute pyelonephritis seen on CT scan, POA  - Continue IV antibiotics, As needed analgesics, check UA with culture, UDS.   History of PE/DVT on Eliquis  Resume home Eliquis  also has IVC filter.   History of polysubstance abuse  Denies recent use of IV drugs   Tobacco use disorder  - Current smoker, Nicotine patch   Generalized weakness  PT OT assessment, Fall precautions   Severe protein calorie malnutrition - Dietitian consult, protein supplementation  Incidental finding of right inguinal hernia.  No acute issues.  Monitor  No IV access.  PICC line requested 12/08/2023.      Condition - Extremely Guarded  Family Communication  :  None present  Code Status :   Full  Consults  :  ID, Cards  PUD Prophylaxis :  Pepcid   Procedures  :     TTE -   TEE -   MRI L-spine.  1. Chronic compression fractures of T12, L2, L3, and L5 with no complicating features. 2. Subtle and nonspecific bilateral erector spinae muscle edema. A similar appearance can be seen in chronically debilitated patients. 3. 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 4. Capacious lumbar spinal canal with no spinal stenosis  CT scan abdomen pelvis.1. Emphysematous changes and chronic bronchitic changes in the lung bases. Trace bilateral pleural effusions. 2. Heterogeneous nephrograms bilaterally suggesting acute pyelonephritis. No abscess or hydronephrosis. 3. Moderate-sized right inguinal hernia containing bowel without obvious proximal obstruction. Visualization is limited due to streak artifact. 4. Aortic atherosclerosis.  Inferior vena caval filter. 5. Small amount of free fluid in the abdomen of nonspecific etiology      Disposition Plan  :    Status is:  Inpatient   DVT Prophylaxis  :     apixaban (ELIQUIS) tablet 5 mg     Lab Results  Component Value Date   PLT 436 (H) 12/08/2023    Diet :  Diet Order             Diet Heart Room service appropriate? Yes; Fluid consistency: Thin  Diet effective now                    Inpatient Medications  Scheduled Meds:  apixaban  5 mg Oral BID   atorvastatin  80 mg Oral QHS   famotidine  20 mg Oral Daily   metoprolol succinate  25 mg Oral Daily   spironolactone  25 mg Oral Daily   torsemide  20 mg Oral Daily   Continuous Infusions:  cefTRIAXone (ROCEPHIN)  IV     magnesium sulfate bolus IVPB     vancomycin     PRN Meds:.acetaminophen, HYDROmorphone (DILAUDID) injection, melatonin, naLOXone (NARCAN)  injection, oxyCODONE, polyethylene glycol, prochlorperazine  Antibiotics  :    Anti-infectives (From admission, onward)    Start     Dose/Rate Route Frequency Ordered Stop   12/08/23 1000  cefTRIAXone (ROCEPHIN) 1 g in sodium chloride 0.9 % 100 mL IVPB        1 g 200 mL/hr over 30 Minutes Intravenous Every 24 hours 12/08/23 0912 12/13/23 0959   12/08/23 0600  vancomycin (VANCOREADY) IVPB 1250 mg/250 mL        1,250 mg 166.7 mL/hr over 90 Minutes Intravenous Every 24 hours 12/07/23 1107     12/07/23 0600  vancomycin (VANCOCIN) IVPB 1000 mg/200 mL premix  Status:  Discontinued        1,000 mg 200 mL/hr over 60 Minutes Intravenous Every 24 hours 12/07/23 0105 12/07/23 1107         Objective:  Vitals:   12/07/23 1936 12/08/23 0037 12/08/23 0400 12/08/23 0500  BP: 128/87 121/83 118/78   Pulse: 89 88 88   Resp: 18 (!) 22 19   Temp: 97.7 F (36.5 C) 97.9 F (36.6 C) 98 F (36.7 C)   TempSrc: Oral Oral Oral   SpO2: 100% 98% 98%   Weight:    55.8 kg  Height:        Wt Readings from Last 3 Encounters:  12/08/23 55.8 kg  10/23/18 72.6 kg  09/09/18 70.3 kg     Intake/Output Summary (Last 24 hours) at 12/08/2023 0912 Last data filed at 12/08/2023 0600 Gross per  24 hour  Intake --  Output 900 ml  Net -900 ml     Physical Exam  Awake Alert, No new F.N deficits, Normal affect [place].AT,PERRAL Supple Neck, No JVD,   Symmetrical Chest wall movement, Good air movement bilaterally, CTAB RRR  +ve B.Sounds, Abd Soft, No tenderness,   No Cyanosis, Clubbing or edema      Data Review:    Recent Labs  Lab 12/06/23 2028 12/07/23 0455 12/08/23 0522  WBC 11.0* 7.8 7.9  HGB 11.8* 9.8* 10.7*  HCT 36.6* 29.8* 32.2*  PLT 432* 403* 436*  MCV 89.9 86.6 85.9  MCH 29.0 28.5 28.5  MCHC 32.2 32.9 33.2  RDW 16.2* 16.4* 16.0*  LYMPHSABS 0.9  --  0.9  MONOABS 0.9  --  0.8  EOSABS 0.0  --  0.1  BASOSABS 0.0  --  0.0    Recent Labs  Lab 12/06/23 2028 12/06/23 2103 12/06/23 2203 12/06/23 2359 12/07/23 0637 12/07/23 0641 12/07/23 1500 12/08/23 0522 12/08/23 0556  NA 130*  --   --   --   --  128*  --  126*  --   K 4.2  --   --   --   --  4.2  --  3.9  --   CL 96*  --   --   --   --  98  --  97*  --   CO2 19*  --   --   --   --  22  --  23  --   ANIONGAP 15  --   --   --   --  8  --  6  --   GLUCOSE 80  --   --   --   --  102*  --  91  --   BUN 14  --   --   --   --  12  --  10  --   CREATININE 1.05  --   --   --   --  0.90  --  0.78  --   AST 31  --   --   --   --   --   --   --   --   ALT 27  --   --   --   --   --   --   --   --   ALKPHOS 70  --   --   --   --   --   --   --   --   BILITOT 0.8  --   --   --   --   --   --   --   --   ALBUMIN 2.3*  --   --   --   --   --   --   --   --  CRP  --   --   --   --   --   --   --   --  5.9*  PROCALCITON  --   --   --   --   --   --   --  <0.10  --   LATICACIDVEN  --  2.5*  --  2.3* 1.8  --  1.6  --   --   INR  --   --  1.6*  --   --   --   --   --   --   BNP  --   --   --   --   --   --   --   --  965.0*  MG  --   --   --   --   --  1.6*  --  1.6*  --   PHOS  --   --   --   --   --  2.9  --   --   --   CALCIUM 9.1  --   --   --   --  8.7*  --  8.5*  --       Recent Labs  Lab 12/06/23 2028  12/06/23 2103 12/06/23 2203 12/06/23 2359 12/07/23 0637 12/07/23 0641 12/07/23 1500 12/08/23 0522 12/08/23 0556  CRP  --   --   --   --   --   --   --   --  5.9*  PROCALCITON  --   --   --   --   --   --   --  <0.10  --   LATICACIDVEN  --  2.5*  --  2.3* 1.8  --  1.6  --   --   INR  --   --  1.6*  --   --   --   --   --   --   BNP  --   --   --   --   --   --   --   --  965.0*  MG  --   --   --   --   --  1.6*  --  1.6*  --   CALCIUM 9.1  --   --   --   --  8.7*  --  8.5*  --     --------------------------------------------------------------------------------------------------------------- No results found for: "CHOL", "HDL", "LDLCALC", "LDLDIRECT", "TRIG", "CHOLHDL"  No results found for: "HGBA1C" No results for input(s): "TSH", "T4TOTAL", "FREET4", "T3FREE", "THYROIDAB" in the last 72 hours. No results for input(s): "VITAMINB12", "FOLATE", "FERRITIN", "TIBC", "IRON", "RETICCTPCT" in the last 72 hours. ------------------------------------------------------------------------------------------------------------------ Cardiac Enzymes No results for input(s): "CKMB", "TROPONINI", "MYOGLOBIN" in the last 168 hours.  Invalid input(s): "CK"  Micro Results Recent Results (from the past 240 hours)  Culture, blood (Routine x 2)     Status: None (Preliminary result)   Collection Time: 12/06/23  8:28 PM   Specimen: BLOOD LEFT FOREARM  Result Value Ref Range Status   Specimen Description BLOOD LEFT FOREARM  Final   Special Requests   Final    BOTTLES DRAWN AEROBIC ONLY Blood Culture results may not be optimal due to an inadequate volume of blood received in culture bottles   Culture   Final    NO GROWTH 2 DAYS Performed at [facility], [address] [place], [place] [address]    Report Status PENDING  Incomplete  Culture, blood (Routine x  2)     Status: None (Preliminary result)   Collection Time: 12/06/23  8:33 PM   Specimen: BLOOD RIGHT FOREARM  Result Value Ref Range  Status   Specimen Description BLOOD RIGHT FOREARM  Final   Special Requests   Final    BOTTLES DRAWN AEROBIC AND ANAEROBIC Blood Culture results may not be optimal due to an inadequate volume of blood received in culture bottles   Culture   Final    NO GROWTH 2 DAYS Performed at [facility], [address] [place], [place] [address]    Report Status PENDING  Incomplete    Radiology Report MR LUMBAR SPINE WO CONTRAST Result Date: 12/07/2023 CLINICAL DATA:  63 year old male with pain, age indeterminate lumbar compression fractures. EXAM: MRI LUMBAR SPINE WITHOUT CONTRAST TECHNIQUE: Multiplanar, multisequence MR imaging of the lumbar spine was performed. No intravenous contrast was administered. COMPARISON:  Lumbar spine CT last night. FINDINGS: Segmentation:  Normal on the comparison. Alignment: Stable, normal lumbar lordosis. No significant scoliosis or spondylolisthesis. Vertebrae: Background bone marrow signal is normal and there are chronic compression fractures of T12, L2 (moderate), L3, and L5. Minimal retropulsion, such as at the L2 posterosuperior endplate. Superimposed mild degenerative appearing endplate marrow edema posteriorly at L2-L3. Intact visible sacrum and SI joints. No other acute osseous abnormality. Conus medullaris and cauda equina: Conus extends to the T12-L1 level. No lower spinal cord or conus signal abnormality. Normal cauda equina nerve roots. Capacious spinal canal. Paraspinal and other soft tissues: Susceptibility artifact anterior to the right sacrum related to pronounced right acetabular protrusion in the setting of hip arthroplasty, see CT Abdomen and Pelvis last night. Stable visible abdominal viscera. Mild symmetric and nonspecific bilateral erector spinae muscle STIR hyperintensity, such as on the left side series 4, image 19. No paraspinal fluid collection. Disc levels: Capacious spinal canal with no age advanced lumbar spine degeneration when considering the  presence of multiple compression fractures. No lumbar spinal stenosis. There is mild bilateral L4 and left L5 degenerative neural foraminal stenosis. IMPRESSION: 1. Chronic compression fractures of T12, L2, L3, and L5 with no complicating features. 2. Subtle and nonspecific bilateral erector spinae muscle edema. A similar appearance can be seen in chronically debilitated patients. 3. 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 4. Capacious lumbar spinal canal with no spinal stenosis. Electronically Signed   By: Odessa Fleming M.D.   On: 12/07/2023 08:43   DG Chest 2 View Result Date: 12/07/2023 CLINICAL DATA:  Suspected Sepsis abdominal pain EXAM: CHEST - 2 VIEW COMPARISON:  Chest x-ray 04/16/2022 FINDINGS: The heart and mediastinal contours are within normal limits. No focal consolidation. Chronic coarsened interstitial markings with no overt pulmonary edema. No pleural effusion. No pneumothorax. No acute osseous abnormality. Degenerative changes of the shoulders. IMPRESSION: No active cardiopulmonary disease. Electronically Signed   By: Tish Frederickson M.D.   On: 12/07/2023 00:32   CT L-SPINE NO CHARGE Result Date: 12/06/2023 CLINICAL DATA:  Acute nonlocalized abdominal pain EXAM: CT LUMBAR SPINE WITHOUT CONTRAST TECHNIQUE: Multidetector CT imaging of the lumbar spine was performed without intravenous contrast administration. Multiplanar CT image reconstructions were also generated. RADIATION DOSE REDUCTION: This exam was performed according to the departmental dose-optimization program which includes automated exposure control, adjustment of the mA and/or kV according to patient size and/or use of iterative reconstruction technique. COMPARISON:  CT abdomen and pelvis 05/04/2018 FINDINGS: Segmentation: 5  lumbar type vertebral bodies. Alignment: Normal alignment. Vertebrae: Compression of the L2 vertebra with  about 40% loss of height. This is new since prior study. No retropulsion of fracture fragments. Compression of the superior endplate of L3 with about 20% loss of height. This is new since the prior study. No retropulsion of fracture fragments. Compression of the superior endplate of L5 is unchanged since prior study. No destructive or expansile bone lesions. Paraspinal and other soft tissues: See additional report of CT abdomen and pelvis same date. Disc levels: Intervertebral disc space heights are mostly preserved. Mild endplate osteophyte formation. IMPRESSION: Compression deformities of L2 and L3, new since prior study. Acute compression is not excluded. No retropulsion of fracture fragments. Electronically Signed   By: Burman Nieves M.D.   On: 12/06/2023 23:53   CT ABDOMEN PELVIS W CONTRAST Result Date: 12/06/2023 CLINICAL DATA:  Acute nonlocalized abdominal pain. EXAM: CT ABDOMEN AND PELVIS WITH CONTRAST TECHNIQUE: Multidetector CT imaging of the abdomen and pelvis was performed using the standard protocol following bolus administration of intravenous contrast. RADIATION DOSE REDUCTION: This exam was performed according to the departmental dose-optimization program which includes automated exposure control, adjustment of the mA and/or kV according to patient size and/or use of iterative reconstruction technique. CONTRAST:  75mL OMNIPAQUE IOHEXOL 350 MG/ML SOLN COMPARISON:  05/04/2018 FINDINGS: Lower chest: Emphysematous changes in the lung bases. Bronchiectasis and bronchial wall thickening with evidence of mucous plugging likely chronic bronchitis. Trace bilateral pleural effusions with basilar atelectasis. Prominent coronary artery calcifications. Hepatobiliary: No focal liver abnormality is seen. No gallstones, gallbladder wall thickening, or biliary dilatation. Pancreas: Unremarkable. No pancreatic ductal dilatation or surrounding inflammatory changes. Spleen: Normal in size without focal  abnormality. Adrenals/Urinary Tract: No adrenal gland nodules. Chronic scarring in the kidneys. Heterogeneous nephrograms bilaterally suggest evidence of acute pyelonephritis. No renal or ureteral obstruction. Visualized bladder is unremarkable. Portions of the bladder and pelvis are not visualized due to streak artifact from right hip arthroplasty. Stomach/Bowel: Stomach, small bowel, and colon are not abnormally distended. Stool fills the colon. There is a moderate-sized right inguinal hernia containing bowel but without obvious proximal obstruction. Visualization is limited due to the streak artifact. Vascular/Lymphatic: Calcification of the aorta. No aneurysm. Inferior vena caval filter. No significant lymphadenopathy. Reproductive: Prostate gland is mildly enlarged. Other: Small amount of free fluid along the pericolic gutters is nonspecific. No free air. 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 This is progressing since the previous study. Heterotopic ossification. Compression deformities of T12, L2, L3, and L5 demonstrate progression since the previous study. Acute compression is not excluded. Degenerative changes in the spine. Old rib fractures. IMPRESSION: 1. Emphysematous changes and chronic bronchitic changes in the lung bases. Trace bilateral pleural effusions. 2. Heterogeneous nephrograms bilaterally suggesting acute pyelonephritis. No abscess or hydronephrosis. 3. Moderate-sized right inguinal hernia containing bowel without obvious proximal obstruction. Visualization is limited due to streak artifact. 4. Aortic atherosclerosis.  Inferior vena caval filter. 5. Small amount of free fluid in the abdomen of nonspecific etiology. Electronically Signed   By: Burman Nieves M.D.   On: 12/06/2023 23:50     Signature  -   Susa Raring M.D on 12/08/2023 at 9:12 AM   -  To page go to [email or web address]

## 2023-12-08 NOTE — Progress Notes (Signed)
 There was a consult for placing a PIV access. Assessed both Rt. & Lt. Arms, there was no suitable veins for a PIV access. Assessed on Rt. Lower Arm- veins were small and deep or non-compressible. Rt. Upper arm- cephalic & basilic veins were non-compressible, only one brachial was small then getting bigger but it was under the artery. Assessed Lt. Lower arm - veins were small and deep and non compressible with not enough straight or bifurcated. Lt. Upper arm - couldn't find basilic & cephalic veins. Informed patient's RN regarding this finding. Patient may need central line. HS [employer]

## 2023-12-08 NOTE — Progress Notes (Signed)
 Patient refused positioning and dressing change.

## 2023-12-08 NOTE — Progress Notes (Signed)
 MRI of the cervical spine is still due.Per MRI it can be done only after the order is changed to WO contrast.Dr.Van Debbrah Alar made aware through secure chat.

## 2023-12-08 NOTE — Progress Notes (Signed)
 OT Cancellation Note  Patient Details Name: [name] [name] MRN: [id number] DOB: 12/29/1960   Cancelled Treatment:    Reason Eval/Treat Not Completed: Patient declined, no reason specified (pt reports he "cant do anything", despite premedication from RN. Declines OOB or OT eval at this time. Will follow up next date as schedule permits.)  Carver Fila, OTD, OTR/L SecureChat [facility] [phone]   Dalphine Handing 12/08/2023, 1:43 PM

## 2023-12-08 NOTE — Consult Note (Signed)
 Please note that the [facility] nursing team is utilizing a standardized work plan to manage patient consults. We are triaging consults and will try to see the patients within 48 hours. Wound photos in the patient's chart allow [place] to consult on the patient in the most efficient and timely manner.    Guneet Delpino [facility], CNS, [employer] [phone]

## 2023-12-08 NOTE — Progress Notes (Signed)
 Patient has no any IV access.IV team came for PICC line placement but the patient refused for it.MD made aware.

## 2023-12-08 NOTE — Plan of Care (Signed)

## 2023-12-08 NOTE — Consult Note (Addendum)
 Cardiology Consultation   [name] [name] ID: Leeam Cedrone MRN: [id number]; DOB: 10-Dec-1960  Admit date: 12/06/2023 Date of Consult: 12/08/2023  PCP:  [name] [name], No Pcp Per   [facility] Providers Cardiologist: Followed by [facility] and [facility] during admissions but no outpatient follow-up  [name] [name] Profile:   [name] [name] is a 63 y.o. male with a hx of chronic HFrEF (EF 40% in 12/2019, 20-25% in 04/2021, 15-20% in 05/2021 and 09/2021, 20-25% in 09/2021, < 15% in 02/2022, 11/2022 and 06/2023), CAD (cath in 02/2022 showing multivessel CAD with 50% LMCA stenosis, 80% mid RCA stenosis, 80% D2 stenosis, 40% proximal LAD stenosis, 60% mid/distal LAD stenosis, 70% OM1 stenosis and 70% mid LCx stenosis --> CT surgery consulted and ultimately decision was for medical management given his ongoing drug use), COPD, HTN, HLD, history of PE/DVT, homelessness and ongoing polysubstance abuse (UDS in 10/2023 positive for amphetamines and cocaine) who is being seen 12/08/2023 for the evaluation of chronic HFrEF and need for TEE at the request of Dr. Thedore Mins.  History of Present Illness:   [name] [name] was recently admitted at [facility] from 3/8 - 12/06/2023 for acute hypoxic respiratory failure and felt to likely be due to a COPD exacerbation. Was found to have MRSA bacteremia with concern for endocarditis and there was also concern for possibility of thoracic/lumbar spine and right hip osteomyelitis and [name] [name] refused MRI despite multiple attempts. Transthoracic echocardiogram showed reduced EF of 20 to 25% and tricuspid valve appeared to be thickened with possible mobile component and a TEE was recommended for further assessment. By review of the discharge summary, it was felt that a TEE could not be safely performed due to [name] [name]'s agitation and refusal of the procedure. [name] [name] also had suicidal ideations during admission and required transport to a locked unit and psychiatry evaluation. Was ultimately cleared for  discharge with plans for him to return for 2 doses of Dalbavancin and bus passes were provided as [name] [name] was homeless. Cardiac medications at time of discharge included Toprol-XL 25mg  daily, Eliquis 5 mg BID, Atorvastatin 80mg  daily, Spironolactone 25 mg daily and Torsemide 20 mg daily.  Presented back to [facility] ED the same day of hospital discharge after EMS was called out for pain along his abdomen and back. Initial labs showed WBC 11.0, Hgb 11.8, platelets 432, Na+ 130, K+ 4.2 and creatinine 1.05.  AST 31 ALT 27. Albumin low at 2.3. Lactic acid elevated at 2.5 with repeat having normalized at 1.8. Hs troponin values negative at 13 and 14. EKG showed NSR, HR 95 with PVC's and LVH with slight ST depression along the lateral leads. CXR with no active cardiopulmonary disease. CT Abdomen showed emphysema and chronic bronchitis along with heterogeneous nephrogram suggesting acute pyelonephritis. [name] [name] did undergo a lumbar MRI yesterday which showed chronic compression fractures and edema along the bilateral erector spinae muscle which could be due to the chronic debilitation.  [name] [name] was admitted for further management of MRSA bacteremia and started on IV vancomycin. [name] [name] also received IV fluids on admission but these have now been discontinued. [name] [name] has been on Eliquis 5 mg twice daily for anticoagulation. Most recent labs today show his hyponatremia is worsening with Na+ at 126. Mg low at 1.6. CRP elevated at 5.9 and BNP at 965.  In talking with the [name] [name] today, [name] [name] reports [name] [name] is homeless and travels to different hotels. Says that [name] [name] last used meth approximately 3 months ago and has not consumed alcohol since. UDS was negative when  checked on 11/26/2023.  Says that [name] [name] has shortness of breath with activity and also when lying down at night to go to sleep. Denies any specific abdominal distention or lower extremity edema. Says that [name] [name] takes Lasix intermittently does take Eliquis. Unclear if taking Toprol-XL or  Spironolactone but these do not sound familiar to him. No recent chest pain or palpitations. Reports hurting along his abdomen for the past few weeks. No association with food consumption.  Becomes frustrated very quickly during the time of this encounter if asked to move his hand as the anesthesia team is trying to obtain IV access as this was lost overnight.   Past Medical History:  Diagnosis Date   Alcoholic (HCC)    Anxiety    COPD (chronic obstructive pulmonary disease) (HCC)    Depression    Hepatitis C    History of blood clots    Homelessness    Malingering    Peripheral neuropathy    Polysubstance abuse (HCC)     Past Surgical History:  Procedure Laterality Date   ABDOMINAL SURGERY     HIP SURGERY       Home Medications:  Prior to Admission medications   Medication Sig Start Date End Date Taking? Authorizing Provider  atorvastatin (LIPITOR) 80 MG tablet Take 80 mg by mouth at bedtime. 07/18/23  Yes [provider]  bisacodyl (DULCOLAX) 10 MG suppository Place rectally. 11/23/23  Yes [provider]  Docusate Sodium (DSS) 100 MG CAPS Take 1 capsule by mouth daily as needed. 11/23/23  Yes [provider]  ELIQUIS 5 MG TABS tablet Take 5 mg by mouth 2 (two) times daily. 08/21/23  Yes [provider]  escitalopram (LEXAPRO) 20 MG tablet Take 20 mg by mouth daily. 08/21/23  Yes [provider]  famotidine (PEPCID) 20 MG tablet Take 10 mg by mouth 2 (two) times daily. 08/21/23  Yes [provider]  hydrOXYzine (ATARAX) 25 MG tablet Take 25 mg by mouth 3 (three) times daily. 11/23/23  Yes [provider]  metoprolol succinate (TOPROL-XL) 25 MG 24 hr tablet Take 25 mg by mouth daily. 11/23/23  Yes [provider]  Multiple Vitamin (QUINTABS) TABS Take 1 tablet by mouth daily. 09/17/18  Yes [provider]  naloxone (NARCAN) nasal spray 4 mg/0.1 mL Place 0.4 mg into the nose once. 09/03/23  Yes [provider]  polyethylene glycol powder (GLYCOLAX/MIRALAX) 17 GM/SCOOP powder Take 17 g by mouth 2 (two) times daily as needed for mild constipation. 11/23/23  Yes [provider]  prochlorperazine (COMPAZINE) 5 MG tablet Take 5 mg by mouth 2 (two) times daily as needed for nausea or vomiting. 11/23/23  Yes [provider]  senna (SENOKOT) 8.6 MG TABS tablet Take 2 tablets by mouth daily as needed for mild constipation.   Yes [provider]  spironolactone (ALDACTONE) 25 MG tablet Take 25 mg by mouth daily. 09/18/23  Yes [provider]  torsemide (DEMADEX) 20 MG tablet Take 1 tablet by mouth daily. 09/09/23  Yes [provider]  traMADol (ULTRAM) 50 MG tablet Take 50 mg by mouth every 6 (six) hours as needed. 11/23/23  Yes [provider]    Inpatient Medications: Scheduled Meds:  apixaban  5 mg Oral BID   Continuous Infusions:  vancomycin     PRN Meds: acetaminophen, diphenhydrAMINE, HYDROmorphone (DILAUDID) injection, melatonin, naLOXone (NARCAN)  injection, oxyCODONE, polyethylene glycol, prochlorperazine  Allergies:    Allergies  Allergen Reactions   Fentanyl  Itching   Hydromorphone Hcl Itching    Pt  States itching all over Pt  States itching all over Pt  States itching all over    Morphine Itching   Pantoprazole Hives   Pantoprazole Sodium Hives   Haloperidol Other (See Comments)    Pt reports [name] [name] has "seizures" had haldol on 09/25/21 with no apparent ill effect.  Pt reports [name] [name] has "seizures"   Nsaids Nausea And Vomiting, Other (See Comments) and Hives    Severe cramping Other reaction(s): Abdominal Pain Cramping per [place]   Morphine And Codeine Itching   Ibuprofen    Ketorolac Other (See Comments)   Pantoprazole Sodium Hives    Social History:   Social History   Socioeconomic History   Marital status: Single    Spouse name: Not on file   Number of children: Not on file   Years of education: Not on file    Highest education level: Not on file  Occupational History   Not on file  Tobacco Use   Smoking status: Every Day    Current packs/day: 0.50    Average packs/day: 0.5 packs/day for 15.0 years (7.5 ttl pk-yrs)    Types: Cigarettes   Smokeless tobacco: Never  Vaping Use   Vaping status: Never Used  Substance and Sexual Activity   Alcohol use: Yes    Comment: 1/5 daily   Drug use: Not Currently    Types: Cocaine, Marijuana    Comment: heroin   Sexual activity: Never  Other Topics Concern   Not on file  Social History Narrative   Not on file   Family History:   Family History  Problem Relation Age of Onset   Heart failure Father    Coronary artery disease Brother       ROS:  Please see the history of present illness.   All other ROS reviewed and negative.     Physical Exam/Data:   Vitals:   12/07/23 1936 12/08/23 0037 12/08/23 0400 12/08/23 0500  BP: 128/87 121/83 118/78   Pulse: 89 88 88   Resp: 18 (!) 22 19   Temp: 97.7 F (36.5 C) 97.9 F (36.6 C) 98 F (36.7 C)   TempSrc: Oral Oral Oral   SpO2: 100% 98% 98%   Weight:    55.8 kg  Height:        Intake/Output Summary (Last 24 hours) at 12/08/2023 0855 Last data filed at 12/08/2023 0600 Gross per 24 hour  Intake --  Output 900 ml  Net -900 ml      12/08/2023    5:00 AM 12/07/2023    1:53 AM 12/06/2023    8:07 PM  Last 3 Weights  Weight (lbs) 123 lb 0.3 oz 139 lb 160 lb 0.9 oz  Weight (kg) 55.8 kg 63.05 kg 72.6 kg     Body mass index is 16.23 kg/m.  General: Thin male, currently in no acute distress. Disheveled appearance.  HEENT: normal Neck: No carotid bruits.  Vascular: No carotid bruits; Distal pulses 2+ bilaterally Cardiac:  normal S1, S2; RRR with occasional ectopic beats.  Lungs:  clear to auscultation bilaterally, no wheezing, rhonchi or rales  Abd: soft, nontender, no hepatomegaly  Ext: no pitting edema Musculoskeletal:  No deformities, BUE and BLE strength normal and equal Skin: warm and  dry  Neuro:  CNs 2-12 intact, no focal abnormalities noted Psych:  Normal affect   Telemetry:  Telemetry was personally reviewed and demonstrates: NSR, HR in 90's. Occasional  PVC's and couplets.   Relevant CV Studies:  Cardiac Catheterization: 02/2022  Multi-vessel coronary artery disease.   No significant aortic valve gradient on catheter pullback.   Refer to cardiothoracic surgeon for opinion and possible surgery.   Discussed with [name] [name]  but may still be drowsy .   RLCP   41F R venous, 82F R common femoral artery. US guided. Good sticks.  Micropuncture access    Swan advanced to PA - Wedge with RHC numbers as below. V18 needed for  crossing to RPA   Transitioned to left sided access.   Using 0.035 wire, the catheter was guided to the ascending aorta.  Selective engagement of the left main with a JL3.5.  Left system findings  as below.   The JL was removed and exchanged over an 0.035 wire for a JR4 catheter.  The JR4 was placed in the right main.   RCA findings as below.   Anatomy is right dominant   The JR4 was removed over a 0.035 J wire, and a pigtail catheter was  advanced. The Pigtail was advanced across the aortic valve.  An LVEDP was  measured and was .  No LV gram was performed.   Findings   RCA 80% mid, obstructive. Dom vessel  LMCA  50% ostial narrowing.  LAD gives rise to two small diagonal vessels, sub 2.0 mm, with mild  diffuse disease of D1 and ostial 80% of D2.  The distal LAD is free of  significant angiographic disease.  pLAD luminal 40% eccentric lesion  Mid-distal LAD just after D2, 60% complex lesion  LCx gives rise to a large branching OM1, moderate OM2.  70% ostial OM1  with 70% mid LCx just after OM1.  Tandem 50% lesion in the ongoing mid  Lcx.  OM2 has luminal irregularities.   EBL <53mL. No specimens   Procedural Statistics    Pressures   RA 5 mmHg-4 mmHg-3 mmHg  RV 32 mmHg-5 mmHg-6 mmHg  PA 31 mmHg-17 mmHg-23 mmHg  PCW  10 mmHg-9 mmHg-10 mmHg  LA  - -   LV 89 mmHg--8 mmHg-24 mmHg  AO  - -   BP 106-75    Saturations   IVC Saturation O2-Sat    SVC Saturation O2-Sat    RA Saturation O2-Sat 63.6  RV Saturation O2-Sat    PA Saturation O2-Sat 58  PA Saturation O2-Cont 75.1  PCW Saturation O2-Sat    AO Saturation O2-Sat 93  AO Saturation O2-cont 120.9    Derived Parameters   PVR 258.1  PVR Index 463.7  SVR    SVR Index      Cardiac Output   Fick A-V O2 Diff 45.8 mL-L  Fick O2 Consumption    Fick Predicted O2 Consumption 229.9 mL-min  Fick CO 5.01 l-min  Fick CI 2.79 l-min-m2  Thermo CO 4.03 l-min  Thermo CI 2.24 l-min-m2   Echocardiogram: 11/27/2023 SUMMARY  The left ventricular size is normal.  Left ventricular systolic function is severely reduced.  LV ejection fraction = 20-25%.  Left ventricular filling pattern is prolonged relaxation.  The right ventricle is normal in size and function.  The left atrium is moderately dilated.  Tricuspid valve appears thickened with possible mobile component. Recommend a TEE to better assesss  given the indication for this study  There was insufficient TR detected to calculate RV systolic pressure.  IVC size was normal.  There is no pericardial effusion.   Laboratory Data:  High Sensitivity Troponin:   Recent  Labs  Lab 12/06/23 2028 12/06/23 2203  TROPONINIHS 13 14     Chemistry Recent Labs  Lab 12/06/23 2028 12/07/23 0641 12/08/23 0522  NA 130* 128* 126*  K 4.2 4.2 3.9  CL 96* 98 97*  CO2 19* 22 23  GLUCOSE 80 102* 91  BUN 14 12 10   CREATININE 1.05 0.90 0.78  CALCIUM 9.1 8.7* 8.5*  MG  --  1.6* 1.6*  GFRNONAA >60 >60 >60  ANIONGAP 15 8 6     Recent Labs  Lab 12/06/23 2028  PROT 8.4*  ALBUMIN 2.3*  AST 31  ALT 27  ALKPHOS 70  BILITOT 0.8   Lipids No results for input(s): "CHOL", "TRIG", "HDL", "LABVLDL", "LDLCALC", "CHOLHDL" in the last 168 hours.  Hematology Recent Labs  Lab 12/06/23 2028 12/07/23 0455  12/08/23 0522  WBC 11.0* 7.8 7.9  RBC 4.07* 3.44* 3.75*  HGB 11.8* 9.8* 10.7*  HCT 36.6* 29.8* 32.2*  MCV 89.9 86.6 85.9  MCH 29.0 28.5 28.5  MCHC 32.2 32.9 33.2  RDW 16.2* 16.4* 16.0*  PLT 432* 403* 436*   Thyroid No results for input(s): "TSH", "FREET4" in the last 168 hours.  BNP Recent Labs  Lab 12/08/23 0556  BNP 965.0*    DDimer No results for input(s): "DDIMER" in the last 168 hours.   Radiology/Studies:  MR LUMBAR SPINE WO CONTRAST Result Date: 12/07/2023 CLINICAL DATA:  63 year old male with pain, age indeterminate lumbar compression fractures. EXAM: MRI LUMBAR SPINE WITHOUT CONTRAST TECHNIQUE: Multiplanar, multisequence MR imaging of the lumbar spine was performed. No intravenous contrast was administered. COMPARISON:  Lumbar spine CT last night. FINDINGS: Segmentation:  Normal on the comparison. Alignment: Stable, normal lumbar lordosis. No significant scoliosis or spondylolisthesis. Vertebrae: Background bone marrow signal is normal and there are chronic compression fractures of T12, L2 (moderate), L3, and L5. Minimal retropulsion, such as at the L2 posterosuperior endplate. Superimposed mild degenerative appearing endplate marrow edema posteriorly at L2-L3. Intact visible sacrum and SI joints. No other acute osseous abnormality. Conus medullaris and cauda equina: Conus extends to the T12-L1 level. No lower spinal cord or conus signal abnormality. Normal cauda equina nerve roots. Capacious spinal canal. Paraspinal and other soft tissues: Susceptibility artifact anterior to the right sacrum related to pronounced right acetabular protrusion in the setting of hip arthroplasty, see CT Abdomen and Pelvis last night. Stable visible abdominal viscera. Mild symmetric and nonspecific bilateral erector spinae muscle STIR hyperintensity, such as on the left side series 4, image 19. No paraspinal fluid collection. Disc levels: Capacious spinal canal with no age advanced lumbar spine  degeneration when considering the presence of multiple compression fractures. No lumbar spinal stenosis. There is mild bilateral L4 and left L5 degenerative neural foraminal stenosis. IMPRESSION: 1. Chronic compression fractures of T12, L2, L3, and L5 with no complicating features. 2. Subtle and nonspecific bilateral erector spinae muscle edema. A similar appearance can be seen in chronically debilitated patients. 3. 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 4. Capacious lumbar spinal canal with no spinal stenosis. Electronically Signed   By: Odessa Fleming M.D.   On: 12/07/2023 08:43   DG Chest 2 View Result Date: 12/07/2023 CLINICAL DATA:  Suspected Sepsis abdominal pain EXAM: CHEST - 2 VIEW COMPARISON:  Chest x-ray 04/16/2022 FINDINGS: The heart and mediastinal contours are within normal limits. No focal consolidation. Chronic coarsened interstitial markings with no overt pulmonary edema. No pleural  effusion. No pneumothorax. No acute osseous abnormality. Degenerative changes of the shoulders. IMPRESSION: No active cardiopulmonary disease. Electronically Signed   By: Tish Frederickson M.D.   On: 12/07/2023 00:32   CT L-SPINE NO CHARGE Result Date: 12/06/2023 CLINICAL DATA:  Acute nonlocalized abdominal pain EXAM: CT LUMBAR SPINE WITHOUT CONTRAST TECHNIQUE: Multidetector CT imaging of the lumbar spine was performed without intravenous contrast administration. Multiplanar CT image reconstructions were also generated. RADIATION DOSE REDUCTION: This exam was performed according to the departmental dose-optimization program which includes automated exposure control, adjustment of the mA and/or kV according to [name] [name] size and/or use of iterative reconstruction technique. COMPARISON:  CT abdomen and pelvis 05/04/2018 FINDINGS: Segmentation: 5 lumbar type vertebral bodies. Alignment: Normal alignment. Vertebrae:  Compression of the L2 vertebra with about 40% loss of height. This is new since prior study. No retropulsion of fracture fragments. Compression of the superior endplate of L3 with about 20% loss of height. This is new since the prior study. No retropulsion of fracture fragments. Compression of the superior endplate of L5 is unchanged since prior study. No destructive or expansile bone lesions. Paraspinal and other soft tissues: See additional report of CT abdomen and pelvis same date. Disc levels: Intervertebral disc space heights are mostly preserved. Mild endplate osteophyte formation. IMPRESSION: Compression deformities of L2 and L3, new since prior study. Acute compression is not excluded. No retropulsion of fracture fragments. Electronically Signed   By: Burman Nieves M.D.   On: 12/06/2023 23:53   CT ABDOMEN PELVIS W CONTRAST Result Date: 12/06/2023 CLINICAL DATA:  Acute nonlocalized abdominal pain. EXAM: CT ABDOMEN AND PELVIS WITH CONTRAST TECHNIQUE: Multidetector CT imaging of the abdomen and pelvis was performed using the standard protocol following bolus administration of intravenous contrast. RADIATION DOSE REDUCTION: This exam was performed according to the departmental dose-optimization program which includes automated exposure control, adjustment of the mA and/or kV according to [name] [name] size and/or use of iterative reconstruction technique. CONTRAST:  75mL OMNIPAQUE IOHEXOL 350 MG/ML SOLN COMPARISON:  05/04/2018 FINDINGS: Lower chest: Emphysematous changes in the lung bases. Bronchiectasis and bronchial wall thickening with evidence of mucous plugging likely chronic bronchitis. Trace bilateral pleural effusions with basilar atelectasis. Prominent coronary artery calcifications. Hepatobiliary: No focal liver abnormality is seen. No gallstones, gallbladder wall thickening, or biliary dilatation. Pancreas: Unremarkable. No pancreatic ductal dilatation or surrounding inflammatory changes. Spleen:  Normal in size without focal abnormality. Adrenals/Urinary Tract: No adrenal gland nodules. Chronic scarring in the kidneys. Heterogeneous nephrograms bilaterally suggest evidence of acute pyelonephritis. No renal or ureteral obstruction. Visualized bladder is unremarkable. Portions of the bladder and pelvis are not visualized due to streak artifact from right hip arthroplasty. Stomach/Bowel: Stomach, small bowel, and colon are not abnormally distended. Stool fills the colon. There is a moderate-sized right inguinal hernia containing bowel but without obvious proximal obstruction. Visualization is limited due to the streak artifact. Vascular/Lymphatic: Calcification of the aorta. No aneurysm. Inferior vena caval filter. No significant lymphadenopathy. Reproductive: Prostate gland is mildly enlarged. Other: Small amount of free fluid along the pericolic gutters is nonspecific. No free air. 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 This is progressing since the previous study. Heterotopic ossification. Compression deformities of T12, L2, L3, and L5 demonstrate progression since the previous study. Acute compression is not excluded. Degenerative changes in the spine. Old rib fractures. IMPRESSION: 1. Emphysematous changes and chronic bronchitic changes in the lung bases. Trace bilateral pleural effusions. 2. Heterogeneous nephrograms bilaterally suggesting acute  pyelonephritis. No abscess or hydronephrosis. 3. Moderate-sized right inguinal hernia containing bowel without obvious proximal obstruction. Visualization is limited due to streak artifact. 4. Aortic atherosclerosis.  Inferior vena caval filter. 5. Small amount of free fluid in the abdomen of nonspecific etiology. Electronically Signed   By: Burman Nieves M.D.   On: 12/06/2023 23:50     Assessment and Plan:   1. Chronic HFrEF - [name] [name] has a longstanding cardiomyopathy dating back to at  least 2021 and EF was previously less than 15% and read as being at 20 to 25% by most recent echocardiogram earlier this month. BNP was at 956 when checked earlier today and by review of records, this is chronically elevated. [name] [name] has been restarted on Spironolactone 25 mg daily, Torsemide 20 mg daily and Toprol-XL 25 mg daily. Would stop Toprol-XL given cocaine use and could possibly switch to Coreg pending BP trend. Would be hesitant to start Entresto given variable BP and would not use an SGLT2 inhibitor given his infection risk. Likely not a candidate for advanced therapies. Overall, his prognosis is very poor given his longstanding cardiomyopathy, advanced CAD, medication noncompliance, homelessness and drug use.  2.  CAD - Prior cardiac catheterization 02/2022 showed multivessel CAD as outlined above, including 50% LMCA stenosis.  CT surgery was consulted at that time and recommended a cardiac MRI for viability but [name] [name] declined and [name] [name] was overall felt to be a poor surgical candidate given continued drug use and homelessness. - [name] [name] has not been on ASA given the need for anticoagulation with Eliquis. Likely not on statin due to noncompliance. Will check FLP.   3. COPD - CT imaging has shown emphysema and chronic bronchitis. Not on inhalers or nebulizers routinely.  4.  MRSA bacteremia - ID consult is pending and plans are for possible TEE. By review of notes from his recent hospitalization, [name] [name] declined this several times and in talking with the [name] [name] today, [name] [name] reports [name] [name] would be in agreement to proceed if put asleep for the procedure. Risks and benefits reviewed and [name] [name] agrees to proceed at this time. I am concerned this might change throughout admission as this has been a recurrent issue for the [name] [name] by review of Care Everywhere. - The risks [esophageal damage, perforation (1:10,000 risk), bleeding, pharyngeal hematoma as well as other potential complications associated with conscious sedation  including aspiration, arrhythmia, respiratory failure and death], benefits (treatment guidance and diagnostic support) and alternatives of a transesophageal echocardiogram were discussed in detail with [name] [name] and [name] [name] is willing to proceed.  - Will recheck a UDS (denies any use over the past few months and was hospitalized for over 2 weeks but notes mention [name] [name] kept leaving the floor). Could likely obtain a repeat transthoracic echocardiogram for initial assessment since this showed possible abnormalities along his tricuspid valve earlier this month as TEE would be higher-risk.   Addendum: Reviewed with Dr. Odis Hollingshead and overall high-risk for TEE given his multiple medical issues as discussed above. Risks of this were reviewed with the [name] [name] as well and [name] [name] prefers a more low-risk approach. Therefore, would recommend rechecking blood cultures and repeating a transthoracic echocardiogram initially. ID consult pending.   5. History of PE/DVT - [name] [name] has been continued on Eliquis 5mg  BID for anticoagulation.   6. Hyponatremia - Appears to be chronic by review of labs from [facility]. Na+ was at 130 on admission, downtrending to 126 today. [name] [name] is scheduled to restart Torsemide 20 mg daily and Spironolactone 25 mg  daily. Would follow closely with this.  For questions or updates, please contact [facility] Please consult [email or web address] for contact info under    Signed, Ellsworth Lennox, PA-C  12/08/2023 8:55 AM  ADDENDUM:   [name] [name] seen and examined with Ellsworth Lennox, PA-C .  I personally taken a history, examined the [name] [name], reviewed relevant notes,  laboratory data / imaging studies.  I performed a substantive portion of this encounter and formulated the important aspects of the plan.  I agree with the APP's note, impression, and recommendations; however, I have edited the note to reflect changes or salient points.   Cardiology is asked to evaluate the [name] [name] for possible TEE to  evaluate for endocarditis given his MRSA bacteremia diagnosed at an outside hospital as mentioned above.  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 [name] [name] endorses chronic orthopnea and PND.  No lower extremity swelling. Has had poor follow-up despite a complex cardiovascular history with regards to outpatient appointments as well as medication.  PHYSICAL EXAM: Today's Vitals   12/08/23 0500 12/08/23 0639 12/08/23 0934 12/08/23 1248  BP:   134/85   Pulse:   94   Resp:   20   Temp:   97.7 F (36.5 C)   TempSrc:   Oral   SpO2:   97%   Weight: 55.8 kg     Height:      PainSc:  10-Worst pain ever  10-Worst pain ever   Body mass index is 16.23 kg/m.   Net IO Since Admission: -1,700 mL [12/08/23 1310]  Filed Weights   12/06/23 2007 12/07/23 0153 12/08/23 0500  Weight: 72.6 kg 63 kg 55.8 kg    Physical Exam  Constitutional: [name] [name] appears cachectic. No distress. [name] [name] appears chronically ill.  hemodynamically stable  HENT:  Decreased range of motion of the neck, poor dentition,  Neck: No JVD present.  Reduced range of motion  Cardiovascular: Normal rate, regular rhythm, S1 normal and S2 normal. Exam reveals no gallop, no S3 and no S4.  No murmur heard. Pulses:      Dorsalis pedis pulses are 2+ on the right side and 2+ on the left side.       Posterior tibial pulses are 0 on the right side and 0  on the left side.  Pulmonary/Chest: No stridor. [name] [name] has no wheezes. [name] [name] has no rales.  Decreased breath sounds bilaterally.  Difficulty sitting upright without assistance.  Abdominal: Soft.  Musculoskeletal:        General: No edema.     Comments: Range of motion reduced.  Neurological: [name] [name] is alert and oriented to person, place, and time.  Skin: Skin is warm and dry.  For hygiene, dry skin    EKG: (personally reviewed by me) 12/06/2023: Sinus rhythm, 95 bpm, PVCs, LVH, without underlying ischemia or injury pattern  Telemetry: (personally reviewed by me) Sinus rhythm without sustained arrhythmia   Impression:  Multivessel coronary artery disease without angina pectoris. Chronic heart failure  with reduced EF, stage C, NYHA class II. Ischemic cardiomyopathy. COPD/emphysema. Smoking 1 pack/day Compression fractures at multiple levels as illustrated by imaging studies MRSA bacteremia, POA-initially diagnosed at an outside facility Illicit drug use Prior history of food impaction needing endoscopy intervention History of DVT/PE Severe protein calorie malnutrition, BMI 16  Recommendations:  Cardiology consulted for consideration of transesophageal echocardiogram as [name] [name] was recently treated at outside hospital facilities for MRSA bacteremia and based on the report of a trans thoracic echo concern fortricuspid valve vegetation.  [name] [name] did receive IV antibiotics at the prior hospital.  Infectious disease to reevaluate the [name] [name].  I spoke to the [name] [name] extensively with regards to the scope of a transesophageal echocardiogram in order to obtain an informed consent.  Given his comorbidities [name] [name] is at higher risk than the general population for adverse side effects which include but not limited to esophageal perforation (rare), gastrointestinal bleeding (rare), cardiac arrhythmia which can include cardiac arrest and death (rare), pharyngeal irritation / discomfort with swallowing /  hematoma, methemoglobinemia, bronchospasm, transient hypoxia, nonsustained ventricular tachycardia, transient atrial fibrillation, minimal hemoptysis, vomiting, hypotension, respiratory compromise, reaction to medications, unavoidable damage to teeth and gums, aspiration pneumonia  were reviewed with the [name] [name].  [name] [name] voices understands and provides verbal feedback.   His comorbidities of multivessel CAD, documented left main disease at 50% back in 2023, per EMR CT surgery recommended medical therapy given ongoing drug use, actively smokes 1 cigarette/day and has underlying COPD and emphysema, has had history of food impaction needing endoscopic intervention, history of DVT and PE on anticoagulation, ischemic cardiomyopathy, chronic HFrEF, medication noncompliance, recurrent drug abuse , cachectic with a BMI of 16, multiple compression fractures & reduced neck movement, etc. All these factors places him at higher risk than the general population for transesophageal echocardiogram.  [name] [name] participated in shared decision making process and would like to hold off on TEE at this time.  Recommend repeating a transthoracic echocardiogram to reevaluate his valves.  [name] [name] had blood cultures on 12/06/2023 results are still pending.  Other alternatives that could be considered are CT of the chest or cardiac MRI to evaluate for endocarditis. Based on the his echo and blood culture will need to discuss with Medicine and ID the safest options of treatment that would minimize his overall risk.   Will discontinue metoprolol given his use of cocaine.  Urine drug screen pending-likely negative as [name] [name] has been in hospital majority of this month.  Uptitrate GDMT as hemodynamics and laboratory values allow.  Overall prognosis is quite guarded for reasons mentioned above  These recommendations were conveyed to attending physician.  Will follow the [name] [name] with you-reviewed the repeat transthoracic echo, uptitration  of GDMT, and reevaluate risk/benefits of a TEE.  Appreciate the recommendations and assistance from primary team and infectious disease.  Further recommendations to follow as the case evolves.   This note was created using a voice recognition software as a result there may be grammatical errors inadvertently enclosed that do not reflect the nature of this encounter. Every attempt is made to correct such errors.   Tessa Lerner, DO, [facility]  [address] [place], [place] [address] Pager: [phone] Office: [phone] 12/08/2023 1:10 PM

## 2023-12-09 ENCOUNTER — Inpatient Hospital Stay (HOSPITAL_COMMUNITY): Payer: MEDICAID

## 2023-12-09 ENCOUNTER — Other Ambulatory Visit: Payer: Self-pay

## 2023-12-09 DIAGNOSIS — R7881 Bacteremia: Secondary | ICD-10-CM

## 2023-12-09 DIAGNOSIS — B9562 Methicillin resistant Staphylococcus aureus infection as the cause of diseases classified elsewhere: Secondary | ICD-10-CM | POA: Diagnosis not present

## 2023-12-09 DIAGNOSIS — G9520 Unspecified cord compression: Secondary | ICD-10-CM

## 2023-12-09 DIAGNOSIS — I5022 Chronic systolic (congestive) heart failure: Secondary | ICD-10-CM | POA: Diagnosis not present

## 2023-12-09 DIAGNOSIS — I079 Rheumatic tricuspid valve disease, unspecified: Secondary | ICD-10-CM

## 2023-12-09 DIAGNOSIS — M25551 Pain in right hip: Secondary | ICD-10-CM

## 2023-12-09 DIAGNOSIS — I2510 Atherosclerotic heart disease of native coronary artery without angina pectoris: Secondary | ICD-10-CM | POA: Diagnosis not present

## 2023-12-09 DIAGNOSIS — E43 Unspecified severe protein-calorie malnutrition: Secondary | ICD-10-CM | POA: Insufficient documentation

## 2023-12-09 LAB — CBC WITH DIFFERENTIAL/PLATELET
Abs Immature Granulocytes: 0.05 10*3/uL (ref 0.00–0.07)
Basophils Absolute: 0.1 10*3/uL (ref 0.0–0.1)
Basophils Relative: 1 %
Eosinophils Absolute: 0.2 10*3/uL (ref 0.0–0.5)
Eosinophils Relative: 2 %
HCT: 36.8 % — ABNORMAL LOW (ref 39.0–52.0)
Hemoglobin: 12.2 g/dL — ABNORMAL LOW (ref 13.0–17.0)
Immature Granulocytes: 1 %
Lymphocytes Relative: 17 %
Lymphs Abs: 1.3 10*3/uL (ref 0.7–4.0)
MCH: 28.8 pg (ref 26.0–34.0)
MCHC: 33.2 g/dL (ref 30.0–36.0)
MCV: 86.8 fL (ref 80.0–100.0)
Monocytes Absolute: 0.6 10*3/uL (ref 0.1–1.0)
Monocytes Relative: 8 %
Neutro Abs: 5.3 10*3/uL (ref 1.7–7.7)
Neutrophils Relative %: 71 %
Platelets: 502 10*3/uL — ABNORMAL HIGH (ref 150–400)
RBC: 4.24 MIL/uL (ref 4.22–5.81)
RDW: 15.9 % — ABNORMAL HIGH (ref 11.5–15.5)
WBC: 7.5 10*3/uL (ref 4.0–10.5)
nRBC: 0 % (ref 0.0–0.2)

## 2023-12-09 LAB — COMPREHENSIVE METABOLIC PANEL
ALT: 25 U/L (ref 0–44)
AST: 31 U/L (ref 15–41)
Albumin: 2.3 g/dL — ABNORMAL LOW (ref 3.5–5.0)
Alkaline Phosphatase: 72 U/L (ref 38–126)
Anion gap: 9 (ref 5–15)
BUN: 14 mg/dL (ref 8–23)
CO2: 27 mmol/L (ref 22–32)
Calcium: 9.2 mg/dL (ref 8.9–10.3)
Chloride: 93 mmol/L — ABNORMAL LOW (ref 98–111)
Creatinine, Ser: 1.04 mg/dL (ref 0.61–1.24)
GFR, Estimated: >60 mL/min (ref >60)
Glucose, Bld: 91 mg/dL (ref 70–99)
Potassium: 3.8 mmol/L (ref 3.5–5.1)
Sodium: 129 mmol/L — ABNORMAL LOW (ref 135–145)
Total Bilirubin: 0.6 mg/dL (ref 0.0–1.2)
Total Protein: 9.1 g/dL — ABNORMAL HIGH (ref 6.5–8.1)

## 2023-12-09 LAB — ECHOCARDIOGRAM COMPLETE
AR max vel: 2.37 cm2
AV Area VTI: 2.31 cm2
AV Area mean vel: 2.15 cm2
AV Mean grad: 3 mmHg
AV Peak grad: 4.7 mmHg
Ao pk vel: 1.08 m/s
Area-P 1/2: 2.96 cm2
Height: 73 in
MV VTI: 2.19 cm2
Weight: 1985.9 [oz_av]

## 2023-12-09 LAB — LIPID PANEL
Cholesterol: 114 mg/dL (ref 0–200)
HDL: 43 mg/dL (ref >40)
LDL Cholesterol: 59 mg/dL (ref 0–99)
Total CHOL/HDL Ratio: 2.7 ratio
Triglycerides: 59 mg/dL (ref <150)
VLDL: 12 mg/dL (ref 0–40)

## 2023-12-09 LAB — MAGNESIUM: Magnesium: 1.8 mg/dL (ref 1.7–2.4)

## 2023-12-09 LAB — BRAIN NATRIURETIC PEPTIDE: B Natriuretic Peptide: 424.6 pg/mL — ABNORMAL HIGH (ref 0.0–100.0)

## 2023-12-09 LAB — C-REACTIVE PROTEIN: CRP: 5.5 mg/dL — ABNORMAL HIGH (ref <1.0)

## 2023-12-09 LAB — PROCALCITONIN: Procalcitonin: <0.1 ng/mL

## 2023-12-09 MED ORDER — SODIUM CHLORIDE 0.9% FLUSH
10.0000 mL | INTRAVENOUS | Status: DC | PRN
  Administered 2023-12-16: 10 mL

## 2023-12-09 MED ORDER — SACCHAROMYCES BOULARDII 250 MG PO CAPS
250.0000 mg | ORAL_CAPSULE | Freq: Two times a day (BID) | ORAL | Status: DC
  Administered 2023-12-09 – 2024-01-04 (×53): 250 mg via ORAL
  Filled 2023-12-09 (×57): qty 1

## 2023-12-09 MED ORDER — VANCOMYCIN HCL 1250 MG/250ML IV SOLN
1250.0000 mg | INTRAVENOUS | Status: DC
  Administered 2023-12-09 – 2023-12-10 (×2): 1250 mg via INTRAVENOUS
  Filled 2023-12-09 (×3): qty 250

## 2023-12-09 MED ORDER — LACTATED RINGERS IV SOLN: INTRAVENOUS | Status: AC

## 2023-12-09 MED ORDER — ADULT MULTIVITAMIN W/MINERALS CH
1.0000 | ORAL_TABLET | Freq: Every day | ORAL | Status: DC
  Administered 2023-12-09 – 2024-01-04 (×27): 1 via ORAL
  Filled 2023-12-09 (×28): qty 1

## 2023-12-09 MED ORDER — CHLORHEXIDINE GLUCONATE CLOTH 2 % EX PADS
6.0000 | MEDICATED_PAD | Freq: Every day | CUTANEOUS | Status: DC
Start: 2023-12-09 — End: 2024-01-05
  Administered 2023-12-09 – 2024-01-04 (×27): 6 via TOPICAL

## 2023-12-09 MED ORDER — ENSURE ENLIVE PO LIQD
237.0000 mL | Freq: Two times a day (BID) | ORAL | Status: DC
  Administered 2023-12-09 – 2023-12-22 (×19): 237 mL via ORAL

## 2023-12-09 MED ORDER — PERFLUTREN LIPID MICROSPHERE
1.0000 mL | INTRAVENOUS | Status: AC | PRN
  Administered 2023-12-09: 2 mL via INTRAVENOUS

## 2023-12-09 MED ORDER — ALPRAZOLAM 0.5 MG PO TABS
0.5000 mg | ORAL_TABLET | Freq: Once | ORAL | Status: AC
  Administered 2023-12-09: 0.5 mg via ORAL
  Filled 2023-12-09: qty 1

## 2023-12-09 NOTE — Progress Notes (Signed)
 Peripherally Inserted Central Catheter Placement  The IV Nurse has discussed with the patient and/or persons authorized to consent for the patient, the purpose of this procedure and the potential benefits and risks involved with this procedure.  The benefits include less needle sticks, lab draws from the catheter, and the patient may be discharged home with the catheter. Risks include, but not limited to, infection, bleeding, blood clot (thrombus formation), and puncture of an artery; nerve damage and irregular heartbeat and possibility to perform a PICC exchange if needed/ordered by physician.  Alternatives to this procedure were also discussed.  Bard Power PICC patient education guide, fact sheet on infection prevention and patient information card has been provided to patient /or left at bedside.    PICC Placement Documentation  PICC Single Lumen 12/09/23 Right Brachial 41 cm 0 cm (Active)  Indication for Insertion or Continuance of Line Limited venous access - need for IV therapy >5 days (PICC only) 12/09/23 0925  Exposed Catheter (cm) 0 cm 12/09/23 0925  Site Assessment Clean, Dry, Intact 12/09/23 0925  Line Status Flushed;Saline locked;Blood return noted 12/09/23 0925  Dressing Type Transparent;Securing device 12/09/23 0925  Dressing Status Antimicrobial disc/dressing in place;Clean, Dry, Intact 12/09/23 0925  Line Care Connections checked and tightened 12/09/23 0925  Line Adjustment (NICU/IV Team Only) No 12/09/23 0925  Dressing Intervention New dressing;Adhesive placed at insertion site (IV team only) 12/09/23 0925  Dressing Change Due 12/16/23 12/09/23 0925       Reginia Forts Albarece 12/09/2023, 10:20 AM

## 2023-12-09 NOTE — Progress Notes (Addendum)
 PROGRESS NOTE                                                                                                                                                                                                             Patient Demographics:    [name] [name], is a 63 y.o. male, DOB - 28-Dec-1960, UJW:[id number]  Outpatient Primary MD for the patient is Patient, No Pcp Per    LOS - 2  Admit date - 12/06/2023    Chief Complaint  Patient presents with   Abdominal Pain       Brief Narrative (HPI from H&P)    63 y.o. 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   He did not want to go back to [facility], he was also due for a TEE and was admitted to the hospital here for further care.   Subjective:    [name] [name] today has, No headache, diffuse aches and pains in his abdomen and chest wall all over, No new weakness tingling or numbness, no SOB,    Assessment  & Plan :    MRSA bacteremia, POA - TTE ([facility]) revealed LVEF 20-25%, tricuspid valve appears thickened with possible mobile component, cardiology consulted, his risk for an adverse outcome during TEE is too high due to underlying CAD, EF of 20% and extremely frail, deconditioned status & history of esophageal stricture, since we already have evidence of tricuspid endocarditis probably will repeat TTE at this time per cardiology.    For now continue IV Vancomycin or oral Zyvox depending on his IV access as he is refusing PICC line, defer course and duration of antibiotics to ID.  He has remote history of IV drug use and  methamphetamine use, currently denies all use.  UDS negative here.  Per ID patient to get MRI brain, C-spine and of the right hip, refused once but now is agreeable we will try to do it on 12/09/2023.    Of note patient was to get Dalbavancin prior to his hospital discharge [facility] but  appears that he never received it.    Lower back pain, compression fractures in lumbar spine vertebrae, T12, L2-L3 L5 - repeat MRI L-spine this admission stable, supportive care.  No signs of acute infection   Lactic acidosis - due to dehydration.  Has been hydrated.   Chronic HFrEF - LVEF 20 to 25%, Euvolemic on exam, home diuretics resumed cardiology to evaluate.   Possible acute pyelonephritis seen on CT scan, POA  - Continue IV antibiotics, As needed analgesics, follow urine culture.   History of PE/DVT on Eliquis  Resume home Eliquis also has IVC filter.   History of polysubstance abuse  Denies recent use of IV drugs   Tobacco use disorder  - Current smoker, Nicotine patch   Generalized weakness  PT OT assessment, Fall precautions   Severe protein calorie malnutrition - Dietitian consult, protein supplementation  Incidental finding of right inguinal hernia.  No acute issues.  Monitor  No IV access.  PICC line requested 12/08/2023, patient refused, counseled him and he finally agreed for it on 12/09/2023.      Condition - Extremely Guarded  Family Communication  :  None present  Code Status :   Full  Consults  :  ID, Cards  PUD Prophylaxis :  Pepcid   Procedures  :     TTE -    MRI L-spine.  1. Chronic compression fractures of T12, L2, L3, and L5 with no complicating features. 2. Subtle and nonspecific bilateral erector spinae muscle edema. A similar appearance can be seen in chronically debilitated patients. 3. 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 4. Capacious lumbar  spinal canal with no spinal stenosis  CT scan abdomen pelvis.1. Emphysematous changes and chronic bronchitic changes in the lung bases. Trace bilateral pleural effusions. 2. Heterogeneous nephrograms bilaterally suggesting acute pyelonephritis. No abscess or hydronephrosis. 3. Moderate-sized right inguinal hernia containing bowel without obvious proximal obstruction. Visualization is limited due to streak artifact. 4. Aortic atherosclerosis.  Inferior vena caval filter. 5. Small amount of free fluid in the abdomen of nonspecific etiology      Disposition Plan  :    Status is: Inpatient   DVT Prophylaxis  :     apixaban (ELIQUIS) tablet 5 mg     Lab Results  Component Value Date   PLT 502 (H) 12/09/2023    Diet :  Diet Order             Diet Heart Room service appropriate? Yes; Fluid consistency: Thin  Diet effective now                    Inpatient Medications  Scheduled Meds:  apixaban  5 mg Oral BID   atorvastatin  80 mg Oral QHS   Chlorhexidine Gluconate Cloth  6 each Topical Daily   famotidine  20 mg Oral Daily   linezolid  600 mg Oral Q12H   spironolactone  25 mg Oral Daily   torsemide  20 mg Oral Daily   Continuous Infusions:  lactated ringers     magnesium sulfate bolus IVPB Stopped (12/08/23 1449)   PRN Meds:.acetaminophen, HYDROmorphone (DILAUDID) injection, LORazepam, melatonin, naLOXone (NARCAN)  injection, oxyCODONE, polyethylene glycol, prochlorperazine, sodium chloride flush  Antibiotics  :    Anti-infectives (From admission, onward)    Start     Dose/Rate Route Frequency Ordered Stop   12/08/23 1245  linezolid (ZYVOX)  tablet 600 mg        600 mg Oral Every 12 hours 12/08/23 1159     12/08/23 1000  cefTRIAXone (ROCEPHIN) 1 g in sodium chloride 0.9 % 100 mL IVPB  Status:  Discontinued        1 g 200 mL/hr over 30 Minutes Intravenous Every 24 hours 12/08/23 0912 12/09/23 1001   12/08/23 0600  vancomycin (VANCOREADY) IVPB 1250 mg/250 mL   Status:  Discontinued        1,250 mg 166.7 mL/hr over 90 Minutes Intravenous Every 24 hours 12/07/23 1107 12/08/23 1159   12/07/23 0600  vancomycin (VANCOCIN) IVPB 1000 mg/200 mL premix  Status:  Discontinued        1,000 mg 200 mL/hr over 60 Minutes Intravenous Every 24 hours 12/07/23 0105 12/07/23 1107         Objective:   Vitals:   12/09/23 0000 12/09/23 0417 12/09/23 0423 12/09/23 0800  BP: 135/76 123/81  106/75  Pulse: 74 74  91  Resp: 18 19  18   Temp:  97.6 F (36.4 C)  97.7 F (36.5 C)  TempSrc:  Oral  Oral  SpO2: 95% 96%  98%  Weight:   56.3 kg   Height:        Wt Readings from Last 3 Encounters:  12/09/23 56.3 kg  10/23/18 72.6 kg  09/09/18 70.3 kg     Intake/Output Summary (Last 24 hours) at 12/09/2023 1051 Last data filed at 12/08/2023 2300 Gross per 24 hour  Intake 400 ml  Output 2250 ml  Net -1850 ml     Physical Exam  Awake Alert, No new F.N deficits, Normal affect [place].AT,PERRAL Supple Neck, No JVD,   Symmetrical Chest wall movement, Good air movement bilaterally, CTAB RRR  +ve B.Sounds, Abd Soft, No tenderness,   No Cyanosis, Clubbing or edema      Data Review:    Recent Labs  Lab 12/06/23 2028 12/07/23 0455 12/08/23 0522 12/09/23 0533  WBC 11.0* 7.8 7.9 7.5  HGB 11.8* 9.8* 10.7* 12.2*  HCT 36.6* 29.8* 32.2* 36.8*  PLT 432* 403* 436* 502*  MCV 89.9 86.6 85.9 86.8  MCH 29.0 28.5 28.5 28.8  MCHC 32.2 32.9 33.2 33.2  RDW 16.2* 16.4* 16.0* 15.9*  LYMPHSABS 0.9  --  0.9 1.3  MONOABS 0.9  --  0.8 0.6  EOSABS 0.0  --  0.1 0.2  BASOSABS 0.0  --  0.0 0.1    Recent Labs  Lab 12/06/23 2028 12/06/23 2103 12/06/23 2203 12/06/23 2359 12/07/23 0637 12/07/23 0641 12/07/23 1500 12/08/23 0522 12/08/23 0556 12/09/23 0533  NA 130*  --   --   --   --  128*  --  126*  --  129*  K 4.2  --   --   --   --  4.2  --  3.9  --  3.8  CL 96*  --   --   --   --  98  --  97*  --  93*  CO2 19*  --   --   --   --  22  --  23  --  27  ANIONGAP 15   --   --   --   --  8  --  6  --  9  GLUCOSE 80  --   --   --   --  102*  --  91  --  91  BUN 14  --   --   --   --  12  --  10  --  14  CREATININE 1.05  --   --   --   --  0.90  --  0.78  --  1.04  AST 31  --   --   --   --   --   --   --   --  31  ALT 27  --   --   --   --   --   --   --   --  25  ALKPHOS 70  --   --   --   --   --   --   --   --  72  BILITOT 0.8  --   --   --   --   --   --   --   --  0.6  ALBUMIN 2.3*  --   --   --   --   --   --   --   --  2.3*  CRP  --   --   --   --   --   --   --   --  5.9* 5.5*  PROCALCITON  --   --   --   --   --   --   --  <0.10  --  <0.10  LATICACIDVEN  --  2.5*  --  2.3* 1.8  --  1.6  --   --   --   INR  --   --  1.6*  --   --   --   --   --   --   --   BNP  --   --   --   --   --   --   --   --  965.0* 424.6*  MG  --   --   --   --   --  1.6*  --  1.6*  --  1.8  PHOS  --   --   --   --   --  2.9  --   --   --   --   CALCIUM 9.1  --   --   --   --  8.7*  --  8.5*  --  9.2      Recent Labs  Lab 12/06/23 2028 12/06/23 2103 12/06/23 2203 12/06/23 2359 12/07/23 0637 12/07/23 0641 12/07/23 1500 12/08/23 0522 12/08/23 0556 12/09/23 0533  CRP  --   --   --   --   --   --   --   --  5.9* 5.5*  PROCALCITON  --   --   --   --   --   --   --  <0.10  --  <0.10  LATICACIDVEN  --  2.5*  --  2.3* 1.8  --  1.6  --   --   --   INR  --   --  1.6*  --   --   --   --   --   --   --   BNP  --   --   --   --   --   --   --   --  965.0* 424.6*  MG  --   --   --   --   --  1.6*  --  1.6*  --  1.8  CALCIUM 9.1  --   --   --   --  8.7*  --  8.5*  --  9.2    --------------------------------------------------------------------------------------------------------------- Lab Results  Component Value Date   CHOL 114 12/09/2023   HDL 43 12/09/2023   LDLCALC 59 12/09/2023   TRIG 59 12/09/2023   CHOLHDL 2.7 12/09/2023    No results found for: "HGBA1C" No results for input(s): "TSH", "T4TOTAL", "FREET4", "T3FREE", "THYROIDAB" in the last 72 hours. No  results for input(s): "VITAMINB12", "FOLATE", "FERRITIN", "TIBC", "IRON", "RETICCTPCT" in the last 72 hours. ------------------------------------------------------------------------------------------------------------------ Cardiac Enzymes No results for input(s): "CKMB", "TROPONINI", "MYOGLOBIN" in the last 168 hours.  Invalid input(s): "CK"  Micro Results Recent Results (from the past 240 hours)  Culture, blood (Routine x 2)     Status: None (Preliminary result)   Collection Time: 12/06/23  8:28 PM   Specimen: BLOOD LEFT FOREARM  Result Value Ref Range Status   Specimen Description BLOOD LEFT FOREARM  Final   Special Requests   Final    BOTTLES DRAWN AEROBIC ONLY Blood Culture results may not be optimal due to an inadequate volume of blood received in culture bottles   Culture   Final    NO GROWTH 3 DAYS Performed at [facility], [address] [place], [place] [address]    Report Status PENDING  Incomplete  Culture, blood (Routine x 2)     Status: None (Preliminary result)   Collection Time: 12/06/23  8:33 PM   Specimen: BLOOD RIGHT FOREARM  Result Value Ref Range Status   Specimen Description BLOOD RIGHT FOREARM  Final   Special Requests   Final    BOTTLES DRAWN AEROBIC AND ANAEROBIC Blood Culture results may not be optimal due to an inadequate volume of blood received in culture bottles   Culture   Final    NO GROWTH 3 DAYS Performed at [facility], [address], [place], [place] [address]    Report Status PENDING  Incomplete    Radiology Report [place] EKG SITE RITE Result Date: 12/09/2023 If Site Rite image not attached, placement could not be confirmed due to current cardiac rhythm.  DG Chest Port 1 View Result Date: 12/08/2023 CLINICAL DATA:  Shortness of breath. EXAM: PORTABLE CHEST 1 VIEW COMPARISON:  12/07/2023. FINDINGS: Patient is rotated. Trachea is midline. Heart size stable. Bibasilar streaky atelectasis. No airspace consolidation. There may be  trace bilateral pleural effusions. IMPRESSION: 1. Trace bilateral pleural effusions. 2. Bibasilar streaky atelectasis. Electronically Signed   By: Leanna Battles M.D.   On: 12/08/2023 09:29   [place] EKG SITE RITE Result Date: 12/08/2023 If [facility] image not attached, placement could not be confirmed due to current cardiac rhythm.    Signature  -   Susa Raring M.D on 12/09/2023 at 10:51 AM   -  To page go to [email or web address]

## 2023-12-09 NOTE — Evaluation (Signed)
 Physical Therapy Evaluation Patient Details Name: [name] [name] MRN: [id number] DOB: 1961-05-11 Today's Date: 12/09/2023  History of Present Illness  Pt is a 63 y/o M presenting to ED on 3/21 with abdominal/back pain, recent admission at [facility] on 3/8 with MRSA bacteremia with concerns for endocarditis/osteomyelitis. 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 Mod I with mobility using a rollator PTA. Pt reports currently in transition between living spaces and nothing confirmed at this time but is hoping to find an ALF to transition to. Required min assist for bed mobility and transfer, demonstrating posterior loss of balance. Tolerated short distance gait at CGA level. Easily agitated but mostly pleasant during interaction today. Patient will benefit from continued inpatient follow up therapy, <3 hours/day.  Pt currently with functional limitations due to the deficits listed below (see PT Problem List). Pt will benefit from acute skilled PT to increase their independence and safety with mobility to allow discharge.           If plan is discharge home, recommend the following: A little help with walking and/or transfers;A little help with bathing/dressing/bathroom;Assistance with cooking/housework;Assist for transportation   Can travel by private vehicle   Yes    Equipment Recommendations None recommended by PT  Recommendations for Other Services       Functional Status Assessment Patient has had a recent decline in their functional status and demonstrates the ability to make significant improvements in function in a reasonable and predictable amount of time.     Precautions / Restrictions Precautions Precautions: Fall Precaution/Restrictions Comments: back prec for comfort Restrictions Weight Bearing Restrictions Per Provider Order:  No      Mobility  Bed Mobility Overal bed mobility: Needs Assistance Bed Mobility: Supine to Sit     Supine to sit: Min assist     General bed mobility comments: educated on log roll technique, min assist to rise safely to EOB.    Transfers Overall transfer level: Needs assistance Equipment used: Rollator (4 wheels) Transfers: Sit to/from Stand Sit to Stand: Min assist           General transfer comment: Min assist for balance. Performed x2 from bed. Cues for technique and awareness.    Ambulation/Gait Ambulation/Gait assistance: Contact guard assist, +2 safety/equipment Gait Distance (Feet): 30 Feet Assistive device: Rollator (4 wheels) Gait Pattern/deviations: Step-through pattern, Decreased stride length, Decreased dorsiflexion - right, Antalgic, Drifts right/left Gait velocity: dec Gait velocity interpretation: <1.31 ft/sec, indicative of household ambulator   General Gait Details: CGA for safety, managed lines/leads with +2 for safety/follow. No overt LOB with rollator. Very slow and guarded, walks on ball of Rt foot, unable to fully extend RLE.  Stairs            Wheelchair Mobility     Tilt Bed    Modified Rankin (Stroke Patients Only)       Balance Overall balance assessment: Needs assistance Sitting-balance support: Feet supported Sitting balance-Leahy Scale: Fair     Standing balance support: During functional activity Standing balance-Leahy Scale: Poor Standing balance comment: post lean in standing initially                             Pertinent Vitals/Pain Pain Assessment Pain Assessment: Faces Faces Pain Scale: Hurts even more Pain Location: Rt hip Pain Descriptors /  Indicators: Aching Pain Intervention(s): Monitored during session, Limited activity within patient's tolerance, Repositioned, Premedicated before session    Home Living Family/patient expects to be discharged to:: Shelter/Homeless (Trying to get into  ALF) Living Arrangements: Alone               Home Equipment: Rollator (4 wheels)      Prior Function Prior Level of Function : Independent/Modified Independent             Mobility Comments: ind with rollator, takes extra time ADLs Comments: Effortful, takes extra time, no assist     Extremity/Trunk Assessment   Upper Extremity Assessment Upper Extremity Assessment: Defer to OT evaluation    Lower Extremity Assessment Lower Extremity Assessment: Generalized weakness;RLE deficits/detail RLE Deficits / Details: RLE guarded, likes to externally rotate.    Cervical / Trunk Assessment Cervical / Trunk Assessment: Kyphotic  Communication   Communication Communication: No apparent difficulties    Cognition Arousal: Alert Behavior During Therapy: WFL for tasks assessed/performed, Agitated (easily agitated)   PT - Cognitive impairments: No apparent impairments                         Following commands: Intact       Cueing Cueing Techniques: Verbal cues     General Comments General comments (skin integrity, edema, etc.): vss    Exercises     Assessment/Plan    PT Assessment Patient needs continued PT services  PT Problem List         PT Treatment Interventions      PT Goals (Current goals can be found in the Care Plan section)  Acute Rehab PT Goals Patient Stated Goal: Get into an ALF PT Goal Formulation: With patient Time For Goal Achievement: 12/23/23 Potential to Achieve Goals: Fair    Frequency       Co-evaluation PT/OT/SLP Co-Evaluation/Treatment: Yes Reason for Co-Treatment: For patient/therapist safety;To address functional/ADL transfers PT goals addressed during session: Mobility/safety with mobility;Balance;Proper use of DME OT goals addressed during session: ADL's and self-care       AM-PAC PT "6 Clicks" Mobility  Outcome Measure                  End of Session Equipment Utilized During Treatment:  (Refused  gait belt) Activity Tolerance: Patient tolerated treatment well Patient left: in chair;with call bell/phone within reach;with chair alarm set   PT Visit Diagnosis: Unsteadiness on feet (R26.81);Other abnormalities of gait and mobility (R26.89);Muscle weakness (generalized) (M62.81);Pain    Time: 1010-1033 PT Time Calculation (min) (ACUTE ONLY): 23 min   Charges:   PT Evaluation $PT Eval Low Complexity: 1 Low   PT General Charges $$ ACUTE PT VISIT: 1 Visit         Kathlyn Sacramento, PT, DPT [facility]  Rehabilitation Services Physical Therapist Office: [phone] Website: [email or web address]   Berton Mount 12/09/2023, 12:00 PM

## 2023-12-09 NOTE — Evaluation (Signed)
 Occupational Therapy Evaluation Patient Details Name: [name] [name] MRN: [id number] DOB: 1961-03-07 Today's Date: 12/09/2023   History of Present Illness   Pt is a 63 y/o M presenting to ED on 3/21 with abdominal/back pain, recent admission at [facility] on 3/8 with MRSA bacteremia with concerns for endocarditis/osteomyelitis. 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 Pt needing up to mod A for ADLs, min A for bed mobility and CGA for transfers with rollator. Pt with initial posterior LOB needing min A+2 to correct. Pt presenting with impairments listed below, will follow acutely. Patient will benefit from continued inpatient follow up therapy, <3 hours/day to maximize safety/ind with ADL/functional mobility.      If plan is discharge home, recommend the following:   A little help with walking and/or transfers;A lot of help with bathing/dressing/bathroom;Direct supervision/assist for medications management;Direct supervision/assist for financial management;Assist for transportation     Functional Status Assessment   Patient has had a recent decline in their functional status and demonstrates the ability to make significant improvements in function in a reasonable and predictable amount of time.     Equipment Recommendations   Other (comment) (defer)     Recommendations for Other Services   PT consult     Precautions/Restrictions   Precautions Precautions: Fall Precaution/Restrictions Comments: back prec for comfort Restrictions Weight Bearing Restrictions Per Provider Order: No     Mobility Bed Mobility Overal bed mobility: Needs Assistance Bed Mobility: Supine to Sit     Supine to sit: Min assist     General  bed mobility comments: via log roll technique    Transfers Overall transfer level: Needs assistance Equipment used: Rollator (4 wheels) Transfers: Sit to/from Stand Sit to Stand: Contact guard assist, Min assist           General transfer comment: initial posterior LOB      Balance Overall balance assessment: Needs assistance Sitting-balance support: Feet supported Sitting balance-Leahy Scale: Fair     Standing balance support: During functional activity Standing balance-Leahy Scale: Poor Standing balance comment: post lean in standing initially                           ADL either performed or assessed with clinical judgement   ADL Overall ADL's : Needs assistance/impaired Eating/Feeding: Supervision/ safety   Grooming: Supervision/safety   Upper Body Bathing: Minimal assistance   Lower Body Bathing: Moderate assistance   Upper Body Dressing : Minimal assistance   Lower Body Dressing: Moderate assistance   Toilet Transfer: Rollator (4 wheels);Ambulation;Contact guard assist           Functional mobility during ADLs: Rollator (4 wheels);Contact guard assist       Vision   Vision Assessment?: No apparent visual deficits     Perception Perception: Not tested       Praxis Praxis: Not tested       Pertinent Vitals/Pain       Extremity/Trunk Assessment Upper Extremity Assessment Upper Extremity Assessment: Defer to OT evaluation   Lower Extremity Assessment Lower Extremity Assessment: Generalized weakness;RLE deficits/detail RLE Deficits / Details: RLE guarded, likes to externally rotate.   Cervical / Trunk Assessment Cervical / Trunk Assessment: Kyphotic   Communication  Communication Communication: No apparent difficulties   Cognition     Cognition: No family/caregiver present to determine baseline             OT - Cognition Comments: mild impulsivity, declines gait belt or hands on assist despite instability                          Cueing  General Comments   Cueing Techniques: Verbal cues  vss   Exercises     Shoulder Instructions      Home Living Family/patient expects to be discharged to:: Shelter/Homeless Living Arrangements: Alone                           Home Equipment: Rollator (4 wheels)          Prior Functioning/Environment Prior Level of Function : Independent/Modified Independent             Mobility Comments: ind with rollator, takes extra time ADLs Comments: Effortful, takes extra time, no assist    OT Problem List: Decreased strength;Decreased range of motion;Decreased activity tolerance;Impaired balance (sitting and/or standing);Decreased cognition;Decreased safety awareness   OT Treatment/Interventions: Self-care/ADL training;Therapeutic exercise;Energy conservation;DME and/or AE instruction;Therapeutic activities;Balance training;Patient/family education      OT Goals(Current goals can be found in the care plan section)   Acute Rehab OT Goals Patient Stated Goal: did not state OT Goal Formulation: With patient Time For Goal Achievement: 12/23/23 Potential to Achieve Goals: Good   OT Frequency:  Min 2X/week    Co-evaluation PT/OT/SLP Co-Evaluation/Treatment: Yes Reason for Co-Treatment: For patient/therapist safety;To address functional/ADL transfers PT goals addressed during session: Mobility/safety with mobility;Balance;Proper use of DME OT goals addressed during session: ADL's and self-care      AM-PAC OT "6 Clicks" Daily Activity     Outcome Measure Help from another person eating meals?: A Little Help from another person taking care of personal grooming?: A Little Help from another person toileting, which includes using toliet, bedpan, or urinal?: A Little Help from another person bathing (including washing, rinsing, drying)?: A Lot Help from another person to put on and taking off regular upper body clothing?: A  Little Help from another person to put on and taking off regular lower body clothing?: A Lot 6 Click Score: 16   End of Session Equipment Utilized During Treatment: Rollator (4 wheels) Nurse Communication: Mobility status;Other (comment) (blood area on bed)  Activity Tolerance: Patient tolerated treatment well Patient left: in chair;with call bell/phone within reach;with chair alarm set  OT Visit Diagnosis: Unsteadiness on feet (R26.81);Other abnormalities of gait and mobility (R26.89);Muscle weakness (generalized) (M62.81)                Time: 1010-1033 OT Time Calculation (min): 23 min Charges:  OT General Charges $OT Visit: 1 Visit OT Evaluation $OT Eval Moderate Complexity: 1 Mod  Alizay Bronkema K, OTD, OTR/L [employer] [facility] Acute Rehab [phone]   Carver Fila Koonce 12/09/2023, 12:27 PM

## 2023-12-09 NOTE — TOC CM/SW Note (Signed)
 Transition of Care [facility]) - Inpatient Brief Assessment   Patient Details  Name: [name] MRN: [id number] Date of Birth: [birth year]  [facility] CM/SW Contact:    Mearl Latin, LCSW Phone Number: 12/09/2023, 12:22 PM   Clinical Narrative: CSW received SNF consult. Multiple barriers in place including insurance, homelessness, and substance use history; patient recently released from [facility].    Transition of Care Asessment: Insurance and Status: Insurance coverage has been reviewed Patient has primary care physician: No Home environment has been reviewed: Homeless Prior level of function:: Audiological scientist Home Services: No current home services Social Drivers of Health Review: SDOH reviewed interventions complete Readmission risk has been reviewed: Yes Transition of care needs: transition of care needs identified, TOC will continue to follow

## 2023-12-09 NOTE — Progress Notes (Signed)
 Patient refused for dressing change again told will do later at night.

## 2023-12-09 NOTE — Consult Note (Signed)
 WOC Nurse Consult Note: Reason for Consult: Stage 2 sacrum  Wound type: Stage 2 Pressure Injury  Pressure Injury POA: Yes Measurement: patient currently refusing to have assessed  Wound bed: charted as pink  Drainage (amount, consistency, odor) blood on sheets per RN note  Periwound: Dressing procedure/placement/frequency:  Cleanse wound to sacrum with NS, apply Xeroform gauze [place] [phone]) to wound bed daily, cover with silicone foam. May lift foam daily to replace Xeroform. Change silicone foam q3 days and prn soiling.   When patient is agreeable to photo/assessment by bedside RN if any necrotic tissue noted (yellow/tan/brown/black/gray) please re-consult WOC team.    Thank you,    Priscella Mann MSN, RN-BC, CWOCN [phone]

## 2023-12-09 NOTE — Progress Notes (Signed)
 [facility] Infectious Diseases Follow Up Note  Patient Identification: Patient Name: [name] [name] MRN: [id number] Admit Date: 12/06/2023  7:52 PM Age: 63 y.o.Today's Date: 12/09/2023  Reason for Visit: MRSA bacteremia, Endocarditis  Principal Problem:   MRSA bacteremia Active Problems:   Acute midline back pain   Compression of lumbar vertebra (HCC)   Chronic multifocal osteomyelitis of right femur (HCC)   Neck pain   Septic embolism (HCC)   Cachexia (HCC)   Homeless   IVDU (intravenous drug user)   Endocarditis of tricuspid valve   Nonintractable headache   Atherosclerosis of native coronary artery of native heart without angina pectoris   Chronic HFrEF (heart failure with reduced ejection fraction) (HCC)   Ischemic cardiomyopathy   Chronic obstructive pulmonary disease (HCC)   Pulmonary emphysema (HCC)   Cigarette smoker   Compression fracture of body of thoracic vertebra (HCC)   Hx of deep venous thrombosis   Hx pulmonary embolism   Protein-calorie malnutrition (HCC)   Antibiotics: Vancomycin 3/21 Linezolid 3/23-  Lines/Hardwares: Right arm PICC placed 3/24.  Right hip arthroplasty  Interval Events: Remains afebrile,  Labs remarkable for NA 129, BNP down to 424   Assessment 63 year old male with history of polysubstance abuse, anxiety/depression, homelessness, COPD, peripheral neuropathy, chronic hepatitis C, CHFrEF, VTE on [facility] admitted with  # MRSA bacteremia # Native tricuspid valve endocarditis # Chronic compression fractures of T12, L2, L3 and L5 with no complicating features # RT hip pain r/o septic arthritis  # CT with heterogeneous nephrograms bilaterally suggestive of acute pyelonephritis - Ua is unremarkable for UTI with no GU symptoms.  No indication to treat   Recommendations Will switch linezolid to vancomycin, pharmacy to dose now he has PICC.  Follow-up blood cultures Follow-up MRI ordered by  Dr. Algis Liming including right hip MRI.  Will also need to be evaluated by orthopedics for his right hip HCVRNA and hepatitis B serology ordered Monitor CBC, BMP and vancomycin trough Contact precautions   Rest of the management as per the primary team. Thank you for the consult. Please page with pertinent questions or concerns.  ______________________________________________________________________ Subjective patient seen and examined at the bedside.  Still has right hip pain, mid lower back pain+, no neck pain today ( was present before).   Past Medical History:  Diagnosis Date   Alcoholic (HCC)    Anxiety    COPD (chronic obstructive pulmonary disease) (HCC)    Depression    Hepatitis C    History of blood clots    Homelessness    Malingering    Peripheral neuropathy    Polysubstance abuse (HCC)    Past Surgical History:  Procedure Laterality Date   ABDOMINAL SURGERY     HIP SURGERY     Vitals BP 106/75 (BP Location: Left Arm)   Pulse 91   Temp 97.7 F (36.5 C) (Oral)   Resp 18   Ht 6\' 1"  (1.854 m)   Wt 56.3 kg   SpO2 98%   BMI 16.38 kg/m      Physical Exam Constitutional: Thin appearing and malnourished    Comments: Not in acute distress, HEENT WNL  Cardiovascular:     Rate and Rhythm: Normal rate and regular rhythm.     Heart sounds: S1 and S2  Pulmonary:     Effort: Pulmonary effort is normal.     Comments: Normal breath sounds  Abdominal:     Palpations: Abdomen is soft.     Tenderness: Nondistended and nontender  Musculoskeletal:        General: some tenderness in the mid lower back but no C-spine tenderness.  Range of motion is difficult in the right hip/thigh due to pain.  No signs of septic joint in other peripheral joints  Skin:    Comments: No rashes  Neurological:     General: Awake, alert and oriented, grossly nonfocal and follows commands  Psychiatric:        Mood and Affect: Mood normal.   Pertinent Microbiology Results for orders  placed or performed during the hospital encounter of 12/06/23  Culture, blood (Routine x 2)     Status: None (Preliminary result)   Collection Time: 12/06/23  8:28 PM   Specimen: BLOOD LEFT FOREARM  Result Value Ref Range Status   Specimen Description BLOOD LEFT FOREARM  Final   Special Requests   Final    BOTTLES DRAWN AEROBIC ONLY Blood Culture results may not be optimal due to an inadequate volume of blood received in culture bottles   Culture   Final    NO GROWTH 3 DAYS Performed at [facility], [address], [place], [place] [address]    Report Status PENDING  Incomplete  Culture, blood (Routine x 2)     Status: None (Preliminary result)   Collection Time: 12/06/23  8:33 PM   Specimen: BLOOD RIGHT FOREARM  Result Value Ref Range Status   Specimen Description BLOOD RIGHT FOREARM  Final   Special Requests   Final    BOTTLES DRAWN AEROBIC AND ANAEROBIC Blood Culture results may not be optimal due to an inadequate volume of blood received in culture bottles   Culture   Final    NO GROWTH 3 DAYS Performed at [facility], [address] [place], [place] [address]    Report Status PENDING  Incomplete   Pertinent Lab.    Latest Ref Rng & Units 12/09/2023    5:33 AM 12/08/2023    5:22 AM 12/07/2023    4:55 AM  CBC  WBC 4.0 - 10.5 K/uL 7.5  7.9  7.8   Hemoglobin 13.0 - 17.0 g/dL 09.8  11.9  9.8   Hematocrit 39.0 - 52.0 % 36.8  32.2  29.8   Platelets 150 - 400 K/uL 502  436  403       Latest Ref Rng & Units 12/09/2023    5:33 AM 12/08/2023    5:22 AM 12/07/2023    6:41 AM  CMP  Glucose 70 - 99 mg/dL 91  91  147   BUN 8 - 23 mg/dL 14  10  12    Creatinine 0.61 - 1.24 mg/dL 8.29  5.62  1.30   Sodium 135 - 145 mmol/L 129  126  128   Potassium 3.5 - 5.1 mmol/L 3.8  3.9  4.2   Chloride 98 - 111 mmol/L 93  97  98   CO2 22 - 32 mmol/L 27  23  22    Calcium 8.9 - 10.3 mg/dL 9.2  8.5  8.7   Total Protein 6.5 - 8.1 g/dL 9.1     Total Bilirubin 0.0 - 1.2 mg/dL 0.6      Alkaline Phos 38 - 126 U/L 72     AST 15 - 41 U/L 31     ALT 0 - 44 U/L 25        Pertinent Imaging today Plain films and CT images have been personally visualized and interpreted; radiology reports have been reviewed.  Decision making incorporated into the Impression /   [place] EKG SITE RITE Result Date: 12/09/2023 If Site Rite image not attached, placement could not be confirmed due to current cardiac rhythm.  DG Chest Port 1 View Result Date: 12/08/2023 CLINICAL DATA:  Shortness of breath. EXAM: PORTABLE CHEST 1 VIEW COMPARISON:  12/07/2023. FINDINGS: Patient is rotated. Trachea is midline. Heart size stable. Bibasilar streaky atelectasis. No airspace consolidation. There may be trace bilateral pleural effusions. IMPRESSION: 1. Trace bilateral pleural effusions. 2. Bibasilar streaky atelectasis. Electronically Signed   By: Leanna Battles M.D.   On: 12/08/2023 09:29   [place] EKG SITE RITE Result Date: 12/08/2023 If [facility] image not attached, placement could not be confirmed due to current cardiac rhythm.  MR LUMBAR SPINE WO CONTRAST Result Date: 12/07/2023 CLINICAL DATA:  63 year old male with pain, age indeterminate lumbar compression fractures. EXAM: MRI LUMBAR SPINE WITHOUT CONTRAST TECHNIQUE: Multiplanar, multisequence MR imaging of the lumbar spine was performed. No intravenous contrast was administered. COMPARISON:  Lumbar spine CT last night. FINDINGS: Segmentation:  Normal on the comparison. Alignment: Stable, normal lumbar lordosis. No significant scoliosis or spondylolisthesis. Vertebrae: Background bone marrow signal is normal and there are chronic compression fractures of T12, L2 (moderate), L3, and L5. Minimal retropulsion, such as at the L2 posterosuperior endplate. Superimposed mild degenerative appearing endplate marrow edema posteriorly at L2-L3. Intact visible sacrum and SI joints. No other acute osseous abnormality. Conus medullaris and cauda equina: Conus extends to the T12-L1  level. No lower spinal cord or conus signal abnormality. Normal cauda equina nerve roots. Capacious spinal canal. Paraspinal and other soft tissues: Susceptibility artifact anterior to the right sacrum related to pronounced right acetabular protrusion in the setting of hip arthroplasty, see CT Abdomen and Pelvis last night. Stable visible abdominal viscera. Mild symmetric and nonspecific bilateral erector spinae muscle STIR hyperintensity, such as on the left side series 4, image 19. No paraspinal fluid collection. Disc levels: Capacious spinal canal with no age advanced lumbar spine degeneration when considering the presence of multiple compression fractures. No lumbar spinal stenosis. There is mild bilateral L4 and left L5 degenerative neural foraminal stenosis. IMPRESSION: 1. Chronic compression fractures of T12, L2, L3, and L5 with no complicating features. 2. Subtle and nonspecific bilateral erector spinae muscle edema. A similar appearance can be seen in chronically debilitated patients. 3. 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 4. Capacious lumbar spinal canal with no spinal stenosis. Electronically Signed   By: Odessa Fleming M.D.   On: 12/07/2023 08:43   DG Chest 2 View Result Date: 12/07/2023 CLINICAL DATA:  Suspected Sepsis abdominal pain EXAM: CHEST - 2 VIEW COMPARISON:  Chest x-ray 04/16/2022 FINDINGS: The heart and mediastinal contours are within normal limits. No focal consolidation. Chronic coarsened interstitial markings with no overt pulmonary edema. No pleural effusion. No pneumothorax. No acute osseous abnormality. Degenerative changes of the shoulders. IMPRESSION: No active cardiopulmonary disease. Electronically Signed   By: Tish Frederickson M.D.   On: 12/07/2023 00:32   CT L-SPINE NO CHARGE Result Date: 12/06/2023 CLINICAL DATA:  Acute nonlocalized abdominal pain EXAM: CT LUMBAR SPINE  WITHOUT CONTRAST TECHNIQUE: Multidetector CT imaging of the lumbar spine was performed without intravenous contrast administration. Multiplanar CT image reconstructions were also generated. RADIATION DOSE REDUCTION: This exam was performed according to the departmental dose-optimization program which includes automated exposure control, adjustment of the mA and/or kV according to  patient size and/or use of iterative reconstruction technique. COMPARISON:  CT abdomen and pelvis 05/04/2018 FINDINGS: Segmentation: 5 lumbar type vertebral bodies. Alignment: Normal alignment. Vertebrae: Compression of the L2 vertebra with about 40% loss of height. This is new since prior study. No retropulsion of fracture fragments. Compression of the superior endplate of L3 with about 20% loss of height. This is new since the prior study. No retropulsion of fracture fragments. Compression of the superior endplate of L5 is unchanged since prior study. No destructive or expansile bone lesions. Paraspinal and other soft tissues: See additional report of CT abdomen and pelvis same date. Disc levels: Intervertebral disc space heights are mostly preserved. Mild endplate osteophyte formation. IMPRESSION: Compression deformities of L2 and L3, new since prior study. Acute compression is not excluded. No retropulsion of fracture fragments. Electronically Signed   By: Burman Nieves M.D.   On: 12/06/2023 23:53   CT ABDOMEN PELVIS W CONTRAST Result Date: 12/06/2023 CLINICAL DATA:  Acute nonlocalized abdominal pain. EXAM: CT ABDOMEN AND PELVIS WITH CONTRAST TECHNIQUE: Multidetector CT imaging of the abdomen and pelvis was performed using the standard protocol following bolus administration of intravenous contrast. RADIATION DOSE REDUCTION: This exam was performed according to the departmental dose-optimization program which includes automated exposure control, adjustment of the mA and/or kV according to patient size and/or use of iterative  reconstruction technique. CONTRAST:  75mL OMNIPAQUE IOHEXOL 350 MG/ML SOLN COMPARISON:  05/04/2018 FINDINGS: Lower chest: Emphysematous changes in the lung bases. Bronchiectasis and bronchial wall thickening with evidence of mucous plugging likely chronic bronchitis. Trace bilateral pleural effusions with basilar atelectasis. Prominent coronary artery calcifications. Hepatobiliary: No focal liver abnormality is seen. No gallstones, gallbladder wall thickening, or biliary dilatation. Pancreas: Unremarkable. No pancreatic ductal dilatation or surrounding inflammatory changes. Spleen: Normal in size without focal abnormality. Adrenals/Urinary Tract: No adrenal gland nodules. Chronic scarring in the kidneys. Heterogeneous nephrograms bilaterally suggest evidence of acute pyelonephritis. No renal or ureteral obstruction. Visualized bladder is unremarkable. Portions of the bladder and pelvis are not visualized due to streak artifact from right hip arthroplasty. Stomach/Bowel: Stomach, small bowel, and colon are not abnormally distended. Stool fills the colon. There is a moderate-sized right inguinal hernia containing bowel but without obvious proximal obstruction. Visualization is limited due to the streak artifact. Vascular/Lymphatic: Calcification of the aorta. No aneurysm. Inferior vena caval filter. No significant lymphadenopathy. Reproductive: Prostate gland is mildly enlarged. Other: Small amount of free fluid along the pericolic gutters is nonspecific. No free air. 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 This is progressing since the previous study. Heterotopic ossification. Compression deformities of T12, L2, L3, and L5 demonstrate progression since the previous study. Acute compression is not excluded. Degenerative changes in the spine. Old rib fractures. IMPRESSION: 1. Emphysematous changes and chronic bronchitic changes in the lung bases.  Trace bilateral pleural effusions. 2. Heterogeneous nephrograms bilaterally suggesting acute pyelonephritis. No abscess or hydronephrosis. 3. Moderate-sized right inguinal hernia containing bowel without obvious proximal obstruction. Visualization is limited due to streak artifact. 4. Aortic atherosclerosis.  Inferior vena caval filter. 5. Small amount of free fluid in the abdomen of nonspecific etiology. Electronically Signed   By: Burman Nieves M.D.   On: 12/06/2023 23:50   I have personally spent 51 minutes involved in face-to-face and non-face-to-face activities for this patient on the day of the visit. Professional time spent includes the following activities: Preparing to see the patient (review of tests), Obtaining and/or reviewing separately obtained  history (admission/discharge record), Performing a medically appropriate examination and/or evaluation , Ordering medications/tests/procedures, referring and communicating with other health care professionals, Documenting clinical information in the EMR, Independently interpreting results (not separately reported), Communicating results to the patient/family/caregiver, Counseling and educating the patient/family/caregiver and Care coordination (not separately reported).   Plan d/w requesting provider as well as ID pharm D  Of note, portions of this note may have been created with voice recognition software. While this note has been edited for accuracy, occasional wrong-word or 'sound-a-like' substitutions may have occurred due to the inherent limitations of voice recognition software.   Electronically signed by:   Odette Fraction, MD Infectious Disease Physician [facility] Pager: [phone]

## 2023-12-09 NOTE — Progress Notes (Signed)
 Pharmacy Antibiotic Note  [name] [name] is a 63 y.o. male admitted on 12/06/2023 with back pain, possible osteomyelitis.  Admitted 3/8-3/21 at [facility] for MRSA bacteremia and possible endocarditis/osteomyelitis --received Vancomycin throughout admission.  Plan was for discharge 3/21 and administration of Dalvancin weekly, but dose not given.  Pharmacy has been consulted for Vancomycin  dosing.   Per documentation last levels at [facility] on 3/19 on 750mg  q24h with trough of 283 (trough: 7.2 and peak 16.2), adjusted regimen to 1250mg  q24h for eAUC ~480. Scr around 0.8 during this time.   Last dose of Vancomycin given here was 1000 mg  3/22 at 0630 and then patient lost IV access. Scr currently 1.04 with CrCl ~ 57 ml/min.   Patient now with  PICC line and will re-start Vancomycin at previously planned 1250 mg every 24 hours based on PK at [facility].   Plan: Vancomcyin 1250mg  q24h based on OSH levels.  Levels at steady state Follow  up imaging and disposition plans  Height: 6\' 1"  (185.4 cm) Weight: 56.3 kg (124 lb 1.9 oz) IBW/kg (Calculated) : 79.9  Temp (24hrs), Avg:97.7 F (36.5 C), Min:97.3 F (36.3 C), Max:98.2 F (36.8 C)  Recent Labs  Lab 12/06/23 2028 12/06/23 2103 12/06/23 2359 12/07/23 0455 12/07/23 0637 12/07/23 0641 12/07/23 1500 12/08/23 0522 12/09/23 0533  WBC 11.0*  --   --  7.8  --   --   --  7.9 7.5  CREATININE 1.05  --   --   --   --  0.90  --  0.78 1.04  LATICACIDVEN  --  2.5* 2.3*  --  1.8  --  1.6  --   --     Estimated Creatinine Clearance: 57.9 mL/min (by C-G formula based on SCr of 1.04 mg/dL).    Allergies  Allergen Reactions   Fentanyl Itching   Hydromorphone Hcl Itching    Pt  States itching all over Pt  States itching all over Pt  States itching all over    Morphine Itching   Pantoprazole Hives   Pantoprazole Sodium Hives   Haloperidol Other (See Comments)    Pt reports he has "seizures" had haldol on 09/25/21 with no  apparent ill effect.  Pt reports he has "seizures"   Nsaids Nausea And Vomiting, Other (See Comments) and Hives    Severe cramping Other reaction(s): Abdominal Pain Cramping per [place]   Morphine And Codeine Itching   Ibuprofen    Ketorolac Other (See Comments)   Pantoprazole Sodium Hives    Sharin Mons, PharmD, BCPS, BCIDP Infectious Diseases Clinical Pharmacist Phone: [phone] 12/09/2023 10:57 AM

## 2023-12-09 NOTE — Progress Notes (Signed)
 Pt has some blood stain on his bed sheet.He stated its coming from his sore on his back.Primary RN asked for assessment but the patient is refusing to be checked.

## 2023-12-09 NOTE — Progress Notes (Signed)
 Rounding Note    Patient Name: [name] [name] Date of Encounter: 12/09/2023  [facility] Cardiologist: None   Subjective   BP 106/75.  Creatinine 1.0, sodium 129.  Denies any chest pain or dyspnea  Inpatient Medications    Scheduled Meds:  apixaban  5 mg Oral BID   atorvastatin  80 mg Oral QHS   Chlorhexidine Gluconate Cloth  6 each Topical Daily   famotidine  20 mg Oral Daily   linezolid  600 mg Oral Q12H   spironolactone  25 mg Oral Daily   torsemide  20 mg Oral Daily   Continuous Infusions:  magnesium sulfate bolus IVPB Stopped (12/08/23 1449)   PRN Meds: acetaminophen, HYDROmorphone (DILAUDID) injection, LORazepam, melatonin, naLOXone (NARCAN)  injection, oxyCODONE, polyethylene glycol, prochlorperazine, sodium chloride flush   Vital Signs    Vitals:   12/09/23 0000 12/09/23 0417 12/09/23 0423 12/09/23 0800  BP: 135/76 123/81  106/75  Pulse: 74 74  91  Resp: 18 19  18   Temp:  97.6 F (36.4 C)  97.7 F (36.5 C)  TempSrc:  Oral  Oral  SpO2: 95% 96%  98%  Weight:   56.3 kg   Height:        Intake/Output Summary (Last 24 hours) at 12/09/2023 1034 Last data filed at 12/08/2023 2300 Gross per 24 hour  Intake 400 ml  Output 2250 ml  Net -1850 ml      12/09/2023    4:23 AM 12/08/2023    5:00 AM 12/07/2023    1:53 AM  Last 3 Weights  Weight (lbs) 124 lb 1.9 oz 123 lb 0.3 oz 139 lb  Weight (kg) 56.3 kg 55.8 kg 63.05 kg      Telemetry    NSR - Personally Reviewed  ECG    No new ECG - Personally Reviewed  Physical Exam   GEN: Cachectic, no acute distress  Neck: No JVD Cardiac: RRR, no murmurs, rubs, or gallops.  Respiratory: Clear to auscultation bilaterally. GI: Soft, nontender, non-distended  MS: No edema; No deformity. Neuro:  Nonfocal  Psych: Normal affect   Labs    High Sensitivity Troponin:   Recent Labs  Lab 12/06/23 2028 12/06/23 2203  TROPONINIHS 13 14     Chemistry Recent Labs  Lab 12/06/23 2028 12/07/23 0641  12/08/23 0522 12/09/23 0533  NA 130* 128* 126* 129*  K 4.2 4.2 3.9 3.8  CL 96* 98 97* 93*  CO2 19* 22 23 27   GLUCOSE 80 102* 91 91  BUN 14 12 10 14   CREATININE 1.05 0.90 0.78 1.04  CALCIUM 9.1 8.7* 8.5* 9.2  MG  --  1.6* 1.6* 1.8  PROT 8.4*  --   --  9.1*  ALBUMIN 2.3*  --   --  2.3*  AST 31  --   --  31  ALT 27  --   --  25  ALKPHOS 70  --   --  72  BILITOT 0.8  --   --  0.6  GFRNONAA >60 >60 >60 >60  ANIONGAP 15 8 6 9     Lipids  Recent Labs  Lab 12/09/23 0533  CHOL 114  TRIG 59  HDL 43  LDLCALC 59  CHOLHDL 2.7    Hematology Recent Labs  Lab 12/07/23 0455 12/08/23 0522 12/09/23 0533  WBC 7.8 7.9 7.5  RBC 3.44* 3.75* 4.24  HGB 9.8* 10.7* 12.2*  HCT 29.8* 32.2* 36.8*  MCV 86.6 85.9 86.8  MCH 28.5 28.5 28.8  MCHC 32.9 33.2 33.2  RDW 16.4* 16.0* 15.9*  PLT 403* 436* 502*   Thyroid No results for input(s): "TSH", "FREET4" in the last 168 hours.  BNP Recent Labs  Lab 12/08/23 0556 12/09/23 0533  BNP 965.0* 424.6*    DDimer No results for input(s): "DDIMER" in the last 168 hours.   Radiology    [place] EKG SITE RITE Result Date: 12/09/2023 If [facility] image not attached, placement could not be confirmed due to current cardiac rhythm.  DG Chest Port 1 View Result Date: 12/08/2023 CLINICAL DATA:  Shortness of breath. EXAM: PORTABLE CHEST 1 VIEW COMPARISON:  12/07/2023. FINDINGS: Patient is rotated. Trachea is midline. Heart size stable. Bibasilar streaky atelectasis. No airspace consolidation. There may be trace bilateral pleural effusions. IMPRESSION: 1. Trace bilateral pleural effusions. 2. Bibasilar streaky atelectasis. Electronically Signed   By: Leanna Battles M.D.   On: 12/08/2023 09:29   [place] EKG SITE RITE Result Date: 12/08/2023 If [facility] image not attached, placement could not be confirmed due to current cardiac rhythm.   Cardiac Studies     Patient Profile     63 y.o. male with a hx of chronic HFrEF (EF 40% in 12/2019, 20-25% in 04/2021,  15-20% in 05/2021 and 09/2021, 20-25% in 09/2021, < 15% in 02/2022, 11/2022 and 06/2023), CAD (cath in 02/2022 showing multivessel CAD with 50% LMCA stenosis, 80% mid RCA stenosis, 80% D2 stenosis, 40% proximal LAD stenosis, 60% mid/distal LAD stenosis, 70% OM1 stenosis and 70% mid LCx stenosis --> CT surgery consulted and ultimately decision was for medical management given his ongoing drug use), COPD, HTN, HLD, history of PE/DVT, homelessness and ongoing polysubstance abuse (UDS in 10/2023 positive for amphetamines and cocaine) who is being seen  for the evaluation of chronic HFrEF and need for TEE   Assessment & Plan    1. Chronic HFrEF - He has a longstanding cardiomyopathy dating back to at least 2021 and EF was previously less than 15% and read as being at 20 to 25% by most recent echocardiogram earlier this month. BNP was at 956 when checked earlier today and by review of records, this is chronically elevated. He has been restarted on Spironolactone 25 mg daily, Torsemide 20 mg daily and Toprol-XL 25 mg daily. Would stop Toprol-XL given cocaine use and could possibly switch to Coreg pending BP trend. Would be hesitant to start Entresto given variable BP and would not use an SGLT2 inhibitor given his infection risk. Not a candidate for advanced therapies given ongoing substance abuse. Overall, his prognosis is very poor given his longstanding cardiomyopathy, advanced CAD, medication noncompliance, homelessness and drug use.   2.  CAD - Prior cardiac catheterization 02/2022 showed multivessel CAD as outlined above, including 50% LMCA stenosis.  CT surgery was consulted at that time and recommended a cardiac MRI for viability but he declined and he was overall felt to be a poor surgical candidate given continued drug use and homelessness. - He has not been on ASA given the need for anticoagulation with Eliquis. Likely not on statin due to noncompliance. LDL 59   3. COPD - CT imaging has shown  emphysema and chronic bronchitis. Not on inhalers or nebulizers routinely.   4.  MRSA bacteremia - ID consult is pending and plans are for possible TEE.  He was evaluated on 3/23 and not felt to be candidate.  Plan to repeat TTE  5. History of PE/DVT - He has been continued on Eliquis 5mg  BID for  anticoagulation.    6. Hyponatremia - Appears to be chronic by review of labs from [facility]. Na+ was at 130 on admission, downtrending to 126 t3/23.  Improved to 129 today with restarting diuretics  For questions or updates, please contact [facility] Please consult [email or web address] for contact info under        Signed, Little Ishikawa, MD  12/09/2023, 10:34 AM

## 2023-12-09 NOTE — Progress Notes (Addendum)
 Initial Nutrition Assessment  DOCUMENTATION CODES:   Severe malnutrition in context of chronic illness  INTERVENTION:  Recommend liberalizing diet to encourage adequate calorie/protein intake Ensure Enlive po BID, each supplement provides 350 kcal and 20 grams of protein. Magic cup TID with meals, each supplement provides 290 kcal and 9 grams of protein MVI w/ minerals Add Florastor r/t ABX administration to maintain gut microbiome  NUTRITION DIAGNOSIS:  Severe Malnutrition related to chronic illness (COPD, Hep C, hx IV drug use) as evidenced by severe fat depletion, severe muscle depletion.  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 Presents to ED with c/o lower back pain. Of note, discharged from [facility] 3/22 and prefers not to return.  3/8-3/21 admitetd to [facility] in [place] for acute hypoxic respiratory failure 3/22 admitted 3/23 CXR trace bilateral pleural effusions 3/24 PICC line placed  No meal intake to review. He endorses poor/diminished intake x1 week PTA. States it has "picked back up" as of yesterday. Breakfast this morning consisted of eggs, hash browns, coffee, and sausage. Reports good tolerance. No recently documented N/V/C/D. Last BM reported 2 days ago with loose stools reported prior to admission. No difficulties with swallowing, but does report challenging to chew d/t dentition. Significant number of missing teeth, with the remaining observed in poor condition. Noted hx for esophageal stricture.States he does cook for himself on occasion. He is homeless and resides mostly in hotels.   24 Hour Recall B: biscuit w/  sausage, fried potatoes, coffee with cream and sugar L: hamburger/cheeseburger w/ fries and a soda D: protein, starch, veg w/ sweet tea Snacks: honeybuns w/ milk  Discussed importance of protein intake as it relates to malnutrition, wound healing, and progression with therapy. He verbalized understanding and is open to accepting nutrition supplement while admitted.   Reports no significant wt changes in last 6-12 months. UBW prior to significant weight loss was around 180lbs, however patient reports he has not weighed this for some time. Pt with no recent weights for review in his chart. Will assess trend during admission.   Admit Weight: 63kg - ? accuracy; showed 8kg wt loss over 24 hours w/ no documented diuresis Current Weight: 56.3kg  No edema noted on exam. UOP 2.8L yesterday. Dehydration resolved with fluid resuscitation.  Meds: famotidine, spironolactone, torsemide, IV ABX Drips: 4g Mg Sulfate x1  Labs:  Na+ 128>126>129 (L) Corr Ca 10.6 (H) CRP 5.9>5.5 (H) Lactic Acid 2.5>2.3>1.8>1.6 (wdl) CBGs 91-102 x48 hours  NUTRITION - FOCUSED PHYSICAL EXAM:  Flowsheet Row Most Recent Value  Orbital Region Severe depletion  Upper Arm Region Severe depletion  Thoracic and Lumbar Region Severe depletion  Buccal Region Severe depletion  Temple Region Severe depletion  Clavicle Bone Region Severe depletion  Clavicle and Acromion Bone Region Severe depletion  Scapular Bone Region Severe depletion  Dorsal Hand Moderate depletion  Patellar Region Severe depletion  Anterior Thigh Region Severe depletion  Posterior Calf Region Severe depletion  Edema (RD Assessment) None  Hair Reviewed  Eyes Reviewed  Mouth Reviewed  [missing teeth]  Nails Reviewed   Diet Order:   Diet Order  Diet Heart Room service appropriate? Yes; Fluid consistency: Thin  Diet effective now            EDUCATION NEEDS:   Education needs have been addressed  Skin:  Skin Assessment: Skin  Integrity Issues: Skin Integrity Issues:: DTI DTI: open/dehisced wound: mid sacrum  Last BM:  3/22  Height:  Ht Readings from Last 1 Encounters:  12/07/23 6\' 1"  (1.854 m)   Weight:  Wt Readings from Last 1 Encounters:  12/09/23 56.3 kg   Ideal Body Weight:  83.6 kg  BMI:  Body mass index is 16.38 kg/m.  Estimated Nutritional Needs:   Kcal:  2000-2200kcal  Protein:  100-115g  Fluid:  >1.9L/day  Myrtie Cruise MS, RD, LDN Registered Dietitian Clinical Nutrition RD Inpatient Contact Info in Amion

## 2023-12-10 ENCOUNTER — Inpatient Hospital Stay (HOSPITAL_COMMUNITY): Payer: MEDICAID

## 2023-12-10 DIAGNOSIS — I5042 Chronic combined systolic (congestive) and diastolic (congestive) heart failure: Secondary | ICD-10-CM

## 2023-12-10 DIAGNOSIS — R7881 Bacteremia: Secondary | ICD-10-CM | POA: Diagnosis not present

## 2023-12-10 DIAGNOSIS — B9562 Methicillin resistant Staphylococcus aureus infection as the cause of diseases classified elsewhere: Secondary | ICD-10-CM | POA: Diagnosis not present

## 2023-12-10 LAB — COMPREHENSIVE METABOLIC PANEL
ALT: 23 U/L (ref 0–44)
AST: 26 U/L (ref 15–41)
Albumin: 2.1 g/dL — ABNORMAL LOW (ref 3.5–5.0)
Alkaline Phosphatase: 58 U/L (ref 38–126)
Anion gap: 10 (ref 5–15)
BUN: 20 mg/dL (ref 8–23)
CO2: 26 mmol/L (ref 22–32)
Calcium: 8.8 mg/dL — ABNORMAL LOW (ref 8.9–10.3)
Chloride: 92 mmol/L — ABNORMAL LOW (ref 98–111)
Creatinine, Ser: 1.18 mg/dL (ref 0.61–1.24)
GFR, Estimated: >60 mL/min (ref >60)
Glucose, Bld: 100 mg/dL — ABNORMAL HIGH (ref 70–99)
Potassium: 3.8 mmol/L (ref 3.5–5.1)
Sodium: 128 mmol/L — ABNORMAL LOW (ref 135–145)
Total Bilirubin: 0.5 mg/dL (ref 0.0–1.2)
Total Protein: 7.5 g/dL (ref 6.5–8.1)

## 2023-12-10 LAB — CBC WITH DIFFERENTIAL/PLATELET
Abs Immature Granulocytes: 0.05 10*3/uL (ref 0.00–0.07)
Basophils Absolute: 0 10*3/uL (ref 0.0–0.1)
Basophils Relative: 1 %
Eosinophils Absolute: 0.2 10*3/uL (ref 0.0–0.5)
Eosinophils Relative: 3 %
HCT: 29.5 % — ABNORMAL LOW (ref 39.0–52.0)
Hemoglobin: 9.7 g/dL — ABNORMAL LOW (ref 13.0–17.0)
Immature Granulocytes: 1 %
Lymphocytes Relative: 12 %
Lymphs Abs: 0.8 10*3/uL (ref 0.7–4.0)
MCH: 28.7 pg (ref 26.0–34.0)
MCHC: 32.9 g/dL (ref 30.0–36.0)
MCV: 87.3 fL (ref 80.0–100.0)
Monocytes Absolute: 0.6 10*3/uL (ref 0.1–1.0)
Monocytes Relative: 9 %
Neutro Abs: 5 10*3/uL (ref 1.7–7.7)
Neutrophils Relative %: 74 %
Platelets: 392 10*3/uL (ref 150–400)
RBC: 3.38 MIL/uL — ABNORMAL LOW (ref 4.22–5.81)
RDW: 16.1 % — ABNORMAL HIGH (ref 11.5–15.5)
WBC: 6.8 10*3/uL (ref 4.0–10.5)
nRBC: 0 % (ref 0.0–0.2)

## 2023-12-10 LAB — BRAIN NATRIURETIC PEPTIDE: B Natriuretic Peptide: 154.1 pg/mL — ABNORMAL HIGH (ref 0.0–100.0)

## 2023-12-10 LAB — C-REACTIVE PROTEIN: CRP: 3.6 mg/dL — ABNORMAL HIGH (ref <1.0)

## 2023-12-10 LAB — MAGNESIUM: Magnesium: 1.5 mg/dL — ABNORMAL LOW (ref 1.7–2.4)

## 2023-12-10 LAB — HEPATITIS B SURFACE ANTIGEN: Hepatitis B Surface Ag: NONREACTIVE

## 2023-12-10 MED ORDER — CARVEDILOL 3.125 MG PO TABS
3.1250 mg | ORAL_TABLET | Freq: Two times a day (BID) | ORAL | Status: DC
  Administered 2023-12-10 – 2024-01-04 (×51): 3.125 mg via ORAL
  Filled 2023-12-10 (×53): qty 1

## 2023-12-10 MED ORDER — MAGNESIUM SULFATE 4 GM/100ML IV SOLN
4.0000 g | Freq: Once | INTRAVENOUS | Status: AC
  Administered 2023-12-10: 4 g via INTRAVENOUS
  Filled 2023-12-10: qty 100

## 2023-12-10 NOTE — Progress Notes (Signed)
 ID brief note   MRI C spine and RT hip findings noted. Discussed with Primary team regarding engaging Neurosurgery as well as Ortho given abnormal findings.    MR HIP RIGHT WO CONTRAST Result Date: 12/10/2023 CLINICAL DATA:  Septic arthritis. 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 Acute on chronic right hip pain. MRSA bacteremia. Acute on chronic right hip pain. EXAM: MR OF THE RIGHT HIP WITHOUT CONTRAST TECHNIQUE: Multiplanar, multisequence MR imaging was performed. No intravenous contrast was administered. The technologist reports the study was limited due to patient's evaluate tolerate the exam protocol. Best images possible were obtained within the limitations of patient motion and metallic artifact. The patient refused to continue for postcontrast portion of the study. COMPARISON:  CT abdomen and pelvis 12/06/2023 FINDINGS: Despite efforts by the technologist and patient, mild to moderate motion artifact is present on today's exam and could not be eliminated. This reduces exam sensitivity and specificity. Bones/joints/cartilage: There is metallic susceptibility artifact from total right hip arthroplasty. 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 There is decreased T1 and increased T2 signal fluid inferior to the right femoral neck prosthesis, suggesting a complex joint effusion with mild-to-moderate joint capsule thickening. This fluid extends into the right obturator externus muscle, measuring up to 4.6 x 2.3 x 2.6 cm (transverse by AP by craniocaudal; axial series 6, image 41 and coronal series 10, image 9). There is also a thin connection of this fluid extending to mild fluid within the right iliopsoas bursa (axial series 6 images 13 through 33). Overall, this  fluid appears to represent complex joint fluid extending into the right iliopsoas bursa. Within the limitations of metallic artifact, there also may be joint fluid extending inferior to the right femoral prosthetic head-neck junction (axial series 6, image 21 and coronal series 9, image 20). There also appears to be posterior extension of joint fluid (axial series 6, image 24) into a fluid collection wrapping around the posterior aspect of the right greater trochanter (axial series 6, image 23 and coronal series 10, image 28), measuring up to approximately 5.0 x 1.9 x 3.2 cm (transverse by AP by craniocaudal). There are mild septations within this fluid, and there is additional adjacent septated fluid extending more superiorly, into the right gluteus medius muscle (axial series 6, image 9 and coronal series 10, image 23) measuring up tor 4.7 x 2.3 x 2.0 cm (transverse by AP by craniocaudal. This is suspicious for infected joint fluid and developing abscesses. Muscles and tendons Muscles and tendons: Complex joint fluid extending into the right obturator externus muscle as above. Moderate right gluteus minimus and medius muscle edema. Focal edema within the anterior aspect of the left gluteus medius muscle (axial series 6, image 1). Other findings Miscellaneous: There is metallic susceptibility artifact in the region of the posterior right hip/buttock (axial series 6, image 11 and coronal series 9, image 6). Recommend clinical correlation for possible surgical skin staples in this region. IMPRESSION: 1. Status post total right hip arthroplasty. 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 2. There is complex joint fluid inferior to the right femoral neck prosthesis with mild-to-moderate joint  capsule thickening.  This fluid extends into the right obturator externus muscle and into the right iliopsoas bursa. There also appears to be posterior extension of joint fluid into a fluid collection wrapping around the posterior aspect of the right greater trochanter and extending superiorly into the right gluteus medius muscle. This is suspicious for right humerus however septic arthritis with the infected joint fluid extending peripherally. 3. Moderate right gluteus minimus and medius muscle edema. Focal edema within the anterior aspect of the left gluteus medius muscle. 4. There is metallic susceptibility artifact in the region of the posterior right hip/buttock. Recommend clinical correlation for possible surgical skin staples in this region. Electronically Signed   By: Neita Garnet M.D.   On: 12/10/2023 14:35   MR CERVICAL SPINE WO CONTRAST Result Date: 12/10/2023 CLINICAL DATA:  Neck pain, infection suspected, no prior imaging EXAM: MRI CERVICAL SPINE WITHOUT CONTRAST TECHNIQUE: Multiplanar, multisequence MR imaging of the cervical spine was performed. No intravenous contrast was administered. COMPARISON:  None Available. FINDINGS: Severely motion limited study. Postcontrast imaging was performed due to patient intolerance. Within this limitation: Alignment: No substantial sagittal subluxation. Vertebrae: Limited evaluation due to motion. Significant edema surrounding the left C2-C3 facet joint and extending to involve the posterior left C2 and C3 vertebral bodies. Cord: Poorly evaluated due to motion. No obvious cord signal abnormality. Posterior Fossa, vertebral arteries, paraspinal tissues: Paraspinal edema surrounding the left C2-C3 facet joint. Disc levels: No significant canal stenosis. Nondiagnostic evaluation of the foramina due to severe motion on axial sequences. IMPRESSION: Severely motion limited noncontrast study. Significant marrow edema surrounding the left C2-C3 facet joint and extending to involve the  posterior left C2 and C3 vertebral bodies with surrounding paraspinal edema, concerning for septic arthritis and possibly discitis/osteomyelitis given the reported clinical concern for infection. A repeat MRI of the cervical spine with contrast is recommended when the patient is able (possibly with sedation) to better evaluate. These results will be called to the ordering clinician or representative by the Radiologist Assistant, and communication documented in the PACS or [employer]. Electronically Signed   By: Feliberto Harts M.D.   On: 12/10/2023 02:10   MR BRAIN WO CONTRAST Result Date: 12/10/2023 CLINICAL DATA:  Brain abscess EXAM: MRI HEAD WITHOUT CONTRAST TECHNIQUE: Multiplanar, multiecho pulse sequences of the brain and surrounding structures were obtained without intravenous contrast. COMPARISON:  None Available. FINDINGS: Brain: No acute infarction, hemorrhage, hydrocephalus, extra-axial collection or mass lesion. Vascular: Major arterial flow voids are maintained at the skull base. Skull and upper cervical spine: Normal marrow signal. Sinuses/Orbits: Clear sinuses.  No acute orbital findings. Other: No mastoid effusions. IMPRESSION: No evidence of acute intracranial abnormality. Electronically Signed   By: Feliberto Harts M.D.   On: 12/10/2023 02:03

## 2023-12-10 NOTE — Progress Notes (Signed)
 Rounding Note    Patient Name: [name] [name] Date of Encounter: 12/10/2023  [facility] Cardiologist: None   Subjective   BP 108/73.  Creatinine 1.18, sodium 128.  Denies any chest pain or dyspnea  Inpatient Medications    Scheduled Meds:  apixaban  5 mg Oral BID   atorvastatin  80 mg Oral QHS   Chlorhexidine Gluconate Cloth  6 each Topical Daily   famotidine  20 mg Oral Daily   feeding supplement  237 mL Oral BID BM   multivitamin with minerals  1 tablet Oral Daily   saccharomyces boulardii  250 mg Oral BID   spironolactone  25 mg Oral Daily   torsemide  20 mg Oral Daily   Continuous Infusions:  vancomycin Stopped (12/09/23 1334)   PRN Meds: acetaminophen, HYDROmorphone (DILAUDID) injection, melatonin, naLOXone (NARCAN)  injection, oxyCODONE, polyethylene glycol, prochlorperazine, sodium chloride flush   Vital Signs    Vitals:   12/10/23 0400 12/10/23 0701 12/10/23 0752 12/10/23 1134  BP: 112/72  106/77 108/73  Pulse: 95  96 86  Resp: 14  18 20   Temp:   97.8 F (36.6 C) 98.1 F (36.7 C)  TempSrc: Oral  Oral Oral  SpO2: 95%  93% 96%  Weight:  58.4 kg    Height:        Intake/Output Summary (Last 24 hours) at 12/10/2023 1303 Last data filed at 12/10/2023 1030 Gross per 24 hour  Intake 921.67 ml  Output 650 ml  Net 271.67 ml      12/10/2023    7:01 AM 12/09/2023    7:53 PM 12/09/2023    4:23 AM  Last 3 Weights  Weight (lbs) 128 lb 12 oz 128 lb 12 oz 124 lb 1.9 oz  Weight (kg) 58.4 kg 58.4 kg 56.3 kg      Telemetry    NSR - Personally Reviewed  ECG    No new ECG - Personally Reviewed  Physical Exam   GEN: Cachectic, no acute distress  Neck: No JVD Cardiac: RRR, no murmurs, rubs, or gallops.  Respiratory: Clear to auscultation bilaterally. GI: Soft, nontender, non-distended  MS: No edema; No deformity. Neuro:  Nonfocal  Psych: Normal affect   Labs    High Sensitivity Troponin:   Recent Labs  Lab 12/06/23 2028 12/06/23 2203   TROPONINIHS 13 14     Chemistry Recent Labs  Lab 12/06/23 2028 12/07/23 0641 12/08/23 0522 12/09/23 0533 12/10/23 0449  NA 130*   < > 126* 129* 128*  K 4.2   < > 3.9 3.8 3.8  CL 96*   < > 97* 93* 92*  CO2 19*   < > 23 27 26   GLUCOSE 80   < > 91 91 100*  BUN 14   < > 10 14 20   CREATININE 1.05   < > 0.78 1.04 1.18  CALCIUM 9.1   < > 8.5* 9.2 8.8*  MG  --    < > 1.6* 1.8 1.5*  PROT 8.4*  --   --  9.1* 7.5  ALBUMIN 2.3*  --   --  2.3* 2.1*  AST 31  --   --  31 26  ALT 27  --   --  25 23  ALKPHOS 70  --   --  72 58  BILITOT 0.8  --   --  0.6 0.5  GFRNONAA >60   < > >60 >60 >60  ANIONGAP 15   < > 6 9 10    < > =  values in this interval not displayed.    Lipids  Recent Labs  Lab 12/09/23 0533  CHOL 114  TRIG 59  HDL 43  LDLCALC 59  CHOLHDL 2.7    Hematology Recent Labs  Lab 12/08/23 0522 12/09/23 0533 12/10/23 0449  WBC 7.9 7.5 6.8  RBC 3.75* 4.24 3.38*  HGB 10.7* 12.2* 9.7*  HCT 32.2* 36.8* 29.5*  MCV 85.9 86.8 87.3  MCH 28.5 28.8 28.7  MCHC 33.2 33.2 32.9  RDW 16.0* 15.9* 16.1*  PLT 436* 502* 392   Thyroid No results for input(s): "TSH", "FREET4" in the last 168 hours.  BNP Recent Labs  Lab 12/08/23 0556 12/09/23 0533 12/10/23 0449  BNP 965.0* 424.6* 154.1*    DDimer No results for input(s): "DDIMER" in the last 168 hours.   Radiology    MR CERVICAL SPINE WO CONTRAST Result Date: 12/10/2023 CLINICAL DATA:  Neck pain, infection suspected, no prior imaging EXAM: MRI CERVICAL SPINE WITHOUT CONTRAST TECHNIQUE: Multiplanar, multisequence MR imaging of the cervical spine was performed. No intravenous contrast was administered. COMPARISON:  None Available. FINDINGS: Severely motion limited study. Postcontrast imaging was performed due to patient intolerance. Within this limitation: Alignment: No substantial sagittal subluxation. Vertebrae: Limited evaluation due to motion. Significant edema surrounding the left C2-C3 facet joint and extending to involve the  posterior left C2 and C3 vertebral bodies. Cord: Poorly evaluated due to motion. No obvious cord signal abnormality. Posterior Fossa, vertebral arteries, paraspinal tissues: Paraspinal edema surrounding the left C2-C3 facet joint. Disc levels: No significant canal stenosis. Nondiagnostic evaluation of the foramina due to severe motion on axial sequences. IMPRESSION: Severely motion limited noncontrast study. Significant marrow edema surrounding the left C2-C3 facet joint and extending to involve the posterior left C2 and C3 vertebral bodies with surrounding paraspinal edema, concerning for septic arthritis and possibly discitis/osteomyelitis given the reported clinical concern for infection. A repeat MRI of the cervical spine with contrast is recommended when the patient is able (possibly with sedation) to better evaluate. These results will be called to the ordering clinician or representative by the Radiologist Assistant, and communication documented in the PACS or [employer]. Electronically Signed   By: Feliberto Harts M.D.   On: 12/10/2023 02:10   MR BRAIN WO CONTRAST Result Date: 12/10/2023 CLINICAL DATA:  Brain abscess EXAM: MRI HEAD WITHOUT CONTRAST TECHNIQUE: Multiplanar, multiecho pulse sequences of the brain and surrounding structures were obtained without intravenous contrast. COMPARISON:  None Available. FINDINGS: Brain: No acute infarction, hemorrhage, hydrocephalus, extra-axial collection or mass lesion. Vascular: Major arterial flow voids are maintained at the skull base. Skull and upper cervical spine: Normal marrow signal. Sinuses/Orbits: Clear sinuses.  No acute orbital findings. Other: No mastoid effusions. IMPRESSION: No evidence of acute intracranial abnormality. Electronically Signed   By: Feliberto Harts M.D.   On: 12/10/2023 02:03   ECHOCARDIOGRAM COMPLETE Result Date: 12/09/2023    ECHOCARDIOGRAM REPORT   Patient Name:   [name] [name] Date of Exam: 12/09/2023 Medical Rec #:   [id number]    Height:       73.0 in Accession #:    [id number]   Weight:       124.1 lb Date of Birth:  1961-08-11    BSA:          1.757 m Patient Age:    63 years     BP:           123/81 mmHg Patient Gender: M  HR:           80 bpm. Exam Location:  Inpatient Procedure: 2D Echo, Cardiac Doppler, Color Doppler and Intracardiac            Opacification Agent (Both Spectral and Color Flow Doppler were            utilized during procedure). Indications:    Bacteremia  History:        Patient has no prior history of Echocardiogram examinations.                 CHF, COPD; Risk Factors:IVDA.  Sonographer:    Amy Chionchio Referring Phys: [id number] Ellsworth Lennox IMPRESSIONS  1. Left ventricular ejection fraction, by estimation, is 20 to 25%. The left ventricle has severely decreased function. The left ventricle demonstrates global hypokinesis. The left ventricular internal cavity size was mildly dilated. Left ventricular diastolic parameters are consistent with Grade I diastolic dysfunction (impaired relaxation). No LV thrombus noted.  2. Right ventricular systolic function is mildly reduced. The right ventricular size is normal. Tricuspid regurgitation signal is inadequate for assessing PA pressure.  3. The mitral valve is normal in structure. No evidence of mitral valve regurgitation. No evidence of mitral stenosis. Moderate mitral annular calcification.  4. The aortic valve was not well visualized. Aortic valve regurgitation is not visualized. No aortic stenosis is present.  5. IVC not visualized. FINDINGS  Left Ventricle: Left ventricular ejection fraction, by estimation, is 20 to 25%. The left ventricle has severely decreased function. The left ventricle demonstrates global hypokinesis. Definity contrast agent was given IV to delineate the left ventricular endocardial borders. The left ventricular internal cavity size was mildly dilated. There is no left ventricular hypertrophy. Left ventricular diastolic  parameters are consistent with Grade I diastolic dysfunction (impaired relaxation). Right Ventricle: The right ventricular size is normal. No increase in right ventricular wall thickness. Right ventricular systolic function is mildly reduced. Tricuspid regurgitation signal is inadequate for assessing PA pressure. Left Atrium: Left atrial size was normal in size. Right Atrium: Right atrial size was normal in size. Pericardium: There is no evidence of pericardial effusion. Mitral Valve: The mitral valve is normal in structure. Moderate mitral annular calcification. No evidence of mitral valve regurgitation. No evidence of mitral valve stenosis. MV peak gradient, 2.4 mmHg. The mean mitral valve gradient is 1.0 mmHg. Tricuspid Valve: The tricuspid valve is normal in structure. Tricuspid valve regurgitation is not demonstrated. Aortic Valve: The aortic valve was not well visualized. Aortic valve regurgitation is not visualized. No aortic stenosis is present. Aortic valve mean gradient measures 3.0 mmHg. Aortic valve peak gradient measures 4.7 mmHg. Aortic valve area, by VTI measures 2.31 cm. Pulmonic Valve: The pulmonic valve was normal in structure. Pulmonic valve regurgitation is not visualized. Aorta: The aortic root is normal in size and structure. Venous: IVC not visualized. The inferior vena cava was not well visualized. IAS/Shunts: No atrial level shunt detected by color flow Doppler.  LEFT VENTRICLE PLAX 2D LVOT diam:     2.20 cm   Diastology LV SV:         33        LV e' medial:    5.00 cm/s LV SV Index:   19        LV E/e' medial:  9.1 LVOT Area:     3.80 cm  LV e' lateral:   5.87 cm/s  LV E/e' lateral: 7.7  RIGHT VENTRICLE TAPSE (M-mode): 1.7 cm AORTIC VALVE AV Area (Vmax):    2.37 cm AV Area (Vmean):   2.15 cm AV Area (VTI):     2.31 cm AV Vmax:           108.00 cm/s AV Vmean:          78.600 cm/s AV VTI:            0.141 m AV Peak Grad:      4.7 mmHg AV Mean Grad:      3.0 mmHg  LVOT Vmax:         67.20 cm/s LVOT Vmean:        44.500 cm/s LVOT VTI:          0.086 m LVOT/AV VTI ratio: 0.61 MITRAL VALVE MV Area (PHT): 2.96 cm    SHUNTS MV Area VTI:   2.19 cm    Systemic VTI:  0.09 m MV Peak grad:  2.4 mmHg    Systemic Diam: 2.20 cm MV Mean grad:  1.0 mmHg MV Vmax:       0.78 m/s MV Vmean:      46.7 cm/s MV Decel Time: 256 msec MV E velocity: 45.40 cm/s MV A velocity: 67.70 cm/s MV E/A ratio:  0.67 Dalton McleanMD Electronically signed by Wilfred Lacy Signature Date/Time: 12/09/2023/1:22:54 PM    Final    [place] EKG SITE RITE Result Date: 12/09/2023 If Site Rite image not attached, placement could not be confirmed due to current cardiac rhythm.   Cardiac Studies   TTE 12/09/23:  1. Left ventricular ejection fraction, by estimation, is 20 to 25%. The  left ventricle has severely decreased function. The left ventricle  demonstrates global hypokinesis. The left ventricular internal cavity size  was mildly dilated. Left ventricular  diastolic parameters are consistent with Grade I diastolic dysfunction  (impaired relaxation). No LV thrombus noted.   2. Right ventricular systolic function is mildly reduced. The right  ventricular size is normal. Tricuspid regurgitation signal is inadequate  for assessing PA pressure.   3. The mitral valve is normal in structure. No evidence of mitral valve  regurgitation. No evidence of mitral stenosis. Moderate mitral annular  calcification.   4. The aortic valve was not well visualized. Aortic valve regurgitation  is not visualized. No aortic stenosis is present.   5. IVC not visualized.   Patient Profile     62 y.o. male with a hx of chronic HFrEF (EF 40% in 12/2019, 20-25% in 04/2021, 15-20% in 05/2021 and 09/2021, 20-25% in 09/2021, < 15% in 02/2022, 11/2022 and 06/2023), CAD (cath in 02/2022 showing multivessel CAD with 50% LMCA stenosis, 80% mid RCA stenosis, 80% D2 stenosis, 40% proximal LAD stenosis, 60% mid/distal LAD stenosis,  70% OM1 stenosis and 70% mid LCx stenosis --> CT surgery consulted and ultimately decision was for medical management given his ongoing drug use), COPD, HTN, HLD, history of PE/DVT, homelessness and ongoing polysubstance abuse (UDS in 10/2023 positive for amphetamines and cocaine) who is being seen  for the evaluation of chronic HFrEF and need for TEE   Assessment & Plan    1. Chronic HFrEF - He has a longstanding cardiomyopathy dating back to at least 2021 and EF was previously less than 15% and read as being at 20 to 25% by most recent echocardiogram earlier this month.  Echo this admission with EF 20 to 25%.  He has been restarted on Spironolactone 25 mg daily, Torsemide  20 mg daily and Toprol-XL 25 mg daily.  Stop Toprol-XL and switch to carvedilol given cocaine use. Would be hesitant to start Entresto given variable BP and would not use an SGLT2 inhibitor given his infection risk. Not a candidate for advanced therapies given ongoing substance abuse. Overall, his prognosis is very poor given his longstanding cardiomyopathy, advanced CAD, medication noncompliance, homelessness and drug use.   2.  CAD - Prior cardiac catheterization 02/2022 showed multivessel CAD as outlined above, including 50% LMCA stenosis.  CT surgery was consulted at that time and recommended a cardiac MRI for viability but he declined and he was overall felt to be a poor surgical candidate given continued drug use and homelessness. - He has not been on ASA given the need for anticoagulation with Eliquis. Likely not on statin due to noncompliance. LDL 59   3. COPD - CT imaging has shown emphysema and chronic bronchitis. Not on inhalers or nebulizers routinely.   4.  MRSA bacteremia - ID consult is pending and recommended TEE.  He was evaluated on 3/23 and not felt to be candidate.  Repeat TTE this admission, no clear vegetation seen.  Antibiotics per ID  5. History of PE/DVT - He has been continued on Eliquis 5mg  BID for  anticoagulation.    6. Hyponatremia - Appears to be chronic by review of labs from [facility]. Na+ was at 130 on admission, downtrending to 126 3/23.  Currently 128, will monitor  For questions or updates, please contact [facility] Please consult [email or web address] for contact info under        Signed, Little Ishikawa, MD  12/10/2023, 1:03 PM

## 2023-12-10 NOTE — Plan of Care (Signed)

## 2023-12-10 NOTE — Progress Notes (Signed)
 PROGRESS NOTE                                                                                                                                                                                                             Patient Demographics:    [name] [name], is a 63 y.o. male, DOB - [birth year], ZOX:[id number]  Outpatient Primary MD for the patient is Patient, No Pcp Per    LOS - 3  Admit date - 12/06/2023    Chief Complaint  Patient presents with   Abdominal Pain       Brief Narrative (HPI from H&P)    63 y.o. 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   He did not want to go back to [facility], he was also due for a TEE and was admitted to the hospital here for further care.   Subjective:   Patient in bed, generalized bodyaches, back pain, no chest pain or shortness of breath.   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    For now continue IV Vancomycin, defer course and duration of antibiotics to ID.  He has remote history of IV drug use and methamphetamine use, currently denies all use.  UDS negative here.  After many refusals patient underwent MRI of brain and C-spine,  C-spine MRI shows changes suggestive of possible C2-C3 osteomyelitis, he is a very poor operative candidate but will consult neurosurgery for evaluation.  He was also supposed to get a right hip MRI which she refused 3 times, finally going for MRI on 12/10/2023.  Of note patient was to get Dalbavancin prior to [facility] but appears that he  never received it.    Lower back pain, compression fractures in lumbar spine vertebrae, T12, L2-L3 L5 - repeat MRI L-spine this admission stable, supportive care.  No signs of acute infection   Lactic acidosis - due to dehydration.  Has been hydrated.   Chronic HFrEF - LVEF 20 to 25%, Euvolemic on exam, home diuretics resumed cardiology to evaluate.   Possible acute pyelonephritis seen on CT scan, POA  - Continue IV antibiotics, As needed analgesics, follow urine culture.   History of PE/DVT on Eliquis  Resume home Eliquis also has IVC filter.   History of polysubstance abuse  Denies recent use of IV drugs   Tobacco use disorder  - Current smoker, Nicotine patch   Generalized weakness  PT OT assessment, Fall precautions   Severe protein calorie malnutrition - Dietitian consult, protein supplementation  Incidental finding of right inguinal hernia.  No acute issues.  Monitor  No IV access.  PICC line placed 12/09/2023 after much counseling       Condition - Extremely Guarded  Family Communication  :  None present  Code Status :   Full  Consults  :  ID, Cards  PUD Prophylaxis :  Pepcid   Procedures  :     PICC line placed 12/09/2023   TTE - 1. Left ventricular ejection fraction, by estimation, is 20 to 25%. The left ventricle has severely decreased function. The left ventricle demonstrates global hypokinesis. The left ventricular internal cavity size was mildly dilated. Left ventricular diastolic parameters are consistent with Grade I diastolic dysfunction (impaired relaxation). No LV thrombus noted.  2. Right  ventricular systolic function is mildly reduced. The right ventricular size is normal. Tricuspid regurgitation signal is inadequate for assessing PA pressure.  3. The mitral valve is normal in structure. No evidence of mitral valve regurgitation. No evidence of mitral stenosis. Moderate mitral annular calcification.  4. The aortic valve was not well visualized. Aortic valve regurgitation is not visualized. No aortic stenosis is present.  5. IVC not visualized.  MRI brain.  Nonacute.    MRI C-spine.   Severely motion limited noncontrast study. Significant marrow edema surrounding the left C2-C3 facet joint and extending to involve the posterior left C2 and C3 vertebral bodies with surrounding paraspinal edema, concerning for septic arthritis and possibly discitis/osteomyelitis given the reported clinical concern for infection. A repeat MRI of the cervical spine with contrast is recommended when the patient is able (possibly with sedation) to better evaluate.  MRI L-spine.  1. Chronic compression fractures of T12, L2, L3, and L5 with no complicating features. 2. Subtle and nonspecific bilateral erector spinae muscle edema. A similar appearance can be seen in chronically debilitated patients. 3. 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 4. Capacious lumbar spinal canal with no spinal stenosis  CT scan abdomen pelvis.1. Emphysematous changes and chronic bronchitic changes in the lung bases. Trace bilateral pleural effusions. 2. Heterogeneous nephrograms bilaterally suggesting acute pyelonephritis. No abscess or hydronephrosis. 3. Moderate-sized right inguinal hernia containing bowel without obvious proximal obstruction. Visualization is limited due to streak artifact. 4. Aortic atherosclerosis.  Inferior vena caval filter. 5. Small amount of free fluid in the abdomen of nonspecific etiology       Disposition Plan  :    Status is: Inpatient   DVT Prophylaxis  :     apixaban (ELIQUIS) tablet 5 mg  Lab Results  Component Value Date   PLT 392 12/10/2023    Diet :  Diet Order             Diet regular Fluid consistency: Thin  Diet effective now                    Inpatient Medications  Scheduled Meds:  apixaban  5 mg Oral BID   atorvastatin  80 mg Oral QHS   Chlorhexidine Gluconate Cloth  6 each Topical Daily   famotidine  20 mg Oral Daily   feeding supplement  237 mL Oral BID BM   multivitamin with minerals  1 tablet Oral Daily   saccharomyces boulardii  250 mg Oral BID   spironolactone  25 mg Oral Daily   torsemide  20 mg Oral Daily   Continuous Infusions:  magnesium sulfate bolus IVPB 4 g (12/10/23 0757)   vancomycin Stopped (12/09/23 1334)   PRN Meds:.acetaminophen, HYDROmorphone (DILAUDID) injection, melatonin, naLOXone (NARCAN)  injection, oxyCODONE, polyethylene glycol, prochlorperazine, sodium chloride flush     Objective:   Vitals:   12/09/23 1953 12/10/23 0400 12/10/23 0701 12/10/23 0752  BP: 95/74 112/72  106/77  Pulse: 83 95  96  Resp:  14  18  Temp: 98.6 F (37 C)   97.8 F (36.6 C)  TempSrc: Oral Oral  Oral  SpO2: 96% 95%  93%  Weight: 58.4 kg  58.4 kg   Height:        Wt Readings from Last 3 Encounters:  12/10/23 58.4 kg  10/23/18 72.6 kg  09/09/18 70.3 kg     Intake/Output Summary (Last 24 hours) at 12/10/2023 0938 Last data filed at 12/09/2023 1844 Gross per 24 hour  Intake 821.67 ml  Output 950 ml  Net -128.33 ml     Physical Exam  Awake Alert, No new F.N deficits, Normal affect [place].AT,PERRAL Supple Neck, No JVD,   Symmetrical Chest wall movement, Good air movement bilaterally, CTAB RRR  +ve B.Sounds, Abd Soft, No tenderness,   No Cyanosis, Clubbing or edema      Data Review:    Recent Labs  Lab 12/06/23 2028 12/07/23 0455 12/08/23 0522 12/09/23 0533 12/10/23 0449  WBC 11.0* 7.8 7.9 7.5 6.8   HGB 11.8* 9.8* 10.7* 12.2* 9.7*  HCT 36.6* 29.8* 32.2* 36.8* 29.5*  PLT 432* 403* 436* 502* 392  MCV 89.9 86.6 85.9 86.8 87.3  MCH 29.0 28.5 28.5 28.8 28.7  MCHC 32.2 32.9 33.2 33.2 32.9  RDW 16.2* 16.4* 16.0* 15.9* 16.1*  LYMPHSABS 0.9  --  0.9 1.3 0.8  MONOABS 0.9  --  0.8 0.6 0.6  EOSABS 0.0  --  0.1 0.2 0.2  BASOSABS 0.0  --  0.0 0.1 0.0    Recent Labs  Lab 12/06/23 2028 12/06/23 2103 12/06/23 2203 12/06/23 2359 12/07/23 0637 12/07/23 0641 12/07/23 1500 12/08/23 0522 12/08/23 0556 12/09/23 0533 12/10/23 0449  NA 130*  --   --   --   --  128*  --  126*  --  129* 128*  K 4.2  --   --   --   --  4.2  --  3.9  --  3.8 3.8  CL 96*  --   --   --   --  98  --  97*  --  93* 92*  CO2 19*  --   --   --   --  22  --  23  --  27  26  ANIONGAP 15  --   --   --   --  8  --  6  --  9 10  GLUCOSE 80  --   --   --   --  102*  --  91  --  91 100*  BUN 14  --   --   --   --  12  --  10  --  14 20  CREATININE 1.05  --   --   --   --  0.90  --  0.78  --  1.04 1.18  AST 31  --   --   --   --   --   --   --   --  31 26  ALT 27  --   --   --   --   --   --   --   --  25 23  ALKPHOS 70  --   --   --   --   --   --   --   --  72 58  BILITOT 0.8  --   --   --   --   --   --   --   --  0.6 0.5  ALBUMIN 2.3*  --   --   --   --   --   --   --   --  2.3* 2.1*  CRP  --   --   --   --   --   --   --   --  5.9* 5.5* 3.6*  PROCALCITON  --   --   --   --   --   --   --  <0.10  --  <0.10  --   LATICACIDVEN  --  2.5*  --  2.3* 1.8  --  1.6  --   --   --   --   INR  --   --  1.6*  --   --   --   --   --   --   --   --   BNP  --   --   --   --   --   --   --   --  965.0* 424.6* 154.1*  MG  --   --   --   --   --  1.6*  --  1.6*  --  1.8 1.5*  PHOS  --   --   --   --   --  2.9  --   --   --   --   --   CALCIUM 9.1  --   --   --   --  8.7*  --  8.5*  --  9.2 8.8*      Recent Labs  Lab 12/06/23 2028 12/06/23 2103 12/06/23 2203 12/06/23 2359 12/07/23 0637 12/07/23 0641 12/07/23 1500 12/08/23 0522  12/08/23 0556 12/09/23 0533 12/10/23 0449  CRP  --   --   --   --   --   --   --   --  5.9* 5.5* 3.6*  PROCALCITON  --   --   --   --   --   --   --  <0.10  --  <0.10  --   LATICACIDVEN  --  2.5*  --  2.3* 1.8  --  1.6  --   --   --   --  INR  --   --  1.6*  --   --   --   --   --   --   --   --   BNP  --   --   --   --   --   --   --   --  965.0* 424.6* 154.1*  MG  --   --   --   --   --  1.6*  --  1.6*  --  1.8 1.5*  CALCIUM 9.1  --   --   --   --  8.7*  --  8.5*  --  9.2 8.8*    --------------------------------------------------------------------------------------------------------------- Lab Results  Component Value Date   CHOL 114 12/09/2023   HDL 43 12/09/2023   LDLCALC 59 12/09/2023   TRIG 59 12/09/2023   CHOLHDL 2.7 12/09/2023    No results found for: "HGBA1C" No results for input(s): "TSH", "T4TOTAL", "FREET4", "T3FREE", "THYROIDAB" in the last 72 hours. No results for input(s): "VITAMINB12", "FOLATE", "FERRITIN", "TIBC", "IRON", "RETICCTPCT" in the last 72 hours. ------------------------------------------------------------------------------------------------------------------ Cardiac Enzymes No results for input(s): "CKMB", "TROPONINI", "MYOGLOBIN" in the last 168 hours.  Invalid input(s): "CK"  Micro Results Recent Results (from the past 240 hours)  Culture, blood (Routine x 2)     Status: None (Preliminary result)   Collection Time: 12/06/23  8:28 PM   Specimen: BLOOD LEFT FOREARM  Result Value Ref Range Status   Specimen Description BLOOD LEFT FOREARM  Final   Special Requests   Final    BOTTLES DRAWN AEROBIC ONLY Blood Culture results may not be optimal due to an inadequate volume of blood received in culture bottles   Culture   Final    NO GROWTH 3 DAYS Performed at [facility], [address], [place], [place] [address]    Report Status PENDING  Incomplete  Culture, blood (Routine x 2)     Status: None (Preliminary result)   Collection Time:  12/06/23  8:33 PM   Specimen: BLOOD RIGHT FOREARM  Result Value Ref Range Status   Specimen Description BLOOD RIGHT FOREARM  Final   Special Requests   Final    BOTTLES DRAWN AEROBIC AND ANAEROBIC Blood Culture results may not be optimal due to an inadequate volume of blood received in culture bottles   Culture   Final    NO GROWTH 3 DAYS Performed at [facility], [address] [place], [place] [address]    Report Status PENDING  Incomplete    Radiology Report MR CERVICAL SPINE WO CONTRAST Result Date: 12/10/2023 CLINICAL DATA:  Neck pain, infection suspected, no prior imaging EXAM: MRI CERVICAL SPINE WITHOUT CONTRAST TECHNIQUE: Multiplanar, multisequence MR imaging of the cervical spine was performed. No intravenous contrast was administered. COMPARISON:  None Available. FINDINGS: Severely motion limited study. Postcontrast imaging was performed due to patient intolerance. Within this limitation: Alignment: No substantial sagittal subluxation. Vertebrae: Limited evaluation due to motion. Significant edema surrounding the left C2-C3 facet joint and extending to involve the posterior left C2 and C3 vertebral bodies. Cord: Poorly evaluated due to motion. No obvious cord signal abnormality. Posterior Fossa, vertebral arteries, paraspinal tissues: Paraspinal edema surrounding the left C2-C3 facet joint. Disc levels: No significant canal stenosis. Nondiagnostic evaluation of the foramina due to severe motion on axial sequences. IMPRESSION: Severely motion limited noncontrast study. Significant marrow edema surrounding the left C2-C3 facet joint and extending to involve the posterior left C2 and C3 vertebral bodies  with surrounding paraspinal edema, concerning for septic arthritis and possibly discitis/osteomyelitis given the reported clinical concern for infection. A repeat MRI of the cervical spine with contrast is recommended when the patient is able (possibly with sedation) to better evaluate.  These results will be called to the ordering clinician or representative by the Radiologist Assistant, and communication documented in the PACS or [employer]. Electronically Signed   By: Feliberto Harts M.D.   On: 12/10/2023 02:10   MR BRAIN WO CONTRAST Result Date: 12/10/2023 CLINICAL DATA:  Brain abscess EXAM: MRI HEAD WITHOUT CONTRAST TECHNIQUE: Multiplanar, multiecho pulse sequences of the brain and surrounding structures were obtained without intravenous contrast. COMPARISON:  None Available. FINDINGS: Brain: No acute infarction, hemorrhage, hydrocephalus, extra-axial collection or mass lesion. Vascular: Major arterial flow voids are maintained at the skull base. Skull and upper cervical spine: Normal marrow signal. Sinuses/Orbits: Clear sinuses.  No acute orbital findings. Other: No mastoid effusions. IMPRESSION: No evidence of acute intracranial abnormality. Electronically Signed   By: Feliberto Harts M.D.   On: 12/10/2023 02:03   ECHOCARDIOGRAM COMPLETE Result Date: 12/09/2023    ECHOCARDIOGRAM REPORT   Patient Name:   [name] [name] Date of Exam: 12/09/2023 Medical Rec #:  [id number]    Height:       73.0 in Accession #:    [id number]   Weight:       124.1 lb Date of Birth:  1960-11-03    BSA:          1.757 m Patient Age:    63 years     BP:           123/81 mmHg Patient Gender: M            HR:           80 bpm. Exam Location:  Inpatient Procedure: 2D Echo, Cardiac Doppler, Color Doppler and Intracardiac            Opacification Agent (Both Spectral and Color Flow Doppler were            utilized during procedure). Indications:    Bacteremia  History:        Patient has no prior history of Echocardiogram examinations.                 CHF, COPD; Risk Factors:IVDA.  Sonographer:    Amy Chionchio Referring Phys: [id number] [place]sworth Lennox IMPRESSIONS  1. Left ventricular ejection fraction, by estimation, is 20 to 25%. The left ventricle has severely decreased function. The left ventricle  demonstrates global hypokinesis. The left ventricular internal cavity size was mildly dilated. Left ventricular diastolic parameters are consistent with Grade I diastolic dysfunction (impaired relaxation). No LV thrombus noted.  2. Right ventricular systolic function is mildly reduced. The right ventricular size is normal. Tricuspid regurgitation signal is inadequate for assessing PA pressure.  3. The mitral valve is normal in structure. No evidence of mitral valve regurgitation. No evidence of mitral stenosis. Moderate mitral annular calcification.  4. The aortic valve was not well visualized. Aortic valve regurgitation is not visualized. No aortic stenosis is present.  5. IVC not visualized. FINDINGS  Left Ventricle: Left ventricular ejection fraction, by estimation, is 20 to 25%. The left ventricle has severely decreased function. The left ventricle demonstrates global hypokinesis. Definity contrast agent was given IV to delineate the left ventricular endocardial borders. The left ventricular internal cavity size was mildly dilated. There is no left ventricular hypertrophy. Left ventricular diastolic parameters are consistent  with Grade I diastolic dysfunction (impaired relaxation). Right Ventricle: The right ventricular size is normal. No increase in right ventricular wall thickness. Right ventricular systolic function is mildly reduced. Tricuspid regurgitation signal is inadequate for assessing PA pressure. Left [facility]: Left atrial size was normal in size. Right [facility]: Right atrial size was normal in size. Pericardium: There is no evidence of pericardial effusion. Mitral Valve: The mitral valve is normal in structure. Moderate mitral annular calcification. No evidence of mitral valve regurgitation. No evidence of mitral valve stenosis. MV peak gradient, 2.4 mmHg. The mean mitral valve gradient is 1.0 mmHg. Tricuspid Valve: The tricuspid valve is normal in structure. Tricuspid valve regurgitation is not  demonstrated. Aortic Valve: The aortic valve was not well visualized. Aortic valve regurgitation is not visualized. No aortic stenosis is present. Aortic valve mean gradient measures 3.0 mmHg. Aortic valve peak gradient measures 4.7 mmHg. Aortic valve area, by VTI measures 2.31 cm. Pulmonic Valve: The pulmonic valve was normal in structure. Pulmonic valve regurgitation is not visualized. Aorta: The aortic root is normal in size and structure. Venous: IVC not visualized. The inferior vena cava was not well visualized. IAS/Shunts: No atrial level shunt detected by color flow Doppler.  LEFT VENTRICLE PLAX 2D LVOT diam:     2.20 cm   Diastology LV SV:         33        LV e' medial:    5.00 cm/s LV SV Index:   19        LV E/e' medial:  9.1 LVOT Area:     3.80 cm  LV e' lateral:   5.87 cm/s                          LV E/e' lateral: 7.7  RIGHT VENTRICLE TAPSE (M-mode): 1.7 cm AORTIC VALVE AV Area (Vmax):    2.37 cm AV Area (Vmean):   2.15 cm AV Area (VTI):     2.31 cm AV Vmax:           108.00 cm/s AV Vmean:          78.600 cm/s AV VTI:            0.141 m AV Peak Grad:      4.7 mmHg AV Mean Grad:      3.0 mmHg LVOT Vmax:         67.20 cm/s LVOT Vmean:        44.500 cm/s LVOT VTI:          0.086 m LVOT/AV VTI ratio: 0.61 MITRAL VALVE MV Area (PHT): 2.96 cm    SHUNTS MV Area VTI:   2.19 cm    Systemic VTI:  0.09 m MV Peak grad:  2.4 mmHg    Systemic Diam: 2.20 cm MV Mean grad:  1.0 mmHg MV Vmax:       0.78 m/s MV Vmean:      46.7 cm/s MV Decel Time: 256 msec MV E velocity: 45.40 cm/s MV A velocity: 67.70 cm/s MV E/A ratio:  0.67 Dalton McleanMD Electronically signed by Wilfred Lacy Signature Date/Time: 12/09/2023/1:22:54 PM    Final    [place] EKG SITE RITE Result Date: 12/09/2023 If Site Rite image not attached, placement could not be confirmed due to current cardiac rhythm.    Signature  -   Susa Raring M.D on 12/10/2023 at 9:38 AM   -  To page go to [email or web address]

## 2023-12-11 ENCOUNTER — Inpatient Hospital Stay (HOSPITAL_COMMUNITY): Payer: MEDICAID

## 2023-12-11 DIAGNOSIS — I079 Rheumatic tricuspid valve disease, unspecified: Secondary | ICD-10-CM | POA: Diagnosis not present

## 2023-12-11 DIAGNOSIS — I2510 Atherosclerotic heart disease of native coronary artery without angina pectoris: Secondary | ICD-10-CM | POA: Diagnosis not present

## 2023-12-11 DIAGNOSIS — I5042 Chronic combined systolic (congestive) and diastolic (congestive) heart failure: Secondary | ICD-10-CM | POA: Diagnosis not present

## 2023-12-11 DIAGNOSIS — I429 Cardiomyopathy, unspecified: Secondary | ICD-10-CM

## 2023-12-11 DIAGNOSIS — B9562 Methicillin resistant Staphylococcus aureus infection as the cause of diseases classified elsewhere: Secondary | ICD-10-CM | POA: Diagnosis not present

## 2023-12-11 DIAGNOSIS — R7881 Bacteremia: Secondary | ICD-10-CM | POA: Diagnosis not present

## 2023-12-11 DIAGNOSIS — G9520 Unspecified cord compression: Secondary | ICD-10-CM | POA: Diagnosis not present

## 2023-12-11 LAB — CBC WITH DIFFERENTIAL/PLATELET
Abs Immature Granulocytes: 0.06 10*3/uL (ref 0.00–0.07)
Basophils Absolute: 0 10*3/uL (ref 0.0–0.1)
Basophils Relative: 0 %
Eosinophils Absolute: 0.1 10*3/uL (ref 0.0–0.5)
Eosinophils Relative: 1 %
HCT: 32.1 % — ABNORMAL LOW (ref 39.0–52.0)
Hemoglobin: 10.5 g/dL — ABNORMAL LOW (ref 13.0–17.0)
Immature Granulocytes: 1 %
Lymphocytes Relative: 9 %
Lymphs Abs: 0.9 10*3/uL (ref 0.7–4.0)
MCH: 28.4 pg (ref 26.0–34.0)
MCHC: 32.7 g/dL (ref 30.0–36.0)
MCV: 86.8 fL (ref 80.0–100.0)
Monocytes Absolute: 0.4 10*3/uL (ref 0.1–1.0)
Monocytes Relative: 5 %
Neutro Abs: 7.7 10*3/uL (ref 1.7–7.7)
Neutrophils Relative %: 84 %
Platelets: 451 10*3/uL — ABNORMAL HIGH (ref 150–400)
RBC: 3.7 MIL/uL — ABNORMAL LOW (ref 4.22–5.81)
RDW: 16.2 % — ABNORMAL HIGH (ref 11.5–15.5)
WBC: 9.1 10*3/uL (ref 4.0–10.5)
nRBC: 0 % (ref 0.0–0.2)

## 2023-12-11 LAB — COMPREHENSIVE METABOLIC PANEL
ALT: 19 U/L (ref 0–44)
AST: 19 U/L (ref 15–41)
Albumin: 2 g/dL — ABNORMAL LOW (ref 3.5–5.0)
Alkaline Phosphatase: 66 U/L (ref 38–126)
Anion gap: 10 (ref 5–15)
BUN: 30 mg/dL — ABNORMAL HIGH (ref 8–23)
CO2: 26 mmol/L (ref 22–32)
Calcium: 8.8 mg/dL — ABNORMAL LOW (ref 8.9–10.3)
Chloride: 92 mmol/L — ABNORMAL LOW (ref 98–111)
Creatinine, Ser: 1.27 mg/dL — ABNORMAL HIGH (ref 0.61–1.24)
GFR, Estimated: >60 mL/min (ref >60)
Glucose, Bld: 98 mg/dL (ref 70–99)
Potassium: 3.8 mmol/L (ref 3.5–5.1)
Sodium: 128 mmol/L — ABNORMAL LOW (ref 135–145)
Total Bilirubin: 0.8 mg/dL (ref 0.0–1.2)
Total Protein: 7.6 g/dL (ref 6.5–8.1)

## 2023-12-11 LAB — CULTURE, BLOOD (ROUTINE X 2)
Culture: NO GROWTH
Culture: NO GROWTH

## 2023-12-11 LAB — C-REACTIVE PROTEIN: CRP: 4.4 mg/dL — ABNORMAL HIGH (ref <1.0)

## 2023-12-11 LAB — HCV RNA (INTERNATIONAL UNITS)
HCV RNA (International Units): 11800000 [IU]/mL
HCV log10: 7.072 {Log_IU}/mL

## 2023-12-11 LAB — UREA NITROGEN, URINE: Urea Nitrogen, Ur: 42 mg/dL

## 2023-12-11 LAB — BRAIN NATRIURETIC PEPTIDE: B Natriuretic Peptide: 236.1 pg/mL — ABNORMAL HIGH (ref 0.0–100.0)

## 2023-12-11 LAB — MAGNESIUM: Magnesium: 2.4 mg/dL (ref 1.7–2.4)

## 2023-12-11 LAB — HCV RNA QUANT

## 2023-12-11 MED ORDER — TORSEMIDE 20 MG PO TABS: 20.0000 mg | ORAL_TABLET | Freq: Every day | ORAL | Status: DC

## 2023-12-11 MED ORDER — SPIRONOLACTONE 25 MG PO TABS
25.0000 mg | ORAL_TABLET | Freq: Every day | ORAL | Status: DC
  Administered 2023-12-12 – 2024-01-04 (×24): 25 mg via ORAL
  Filled 2023-12-11 (×25): qty 1

## 2023-12-11 MED ORDER — LACTATED RINGERS IV SOLN: INTRAVENOUS | Status: DC

## 2023-12-11 MED ORDER — VANCOMYCIN HCL IN DEXTROSE 1-5 GM/200ML-% IV SOLN
1000.0000 mg | INTRAVENOUS | Status: DC
  Administered 2023-12-11 – 2023-12-12 (×2): 1000 mg via INTRAVENOUS
  Filled 2023-12-11 (×2): qty 200

## 2023-12-11 NOTE — Progress Notes (Signed)
 Pharmacy Antibiotic Note  [name] [name] is a 63 y.o. male admitted on 12/06/2023 with back pain, possible osteomyelitis.  Admitted 3/8-3/21 at [facility] for MRSA bacteremia and possible endocarditis/osteomyelitis --received Vancomycin throughout admission.  Plan was for discharge 3/21 and administration of Dalvancin weekly, but dose not given.  Pharmacy has been consulted for Vancomycin  dosing.   Per documentation last levels at [facility] on 3/19 on 750mg  q24h with trough of 283 (trough: 7.2 and peak 16.2), adjusted regimen to 1250mg  q24h for eAUC ~480. Scr around 0.8 during this time.   Last dose of Vancomycin given here was 1000 mg  3/22 at 0630 and then patient lost IV access. Scr currently 1.04 with CrCl ~ 57 ml/min.   Patient now with  PICC line and will re-start Vancomycin at previously planned 1250 mg every 24 hours based on PK at [facility].   3/26 AM update: Scr 1.27 (increased from 1.04 on initial dose calc)  Plan: Decrease Vancomcyin 1000mg  q24h eAUC 562 scr 1.27 Vd 0.72 Levels at steady state Follow  up imaging and disposition plans  Height: 6\' 1"  (185.4 cm) Weight: 56.4 kg (124 lb 5.4 oz) IBW/kg (Calculated) : 79.9  Temp (24hrs), Avg:97.7 F (36.5 C), Min:97.2 F (36.2 C), Max:98.1 F (36.7 C)  Recent Labs  Lab 12/06/23 2103 12/06/23 2359 12/07/23 0455 12/07/23 0637 12/07/23 0641 12/07/23 1500 12/08/23 0522 12/09/23 0533 12/10/23 0449 12/11/23 0314  WBC  --   --  7.8  --   --   --  7.9 7.5 6.8 9.1  CREATININE  --   --   --   --  0.90  --  0.78 1.04 1.18 1.27*  LATICACIDVEN 2.5* 2.3*  --  1.8  --  1.6  --   --   --   --     Estimated Creatinine Clearance: 47.5 mL/min (A) (by C-G formula based on SCr of 1.27 mg/dL (H)).    Allergies  Allergen Reactions   Fentanyl Itching   Hydromorphone Hcl Itching    Pt  States itching all over Pt  States itching all over Pt  States itching all over    Morphine Itching   Pantoprazole Hives    Pantoprazole Sodium Hives   Haloperidol Other (See Comments)    Pt reports he has "seizures" had haldol on 09/25/21 with no apparent ill effect.  Pt reports he has "seizures"   Nsaids Nausea And Vomiting, Other (See Comments) and Hives    Severe cramping Other reaction(s): Abdominal Pain Cramping per [place]   Morphine And Codeine Itching   Ibuprofen    Ketorolac Other (See Comments)   Pantoprazole Sodium Hives    Gurnoor Ursua BS, PharmD, BCPS Clinical Pharmacist 12/11/2023 8:07 AM  Contact: [phone] after 3 PM  "Be curious, not judgmental..." -Debbora Dus

## 2023-12-11 NOTE — Plan of Care (Signed)

## 2023-12-11 NOTE — Progress Notes (Signed)
 Rounding Note    Patient Name: [name] [name] Date of Encounter: 12/11/2023  [facility] Cardiologist: None   Subjective   BP 119/73.  Creatinine rising, 1.27  Denies any chest pain or dyspnea  Inpatient Medications    Scheduled Meds:  apixaban  5 mg Oral BID   atorvastatin  80 mg Oral QHS   carvedilol  3.125 mg Oral BID WC   Chlorhexidine Gluconate Cloth  6 each Topical Daily   famotidine  20 mg Oral Daily   feeding supplement  237 mL Oral BID BM   multivitamin with minerals  1 tablet Oral Daily   saccharomyces boulardii  250 mg Oral BID   [START ON 12/12/2023] spironolactone  25 mg Oral Daily   [START ON 12/12/2023] torsemide  20 mg Oral Daily   Continuous Infusions:  lactated ringers 50 mL/hr at 12/11/23 0631   vancomycin     PRN Meds: acetaminophen, HYDROmorphone (DILAUDID) injection, melatonin, naLOXone (NARCAN)  injection, oxyCODONE, polyethylene glycol, prochlorperazine, sodium chloride flush   Vital Signs    Vitals:   12/10/23 1600 12/10/23 1916 12/10/23 2327 12/11/23 0400  BP: 106/75 (!) 89/73 113/77 119/79  Pulse: 88  88 90  Resp: 15 18 17 17   Temp:  97.7 F (36.5 C) 97.8 F (36.6 C) (!) 97.2 F (36.2 C)  TempSrc:  Oral Oral Oral  SpO2: 94%  92% 92%  Weight:   56.4 kg   Height:        Intake/Output Summary (Last 24 hours) at 12/11/2023 1021 Last data filed at 12/10/2023 1700 Gross per 24 hour  Intake 650 ml  Output 1300 ml  Net -650 ml      12/10/2023   11:27 PM 12/10/2023    7:01 AM 12/09/2023    7:53 PM  Last 3 Weights  Weight (lbs) 124 lb 5.4 oz 128 lb 12 oz 128 lb 12 oz  Weight (kg) 56.4 kg 58.4 kg 58.4 kg      Telemetry    NSR - Personally Reviewed  ECG    No new ECG - Personally Reviewed  Physical Exam   GEN: Cachectic, no acute distress  Neck: No JVD Cardiac: RRR, no murmurs, rubs, or gallops.  Respiratory: Clear to auscultation bilaterally. GI: Soft, nontender, non-distended  MS: No edema; No deformity. Neuro:   Nonfocal  Psych: Normal affect   Labs    High Sensitivity Troponin:   Recent Labs  Lab 12/06/23 2028 12/06/23 2203  TROPONINIHS 13 14     Chemistry Recent Labs  Lab 12/09/23 0533 12/10/23 0449 12/11/23 0314  NA 129* 128* 128*  K 3.8 3.8 3.8  CL 93* 92* 92*  CO2 27 26 26   GLUCOSE 91 100* 98  BUN 14 20 30*  CREATININE 1.04 1.18 1.27*  CALCIUM 9.2 8.8* 8.8*  MG 1.8 1.5* 2.4  PROT 9.1* 7.5 7.6  ALBUMIN 2.3* 2.1* 2.0*  AST 31 26 19   ALT 25 23 19   ALKPHOS 72 58 66  BILITOT 0.6 0.5 0.8  GFRNONAA >60 >60 >60  ANIONGAP 9 10 10     Lipids  Recent Labs  Lab 12/09/23 0533  CHOL 114  TRIG 59  HDL 43  LDLCALC 59  CHOLHDL 2.7    Hematology Recent Labs  Lab 12/09/23 0533 12/10/23 0449 12/11/23 0314  WBC 7.5 6.8 9.1  RBC 4.24 3.38* 3.70*  HGB 12.2* 9.7* 10.5*  HCT 36.8* 29.5* 32.1*  MCV 86.8 87.3 86.8  MCH 28.8 28.7 28.4  MCHC 33.2 32.9 32.7  RDW 15.9* 16.1* 16.2*  PLT 502* 392 451*   Thyroid No results for input(s): "TSH", "FREET4" in the last 168 hours.  BNP Recent Labs  Lab 12/09/23 0533 12/10/23 0449 12/11/23 0314  BNP 424.6* 154.1* 236.1*    DDimer No results for input(s): "DDIMER" in the last 168 hours.   Radiology    MR HIP RIGHT WO CONTRAST Result Date: 12/10/2023 CLINICAL DATA:  Septic arthritis. 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 Acute on chronic right hip pain. MRSA bacteremia. Acute on chronic right hip pain. EXAM: MR OF THE RIGHT HIP WITHOUT CONTRAST TECHNIQUE: Multiplanar, multisequence MR imaging was performed. No intravenous contrast was administered. The technologist reports the study was limited due to patient's evaluate tolerate the exam protocol. Best images possible were obtained within the limitations of patient motion and metallic artifact. The patient refused to continue for postcontrast portion of the study. COMPARISON:  CT abdomen and pelvis 12/06/2023 FINDINGS: Despite efforts by the technologist and patient, mild to  moderate motion artifact is present on today's exam and could not be eliminated. This reduces exam sensitivity and specificity. Bones/joints/cartilage: There is metallic susceptibility artifact from total right hip arthroplasty. 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 There is decreased T1 and increased T2 signal fluid inferior to the right femoral neck prosthesis, suggesting a complex joint effusion with mild-to-moderate joint capsule thickening. This fluid extends into the right obturator externus muscle, measuring up to 4.6 x 2.3 x 2.6 cm (transverse by AP by craniocaudal; axial series 6, image 41 and coronal series 10, image 9). There is also a thin connection of this fluid extending to mild fluid within the right iliopsoas bursa (axial series 6 images 13 through 33). Overall, this fluid appears to represent complex joint fluid extending into the right iliopsoas bursa. Within the limitations of metallic artifact, there also may be joint fluid extending inferior to the right femoral prosthetic head-neck junction (axial series 6, image 21 and coronal series 9, image 20). There also appears to be posterior extension of joint fluid (axial series 6, image 24) into a fluid collection wrapping around the posterior aspect of the right greater trochanter (axial series 6, image 23 and coronal series 10, image 28), measuring up to approximately 5.0 x 1.9 x 3.2 cm (transverse by AP by craniocaudal). There are mild septations within this fluid, and there is additional adjacent septated fluid extending more superiorly, into the right gluteus medius muscle (axial series 6, image 9 and coronal series 10, image 23) measuring up tor 4.7 x 2.3 x 2.0 cm (transverse by AP by craniocaudal. This is  suspicious for infected joint fluid and developing abscesses. Muscles and tendons Muscles and tendons: Complex joint fluid extending into the right obturator externus muscle as above. Moderate right gluteus minimus and medius muscle edema. Focal edema within the anterior aspect of the left gluteus medius muscle (axial series 6, image 1). Other findings Miscellaneous: There is metallic susceptibility artifact in the region of the posterior right hip/buttock (axial series 6, image 11 and coronal series 9, image 6). Recommend clinical correlation for possible surgical skin staples in this region. IMPRESSION: 1. Status post total right hip arthroplasty. 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 2. There is complex joint fluid inferior to the right femoral neck prosthesis with mild-to-moderate joint capsule thickening. This fluid extends into the right obturator externus muscle and into the right iliopsoas bursa. There also appears to be posterior extension of joint fluid into a fluid collection wrapping around the posterior aspect of the right greater trochanter and extending superiorly into the right gluteus medius muscle. This is suspicious for right humerus however septic arthritis with the infected joint fluid extending peripherally. 3. Moderate right gluteus minimus and medius muscle edema. Focal edema within the anterior aspect of the left gluteus medius muscle. 4. There is metallic susceptibility artifact in the region of the posterior right hip/buttock. Recommend clinical correlation for possible surgical skin staples in this region. Electronically Signed   By: Neita Garnet M.D.   On: 12/10/2023 14:35   MR CERVICAL SPINE WO CONTRAST Result Date: 12/10/2023 CLINICAL DATA:  Neck pain, infection suspected,  no prior imaging EXAM: MRI CERVICAL SPINE WITHOUT CONTRAST TECHNIQUE: Multiplanar, multisequence MR imaging of the cervical spine was performed. No intravenous contrast was administered. COMPARISON:  None Available. FINDINGS: Severely motion limited study. Postcontrast imaging was performed due to patient intolerance. Within this limitation: Alignment: No substantial sagittal subluxation. Vertebrae: Limited evaluation due to motion. Significant edema surrounding the left C2-C3 facet joint and extending to involve the posterior left C2 and C3 vertebral bodies. Cord: Poorly evaluated due to motion. No obvious cord signal abnormality. Posterior Fossa, vertebral arteries, paraspinal tissues: Paraspinal edema surrounding the left C2-C3 facet joint. Disc levels: No significant canal stenosis. Nondiagnostic evaluation of the foramina due to severe motion on axial sequences. IMPRESSION: Severely motion limited noncontrast study. Significant marrow edema surrounding the left C2-C3 facet joint and extending to involve the posterior left C2 and C3 vertebral bodies with surrounding paraspinal edema, concerning for septic arthritis and possibly discitis/osteomyelitis given the reported clinical concern for infection. A repeat MRI of the cervical spine with contrast is recommended when the patient is able (possibly with sedation) to better evaluate. These results will be called to the ordering clinician or representative by the Radiologist Assistant, and communication documented in the PACS or [employer]. Electronically Signed   By: Feliberto Harts M.D.   On: 12/10/2023 02:10   MR BRAIN WO CONTRAST Result Date: 12/10/2023 CLINICAL DATA:  Brain abscess EXAM: MRI HEAD WITHOUT CONTRAST TECHNIQUE: Multiplanar, multiecho pulse sequences of the brain and surrounding structures were obtained without intravenous contrast. COMPARISON:  None Available. FINDINGS: Brain: No acute infarction, hemorrhage, hydrocephalus, extra-axial  collection or mass lesion. Vascular: Major arterial flow voids are maintained at the skull base. Skull and upper cervical spine: Normal marrow signal. Sinuses/Orbits: Clear sinuses.  No acute orbital findings. Other: No mastoid effusions. IMPRESSION: No evidence of acute intracranial abnormality. Electronically Signed   By: Feliberto Harts M.D.   On: 12/10/2023 02:03   ECHOCARDIOGRAM COMPLETE Result Date: 12/09/2023    ECHOCARDIOGRAM REPORT   Patient Name:   CHANNING YEAGER Date of Exam: 12/09/2023 Medical Rec #:  [id number]    Height:       73.0 in Accession #:    [id number]   Weight:       124.1 lb Date of Birth:  05-30-1961    BSA:          1.757 m Patient Age:    63 years     BP:  123/81 mmHg Patient Gender: M            HR:           80 bpm. Exam Location:  Inpatient Procedure: 2D Echo, Cardiac Doppler, Color Doppler and Intracardiac            Opacification Agent (Both Spectral and Color Flow Doppler were            utilized during procedure). Indications:    Bacteremia  History:        Patient has no prior history of Echocardiogram examinations.                 CHF, COPD; Risk Factors:IVDA.  Sonographer:    Amy Chionchio Referring Phys: [id number] Ellsworth Lennox IMPRESSIONS  1. Left ventricular ejection fraction, by estimation, is 20 to 25%. The left ventricle has severely decreased function. The left ventricle demonstrates global hypokinesis. The left ventricular internal cavity size was mildly dilated. Left ventricular diastolic parameters are consistent with Grade I diastolic dysfunction (impaired relaxation). No LV thrombus noted.  2. Right ventricular systolic function is mildly reduced. The right ventricular size is normal. Tricuspid regurgitation signal is inadequate for assessing PA pressure.  3. The mitral valve is normal in structure. No evidence of mitral valve regurgitation. No evidence of mitral stenosis. Moderate mitral annular calcification.  4. The aortic valve was not well  visualized. Aortic valve regurgitation is not visualized. No aortic stenosis is present.  5. IVC not visualized. FINDINGS  Left Ventricle: Left ventricular ejection fraction, by estimation, is 20 to 25%. The left ventricle has severely decreased function. The left ventricle demonstrates global hypokinesis. Definity contrast agent was given IV to delineate the left ventricular endocardial borders. The left ventricular internal cavity size was mildly dilated. There is no left ventricular hypertrophy. Left ventricular diastolic parameters are consistent with Grade I diastolic dysfunction (impaired relaxation). Right Ventricle: The right ventricular size is normal. No increase in right ventricular wall thickness. Right ventricular systolic function is mildly reduced. Tricuspid regurgitation signal is inadequate for assessing PA pressure. Left Atrium: Left atrial size was normal in size. Right Atrium: Right atrial size was normal in size. Pericardium: There is no evidence of pericardial effusion. Mitral Valve: The mitral valve is normal in structure. Moderate mitral annular calcification. No evidence of mitral valve regurgitation. No evidence of mitral valve stenosis. MV peak gradient, 2.4 mmHg. The mean mitral valve gradient is 1.0 mmHg. Tricuspid Valve: The tricuspid valve is normal in structure. Tricuspid valve regurgitation is not demonstrated. Aortic Valve: The aortic valve was not well visualized. Aortic valve regurgitation is not visualized. No aortic stenosis is present. Aortic valve mean gradient measures 3.0 mmHg. Aortic valve peak gradient measures 4.7 mmHg. Aortic valve area, by VTI measures 2.31 cm. Pulmonic Valve: The pulmonic valve was normal in structure. Pulmonic valve regurgitation is not visualized. Aorta: The aortic root is normal in size and structure. Venous: IVC not visualized. The inferior vena cava was not well visualized. IAS/Shunts: No atrial level shunt detected by color flow Doppler.  LEFT  VENTRICLE PLAX 2D LVOT diam:     2.20 cm   Diastology LV SV:         33        LV e' medial:    5.00 cm/s LV SV Index:   19        LV E/e' medial:  9.1 LVOT Area:     3.80 cm  LV e' lateral:   5.87  cm/s                          LV E/e' lateral: 7.7  RIGHT VENTRICLE TAPSE (M-mode): 1.7 cm AORTIC VALVE AV Area (Vmax):    2.37 cm AV Area (Vmean):   2.15 cm AV Area (VTI):     2.31 cm AV Vmax:           108.00 cm/s AV Vmean:          78.600 cm/s AV VTI:            0.141 m AV Peak Grad:      4.7 mmHg AV Mean Grad:      3.0 mmHg LVOT Vmax:         67.20 cm/s LVOT Vmean:        44.500 cm/s LVOT VTI:          0.086 m LVOT/AV VTI ratio: 0.61 MITRAL VALVE MV Area (PHT): 2.96 cm    SHUNTS MV Area VTI:   2.19 cm    Systemic VTI:  0.09 m MV Peak grad:  2.4 mmHg    Systemic Diam: 2.20 cm MV Mean grad:  1.0 mmHg MV Vmax:       0.78 m/s MV Vmean:      46.7 cm/s MV Decel Time: 256 msec MV E velocity: 45.40 cm/s MV A velocity: 67.70 cm/s MV E/A ratio:  0.67 Dalton McleanMD Electronically signed by Wilfred Lacy Signature Date/Time: 12/09/2023/1:22:54 PM    Final     Cardiac Studies   TTE 12/09/23:  1. Left ventricular ejection fraction, by estimation, is 20 to 25%. The  left ventricle has severely decreased function. The left ventricle  demonstrates global hypokinesis. The left ventricular internal cavity size  was mildly dilated. Left ventricular  diastolic parameters are consistent with Grade I diastolic dysfunction  (impaired relaxation). No LV thrombus noted.   2. Right ventricular systolic function is mildly reduced. The right  ventricular size is normal. Tricuspid regurgitation signal is inadequate  for assessing PA pressure.   3. The mitral valve is normal in structure. No evidence of mitral valve  regurgitation. No evidence of mitral stenosis. Moderate mitral annular  calcification.   4. The aortic valve was not well visualized. Aortic valve regurgitation  is not visualized. No aortic stenosis is  present.   5. IVC not visualized.   Patient Profile     63 y.o. male with a hx of chronic HFrEF (EF 40% in 12/2019, 20-25% in 04/2021, 15-20% in 05/2021 and 09/2021, 20-25% in 09/2021, < 15% in 02/2022, 11/2022 and 06/2023), CAD (cath in 02/2022 showing multivessel CAD with 50% LMCA stenosis, 80% mid RCA stenosis, 80% D2 stenosis, 40% proximal LAD stenosis, 60% mid/distal LAD stenosis, 70% OM1 stenosis and 70% mid LCx stenosis --> CT surgery consulted and ultimately decision was for medical management given his ongoing drug use), COPD, HTN, HLD, history of PE/DVT, homelessness and ongoing polysubstance abuse (UDS in 10/2023 positive for amphetamines and cocaine) who is being seen  for the evaluation of chronic HFrEF and need for TEE   Assessment & Plan    1. Chronic HFrEF - He has a longstanding cardiomyopathy dating back to at least 2021 and EF was previously less than 15% and read as being at 20 to 25% by most recent echocardiogram earlier this month.  Echo this admission with EF 20 to 25%.  He has been restarted on Spironolactone 25 mg daily,  Torsemide 20 mg daily and Toprol-XL 25 mg daily.  Stop Toprol-XL and switch to carvedilol given cocaine use. Would be hesitant to start Entresto given variable BP and would not use an SGLT2 inhibitor given his infection risk. Not a candidate for advanced therapies given ongoing substance abuse. Overall, his prognosis is very poor given his longstanding cardiomyopathy, advanced CAD, medication noncompliance, homelessness and drug use. -Rising Cr, appears euvolemic, will hold torsemide   2.  CAD - Prior cardiac catheterization 02/2022 showed multivessel CAD as outlined above, including 50% LMCA stenosis.  CT surgery was consulted at that time and recommended a cardiac MRI for viability but he declined and he was overall felt to be a poor surgical candidate given continued drug use and homelessness. - He has not been on ASA given the need for anticoagulation with  Eliquis. Likely not on statin due to noncompliance. LDL 59   3. COPD - CT imaging has shown emphysema and chronic bronchitis. Not on inhalers or nebulizers routinely.   4.  MRSA bacteremia - ID consult is pending and recommended TEE.  He was evaluated on 3/23 and not felt to be candidate.  Repeat TTE this admission, no clear vegetation seen.  Antibiotics per ID  5. History of PE/DVT - He has been continued on Eliquis 5mg  BID for anticoagulation.    6. Hyponatremia - Appears to be chronic by review of labs from [facility]. Na+ was at 130 on admission, downtrending to 126 3/23.  Currently 128, will monitor   For questions or updates, please contact [facility] Please consult [email or web address] for contact info under        Signed, Little Ishikawa, MD  12/11/2023, 10:21 AM

## 2023-12-11 NOTE — Progress Notes (Signed)
 PT Cancellation Note  Patient Details Name: [name] [name] MRN: [id number] DOB: Dec 05, 1960   Cancelled Treatment:    Reason Eval/Treat Not Completed: Pain limiting ability to participate; patient reported in too much pain though educated on LE ROM And turning for preventing decubiti.  He declined assistance with repositioning.  RN aware. Will continue attempts.    Elray Mcgregor 12/11/2023, 2:17 PM Sheran Lawless, PT Acute Rehabilitation Services Office:[phone] 12/11/2023

## 2023-12-11 NOTE — Progress Notes (Signed)
 PROGRESS NOTE                                                                                                                                                                                                             Patient Demographics:    [name] [name], is a 63 y.o. male, DOB - September 02, 1961, WUJ:[id number]  Outpatient Primary MD for the patient is Patient, No Pcp Per    LOS - 4  Admit date - 12/06/2023    Chief Complaint  Patient presents with   Abdominal Pain       Brief Narrative (HPI from H&P)    63 y.o. 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   He did not want to go back to [facility], he was also due for a TEE and was admitted to the hospital here for further care.   Subjective:   Patient in bed appears to be in no distress denies any headache chest pain, mid back pain ongoing which has been present for several months, minimal right hip pain.  No shortness of breath.   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    For now continue IV Vancomycin, defer course and duration of antibiotics to ID.  He has remote history of IV drug use and methamphetamine use, currently  denies all use.  UDS negative here.  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 Conchita Paris who reviewed the images, medical treatment only.  CT right hip.  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  Of note he has minimal discomfort in his right hip.  Of note patient was to get Dalbavancin prior to [facility] but appears that he never received it.    Lower back pain, compression fractures in lumbar spine vertebrae, T12, L2-L3 L5 - repeat MRI L-spine this admission stable, supportive care.  No signs of acute infection   Lactic acidosis - due to dehydration.  Has been hydrated.   Chronic HFrEF - LVEF 20 to 25%, Euvolemic on exam, home diuretics resumed cardiology to evaluate.   Possible acute pyelonephritis seen on CT scan, POA  - Continue IV antibiotics, As needed analgesics, follow urine culture.   History of PE/DVT on Eliquis  Resume home Eliquis also has IVC filter.   History of polysubstance abuse  Denies recent use of IV drugs   Tobacco use disorder  - Current smoker, Nicotine patch   Generalized weakness  PT OT assessment, Fall precautions   Severe protein calorie malnutrition - Dietitian consult, protein supplementation  Hyponatremia.  Clinically appears to be secondary to dehydration, hold diuretics and gently hydrate on 12/11/2023 and monitor.    Incidental finding of right inguinal hernia.  No acute issues.  Monitor  No IV access.  PICC line placed 12/09/2023 after much counseling       Condition - Extremely Guarded  Family Communication  :  None present  Code Status :   Full  Consults  :  ID, Cards, Dr. Conchita Paris Neurosurgery, orthopedics  PUD Prophylaxis :  Pepcid   Procedures  :      PICC line placed 12/09/2023   CT R Hip - 1. Status post total right hip arthroplasty. 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 2. There is complex joint fluid inferior to the right femoral neck prosthesis with mild-to-moderate joint capsule thickening. This fluid extends into the right obturator externus muscle and into the right iliopsoas bursa. There also appears to be posterior extension of joint fluid into a fluid collection wrapping around the posterior aspect of the right greater trochanter and extending superiorly into the right gluteus medius muscle. This is suspicious for right humerus however septic arthritis with the infected joint fluid extending peripherally. 3. Moderate right gluteus minimus and medius muscle edema. Focal edema within the anterior aspect of the left gluteus medius muscle. 4. There is metallic susceptibility artifact in the region of the posterior right hip/buttock. Recommend clinical correlation for possible surgical skin staples in this region.   TTE - 1. Left ventricular ejection fraction, by estimation, is 20 to 25%. The left ventricle has severely decreased function. The left ventricle demonstrates global hypokinesis. The left ventricular internal cavity size was mildly dilated. Left ventricular diastolic parameters are consistent with Grade I diastolic dysfunction (impaired relaxation). No LV thrombus noted.  2. Right ventricular systolic function is mildly reduced. The right ventricular size is normal. Tricuspid regurgitation signal is inadequate for assessing PA pressure.  3. The mitral valve is normal in structure. No evidence of mitral valve regurgitation. No evidence of mitral stenosis. Moderate mitral annular calcification.  4. The aortic valve was not well  visualized. Aortic valve regurgitation is not visualized. No aortic stenosis is present.  5. IVC not visualized.  MRI brain.  Nonacute.    MRI C-spine.  Severely motion limited noncontrast study. Significant marrow edema surrounding the left C2-C3 facet joint and extending to involve the posterior left C2 and C3 vertebral bodies with surrounding paraspinal edema, concerning for septic arthritis and possibly discitis/osteomyelitis given the reported clinical concern for infection. A repeat MRI of the cervical spine with contrast is recommended when the patient is able (possibly with sedation) to better evaluate.  MRI L-spine.  1. Chronic compression fractures of T12, L2, L3, and L5 with no complicating features. 2. Subtle and nonspecific bilateral erector spinae muscle edema. A similar appearance can be seen in chronically debilitated patients. 3. 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 4. Capacious lumbar spinal canal with no spinal stenosis  CT scan abdomen pelvis.1. Emphysematous changes and chronic bronchitic changes in the lung bases. Trace bilateral pleural effusions. 2. Heterogeneous nephrograms bilaterally suggesting acute pyelonephritis. No abscess or hydronephrosis. 3. Moderate-sized right inguinal hernia containing bowel without obvious proximal obstruction. Visualization is limited due to streak artifact. 4. Aortic atherosclerosis.  Inferior vena caval filter. 5. Small amount of free fluid in the abdomen of nonspecific etiology      Disposition Plan  :    Status is: Inpatient   DVT Prophylaxis  :     apixaban (ELIQUIS) tablet 5 mg     Lab Results  Component Value Date   PLT 451 (H) 12/11/2023    Diet :  Diet Order             Diet regular Fluid consistency: Thin; Fluid restriction: 1200 mL Fluid  Diet effective now                    Inpatient  Medications  Scheduled Meds:  apixaban  5 mg Oral BID   atorvastatin  80 mg Oral QHS   carvedilol  3.125 mg Oral BID WC   Chlorhexidine Gluconate Cloth  6 each Topical Daily   famotidine  20 mg Oral Daily   feeding supplement  237 mL Oral BID BM   multivitamin with minerals  1 tablet Oral Daily   saccharomyces boulardii  250 mg Oral BID   [START ON 12/12/2023] spironolactone  25 mg Oral Daily   [START ON 12/12/2023] torsemide  20 mg Oral Daily   Continuous Infusions:  lactated ringers 50 mL/hr at 12/11/23 0631   vancomycin Stopped (12/10/23 1358)   PRN Meds:.acetaminophen, HYDROmorphone (DILAUDID) injection, melatonin, naLOXone (NARCAN)  injection, oxyCODONE, polyethylene glycol, prochlorperazine, sodium chloride flush     Objective:   Vitals:   12/10/23 1600 12/10/23 1916 12/10/23 2327 12/11/23 0400  BP: 106/75 (!) 89/73 113/77 119/79  Pulse: 88  88 90  Resp: 15 18 17 17   Temp:  97.7 F (36.5 C) 97.8 F (36.6 C) (!) 97.2 F (36.2 C)  TempSrc:  Oral Oral Oral  SpO2: 94%  92% 92%  Weight:   56.4 kg   Height:        Wt Readings from Last 3 Encounters:  12/10/23 56.4 kg  10/23/18 72.6 kg  09/09/18 70.3 kg     Intake/Output Summary (Last 24 hours) at 12/11/2023 0734 Last data filed at 12/10/2023 1700 Gross per 24 hour  Intake 1150 ml  Output 1300 ml  Net -150 ml     Physical Exam  Awake Alert, No new F.N deficits, Normal affect [place].AT,PERRAL Supple Neck, No JVD,   Symmetrical Chest wall movement, Good  air movement bilaterally, CTAB RRR  +ve B.Sounds, Abd Soft, No tenderness,   No Cyanosis, Clubbing or edema      Data Review:    Recent Labs  Lab 12/06/23 2028 12/07/23 0455 12/08/23 0522 12/09/23 0533 12/10/23 0449 12/11/23 0314  WBC 11.0* 7.8 7.9 7.5 6.8 9.1  HGB 11.8* 9.8* 10.7* 12.2* 9.7* 10.5*  HCT 36.6* 29.8* 32.2* 36.8* 29.5* 32.1*  PLT 432* 403* 436* 502* 392 451*  MCV 89.9 86.6 85.9 86.8 87.3 86.8  MCH 29.0 28.5 28.5 28.8 28.7 28.4  MCHC  32.2 32.9 33.2 33.2 32.9 32.7  RDW 16.2* 16.4* 16.0* 15.9* 16.1* 16.2*  LYMPHSABS 0.9  --  0.9 1.3 0.8 0.9  MONOABS 0.9  --  0.8 0.6 0.6 0.4  EOSABS 0.0  --  0.1 0.2 0.2 0.1  BASOSABS 0.0  --  0.0 0.1 0.0 0.0    Recent Labs  Lab 12/06/23 2028 12/06/23 2103 12/06/23 2203 12/06/23 2359 12/07/23 0637 12/07/23 0641 12/07/23 1500 12/08/23 0522 12/08/23 0556 12/09/23 0533 12/10/23 0449 12/11/23 0314  NA 130*  --   --   --   --  128*  --  126*  --  129* 128* 128*  K 4.2  --   --   --   --  4.2  --  3.9  --  3.8 3.8 3.8  CL 96*  --   --   --   --  98  --  97*  --  93* 92* 92*  CO2 19*  --   --   --   --  22  --  23  --  27 26 26   ANIONGAP 15  --   --   --   --  8  --  6  --  9 10 10   GLUCOSE 80  --   --   --   --  102*  --  91  --  91 100* 98  BUN 14  --   --   --   --  12  --  10  --  14 20 30*  CREATININE 1.05  --   --   --   --  0.90  --  0.78  --  1.04 1.18 1.27*  AST 31  --   --   --   --   --   --   --   --  31 26 19   ALT 27  --   --   --   --   --   --   --   --  25 23 19   ALKPHOS 70  --   --   --   --   --   --   --   --  72 58 66  BILITOT 0.8  --   --   --   --   --   --   --   --  0.6 0.5 0.8  ALBUMIN 2.3*  --   --   --   --   --   --   --   --  2.3* 2.1* 2.0*  CRP  --   --   --   --   --   --   --   --  5.9* 5.5* 3.6* 4.4*  PROCALCITON  --   --   --   --   --   --   --  <0.10  --  <0.10  --   --  LATICACIDVEN  --  2.5*  --  2.3* 1.8  --  1.6  --   --   --   --   --   INR  --   --  1.6*  --   --   --   --   --   --   --   --   --   BNP  --   --   --   --   --   --   --   --  965.0* 424.6* 154.1* 236.1*  MG  --   --   --   --   --  1.6*  --  1.6*  --  1.8 1.5* 2.4  PHOS  --   --   --   --   --  2.9  --   --   --   --   --   --   CALCIUM 9.1  --   --   --   --  8.7*  --  8.5*  --  9.2 8.8* 8.8*      Recent Labs  Lab 12/06/23 2103 12/06/23 2203 12/06/23 2359 12/07/23 1610 12/07/23 0641 12/07/23 1500 12/08/23 0522 12/08/23 0556 12/09/23 0533 12/10/23 0449  12/11/23 0314  CRP  --   --   --   --   --   --   --  5.9* 5.5* 3.6* 4.4*  PROCALCITON  --   --   --   --   --   --  <0.10  --  <0.10  --   --   LATICACIDVEN 2.5*  --  2.3* 1.8  --  1.6  --   --   --   --   --   INR  --  1.6*  --   --   --   --   --   --   --   --   --   BNP  --   --   --   --   --   --   --  965.0* 424.6* 154.1* 236.1*  MG  --   --   --   --  1.6*  --  1.6*  --  1.8 1.5* 2.4  CALCIUM  --   --   --   --  8.7*  --  8.5*  --  9.2 8.8* 8.8*    --------------------------------------------------------------------------------------------------------------- Lab Results  Component Value Date   CHOL 114 12/09/2023   HDL 43 12/09/2023   LDLCALC 59 12/09/2023   TRIG 59 12/09/2023   CHOLHDL 2.7 12/09/2023    No results found for: "HGBA1C" No results for input(s): "TSH", "T4TOTAL", "FREET4", "T3FREE", "THYROIDAB" in the last 72 hours. No results for input(s): "VITAMINB12", "FOLATE", "FERRITIN", "TIBC", "IRON", "RETICCTPCT" in the last 72 hours. ------------------------------------------------------------------------------------------------------------------ Cardiac Enzymes No results for input(s): "CKMB", "TROPONINI", "MYOGLOBIN" in the last 168 hours.  Invalid input(s): "CK"  Micro Results Recent Results (from the past 240 hours)  Culture, blood (Routine x 2)     Status: None (Preliminary result)   Collection Time: 12/06/23  8:28 PM   Specimen: BLOOD LEFT FOREARM  Result Value Ref Range Status   Specimen Description BLOOD LEFT FOREARM  Final   Special Requests   Final    BOTTLES DRAWN AEROBIC ONLY Blood Culture results may not be optimal due to an inadequate volume of blood received in culture bottles   Culture   Final    NO GROWTH 4  DAYS Performed at [facility], [address], [place], [place] [address]    Report Status PENDING  Incomplete  Culture, blood (Routine x 2)     Status: None (Preliminary result)   Collection Time: 12/06/23  8:33 PM   Specimen:  BLOOD RIGHT FOREARM  Result Value Ref Range Status   Specimen Description BLOOD RIGHT FOREARM  Final   Special Requests   Final    BOTTLES DRAWN AEROBIC AND ANAEROBIC Blood Culture results may not be optimal due to an inadequate volume of blood received in culture bottles   Culture   Final    NO GROWTH 4 DAYS Performed at [facility], [address], [place], [place] [address]    Report Status PENDING  Incomplete    Radiology Report MR HIP RIGHT WO CONTRAST Result Date: 12/10/2023 CLINICAL DATA:  Septic arthritis. 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 Acute on chronic right hip pain. MRSA bacteremia. Acute on chronic right hip pain. EXAM: MR OF THE RIGHT HIP WITHOUT CONTRAST TECHNIQUE: Multiplanar, multisequence MR imaging was performed. No intravenous contrast was administered. The technologist reports the study was limited due to patient's evaluate tolerate the exam protocol. Best images possible were obtained within the limitations of patient motion and metallic artifact. The patient refused to continue for postcontrast portion of the study. COMPARISON:  CT abdomen and pelvis 12/06/2023 FINDINGS: Despite efforts by the technologist and patient, mild to moderate motion artifact is present on today's exam and could not be eliminated. This reduces exam sensitivity and specificity. Bones/joints/cartilage: There is metallic susceptibility artifact from total right hip arthroplasty. 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 There is decreased T1 and increased T2 signal fluid inferior to the right femoral neck prosthesis, suggesting a complex joint effusion with mild-to-moderate joint capsule thickening. This fluid extends into the right obturator externus  muscle, measuring up to 4.6 x 2.3 x 2.6 cm (transverse by AP by craniocaudal; axial series 6, image 41 and coronal series 10, image 9). There is also a thin connection of this fluid extending to mild fluid within the right iliopsoas bursa (axial series 6 images 13 through 33). Overall, this fluid appears to represent complex joint fluid extending into the right iliopsoas bursa. Within the limitations of metallic artifact, there also may be joint fluid extending inferior to the right femoral prosthetic head-neck junction (axial series 6, image 21 and coronal series 9, image 20). There also appears to be posterior extension of joint fluid (axial series 6, image 24) into a fluid collection wrapping around the posterior aspect of the right greater trochanter (axial series 6, image 23 and coronal series 10, image 28), measuring up to approximately 5.0 x 1.9 x 3.2 cm (transverse by AP by craniocaudal). There are mild septations within this fluid, and there is additional adjacent septated fluid extending more superiorly, into the right gluteus medius muscle (axial series 6, image 9 and coronal series 10, image 23) measuring up tor 4.7 x 2.3 x 2.0 cm (transverse by AP by craniocaudal. This is suspicious for infected joint fluid and developing abscesses. Muscles and tendons Muscles and tendons: Complex joint fluid extending into the right obturator externus muscle as above. Moderate right gluteus minimus and medius muscle edema. Focal edema within the anterior aspect of the  left gluteus medius muscle (axial series 6, image 1). Other findings Miscellaneous: There is metallic susceptibility artifact in the region of the posterior right hip/buttock (axial series 6, image 11 and coronal series 9, image 6). Recommend clinical correlation for possible surgical skin staples in this region. IMPRESSION: 1. Status post total right hip arthroplasty. As seen on recent 12/06/2023 CT, there is high-grade right protrusio acetabuli, with  the right acetabular cup extending superiorly and medially through the superomedial right acetabulum with complete loss of bone within this region of the acetabulum. 2. There is complex joint fluid inferior to the right femoral neck prosthesis with mild-to-moderate joint capsule thickening. This fluid extends into the right obturator externus muscle and into the right iliopsoas bursa. There also appears to be posterior extension of joint fluid into a fluid collection wrapping around the posterior aspect of the right greater trochanter and extending superiorly into the right gluteus medius muscle. This is suspicious for right humerus however septic arthritis with the infected joint fluid extending peripherally. 3. Moderate right gluteus minimus and medius muscle edema. Focal edema within the anterior aspect of the left gluteus medius muscle. 4. There is metallic susceptibility artifact in the region of the posterior right hip/buttock. Recommend clinical correlation for possible surgical skin staples in this region. Electronically Signed   By: Neita Garnet M.D.   On: 12/10/2023 14:35   MR CERVICAL SPINE WO CONTRAST Result Date: 12/10/2023 CLINICAL DATA:  Neck pain, infection suspected, no prior imaging EXAM: MRI CERVICAL SPINE WITHOUT CONTRAST TECHNIQUE: Multiplanar, multisequence MR imaging of the cervical spine was performed. No intravenous contrast was administered. COMPARISON:  None Available. FINDINGS: Severely motion limited study. Postcontrast imaging was performed due to patient intolerance. Within this limitation: Alignment: No substantial sagittal subluxation. Vertebrae: Limited evaluation due to motion. Significant edema surrounding the left C2-C3 facet joint and extending to involve the posterior left C2 and C3 vertebral bodies. Cord: Poorly evaluated due to motion. No obvious cord signal abnormality. Posterior Fossa, vertebral arteries, paraspinal tissues: Paraspinal edema surrounding the left C2-C3  facet joint. Disc levels: No significant canal stenosis. Nondiagnostic evaluation of the foramina due to severe motion on axial sequences. IMPRESSION: Severely motion limited noncontrast study. Significant marrow edema surrounding the left C2-C3 facet joint and extending to involve the posterior left C2 and C3 vertebral bodies with surrounding paraspinal edema, concerning for septic arthritis and possibly discitis/osteomyelitis given the reported clinical concern for infection. A repeat MRI of the cervical spine with contrast is recommended when the patient is able (possibly with sedation) to better evaluate. These results will be called to the ordering clinician or representative by the Radiologist Assistant, and communication documented in the PACS or [employer]. Electronically Signed   By: Feliberto Harts M.D.   On: 12/10/2023 02:10   MR BRAIN WO CONTRAST Result Date: 12/10/2023 CLINICAL DATA:  Brain abscess EXAM: MRI HEAD WITHOUT CONTRAST TECHNIQUE: Multiplanar, multiecho pulse sequences of the brain and surrounding structures were obtained without intravenous contrast. COMPARISON:  None Available. FINDINGS: Brain: No acute infarction, hemorrhage, hydrocephalus, extra-axial collection or mass lesion. Vascular: Major arterial flow voids are maintained at the skull base. Skull and upper cervical spine: Normal marrow signal. Sinuses/Orbits: Clear sinuses.  No acute orbital findings. Other: No mastoid effusions. IMPRESSION: No evidence of acute intracranial abnormality. Electronically Signed   By: Feliberto Harts M.D.   On: 12/10/2023 02:03   ECHOCARDIOGRAM COMPLETE Result Date: 12/09/2023    ECHOCARDIOGRAM REPORT   Patient Name:   [facility]  Date of Exam: 12/09/2023 Medical Rec #:  [id number]    Height:       73.0 in Accession #:    [id number]   Weight:       124.1 lb Date of Birth:  [birth year]    BSA:          1.757 m Patient Age:    63 years     BP:           123/81 mmHg Patient Gender: M             HR:           80 bpm. Exam Location:  Inpatient Procedure: 2D Echo, Cardiac Doppler, Color Doppler and Intracardiac            Opacification Agent (Both Spectral and Color Flow Doppler were            utilized during procedure). Indications:    Bacteremia  History:        Patient has no prior history of Echocardiogram examinations.                 CHF, COPD; Risk Factors:IVDA.  Sonographer:    Amy Chionchio Referring Phys: [id number] Ellsworth Lennox IMPRESSIONS  1. Left ventricular ejection fraction, by estimation, is 20 to 25%. The left ventricle has severely decreased function. The left ventricle demonstrates global hypokinesis. The left ventricular internal cavity size was mildly dilated. Left ventricular diastolic parameters are consistent with Grade I diastolic dysfunction (impaired relaxation). No LV thrombus noted.  2. Right ventricular systolic function is mildly reduced. The right ventricular size is normal. Tricuspid regurgitation signal is inadequate for assessing PA pressure.  3. The mitral valve is normal in structure. No evidence of mitral valve regurgitation. No evidence of mitral stenosis. Moderate mitral annular calcification.  4. The aortic valve was not well visualized. Aortic valve regurgitation is not visualized. No aortic stenosis is present.  5. IVC not visualized. FINDINGS  Left Ventricle: Left ventricular ejection fraction, by estimation, is 20 to 25%. The left ventricle has severely decreased function. The left ventricle demonstrates global hypokinesis. Definity contrast agent was given IV to delineate the left ventricular endocardial borders. The left ventricular internal cavity size was mildly dilated. There is no left ventricular hypertrophy. Left ventricular diastolic parameters are consistent with Grade I diastolic dysfunction (impaired relaxation). Right Ventricle: The right ventricular size is normal. No increase in right ventricular wall thickness. Right ventricular systolic  function is mildly reduced. Tricuspid regurgitation signal is inadequate for assessing PA pressure. Left [facility]: Left atrial size was normal in size. Right [facility]: Right atrial size was normal in size. Pericardium: There is no evidence of pericardial effusion. Mitral Valve: The mitral valve is normal in structure. Moderate mitral annular calcification. No evidence of mitral valve regurgitation. No evidence of mitral valve stenosis. MV peak gradient, 2.4 mmHg. The mean mitral valve gradient is 1.0 mmHg. Tricuspid Valve: The tricuspid valve is normal in structure. Tricuspid valve regurgitation is not demonstrated. Aortic Valve: The aortic valve was not well visualized. Aortic valve regurgitation is not visualized. No aortic stenosis is present. Aortic valve mean gradient measures 3.0 mmHg. Aortic valve peak gradient measures 4.7 mmHg. Aortic valve area, by VTI measures 2.31 cm. Pulmonic Valve: The pulmonic valve was normal in structure. Pulmonic valve regurgitation is not visualized. Aorta: The aortic root is normal in size and structure. Venous: IVC not visualized. The inferior vena cava was not well visualized. IAS/Shunts: No atrial level shunt  detected by color flow Doppler.  LEFT VENTRICLE PLAX 2D LVOT diam:     2.20 cm   Diastology LV SV:         33        LV e' medial:    5.00 cm/s LV SV Index:   19        LV E/e' medial:  9.1 LVOT Area:     3.80 cm  LV e' lateral:   5.87 cm/s                          LV E/e' lateral: 7.7  RIGHT VENTRICLE TAPSE (M-mode): 1.7 cm AORTIC VALVE AV Area (Vmax):    2.37 cm AV Area (Vmean):   2.15 cm AV Area (VTI):     2.31 cm AV Vmax:           108.00 cm/s AV Vmean:          78.600 cm/s AV VTI:            0.141 m AV Peak Grad:      4.7 mmHg AV Mean Grad:      3.0 mmHg LVOT Vmax:         67.20 cm/s LVOT Vmean:        44.500 cm/s LVOT VTI:          0.086 m LVOT/AV VTI ratio: 0.61 MITRAL VALVE MV Area (PHT): 2.96 cm    SHUNTS MV Area VTI:   2.19 cm    Systemic VTI:  0.09 m MV Peak  grad:  2.4 mmHg    Systemic Diam: 2.20 cm MV Mean grad:  1.0 mmHg MV Vmax:       0.78 m/s MV Vmean:      46.7 cm/s MV Decel Time: 256 msec MV E velocity: 45.40 cm/s MV A velocity: 67.70 cm/s MV E/A ratio:  0.67 Dalton McleanMD Electronically signed by Wilfred Lacy Signature Date/Time: 12/09/2023/1:22:54 PM    Final    [place] EKG SITE RITE Result Date: 12/09/2023 If Site Rite image not attached, placement could not be confirmed due to current cardiac rhythm.    Signature  -   Susa Raring M.D on 12/11/2023 at 7:34 AM   -  To page go to [email or web address]

## 2023-12-11 NOTE — Progress Notes (Signed)
 [facility]  Date of Admission:  12/06/2023      Total days of antibiotics 4   Vanco         ASSESSMENT: [name] [name] is a 63 y.o. male admitted with:   H/O MRSA Bacteremia 11/26/23-  At outside hospital. Blood cultures when admitted 3/21 not growing any MRSA.  Intermediate resistance to tetracyclines with MIC 8  Tricuspid Valve Endocarditis (h/o IVDU) -  Cardiomyopathy -  Advanced CAD -  No surgical offerings available -  Under treated as he never went for dalvance and has had disjointed care with multiple admissions / AMA discharges.  Echo here with HFrEF 20-25% which dates back to 2021. No large vegetations seen.  Cardiology continues to follow while inpatient.  Deferred TEE this admission with no surgical options and no additional benefit from this procedure with already long indication to treat with other sites of infection.   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 He is more bothered by compression fx in lower spine than c spine -6 weeks IV abx recommended.   THR Septic Arthritis on Imaging -  Unable to bear weight > 3 weeks now. Appreciate orthopedics evaluation. Planning on aspirate from hip - cell count may be more valuable here as he has had interrupted courses of ABX targeting MRSA (which would be the likely pathogen here).  GS/Culture may be artificially negative from abx exposure.  -Cell count > GS Cx if limited fluid from aspiration  -Follow for ortho plans - given chronicity would normally warrant 2 stage, however with comorbiditis may only be eligible for DAIR. Will follow  -No plans for rifampin given DOAC chronically.  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  UDS clean when he arrived. Seems like last use was in February 2025.    PLAN: Continue vancomycin  Follow up IR aspirate of hip--cell count priority, gs/cx if enough fluid drawn off  FU ortho recs for suspected PJI    Principal Problem:   MRSA bacteremia Active Problems:   Acute midline back pain   Compression of lumbar vertebra (HCC)   Chronic multifocal osteomyelitis of right femur (HCC)   Neck pain   Septic embolism (HCC)   Cachexia (HCC)   Homeless   IVDU (intravenous drug user)   Endocarditis of tricuspid valve   Nonintractable headache   Coronary artery disease involving native coronary artery of native heart   Chronic combined systolic and diastolic heart failure (HCC)   Ischemic cardiomyopathy   Chronic obstructive pulmonary disease (HCC)   Pulmonary emphysema (HCC)   Cigarette smoker   Compression fracture of body of thoracic vertebra (HCC)   Hx of deep venous thrombosis   Hx pulmonary embolism   Protein-calorie malnutrition (HCC)   Protein-calorie malnutrition, severe    apixaban  5 mg Oral BID   atorvastatin  80 mg Oral QHS   carvedilol  3.125 mg Oral BID WC   Chlorhexidine Gluconate Cloth  6 each Topical Daily   famotidine  20 mg Oral Daily   feeding supplement  237 mL Oral BID BM  multivitamin with minerals  1 tablet Oral Daily   saccharomyces boulardii  250 mg Oral BID   [START ON 12/12/2023] spironolactone  25 mg Oral Daily    SUBJECTIVE: Back and hip pain predominate today. He thinks the hip is infected.    Review of Systems: Review of Systems  Constitutional:  Negative for chills and fever.  Musculoskeletal:  Positive for back pain and joint pain.  Skin:  Negative for rash.    Allergies  Allergen Reactions   Fentanyl Itching   Hydromorphone Hcl Itching    Pt  States itching all over Pt  States itching all over Pt  States itching all over    Morphine Itching   Pantoprazole Hives   Pantoprazole Sodium  Hives   Haloperidol Other (See Comments)    Pt reports he has "seizures" had haldol on 09/25/21 with no apparent ill effect.  Pt reports he has "seizures"   Nsaids Nausea And Vomiting, Other (See Comments) and Hives    Severe cramping Other reaction(s): Abdominal Pain Cramping per [place]   Morphine And Codeine Itching   Ibuprofen    Ketorolac Other (See Comments)   Pantoprazole Sodium Hives    OBJECTIVE: Vitals:   12/10/23 1600 12/10/23 1916 12/10/23 2327 12/11/23 0400  BP: 106/75 (!) 89/73 113/77 119/79  Pulse: 88  88 90  Resp: 15 18 17 17   Temp:  97.7 F (36.5 C) 97.8 F (36.6 C) (!) 97.2 F (36.2 C)  TempSrc:  Oral Oral Oral  SpO2: 94%  92% 92%  Weight:   56.4 kg   Height:       Body mass index is 16.4 kg/m.  Physical Exam Constitutional:      General: He is not in acute distress.    Appearance: He is well-developed. He is ill-appearing. He is not toxic-appearing.  Cardiovascular:     Rate and Rhythm: Normal rate.  Pulmonary:     Effort: Pulmonary effort is normal.     Breath sounds: Normal breath sounds.  Skin:    General: Skin is warm and dry.     Capillary Refill: Capillary refill takes less than 2 seconds.  Neurological:     Mental Status: He is alert and oriented to person, place, and time.     Lab Results Lab Results  Component Value Date   WBC 9.1 12/11/2023   HGB 10.5 (L) 12/11/2023   HCT 32.1 (L) 12/11/2023   MCV 86.8 12/11/2023   PLT 451 (H) 12/11/2023    Lab Results  Component Value Date   CREATININE 1.27 (H) 12/11/2023   BUN 30 (H) 12/11/2023   NA 128 (L) 12/11/2023   K 3.8 12/11/2023   CL 92 (L) 12/11/2023   CO2 26 12/11/2023    Lab Results  Component Value Date   ALT 19 12/11/2023   AST 19 12/11/2023   ALKPHOS 66 12/11/2023   BILITOT 0.8 12/11/2023     Microbiology: Recent Results (from the past 240 hours)  Culture, blood (Routine x 2)     Status: None   Collection Time: 12/06/23  8:28 PM   Specimen: BLOOD LEFT  FOREARM  Result Value Ref Range Status   Specimen Description BLOOD LEFT FOREARM  Final   Special Requests   Final    BOTTLES DRAWN AEROBIC ONLY Blood Culture results may not be optimal due to an inadequate volume of blood received in culture bottles   Culture   Final    NO GROWTH 5 DAYS  Performed at [facility], [address] [place], [place] [address]    Report Status 12/11/2023 FINAL  Final  Culture, blood (Routine x 2)     Status: None   Collection Time: 12/06/23  8:33 PM   Specimen: BLOOD RIGHT FOREARM  Result Value Ref Range Status   Specimen Description BLOOD RIGHT FOREARM  Final   Special Requests   Final    BOTTLES DRAWN AEROBIC AND ANAEROBIC Blood Culture results may not be optimal due to an inadequate volume of blood received in culture bottles   Culture   Final    NO GROWTH 5 DAYS Performed at [facility], [address], [place], [place] [address]    Report Status 12/11/2023 FINAL  Final     Rexene Alberts, MSN, NP-C [facility]  [email or web address] Pager: [phone] Office: [phone] [facility] Main Line: [phone] *Secure Chat Communication Welcome

## 2023-12-11 NOTE — Plan of Care (Signed)

## 2023-12-11 NOTE — Consult Note (Signed)
 Reason for Consult:Right hip pain Referring Physician: Susa Raring Time called: 0730 Time at bedside: 1001   [name] [name] is an 63 y.o. male.  HPI: [name] [name] was admitted 4d ago with MRSA bacteremia and severe back pain. He also c/o right hip pain that's been going on for quite a while but has progressed to the point where he cannot bear weight for the last 2-3 weeks. He underwent THA back in the mid-80's.  Past Medical History:  Diagnosis Date   Alcoholic (HCC)    Anxiety    COPD (chronic obstructive pulmonary disease) (HCC)    Depression    Hepatitis C    History of blood clots    Homelessness    Malingering    Peripheral neuropathy    Polysubstance abuse (HCC)     Past Surgical History:  Procedure Laterality Date   ABDOMINAL SURGERY     HIP SURGERY      Family History  Problem Relation Age of Onset   Heart failure Father    Coronary artery disease Brother     Social History:  reports that he has been smoking cigarettes. He has a 7.5 pack-year smoking history. He has never used smokeless tobacco. He reports current alcohol use. He reports that he does not currently use drugs after having used the following drugs: Cocaine and Marijuana.  Allergies:  Allergies  Allergen Reactions   Fentanyl Itching   Hydromorphone Hcl Itching    Pt  States itching all over Pt  States itching all over Pt  States itching all over    Morphine Itching   Pantoprazole Hives   Pantoprazole Sodium Hives   Haloperidol Other (See Comments)    Pt reports he has "seizures" had haldol on 09/25/21 with no apparent ill effect.  Pt reports he has "seizures"   Nsaids Nausea And Vomiting, Other (See Comments) and Hives    Severe cramping Other reaction(s): Abdominal Pain Cramping per [place]   Morphine And Codeine Itching   Ibuprofen    Ketorolac Other (See Comments)   Pantoprazole Sodium Hives    Medications: I have reviewed the patient's current medications.  Results for orders  placed or performed during the hospital encounter of 12/06/23 (from the past 48 hours)  CBC with Differential/Platelet     Status: Abnormal   Collection Time: 12/10/23  4:49 AM  Result Value Ref Range   WBC 6.8 4.0 - 10.5 K/uL   RBC 3.38 (L) 4.22 - 5.81 MIL/uL   Hemoglobin 9.7 (L) 13.0 - 17.0 g/dL   HCT 16.1 (L) 09.6 - 04.5 %   MCV 87.3 80.0 - 100.0 fL   MCH 28.7 26.0 - 34.0 pg   MCHC 32.9 30.0 - 36.0 g/dL   RDW 40.9 (H) 81.1 - 91.4 %   Platelets 392 150 - 400 K/uL   nRBC 0.0 0.0 - 0.2 %   Neutrophils Relative % 74 %   Neutro Abs 5.0 1.7 - 7.7 K/uL   Lymphocytes Relative 12 %   Lymphs Abs 0.8 0.7 - 4.0 K/uL   Monocytes Relative 9 %   Monocytes Absolute 0.6 0.1 - 1.0 K/uL   Eosinophils Relative 3 %   Eosinophils Absolute 0.2 0.0 - 0.5 K/uL   Basophils Relative 1 %   Basophils Absolute 0.0 0.0 - 0.1 K/uL   Immature Granulocytes 1 %   Abs Immature Granulocytes 0.05 0.00 - 0.07 K/uL    Comment: Performed at [facility], [address], [place],  [place] [address]  Comprehensive metabolic panel     Status: Abnormal   Collection Time: 12/10/23  4:49 AM  Result Value Ref Range   Sodium 128 (L) 135 - 145 mmol/L   Potassium 3.8 3.5 - 5.1 mmol/L   Chloride 92 (L) 98 - 111 mmol/L   CO2 26 22 - 32 mmol/L   Glucose, Bld 100 (H) 70 - 99 mg/dL    Comment: Glucose reference range applies only to samples taken after fasting for at least 8 hours.   BUN 20 8 - 23 mg/dL   Creatinine, Ser 9.14 0.61 - 1.24 mg/dL   Calcium 8.8 (L) 8.9 - 10.3 mg/dL   Total Protein 7.5 6.5 - 8.1 g/dL   Albumin 2.1 (L) 3.5 - 5.0 g/dL   AST 26 15 - 41 U/L   ALT 23 0 - 44 U/L   Alkaline Phosphatase 58 38 - 126 U/L   Total Bilirubin 0.5 0.0 - 1.2 mg/dL   GFR, Estimated >78 >29 mL/min    Comment: (NOTE) Calculated using the CKD-EPI Creatinine Equation (2021)    Anion gap 10 5 - 15    Comment: Performed at [facility], [address] [place], [place] [address]  Magnesium     Status: Abnormal    Collection Time: 12/10/23  4:49 AM  Result Value Ref Range   Magnesium 1.5 (L) 1.7 - 2.4 mg/dL    Comment: Performed at [facility], [address] [place], [place] [address]  Brain natriuretic peptide     Status: Abnormal   Collection Time: 12/10/23  4:49 AM  Result Value Ref Range   B Natriuretic Peptide 154.1 (H) 0.0 - 100.0 pg/mL    Comment: Performed at [facility], [address], [place], [place] [address]  C-reactive protein     Status: Abnormal   Collection Time: 12/10/23  4:49 AM  Result Value Ref Range   CRP 3.6 (H) <1.0 mg/dL    Comment: Performed at [facility], [address], [place], [place] [address]  Hepatitis B surface antigen     Status: None   Collection Time: 12/10/23  4:49 AM  Result Value Ref Range   Hepatitis B Surface Ag NON REACTIVE NON REACTIVE    Comment: Performed at [facility], [address] [place], [place] [address]  CBC with Differential/Platelet     Status: Abnormal   Collection Time: 12/11/23  3:14 AM  Result Value Ref Range   WBC 9.1 4.0 - 10.5 K/uL   RBC 3.70 (L) 4.22 - 5.81 MIL/uL   Hemoglobin 10.5 (L) 13.0 - 17.0 g/dL   HCT 40.1 (L) 02.7 - 25.3 %   MCV 86.8 80.0 - 100.0 fL   MCH 28.4 26.0 - 34.0 pg   MCHC 32.7 30.0 - 36.0 g/dL   RDW 66.4 (H) 40.3 - 47.4 %   Platelets 451 (H) 150 - 400 K/uL   nRBC 0.0 0.0 - 0.2 %   Neutrophils Relative % 84 %   Neutro Abs 7.7 1.7 - 7.7 K/uL   Lymphocytes Relative 9 %   Lymphs Abs 0.9 0.7 - 4.0 K/uL   Monocytes Relative 5 %   Monocytes Absolute 0.4 0.1 - 1.0 K/uL   Eosinophils Relative 1 %   Eosinophils Absolute 0.1 0.0 - 0.5 K/uL   Basophils Relative 0 %   Basophils Absolute 0.0 0.0 - 0.1 K/uL   Immature Granulocytes 1 %   Abs  Immature Granulocytes 0.06 0.00 - 0.07 K/uL    Comment: Performed at [facility], [address], [place], [place] [address]  Comprehensive metabolic panel     Status: Abnormal   Collection Time: 12/11/23  3:14 AM  Result Value Ref Range    Sodium 128 (L) 135 - 145 mmol/L   Potassium 3.8 3.5 - 5.1 mmol/L   Chloride 92 (L) 98 - 111 mmol/L   CO2 26 22 - 32 mmol/L   Glucose, Bld 98 70 - 99 mg/dL    Comment: Glucose reference range applies only to samples taken after fasting for at least 8 hours.   BUN 30 (H) 8 - 23 mg/dL   Creatinine, Ser 6.04 (H) 0.61 - 1.24 mg/dL   Calcium 8.8 (L) 8.9 - 10.3 mg/dL   Total Protein 7.6 6.5 - 8.1 g/dL   Albumin 2.0 (L) 3.5 - 5.0 g/dL   AST 19 15 - 41 U/L   ALT 19 0 - 44 U/L   Alkaline Phosphatase 66 38 - 126 U/L   Total Bilirubin 0.8 0.0 - 1.2 mg/dL   GFR, Estimated >54 >09 mL/min    Comment: (NOTE) Calculated using the CKD-EPI Creatinine Equation (2021)    Anion gap 10 5 - 15    Comment: Performed at [facility], [address], [place], [place] [address]  Magnesium     Status: None   Collection Time: 12/11/23  3:14 AM  Result Value Ref Range   Magnesium 2.4 1.7 - 2.4 mg/dL    Comment: Performed at [facility], [address], [place], [place] [address]  Brain natriuretic peptide     Status: Abnormal   Collection Time: 12/11/23  3:14 AM  Result Value Ref Range   B Natriuretic Peptide 236.1 (H) 0.0 - 100.0 pg/mL    Comment: Performed at [facility], [address], [place], [place] [address]  C-reactive protein     Status: Abnormal   Collection Time: 12/11/23  3:14 AM  Result Value Ref Range   CRP 4.4 (H) <1.0 mg/dL    Comment: Performed at [facility], [address] [place], [place] [address]    MR HIP RIGHT WO CONTRAST Result Date: 12/10/2023 CLINICAL DATA:  Septic arthritis. 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 Acute on chronic right hip pain. MRSA bacteremia. Acute on chronic right hip pain. EXAM: MR OF THE RIGHT HIP WITHOUT CONTRAST TECHNIQUE: Multiplanar, multisequence MR imaging was performed. No intravenous contrast was administered. The technologist reports the study was limited due to patient's evaluate tolerate the exam protocol.  Best images possible were obtained within the limitations of patient motion and metallic artifact. The patient refused to continue for postcontrast portion of the study. COMPARISON:  CT abdomen and pelvis 12/06/2023 FINDINGS: Despite efforts by the technologist and patient, mild to moderate motion artifact is present on today's exam and could not be eliminated. This reduces exam sensitivity and specificity. Bones/joints/cartilage: There is metallic susceptibility artifact from total right hip arthroplasty. 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 There is decreased T1 and increased T2 signal fluid inferior to the right femoral neck prosthesis, suggesting a complex joint effusion with mild-to-moderate joint capsule thickening. This fluid extends into the right obturator externus muscle, measuring  up to 4.6 x 2.3 x 2.6 cm (transverse by AP by craniocaudal; axial series 6, image 41 and coronal series 10, image 9). There is also a thin connection of this fluid extending to mild fluid within the right iliopsoas bursa (axial series 6 images 13 through 33). Overall, this fluid appears to represent complex joint fluid extending into the right iliopsoas bursa. Within the limitations of metallic artifact, there also may be joint fluid extending inferior to the right femoral prosthetic head-neck junction (axial series 6, image 21 and coronal series 9, image 20). There also appears to be posterior extension of joint fluid (axial series 6, image 24) into a fluid collection wrapping around the posterior aspect of the right greater trochanter (axial series 6, image 23 and coronal series 10, image 28), measuring up to approximately 5.0 x 1.9 x 3.2 cm (transverse by AP by craniocaudal). There are mild  septations within this fluid, and there is additional adjacent septated fluid extending more superiorly, into the right gluteus medius muscle (axial series 6, image 9 and coronal series 10, image 23) measuring up tor 4.7 x 2.3 x 2.0 cm (transverse by AP by craniocaudal. This is suspicious for infected joint fluid and developing abscesses. Muscles and tendons Muscles and tendons: Complex joint fluid extending into the right obturator externus muscle as above. Moderate right gluteus minimus and medius muscle edema. Focal edema within the anterior aspect of the left gluteus medius muscle (axial series 6, image 1). Other findings Miscellaneous: There is metallic susceptibility artifact in the region of the posterior right hip/buttock (axial series 6, image 11 and coronal series 9, image 6). Recommend clinical correlation for possible surgical skin staples in this region. IMPRESSION: 1. Status post total right hip arthroplasty. 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 2. There is complex joint fluid inferior to the right femoral neck prosthesis with mild-to-moderate joint capsule thickening. This fluid extends into the right obturator externus muscle and into the right iliopsoas bursa. There also appears to be posterior extension of joint fluid into a fluid collection wrapping around the posterior aspect of the right greater trochanter and extending superiorly into the right gluteus medius muscle. This is suspicious for right humerus however septic arthritis with the infected joint fluid extending peripherally. 3. Moderate right gluteus minimus and medius muscle edema. Focal edema within the anterior aspect of the left gluteus medius muscle. 4. There is metallic susceptibility artifact in the region of the  posterior right hip/buttock. Recommend clinical correlation for possible surgical skin staples in this region. Electronically Signed   By: Neita Garnet M.D.   On: 12/10/2023 14:35   MR CERVICAL SPINE WO CONTRAST Result Date: 12/10/2023 CLINICAL DATA:  Neck pain, infection suspected, no prior imaging EXAM: MRI CERVICAL SPINE WITHOUT CONTRAST TECHNIQUE: Multiplanar, multisequence MR imaging of the cervical spine was performed. No intravenous contrast was administered. COMPARISON:  None Available. FINDINGS: Severely motion limited study. Postcontrast imaging was performed due to patient intolerance. Within this limitation: Alignment: No substantial sagittal subluxation. Vertebrae: Limited evaluation due to motion. Significant edema surrounding the left C2-C3 facet joint and extending to involve the posterior left C2 and C3 vertebral bodies. Cord: Poorly evaluated due to motion. No obvious cord signal abnormality. Posterior Fossa, vertebral arteries, paraspinal tissues: Paraspinal edema surrounding the left C2-C3 facet joint. Disc levels: No  significant canal stenosis. Nondiagnostic evaluation of the foramina due to severe motion on axial sequences. IMPRESSION: Severely motion limited noncontrast study. Significant marrow edema surrounding the left C2-C3 facet joint and extending to involve the posterior left C2 and C3 vertebral bodies with surrounding paraspinal edema, concerning for septic arthritis and possibly discitis/osteomyelitis given the reported clinical concern for infection. A repeat MRI of the cervical spine with contrast is recommended when the patient is able (possibly with sedation) to better evaluate. These results will be called to the ordering clinician or representative by the Radiologist Assistant, and communication documented in the PACS or [employer]. Electronically Signed   By: Feliberto Harts M.D.   On: 12/10/2023 02:10   MR BRAIN WO CONTRAST Result Date: 12/10/2023 CLINICAL DATA:   Brain abscess EXAM: MRI HEAD WITHOUT CONTRAST TECHNIQUE: Multiplanar, multiecho pulse sequences of the brain and surrounding structures were obtained without intravenous contrast. COMPARISON:  None Available. FINDINGS: Brain: No acute infarction, hemorrhage, hydrocephalus, extra-axial collection or mass lesion. Vascular: Major arterial flow voids are maintained at the skull base. Skull and upper cervical spine: Normal marrow signal. Sinuses/Orbits: Clear sinuses.  No acute orbital findings. Other: No mastoid effusions. IMPRESSION: No evidence of acute intracranial abnormality. Electronically Signed   By: Feliberto Harts M.D.   On: 12/10/2023 02:03   ECHOCARDIOGRAM COMPLETE Result Date: 12/09/2023    ECHOCARDIOGRAM REPORT   Patient Name:   [name] [name] Date of Exam: 12/09/2023 Medical Rec #:  [id number]    Height:       73.0 in Accession #:    [id number]   Weight:       124.1 lb Date of Birth:  03/16/1961    BSA:          1.757 m Patient Age:    63 years     BP:           123/81 mmHg Patient Gender: M            HR:           80 bpm. Exam Location:  Inpatient Procedure: 2D Echo, Cardiac Doppler, Color Doppler and Intracardiac            Opacification Agent (Both Spectral and Color Flow Doppler were            utilized during procedure). Indications:    Bacteremia  History:        Patient has no prior history of Echocardiogram examinations.                 CHF, COPD; Risk Factors:IVDA.  Sonographer:    Amy Chionchio Referring Phys: [id number] Ellsworth Lennox IMPRESSIONS  1. Left ventricular ejection fraction, by estimation, is 20 to 25%. The left ventricle has severely decreased function. The left ventricle demonstrates global hypokinesis. The left ventricular internal cavity size was mildly dilated. Left ventricular diastolic parameters are consistent with Grade I diastolic dysfunction (impaired relaxation). No LV thrombus noted.  2. Right ventricular systolic function is mildly reduced. The right ventricular  size is normal. Tricuspid regurgitation signal is inadequate for assessing PA pressure.  3. The mitral valve is normal in structure. No evidence of mitral valve regurgitation. No evidence of mitral stenosis. Moderate mitral annular calcification.  4. The aortic valve was not well visualized. Aortic valve regurgitation is not visualized. No aortic stenosis is present.  5. IVC not visualized. FINDINGS  Left Ventricle: Left ventricular ejection fraction, by estimation, is 20 to 25%. The left ventricle has severely  decreased function. The left ventricle demonstrates global hypokinesis. Definity contrast agent was given IV to delineate the left ventricular endocardial borders. The left ventricular internal cavity size was mildly dilated. There is no left ventricular hypertrophy. Left ventricular diastolic parameters are consistent with Grade I diastolic dysfunction (impaired relaxation). Right Ventricle: The right ventricular size is normal. No increase in right ventricular wall thickness. Right ventricular systolic function is mildly reduced. Tricuspid regurgitation signal is inadequate for assessing PA pressure. Left Atrium: Left atrial size was normal in size. Right Atrium: Right atrial size was normal in size. Pericardium: There is no evidence of pericardial effusion. Mitral Valve: The mitral valve is normal in structure. Moderate mitral annular calcification. No evidence of mitral valve regurgitation. No evidence of mitral valve stenosis. MV peak gradient, 2.4 mmHg. The mean mitral valve gradient is 1.0 mmHg. Tricuspid Valve: The tricuspid valve is normal in structure. Tricuspid valve regurgitation is not demonstrated. Aortic Valve: The aortic valve was not well visualized. Aortic valve regurgitation is not visualized. No aortic stenosis is present. Aortic valve mean gradient measures 3.0 mmHg. Aortic valve peak gradient measures 4.7 mmHg. Aortic valve area, by VTI measures 2.31 cm. Pulmonic Valve: The pulmonic  valve was normal in structure. Pulmonic valve regurgitation is not visualized. Aorta: The aortic root is normal in size and structure. Venous: IVC not visualized. The inferior vena cava was not well visualized. IAS/Shunts: No atrial level shunt detected by color flow Doppler.  LEFT VENTRICLE PLAX 2D LVOT diam:     2.20 cm   Diastology LV SV:         33        LV e' medial:    5.00 cm/s LV SV Index:   19        LV E/e' medial:  9.1 LVOT Area:     3.80 cm  LV e' lateral:   5.87 cm/s                          LV E/e' lateral: 7.7  RIGHT VENTRICLE TAPSE (M-mode): 1.7 cm AORTIC VALVE AV Area (Vmax):    2.37 cm AV Area (Vmean):   2.15 cm AV Area (VTI):     2.31 cm AV Vmax:           108.00 cm/s AV Vmean:          78.600 cm/s AV VTI:            0.141 m AV Peak Grad:      4.7 mmHg AV Mean Grad:      3.0 mmHg LVOT Vmax:         67.20 cm/s LVOT Vmean:        44.500 cm/s LVOT VTI:          0.086 m LVOT/AV VTI ratio: 0.61 MITRAL VALVE MV Area (PHT): 2.96 cm    SHUNTS MV Area VTI:   2.19 cm    Systemic VTI:  0.09 m MV Peak grad:  2.4 mmHg    Systemic Diam: 2.20 cm MV Mean grad:  1.0 mmHg MV Vmax:       0.78 m/s MV Vmean:      46.7 cm/s MV Decel Time: 256 msec MV E velocity: 45.40 cm/s MV A velocity: 67.70 cm/s MV E/A ratio:  0.67 Dalton McleanMD Electronically signed by Wilfred Lacy Signature Date/Time: 12/09/2023/1:22:54 PM    Final     Review of Systems  HENT:  Negative for  ear discharge, ear pain, hearing loss and tinnitus.   Eyes:  Negative for photophobia and pain.  Respiratory:  Negative for cough and shortness of breath.   Cardiovascular:  Negative for chest pain.  Gastrointestinal:  Negative for abdominal pain, nausea and vomiting.  Genitourinary:  Negative for dysuria, flank pain, frequency and urgency.  Musculoskeletal:  Positive for arthralgias (Right hip) and back pain. Negative for myalgias and neck pain.  Neurological:  Negative for dizziness and headaches.  Hematological:  Does not bruise/bleed  easily.  Psychiatric/Behavioral:  The patient is not nervous/anxious.    Blood pressure 119/79, pulse 90, temperature (!) 97.2 F (36.2 C), temperature source Oral, resp. rate 17, height 6\' 1"  (1.854 m), weight 56.4 kg, SpO2 92%. Physical Exam Constitutional:      General: He is not in acute distress.    Appearance: He is well-developed. He is not diaphoretic.  HENT:     Head: Normocephalic and atraumatic.  Eyes:     General: No scleral icterus.       Right eye: No discharge.        Left eye: No discharge.     Conjunctiva/sclera: Conjunctivae normal.  Cardiovascular:     Rate and Rhythm: Normal rate and regular rhythm.  Pulmonary:     Effort: Pulmonary effort is normal. No respiratory distress.  Musculoskeletal:     Cervical back: Normal range of motion.     Comments: RLE No traumatic wounds, ecchymosis, or rash  Nontender, mod pain with AROM/PROM, esp int rot  No knee or ankle effusion  Knee stable to varus/ valgus and anterior/posterior stress  Sens DPN, SPN, TN intact  Motor EHL, ext, flex, evers 5/5  DP 2+, PT 2+, No significant edema  Skin:    General: Skin is warm and dry.  Neurological:     Mental Status: He is alert.  Psychiatric:        Mood and Affect: Mood normal.        Behavior: Behavior normal.     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 Will ask IR to tap for confirmation.    Freeman Caldron, PA-C Orthopedic Surgery [phone] 12/11/2023, 10:10 AM

## 2023-12-11 NOTE — Progress Notes (Signed)
 IR request for right hip aspiration. Aspiration was approved by Dr. Charmaine Downs with ultrasound guidance.   Patient was brought down to the ultrasound suite.  After discussions of risks, benefits and alternatives, patient ultimately declined intervention today.   Patient was informed of risks such as worsening infection, bacteremia, further hip damage, end organ damage and the possibility  of death. Patient expressed understanding.   Patient informed to contact his primary team if he wishes to proceed.   Ordering provider informed about patient's decision.    [name] Leedy PA-C

## 2023-12-12 DIAGNOSIS — B9562 Methicillin resistant Staphylococcus aureus infection as the cause of diseases classified elsewhere: Secondary | ICD-10-CM | POA: Diagnosis not present

## 2023-12-12 DIAGNOSIS — E871 Hypo-osmolality and hyponatremia: Secondary | ICD-10-CM | POA: Diagnosis not present

## 2023-12-12 DIAGNOSIS — R7881 Bacteremia: Secondary | ICD-10-CM | POA: Diagnosis not present

## 2023-12-12 DIAGNOSIS — I5042 Chronic combined systolic (congestive) and diastolic (congestive) heart failure: Secondary | ICD-10-CM | POA: Diagnosis not present

## 2023-12-12 LAB — C-REACTIVE PROTEIN: CRP: 2.9 mg/dL — ABNORMAL HIGH (ref <1.0)

## 2023-12-12 LAB — COMPREHENSIVE METABOLIC PANEL WITH GFR
ALT: 16 U/L (ref 0–44)
AST: 20 U/L (ref 15–41)
Albumin: 1.9 g/dL — ABNORMAL LOW (ref 3.5–5.0)
Alkaline Phosphatase: 63 U/L (ref 38–126)
Anion gap: 10 (ref 5–15)
BUN: 27 mg/dL — ABNORMAL HIGH (ref 8–23)
CO2: 26 mmol/L (ref 22–32)
Calcium: 8.4 mg/dL — ABNORMAL LOW (ref 8.9–10.3)
Chloride: 89 mmol/L — ABNORMAL LOW (ref 98–111)
Creatinine, Ser: 0.98 mg/dL (ref 0.61–1.24)
GFR, Estimated: >60 mL/min (ref >60)
Glucose, Bld: 99 mg/dL (ref 70–99)
Potassium: 3.6 mmol/L (ref 3.5–5.1)
Sodium: 125 mmol/L — ABNORMAL LOW (ref 135–145)
Total Bilirubin: 0.9 mg/dL (ref 0.0–1.2)
Total Protein: 7 g/dL (ref 6.5–8.1)

## 2023-12-12 LAB — CBC WITH DIFFERENTIAL/PLATELET
Abs Immature Granulocytes: 0.03 10*3/uL (ref 0.00–0.07)
Basophils Absolute: 0 10*3/uL (ref 0.0–0.1)
Basophils Relative: 0 %
Eosinophils Absolute: 0.2 10*3/uL (ref 0.0–0.5)
Eosinophils Relative: 3 %
HCT: 28 % — ABNORMAL LOW (ref 39.0–52.0)
Hemoglobin: 9.2 g/dL — ABNORMAL LOW (ref 13.0–17.0)
Immature Granulocytes: 0 %
Lymphocytes Relative: 12 %
Lymphs Abs: 0.8 10*3/uL (ref 0.7–4.0)
MCH: 28.6 pg (ref 26.0–34.0)
MCHC: 32.9 g/dL (ref 30.0–36.0)
MCV: 87 fL (ref 80.0–100.0)
Monocytes Absolute: 0.6 10*3/uL (ref 0.1–1.0)
Monocytes Relative: 8 %
Neutro Abs: 5.5 10*3/uL (ref 1.7–7.7)
Neutrophils Relative %: 77 %
Platelets: 353 10*3/uL (ref 150–400)
RBC: 3.22 MIL/uL — ABNORMAL LOW (ref 4.22–5.81)
RDW: 16 % — ABNORMAL HIGH (ref 11.5–15.5)
WBC: 7.2 10*3/uL (ref 4.0–10.5)
nRBC: 0 % (ref 0.0–0.2)

## 2023-12-12 LAB — HEPATITIS B CORE ANTIBODY, TOTAL: HEP B CORE AB: POSITIVE — AB

## 2023-12-12 LAB — BRAIN NATRIURETIC PEPTIDE: B Natriuretic Peptide: 178 pg/mL — ABNORMAL HIGH (ref 0.0–100.0)

## 2023-12-12 LAB — HEPATITIS B SURFACE ANTIBODY, QUANTITATIVE: Hep B S AB Quant (Post): 3959 m[IU]/mL

## 2023-12-12 LAB — MAGNESIUM: Magnesium: 1.7 mg/dL (ref 1.7–2.4)

## 2023-12-12 NOTE — Plan of Care (Signed)

## 2023-12-12 NOTE — [place] FL2 (Signed)
 [place] MEDICAID FL2 LEVEL OF CARE FORM     IDENTIFICATION  Patient Name: [name] [name] Birthdate: [birth year] Sex: male Admission Date (Current Location): 12/06/2023  [facility] and [place] Number:  Producer, television/film/video and Address:  [facility] [place], [address] [place], [place] [address]      Provider Number: [id number]  Attending Physician Name and Address:  Leroy Sea, MD  Relative Name and Phone Number:       Current Level of Care: Hospital Recommended Level of Care: Skilled Nursing Facility Prior Approval Number:    Date Approved/Denied:   PASRR Number: pending  Discharge Plan: SNF    Current Diagnoses: Patient Active Problem List   Diagnosis Date Noted   Hyponatremia 12/12/2023   Protein-calorie malnutrition, severe 12/09/2023   Acute midline back pain 12/08/2023   Compression of lumbar vertebra (HCC) 12/08/2023   Chronic multifocal osteomyelitis of right femur (HCC) 12/08/2023   Neck pain 12/08/2023   Septic embolism (HCC) 12/08/2023   Cachexia (HCC) 12/08/2023   Homeless 12/08/2023   IVDU (intravenous drug user) 12/08/2023   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   Nonintractable headache 12/08/2023   Coronary artery disease involving native coronary artery of native heart 12/08/2023   Chronic combined systolic and diastolic heart failure (HCC) 12/08/2023   Ischemic cardiomyopathy 12/08/2023   Chronic obstructive pulmonary disease (HCC) 12/08/2023   Pulmonary emphysema (HCC) 12/08/2023   Cigarette smoker 12/08/2023   Compression fracture of body of thoracic vertebra (HCC) 12/08/2023   Hx of deep venous thrombosis 12/08/2023   Hx pulmonary embolism 12/08/2023   Protein-calorie malnutrition (HCC) 12/08/2023   MRSA bacteremia 12/07/2023   Major depressive disorder, recurrent episode, moderate (HCC)    Substance abuse (HCC)    Suicidal ideation    Sprained ankle 10/28/2014   Alcohol abuse with alcohol-induced mood  disorder (HCC) 10/26/2014   Substance or medication-induced bipolar and related disorder with onset during intoxication (HCC) 10/26/2014   Alcohol use disorder, severe, dependence (HCC) 10/26/2014   Cannabis use disorder, severe, dependence (HCC) 10/26/2014   Cannabis abuse with cannabis-induced disorder (HCC) 04/11/2014   Gastrointestinal bleeding, lower 02/14/2014    Orientation RESPIRATION BLADDER Height & Weight     Self, Time, Situation, Place  Normal Continent Weight: 120 lb 2.4 oz (54.5 kg) Height:  6\' 1"  (185.4 cm)  BEHAVIORAL SYMPTOMS/MOOD NEUROLOGICAL BOWEL NUTRITION STATUS      Continent Diet (See dc summary)  AMBULATORY STATUS COMMUNICATION OF NEEDS Skin   Limited Assist Verbally Other (Comment) (Wound on sacrum;)                       Personal Care Assistance Level of Assistance  Bathing, Feeding, Dressing Bathing Assistance: Limited assistance Feeding assistance: Independent Dressing Assistance: Limited assistance     Functional Limitations Info             SPECIAL CARE FACTORS FREQUENCY  PT (By licensed PT), OT (By licensed OT)     PT Frequency: Eval and treat OT Frequency: Eval and treat            Contractures Contractures Info: Not present    Additional Factors Info  Code Status, Allergies Code Status Info: Full Allergies Info: Fentanyl, Hydromorphone Hcl, Morphine, Pantoprazole, Pantoprazole Sodium, Haloperidol, Nsaids, Morphine And Codeine, Ibuprofen, Ketorolac, Pantoprazole Sodium           Current Medications (12/12/2023):  This is the current hospital active medication list Current Facility-Administered Medications  Medication Dose Route Frequency Provider Last Rate Last Admin   acetaminophen (TYLENOL) tablet 650 mg  650 mg Oral Q6H PRN Darlin Drop, DO   650 mg at 12/08/23 2106   apixaban (ELIQUIS) tablet 5 mg  5 mg Oral BID Dow Adolph N, DO   5 mg at 12/12/23 1610   atorvastatin (LIPITOR) tablet 80 mg  80 mg Oral QHS Leroy Sea, MD   80 mg at 12/11/23 2142   carvedilol (COREG) tablet 3.125 mg  3.125 mg Oral BID WC Little Ishikawa, MD   3.125 mg at 12/12/23 9604   Chlorhexidine Gluconate Cloth 2 % PADS 6 each  6 each Topical Daily Leroy Sea, MD   6 each at 12/12/23 0837   famotidine (PEPCID) tablet 20 mg  20 mg Oral Daily Leroy Sea, MD   20 mg at 12/12/23 0834   feeding supplement (ENSURE ENLIVE / ENSURE PLUS) liquid 237 mL  237 mL Oral BID BM Leroy Sea, MD   237 mL at 12/12/23 0836   HYDROmorphone (DILAUDID) injection 0.5 mg  0.5 mg Intravenous Q4H PRN Leroy Sea, MD   0.5 mg at 12/12/23 1035   melatonin tablet 5 mg  5 mg Oral QHS PRN Dow Adolph N, DO   5 mg at 12/10/23 2144   multivitamin with minerals tablet 1 tablet  1 tablet Oral Daily Leroy Sea, MD   1 tablet at 12/12/23 0834   naloxone [facility]) injection 0.4 mg  0.4 mg Intravenous PRN Howerter, Justin B, DO       oxyCODONE (Oxy IR/ROXICODONE) immediate release tablet 10 mg  10 mg Oral Q4H PRN Leroy Sea, MD   10 mg at 12/12/23 1240   polyethylene glycol (MIRALAX / GLYCOLAX) packet 17 g  17 g Oral Daily PRN Dow Adolph N, DO       prochlorperazine (COMPAZINE) injection 5 mg  5 mg Intravenous Q6H PRN Hall, Carole N, DO       saccharomyces boulardii (FLORASTOR) capsule 250 mg  250 mg Oral BID Leroy Sea, MD   250 mg at 12/12/23 5409   sodium chloride flush (NS) 0.9 % injection 10-40 mL  10-40 mL Intracatheter PRN Leroy Sea, MD       spironolactone (ALDACTONE) tablet 25 mg  25 mg Oral Daily Leroy Sea, MD   25 mg at 12/12/23 0835   vancomycin (VANCOCIN) IVPB 1000 mg/200 mL premix  1,000 mg Intravenous Q24H Reome, Earle J, RPH 200 mL/hr at 12/12/23 1240 1,000 mg at 12/12/23 1240     Discharge Medications: Please see discharge summary for a list of discharge medications.  Relevant Imaging Results:  Relevant Lab Results:   Additional Information SSN: [id number]. IV  Vancyomycin  Mearl Latin, LCSW

## 2023-12-12 NOTE — Progress Notes (Signed)
 Rounding Note    Patient Name: [name] [name] Date of Encounter: 12/12/2023  [facility] Cardiologist: None   Subjective   BP 107/68.  Creatinine improved to 0.98.  Complains of back pain this morning  Inpatient Medications    Scheduled Meds:  apixaban  5 mg Oral BID   atorvastatin  80 mg Oral QHS   carvedilol  3.125 mg Oral BID WC   Chlorhexidine Gluconate Cloth  6 each Topical Daily   famotidine  20 mg Oral Daily   feeding supplement  237 mL Oral BID BM   multivitamin with minerals  1 tablet Oral Daily   saccharomyces boulardii  250 mg Oral BID   spironolactone  25 mg Oral Daily   Continuous Infusions:  vancomycin 1,000 mg (12/11/23 1218)   PRN Meds: acetaminophen, HYDROmorphone (DILAUDID) injection, melatonin, naLOXone (NARCAN)  injection, oxyCODONE, polyethylene glycol, prochlorperazine, sodium chloride flush   Vital Signs    Vitals:   12/11/23 1953 12/11/23 2357 12/12/23 0444 12/12/23 0800  BP: 106/75 (!) 116/96 119/84 107/68  Pulse:      Resp: 16 17 19 18   Temp: 98.7 F (37.1 C) 98.6 F (37 C) 98.2 F (36.8 C) 98.2 F (36.8 C)  TempSrc: Oral Oral Oral   SpO2:    98%  Weight: 54.5 kg     Height:       No intake or output data in the 24 hours ending 12/12/23 1026     12/11/2023    7:53 PM 12/10/2023   11:27 PM 12/10/2023    7:01 AM  Last 3 Weights  Weight (lbs) 120 lb 2.4 oz 124 lb 5.4 oz 128 lb 12 oz  Weight (kg) 54.5 kg 56.4 kg 58.4 kg      Telemetry    NSR - Personally Reviewed  ECG    No new ECG - Personally Reviewed  Physical Exam   GEN: Cachectic, no acute distress  Neck: No JVD Cardiac: RRR, no murmurs, rubs, or gallops.  Respiratory: Clear to auscultation bilaterally. GI: Soft, nontender, non-distended  MS: No edema; No deformity. Neuro:  Nonfocal  Psych: Normal affect   Labs    High Sensitivity Troponin:   Recent Labs  Lab 12/06/23 2028 12/06/23 2203  TROPONINIHS 13 14     Chemistry Recent Labs  Lab  12/10/23 0449 12/11/23 0314 12/12/23 0330  NA 128* 128* 125*  K 3.8 3.8 3.6  CL 92* 92* 89*  CO2 26 26 26   GLUCOSE 100* 98 99  BUN 20 30* 27*  CREATININE 1.18 1.27* 0.98  CALCIUM 8.8* 8.8* 8.4*  MG 1.5* 2.4 1.7  PROT 7.5 7.6 7.0  ALBUMIN 2.1* 2.0* 1.9*  AST 26 19 20   ALT 23 19 16   ALKPHOS 58 66 63  BILITOT 0.5 0.8 0.9  GFRNONAA >60 >60 >60  ANIONGAP 10 10 10     Lipids  Recent Labs  Lab 12/09/23 0533  CHOL 114  TRIG 59  HDL 43  LDLCALC 59  CHOLHDL 2.7    Hematology Recent Labs  Lab 12/10/23 0449 12/11/23 0314 12/12/23 0330  WBC 6.8 9.1 7.2  RBC 3.38* 3.70* 3.22*  HGB 9.7* 10.5* 9.2*  HCT 29.5* 32.1* 28.0*  MCV 87.3 86.8 87.0  MCH 28.7 28.4 28.6  MCHC 32.9 32.7 32.9  RDW 16.1* 16.2* 16.0*  PLT 392 451* 353   Thyroid No results for input(s): "TSH", "FREET4" in the last 168 hours.  BNP Recent Labs  Lab 12/10/23 0449 12/11/23 1610  12/12/23 0330  BNP 154.1* 236.1* 178.0*    DDimer No results for input(s): "DDIMER" in the last 168 hours.   Radiology    DG Pelvis 1-2 Views Result Date: 12/11/2023 CLINICAL DATA:  History of total hip arthroplasty. EXAM: PELVIS - 1-2 VIEW COMPARISON:  CT 12/06/2023 FINDINGS: Right hip arthroplasty. 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 The heterotopic calcification about the right hemipelvis is not as well demonstrated on the current exam. The acetabular thinning is better demonstrated on recent CT. Distal most femoral stem is not entirely included in the field of view. The bones are subjectively under mineralized IMPRESSION: Right hip arthroplasty with acetabular protrusion, similar to recent abdominal CT. Electronically Signed   By: Narda Rutherford M.D.   On: 12/11/2023 16:37   MR HIP RIGHT WO CONTRAST Result Date: 12/10/2023 CLINICAL DATA:  Septic arthritis. 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 Acute on chronic right hip pain. MRSA bacteremia. Acute on chronic right hip pain. EXAM: MR OF THE RIGHT  HIP WITHOUT CONTRAST TECHNIQUE: Multiplanar, multisequence MR imaging was performed. No intravenous contrast was administered. The technologist reports the study was limited due to patient's evaluate tolerate the exam protocol. Best images possible were obtained within the limitations of patient motion and metallic artifact. The patient refused to continue for postcontrast portion of the study. COMPARISON:  CT abdomen and pelvis 12/06/2023 FINDINGS: Despite efforts by the technologist and patient, mild to moderate motion artifact is present on today's exam and could not be eliminated. This reduces exam sensitivity and specificity. Bones/joints/cartilage: There is metallic susceptibility artifact from total right hip arthroplasty. 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 There is decreased T1 and increased T2 signal fluid inferior to the right femoral neck prosthesis, suggesting a complex joint effusion with mild-to-moderate joint capsule thickening. This fluid extends into the right obturator externus muscle, measuring up to 4.6 x 2.3 x 2.6 cm (transverse by AP by craniocaudal; axial series 6, image 41 and coronal series 10, image 9). There is also a thin connection of this fluid extending to mild fluid within the right iliopsoas bursa (axial series 6 images 13 through 33). Overall, this fluid appears to represent complex joint fluid extending into the right iliopsoas bursa. Within the limitations of metallic artifact, there also may be joint fluid extending inferior to the right femoral prosthetic head-neck junction (axial series 6, image 21 and coronal series 9, image 20). There also appears to be posterior extension of joint fluid (axial series 6, image 24) into a fluid collection  wrapping around the posterior aspect of the right greater trochanter (axial series 6, image 23 and coronal series 10, image 28), measuring up to approximately 5.0 x 1.9 x 3.2 cm (transverse by AP by craniocaudal). There are mild septations within this fluid, and there is additional adjacent septated fluid extending more superiorly, into the right gluteus medius muscle (axial series 6, image 9 and coronal series 10, image 23) measuring up tor 4.7 x 2.3 x 2.0 cm (transverse by AP by craniocaudal. This is suspicious for infected joint fluid and developing abscesses. Muscles and tendons Muscles and tendons: Complex joint fluid extending into the right obturator externus muscle as above. Moderate right gluteus minimus and medius muscle edema. Focal edema within the anterior aspect of the left gluteus  medius muscle (axial series 6, image 1). Other findings Miscellaneous: There is metallic susceptibility artifact in the region of the posterior right hip/buttock (axial series 6, image 11 and coronal series 9, image 6). Recommend clinical correlation for possible surgical skin staples in this region. IMPRESSION: 1. Status post total right hip arthroplasty. 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 2. There is complex joint fluid inferior to the right femoral neck prosthesis with mild-to-moderate joint capsule thickening. This fluid extends into the right obturator externus muscle and into the right iliopsoas bursa. There also appears to be posterior extension of joint fluid into a fluid collection wrapping around the posterior aspect of the right greater trochanter and extending superiorly into the right gluteus medius muscle. This is suspicious for right humerus however septic arthritis with the infected joint fluid  extending peripherally. 3. Moderate right gluteus minimus and medius muscle edema. Focal edema within the anterior aspect of the left gluteus medius muscle. 4. There is metallic susceptibility artifact in the region of the posterior right hip/buttock. Recommend clinical correlation for possible surgical skin staples in this region. Electronically Signed   By: Neita Garnet M.D.   On: 12/10/2023 14:35    Cardiac Studies   TTE 12/09/23:  1. Left ventricular ejection fraction, by estimation, is 20 to 25%. The  left ventricle has severely decreased function. The left ventricle  demonstrates global hypokinesis. The left ventricular internal cavity size  was mildly dilated. Left ventricular  diastolic parameters are consistent with Grade I diastolic dysfunction  (impaired relaxation). No LV thrombus noted.   2. Right ventricular systolic function is mildly reduced. The right  ventricular size is normal. Tricuspid regurgitation signal is inadequate  for assessing PA pressure.   3. The mitral valve is normal in structure. No evidence of mitral valve  regurgitation. No evidence of mitral stenosis. Moderate mitral annular  calcification.   4. The aortic valve was not well visualized. Aortic valve regurgitation  is not visualized. No aortic stenosis is present.   5. IVC not visualized.   Patient Profile     63 y.o. male with a hx of chronic HFrEF (EF 40% in 12/2019, 20-25% in 04/2021, 15-20% in 05/2021 and 09/2021, 20-25% in 09/2021, < 15% in 02/2022, 11/2022 and 06/2023), CAD (cath in 02/2022 showing multivessel CAD with 50% LMCA stenosis, 80% mid RCA stenosis, 80% D2 stenosis, 40% proximal LAD stenosis, 60% mid/distal LAD stenosis, 70% OM1 stenosis and 70% mid LCx stenosis --> CT surgery consulted and ultimately decision was for medical management given his ongoing drug use), COPD, HTN, HLD, history of PE/DVT, homelessness and ongoing polysubstance abuse (UDS in 10/2023 positive for amphetamines and  cocaine) who is being seen  for the evaluation of chronic HFrEF and need for TEE   Assessment & Plan    1. Chronic HFrEF - He has a longstanding cardiomyopathy dating back to at least 2021 and EF was previously less than 15% and read as being at 20 to 25% by most recent echocardiogram earlier this month.  Echo this admission with EF 20 to 25%.  He has been restarted on Spironolactone 25 mg daily, Torsemide 20 mg daily and Toprol-XL 25 mg daily.  Stop Toprol-XL and switch to carvedilol given cocaine use. Would be hesitant to start Entresto given variable BP and would not use  an SGLT2 inhibitor given his infection risk. Not a candidate for advanced therapies given ongoing substance abuse. Overall, his prognosis is very poor given his longstanding cardiomyopathy, advanced CAD, medication noncompliance, homelessness and drug use. -Torsemide held yesterday due to worsening renal function.  Cr improved today.  Appears euvolemic, will hold torsemide for now   2.  CAD - Prior cardiac catheterization 02/2022 showed multivessel CAD as outlined above, including 50% LMCA stenosis.  CT surgery was consulted at that time and recommended a cardiac MRI for viability but he declined and he was overall felt to be a poor surgical candidate given continued drug use and homelessness. - He has not been on ASA given the need for anticoagulation with Eliquis. Likely not on statin due to noncompliance. LDL 59   3. COPD - CT imaging has shown emphysema and chronic bronchitis. Not on inhalers or nebulizers routinely.   4.  MRSA bacteremia - ID consult is pending and recommended TEE.  He was evaluated on 3/23 and not felt to be candidate.  Repeat TTE this admission, no clear vegetation seen.  Antibiotics per ID  5. History of PE/DVT - He has been continued on Eliquis 5mg  BID for anticoagulation.    6. Hyponatremia - Appears to be chronic by review of labs from Care Everywhere. Na+ was at 130 on admission, downtrending to  126 3/23.  Currently 125, will monitor   For questions or updates, please contact [facility] Please consult [email or web address] for contact info under        Signed, Little Ishikawa, MD  12/12/2023, 10:26 AM

## 2023-12-12 NOTE — Progress Notes (Addendum)
 PROGRESS NOTE                                                                                                                                                                                                             Patient Demographics:    [name] [name], is a 63 y.o. male, DOB - [birth year], WUJ:[id number]  Outpatient Primary MD for the patient is Patient, No Pcp Per    LOS - 5  Admit date - 12/06/2023    Chief Complaint  Patient presents with   Abdominal Pain       Brief Narrative (HPI from H&P)    63 y.o. 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   He did not want to go back to [facility], he was also due for a TEE and was admitted to the hospital here for further care.   Subjective:   Patient in bed, appears comfortable, denies any headache, no fever, no chest pain or pressure, no shortness of breath , no abdominal pain. No focal weakness.  Improved mid back pain.   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  Being treated for MRSA bacteremia through IV vancomycin under the guidance of cardiology and ID.  Per neurosurgeon Dr. Conchita Paris C2-C3 osteomyelitis requires medical treatment only, orthopedics following him, IR tried to aspirate his right hip abscess and fluid collection but patient refused.  He has refused multiple tests and procedures this admission despite counseling.  For now continue IV Vancomycin, defer  course and duration of antibiotics to ID and orthopedics.  Of note patient was to get Dalbavancin prior to [facility] but appears that he never received it.    C2-C3 osteomyelitis, lower back pain, compression fractures in lumbar spine vertebrae, T12, L2-L3 L5 - repeat MRI L-spine this admission stable, supportive care.  No signs of acute infection, C-spine MRI changes discussed with Dr. Conchita Paris, medical treatment.  Supportive care for brain, history of narcotic seeking behavior.   Lactic acidosis - due to dehydration.  Has been hydrated.   Chronic HFrEF - LVEF 20 to 25%, cardiology following.  Continue on Aldactone further diuretics per cardiology, euvolemic currently.   Possible acute pyelonephritis seen on CT scan, POA  - Continue IV antibiotics, As needed analgesics, follow urine culture.   History of PE/DVT on Eliquis  Resume home Eliquis also has IVC filter.   History of polysubstance abuse  Denies recent use of IV drugs   Tobacco use disorder  - Current smoker, Nicotine patch   Generalized weakness  PT OT assessment, Fall precautions   Severe protein calorie malnutrition - Dietitian consult, protein supplementation  Hyponatremia.  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  No acute issues.  Monitor  No IV access.  PICC line placed 12/09/2023 after much counseling       Condition - Extremely Guarded  Family Communication  :   None present  Code Status :   Full  Consults  :  ID, Cards, Dr. Conchita Paris Neurosurgery, orthopedics, IR  PUD Prophylaxis :  Pepcid   Procedures  :     PICC line placed 12/09/2023   CT R Hip - 1. Status post total right hip arthroplasty. 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 2. There is complex joint fluid inferior to the right femoral neck prosthesis with mild-to-moderate joint capsule thickening. This fluid extends into the right obturator externus muscle and into the right iliopsoas bursa. There also appears to be posterior extension of joint fluid into a fluid collection wrapping around the posterior aspect of the right greater trochanter and extending superiorly into the right gluteus medius muscle. This is suspicious for right humerus however septic arthritis with the infected joint fluid extending peripherally. 3. Moderate right gluteus minimus and medius muscle edema. Focal edema within the anterior aspect of the left gluteus medius muscle. 4. There is metallic susceptibility artifact in the region of the posterior right hip/buttock. Recommend clinical correlation for possible surgical skin staples in this region.   TTE - 1. Left ventricular ejection fraction, by estimation, is 20 to 25%. The left ventricle has severely decreased function. The left ventricle demonstrates global hypokinesis. The left ventricular internal cavity size was mildly dilated. Left ventricular diastolic parameters are consistent with Grade I diastolic dysfunction (impaired relaxation). No LV thrombus noted.  2. Right ventricular systolic function is mildly reduced. The right ventricular size is normal. Tricuspid regurgitation signal is inadequate for assessing PA pressure.  3. The mitral valve is normal in  structure. No evidence of mitral valve regurgitation. No evidence of mitral stenosis. Moderate mitral annular calcification.  4. The aortic valve was not well visualized. Aortic valve regurgitation is not visualized. No aortic stenosis is present.  5. IVC not visualized.  MRI brain.  Nonacute.    MRI C-spine.   Severely motion limited noncontrast study. Significant marrow edema surrounding the left C2-C3 facet joint and extending to involve the posterior left C2 and C3 vertebral bodies with surrounding paraspinal edema, concerning for septic arthritis and possibly discitis/osteomyelitis given the reported clinical concern for infection. A repeat MRI of the cervical spine with contrast is recommended when the patient is able (possibly with sedation) to better evaluate.  MRI L-spine.  1. Chronic compression fractures of T12, L2, L3, and L5 with no complicating features. 2. Subtle and nonspecific bilateral erector spinae muscle edema. A similar appearance can be seen in chronically debilitated patients. 3. 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 4. Capacious lumbar spinal canal with no spinal stenosis  CT scan abdomen pelvis.1. Emphysematous changes and chronic bronchitic changes in the lung bases. Trace bilateral pleural effusions. 2. Heterogeneous nephrograms bilaterally suggesting acute pyelonephritis. No abscess or hydronephrosis. 3. Moderate-sized right inguinal hernia containing bowel without obvious proximal obstruction. Visualization is limited due to streak artifact. 4. Aortic atherosclerosis.  Inferior vena caval filter. 5. Small amount of free fluid in the abdomen of nonspecific etiology      Disposition Plan  :    Status is: Inpatient   DVT Prophylaxis  :     apixaban (ELIQUIS) tablet 5 mg     Lab Results  Component Value Date   PLT 353 12/12/2023    Diet :  Diet Order              Diet regular Fluid consistency: Thin; Fluid restriction: 1200 mL Fluid  Diet effective now                    Inpatient Medications  Scheduled Meds:  apixaban  5 mg Oral BID   atorvastatin  80 mg Oral QHS   carvedilol  3.125 mg Oral BID WC   Chlorhexidine Gluconate Cloth  6 each Topical Daily   famotidine  20 mg Oral Daily   feeding supplement  237 mL Oral BID BM   multivitamin with minerals  1 tablet Oral Daily   saccharomyces boulardii  250 mg Oral BID   spironolactone  25 mg Oral Daily   Continuous Infusions:  vancomycin 1,000 mg (12/11/23 1218)   PRN Meds:.acetaminophen, HYDROmorphone (DILAUDID) injection, melatonin, naLOXone (NARCAN)  injection, oxyCODONE, polyethylene glycol, prochlorperazine, sodium chloride flush     Objective:   Vitals:   12/11/23 1700 12/11/23 1953 12/11/23 2357 12/12/23 0444  BP:  106/75 (!) 116/96 119/84  Pulse: 94     Resp: 15 16 17 19   Temp:  98.7 F (37.1 C) 98.6 F (37 C) 98.2 F (36.8 C)  TempSrc:  Oral Oral Oral  SpO2:      Weight:  54.5 kg    Height:        Wt Readings from Last 3 Encounters:  12/11/23 54.5 kg  10/23/18 72.6 kg  09/09/18 70.3 kg    No intake or output data in the 24 hours ending 12/12/23 0741    Physical Exam  Awake Alert, No new F.N deficits, Normal affect [place].AT,PERRAL Supple Neck, No JVD,   Symmetrical Chest wall movement, Good air movement bilaterally, CTAB RRR  +ve B.Sounds, Abd Soft, No tenderness,   No Cyanosis, Clubbing or edema      Data Review:    Recent Labs  Lab  12/08/23 0522 12/09/23 0533 12/10/23 0449 12/11/23 0314 12/12/23 0330  WBC 7.9 7.5 6.8 9.1 7.2  HGB 10.7* 12.2* 9.7* 10.5* 9.2*  HCT 32.2* 36.8* 29.5* 32.1* 28.0*  PLT 436* 502* 392 451* 353  MCV 85.9 86.8 87.3 86.8 87.0  MCH 28.5 28.8 28.7 28.4 28.6  MCHC 33.2 33.2 32.9 32.7 32.9  RDW 16.0* 15.9* 16.1* 16.2* 16.0*  LYMPHSABS 0.9 1.3 0.8 0.9 0.8  MONOABS 0.8 0.6 0.6 0.4 0.6  EOSABS 0.1 0.2 0.2  0.1 0.2  BASOSABS 0.0 0.1 0.0 0.0 0.0    Recent Labs  Lab 12/06/23 2028 12/06/23 2028 12/06/23 2103 12/06/23 2203 12/06/23 2359 12/07/23 0637 12/07/23 0641 12/07/23 1500 12/08/23 0522 12/08/23 0556 12/09/23 0533 12/10/23 0449 12/11/23 0314 12/12/23 0330  NA 130*  --   --   --   --   --  128*  --  126*  --  129* 128* 128* 125*  K 4.2  --   --   --   --   --  4.2  --  3.9  --  3.8 3.8 3.8 3.6  CL 96*  --   --   --   --   --  98  --  97*  --  93* 92* 92* 89*  CO2 19*  --   --   --   --   --  22  --  23  --  27 26 26 26   ANIONGAP 15  --   --   --   --   --  8  --  6  --  9 10 10 10   GLUCOSE 80  --   --   --   --   --  102*  --  91  --  91 100* 98 99  BUN 14  --   --   --   --   --  12  --  10  --  14 20 30* 27*  CREATININE 1.05  --   --   --   --   --  0.90  --  0.78  --  1.04 1.18 1.27* 0.98  AST 31  --   --   --   --   --   --   --   --   --  31 26 19 20   ALT 27  --   --   --   --   --   --   --   --   --  25 23 19 16   ALKPHOS 70  --   --   --   --   --   --   --   --   --  72 58 66 63  BILITOT 0.8  --   --   --   --   --   --   --   --   --  0.6 0.5 0.8 0.9  ALBUMIN 2.3*  --   --   --   --   --   --   --   --   --  2.3* 2.1* 2.0* 1.9*  CRP  --   --   --   --   --   --   --   --   --  5.9* 5.5* 3.6* 4.4* 2.9*  PROCALCITON  --   --   --   --   --   --   --   --  <  0.10  --  <0.10  --   --   --   LATICACIDVEN  --   --  2.5*  --  2.3* 1.8  --  1.6  --   --   --   --   --   --   INR  --   --   --  1.6*  --   --   --   --   --   --   --   --   --   --   BNP  --   --   --   --   --   --   --   --   --  965.0* 424.6* 154.1* 236.1* 178.0*  MG  --    < >  --   --   --   --  1.6*  --  1.6*  --  1.8 1.5* 2.4 1.7  PHOS  --   --   --   --   --   --  2.9  --   --   --   --   --   --   --   CALCIUM 9.1  --   --   --   --   --  8.7*  --  8.5*  --  9.2 8.8* 8.8* 8.4*   < > = values in this interval not displayed.      Recent Labs  Lab 12/06/23 2103 12/06/23 2203 12/06/23 2359  12/07/23 1610 12/07/23 0641 12/07/23 1500 12/08/23 0522 12/08/23 0556 12/09/23 0533 12/10/23 0449 12/11/23 0314 12/12/23 0330  CRP  --   --   --   --   --   --   --  5.9* 5.5* 3.6* 4.4* 2.9*  PROCALCITON  --   --   --   --   --   --  <0.10  --  <0.10  --   --   --   LATICACIDVEN 2.5*  --  2.3* 1.8  --  1.6  --   --   --   --   --   --   INR  --  1.6*  --   --   --   --   --   --   --   --   --   --   BNP  --   --   --   --   --   --   --  965.0* 424.6* 154.1* 236.1* 178.0*  MG  --   --   --   --    < >  --  1.6*  --  1.8 1.5* 2.4 1.7  CALCIUM  --   --   --   --    < >  --  8.5*  --  9.2 8.8* 8.8* 8.4*   < > = values in this interval not displayed.    --------------------------------------------------------------------------------------------------------------- Lab Results  Component Value Date   CHOL 114 12/09/2023   HDL 43 12/09/2023   LDLCALC 59 12/09/2023   TRIG 59 12/09/2023   CHOLHDL 2.7 12/09/2023    No results found for: "HGBA1C" No results for input(s): "TSH", "T4TOTAL", "FREET4", "T3FREE", "THYROIDAB" in the last 72 hours. No results for input(s): "VITAMINB12", "FOLATE", "FERRITIN", "TIBC", "IRON", "RETICCTPCT" in the last 72 hours. ------------------------------------------------------------------------------------------------------------------ Cardiac Enzymes No results for input(s): "CKMB", "TROPONINI", "MYOGLOBIN" in the last 168 hours.  Invalid input(s): "CK"  Micro Results Recent Results (from the past  240 hours)  Culture, blood (Routine x 2)     Status: None   Collection Time: 12/06/23  8:28 PM   Specimen: BLOOD LEFT FOREARM  Result Value Ref Range Status   Specimen Description BLOOD LEFT FOREARM  Final   Special Requests   Final    BOTTLES DRAWN AEROBIC ONLY Blood Culture results may not be optimal due to an inadequate volume of blood received in culture bottles   Culture   Final    NO GROWTH 5 DAYS Performed at [facility], [address], [place], [place] [address]    Report Status 12/11/2023 FINAL  Final  Culture, blood (Routine x 2)     Status: None   Collection Time: 12/06/23  8:33 PM   Specimen: BLOOD RIGHT FOREARM  Result Value Ref Range Status   Specimen Description BLOOD RIGHT FOREARM  Final   Special Requests   Final    BOTTLES DRAWN AEROBIC AND ANAEROBIC Blood Culture results may not be optimal due to an inadequate volume of blood received in culture bottles   Culture   Final    NO GROWTH 5 DAYS Performed at [facility], [address] [place], [place] [address]    Report Status 12/11/2023 FINAL  Final    Radiology Report DG Pelvis 1-2 Views Result Date: 12/11/2023 CLINICAL DATA:  History of total hip arthroplasty. EXAM: PELVIS - 1-2 VIEW COMPARISON:  CT 12/06/2023 FINDINGS: Right hip arthroplasty. 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 The heterotopic calcification about the right hemipelvis is not as well demonstrated on the current exam. The acetabular thinning is better demonstrated on recent CT. Distal most femoral stem is not entirely included in the field of view. The bones are subjectively under mineralized IMPRESSION: Right hip arthroplasty with acetabular protrusion, similar to recent abdominal CT. Electronically Signed   By: Narda Rutherford M.D.   On: 12/11/2023 16:37   MR HIP RIGHT WO CONTRAST Result Date: 12/10/2023 CLINICAL DATA:  Septic arthritis. 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 Acute on chronic right hip pain. MRSA bacteremia. Acute on chronic right hip pain. EXAM: MR OF THE RIGHT HIP WITHOUT CONTRAST TECHNIQUE: Multiplanar, multisequence MR imaging was performed. No intravenous contrast was administered. The technologist reports the study was limited due to patient's evaluate tolerate the exam protocol. Best images possible were obtained within the limitations of patient motion and metallic artifact. The patient refused to continue for postcontrast portion of the  study. COMPARISON:  CT abdomen and pelvis 12/06/2023 FINDINGS: Despite efforts by the technologist and patient, mild to moderate motion artifact is present on today's exam and could not be eliminated. This reduces exam sensitivity and specificity. Bones/joints/cartilage: There is metallic susceptibility artifact from total right hip arthroplasty. 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 There is decreased T1 and increased T2 signal fluid inferior to the right femoral neck prosthesis, suggesting a complex joint effusion with mild-to-moderate joint capsule thickening. This fluid extends into the right obturator externus muscle, measuring up to 4.6 x 2.3 x 2.6 cm (transverse by AP by craniocaudal; axial series 6, image 41 and coronal series 10, image 9). There is also a thin connection of this fluid extending to mild fluid within the right iliopsoas bursa (axial series 6 images 13 through  33). Overall, this fluid appears to represent complex joint fluid extending into the right iliopsoas bursa. Within the limitations of metallic artifact, there also may be joint fluid extending inferior to the right femoral prosthetic head-neck junction (axial series 6, image 21 and coronal series 9, image 20). There also appears to be posterior extension of joint fluid (axial series 6, image 24) into a fluid collection wrapping around the posterior aspect of the right greater trochanter (axial series 6, image 23 and coronal series 10, image 28), measuring up to approximately 5.0 x 1.9 x 3.2 cm (transverse by AP by craniocaudal). There are mild septations within this fluid, and there is additional adjacent septated fluid extending more superiorly, into the right gluteus medius muscle (axial series 6,  image 9 and coronal series 10, image 23) measuring up tor 4.7 x 2.3 x 2.0 cm (transverse by AP by craniocaudal. This is suspicious for infected joint fluid and developing abscesses. Muscles and tendons Muscles and tendons: Complex joint fluid extending into the right obturator externus muscle as above. Moderate right gluteus minimus and medius muscle edema. Focal edema within the anterior aspect of the left gluteus medius muscle (axial series 6, image 1). Other findings Miscellaneous: There is metallic susceptibility artifact in the region of the posterior right hip/buttock (axial series 6, image 11 and coronal series 9, image 6). Recommend clinical correlation for possible surgical skin staples in this region. IMPRESSION: 1. Status post total right hip arthroplasty. 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 2. There is complex joint fluid inferior to the right femoral neck prosthesis with mild-to-moderate joint capsule thickening. This fluid extends into the right obturator externus muscle and into the right iliopsoas bursa. There also appears to be posterior extension of joint fluid into a fluid collection wrapping around the posterior aspect of the right greater trochanter and extending superiorly into the right gluteus medius muscle. This is suspicious for right humerus however septic arthritis with the infected joint fluid extending peripherally. 3. Moderate right gluteus minimus and medius muscle edema. Focal edema within the anterior aspect of the left gluteus medius muscle. 4. There is metallic susceptibility artifact in the region of the posterior right hip/buttock. Recommend clinical correlation for possible surgical skin staples in this region. Electronically Signed   By: Neita Garnet M.D.   On:  12/10/2023 14:35     Signature  -   Susa Raring M.D on 12/12/2023 at 7:41 AM   -  To page go to [email or web address]

## 2023-12-12 NOTE — Progress Notes (Signed)
 PT Cancellation Note  Patient Details Name: [name] [name] MRN: [id number] DOB: [birth year]   Cancelled Treatment:    Reason Eval/Treat Not Completed: Pain limiting ability to participate. Pt reports pain in abdomen/generalized that is currently limiting ability to move. Educated pt on the importance of mobility with pt agreeing to participate in therapy tomorrow. Acute PT to follow.  Hilton Cork, PT, DPT Secure Chat Preferred  Rehab Office [phone]   Arturo Morton Brion Aliment 12/12/2023, 2:31 PM

## 2023-12-13 DIAGNOSIS — B9562 Methicillin resistant Staphylococcus aureus infection as the cause of diseases classified elsewhere: Secondary | ICD-10-CM | POA: Diagnosis not present

## 2023-12-13 DIAGNOSIS — I5042 Chronic combined systolic (congestive) and diastolic (congestive) heart failure: Secondary | ICD-10-CM | POA: Diagnosis not present

## 2023-12-13 DIAGNOSIS — I079 Rheumatic tricuspid valve disease, unspecified: Secondary | ICD-10-CM | POA: Diagnosis not present

## 2023-12-13 DIAGNOSIS — G9520 Unspecified cord compression: Secondary | ICD-10-CM | POA: Diagnosis not present

## 2023-12-13 DIAGNOSIS — R7881 Bacteremia: Secondary | ICD-10-CM | POA: Diagnosis not present

## 2023-12-13 DIAGNOSIS — M4656 Other infective spondylopathies, lumbar region: Secondary | ICD-10-CM

## 2023-12-13 DIAGNOSIS — E871 Hypo-osmolality and hyponatremia: Secondary | ICD-10-CM | POA: Diagnosis not present

## 2023-12-13 LAB — CBC WITH DIFFERENTIAL/PLATELET
Abs Immature Granulocytes: 0.03 10*3/uL (ref 0.00–0.07)
Basophils Absolute: 0 10*3/uL (ref 0.0–0.1)
Basophils Relative: 0 %
Eosinophils Absolute: 0.2 10*3/uL (ref 0.0–0.5)
Eosinophils Relative: 3 %
HCT: 28.2 % — ABNORMAL LOW (ref 39.0–52.0)
Hemoglobin: 9.1 g/dL — ABNORMAL LOW (ref 13.0–17.0)
Immature Granulocytes: 0 %
Lymphocytes Relative: 13 %
Lymphs Abs: 0.9 10*3/uL (ref 0.7–4.0)
MCH: 28.4 pg (ref 26.0–34.0)
MCHC: 32.3 g/dL (ref 30.0–36.0)
MCV: 88.1 fL (ref 80.0–100.0)
Monocytes Absolute: 0.7 10*3/uL (ref 0.1–1.0)
Monocytes Relative: 10 %
Neutro Abs: 5 10*3/uL (ref 1.7–7.7)
Neutrophils Relative %: 74 %
Platelets: 318 10*3/uL (ref 150–400)
RBC: 3.2 MIL/uL — ABNORMAL LOW (ref 4.22–5.81)
RDW: 15.9 % — ABNORMAL HIGH (ref 11.5–15.5)
WBC: 6.9 10*3/uL (ref 4.0–10.5)
nRBC: 0 % (ref 0.0–0.2)

## 2023-12-13 LAB — COMPREHENSIVE METABOLIC PANEL WITH GFR
ALT: 17 U/L (ref 0–44)
AST: 21 U/L (ref 15–41)
Albumin: 1.9 g/dL — ABNORMAL LOW (ref 3.5–5.0)
Alkaline Phosphatase: 59 U/L (ref 38–126)
Anion gap: 7 (ref 5–15)
BUN: 20 mg/dL (ref 8–23)
CO2: 25 mmol/L (ref 22–32)
Calcium: 8.4 mg/dL — ABNORMAL LOW (ref 8.9–10.3)
Chloride: 95 mmol/L — ABNORMAL LOW (ref 98–111)
Creatinine, Ser: 0.81 mg/dL (ref 0.61–1.24)
GFR, Estimated: >60 mL/min (ref >60)
Glucose, Bld: 98 mg/dL (ref 70–99)
Potassium: 4.1 mmol/L (ref 3.5–5.1)
Sodium: 127 mmol/L — ABNORMAL LOW (ref 135–145)
Total Bilirubin: 0.6 mg/dL (ref 0.0–1.2)
Total Protein: 7 g/dL (ref 6.5–8.1)

## 2023-12-13 LAB — CREATININE, URINE, RANDOM: Creatinine, Urine: 72 mg/dL

## 2023-12-13 LAB — SODIUM, URINE, RANDOM: Sodium, Ur: 45 mmol/L

## 2023-12-13 LAB — MAGNESIUM: Magnesium: 1.7 mg/dL (ref 1.7–2.4)

## 2023-12-13 LAB — OSMOLALITY, URINE: Osmolality, Ur: 443 mosm/kg (ref 300–900)

## 2023-12-13 LAB — C-REACTIVE PROTEIN: CRP: 2.7 mg/dL — ABNORMAL HIGH (ref <1.0)

## 2023-12-13 LAB — URIC ACID: Uric Acid, Serum: 6.2 mg/dL (ref 3.7–8.6)

## 2023-12-13 LAB — PROCALCITONIN: Procalcitonin: <0.1 ng/mL

## 2023-12-13 LAB — OSMOLALITY: Osmolality: 275 mosm/kg (ref 275–295)

## 2023-12-13 MED ORDER — VANCOMYCIN HCL 1250 MG/250ML IV SOLN
1250.0000 mg | INTRAVENOUS | Status: DC
  Administered 2023-12-13 – 2023-12-18 (×6): 1250 mg via INTRAVENOUS
  Filled 2023-12-13 (×6): qty 250

## 2023-12-13 MED ORDER — TOLVAPTAN 15 MG PO TABS
15.0000 mg | ORAL_TABLET | Freq: Once | ORAL | Status: AC
  Administered 2023-12-13: 15 mg via ORAL
  Filled 2023-12-13: qty 1

## 2023-12-13 NOTE — Progress Notes (Addendum)
 Rounding Note    Patient Name: [name] [name] Date of Encounter: 12/13/2023  [facility] Cardiologist: Dr. Odis Hollingshead  Subjective   No significant overnight events. His main complaint this morning is continued back pain. No cardiac complaints. No chest pain or shortness of breath.   Inpatient Medications    Scheduled Meds:  apixaban  5 mg Oral BID   atorvastatin  80 mg Oral QHS   carvedilol  3.125 mg Oral BID WC   Chlorhexidine Gluconate Cloth  6 each Topical Daily   famotidine  20 mg Oral Daily   feeding supplement  237 mL Oral BID BM   multivitamin with minerals  1 tablet Oral Daily   saccharomyces boulardii  250 mg Oral BID   spironolactone  25 mg Oral Daily   tolvaptan  15 mg Oral Once   Continuous Infusions:  vancomycin     PRN Meds: acetaminophen, HYDROmorphone (DILAUDID) injection, melatonin, naLOXone (NARCAN)  injection, oxyCODONE, polyethylene glycol, prochlorperazine, sodium chloride flush   Vital Signs    Vitals:   12/12/23 0800 12/12/23 1641 12/12/23 2015 12/12/23 2340  BP: 107/68 96/67 97/63  111/66  Pulse:  94 74 80  Resp: 18 18 19 17   Temp: 98.2 F (36.8 C) 98.1 F (36.7 C) 98.5 F (36.9 C) 98.2 F (36.8 C)  TempSrc:  Oral Oral Oral  SpO2: 98% 97% 98% 96%  Weight:      Height:       No intake or output data in the 24 hours ending 12/13/23 0828    12/11/2023    7:53 PM 12/10/2023   11:27 PM 12/10/2023    7:01 AM  Last 3 Weights  Weight (lbs) 120 lb 2.4 oz 124 lb 5.4 oz 128 lb 12 oz  Weight (kg) 54.5 kg 56.4 kg 58.4 kg      Telemetry    Normal sinus rhythm with rates in the 70s to 80s. Occasional PVCs. - Personally Reviewed  ECG    No new ECG tracing today. - Personally Reviewed  Physical Exam   GEN: Thin cachetic Caucasian male resting comfortably in no acute distress.   Neck: No JVD. Cardiac: RRR. No murmurs, rubs, or gallops.  Respiratory: No increased work of breathing. Clear to auscultation bilaterally. No wheezes, rhonchi,  or rales. MS: No lower extremity edema. No deformity. Skin: Warm and dry. Neuro:  No focal deficits. Psych: Normal affect. Responds appropriately.   Labs    High Sensitivity Troponin:   Recent Labs  Lab 12/06/23 2028 12/06/23 2203  TROPONINIHS 13 14     Chemistry Recent Labs  Lab 12/11/23 0314 12/12/23 0330 12/13/23 0403  NA 128* 125* 127*  K 3.8 3.6 4.1  CL 92* 89* 95*  CO2 26 26 25   GLUCOSE 98 99 98  BUN 30* 27* 20  CREATININE 1.27* 0.98 0.81  CALCIUM 8.8* 8.4* 8.4*  MG 2.4 1.7 1.7  PROT 7.6 7.0 7.0  ALBUMIN 2.0* 1.9* 1.9*  AST 19 20 21   ALT 19 16 17   ALKPHOS 66 63 59  BILITOT 0.8 0.9 0.6  GFRNONAA >60 >60 >60  ANIONGAP 10 10 7     Lipids  Recent Labs  Lab 12/09/23 0533  CHOL 114  TRIG 59  HDL 43  LDLCALC 59  CHOLHDL 2.7    Hematology Recent Labs  Lab 12/11/23 0314 12/12/23 0330 12/13/23 0403  WBC 9.1 7.2 6.9  RBC 3.70* 3.22* 3.20*  HGB 10.5* 9.2* 9.1*  HCT 32.1* 28.0* 28.2*  MCV  86.8 87.0 88.1  MCH 28.4 28.6 28.4  MCHC 32.7 32.9 32.3  RDW 16.2* 16.0* 15.9*  PLT 451* 353 318   Thyroid No results for input(s): "TSH", "FREET4" in the last 168 hours.  BNP Recent Labs  Lab 12/10/23 0449 12/11/23 0314 12/12/23 0330  BNP 154.1* 236.1* 178.0*    DDimer No results for input(s): "DDIMER" in the last 168 hours.   Radiology    DG Pelvis 1-2 Views Result Date: 12/11/2023 CLINICAL DATA:  History of total hip arthroplasty. EXAM: PELVIS - 1-2 VIEW COMPARISON:  CT 12/06/2023 FINDINGS: Right hip arthroplasty. 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 The heterotopic calcification about the right hemipelvis is not as well demonstrated on the current exam. The acetabular thinning is better demonstrated on recent CT. Distal most femoral stem is not entirely included in the field of view. The bones are subjectively under mineralized IMPRESSION: Right hip arthroplasty with acetabular protrusion, similar to recent abdominal CT. Electronically Signed    By: Narda Rutherford M.D.   On: 12/11/2023 16:37    Cardiac Studies   Echocardiogram 12/09/2023: Impressions: 1. Left ventricular ejection fraction, by estimation, is 20 to 25%. The  left ventricle has severely decreased function. The left ventricle  demonstrates global hypokinesis. The left ventricular internal cavity size  was mildly dilated. Left ventricular  diastolic parameters are consistent with Grade I diastolic dysfunction  (impaired relaxation). No LV thrombus noted.   2. Right ventricular systolic function is mildly reduced. The right  ventricular size is normal. Tricuspid regurgitation signal is inadequate  for assessing PA pressure.   3. The mitral valve is normal in structure. No evidence of mitral valve  regurgitation. No evidence of mitral stenosis. Moderate mitral annular  calcification.   4. The aortic valve was not well visualized. Aortic valve regurgitation  is not visualized. No aortic stenosis is present.   5. IVC not visualized.    Patient Profile     63 y.o. male with a history of severe multivessel CAD noted on cardiac catheterization (medical therapy was recommended given drug use), chronic HFrEF with EF of <15% on last Echo in 06/2023, prior PE/ DVT, hypertension, hyperlipidemia, COPD, homelessness, and polysubstance abuse (including cocaine and amphetamines). He has had multiple admissions at outside hospital lately. He was admitted at [facility] from 10/26/2023 to 11/03/2023 for acute on chronic CHF. He was readmitted from 11/15/2023 to 11/23/2023 again for CHF.  He was initially going to be discharge on 11/23/2023 but while waiting in the discharge suite became hypoxic and was ultimately readmitted for COPD exacerbation. This hospitalization was complicated by acute agitation and suicide ideation requiring transfer to locked unit and Psychiatry consultation. He was ultimately found to have MRSA bacteremia. TEE was not performed due to patient's  agitation and refusal of treatment. He was started on antibiotics and discharged on 12/06/2023. He presented to [facility] later that same day with reports of back/ abdominal pain and was readmitted fro MRSA bacteremia. Cardiology was consulted on 12/08/2023 for CHF and need for TEE at the request of Dr. Thedore Mins.  Assessment & Plan    Chronic HFrEF Patient has a long history of cardiomyopathy/ chronic HFrEF dating back to at least 2021. EF as low as 15% in the past. He has had 2 recent admission at [facility] over the last 2 months for acute CHF. Echo on 11/27/2023 at [facility] showed LVEF of 20-25%. Repeat Echo this admission showed LVEF of 20-25% with  global hypokinesis and grade 1 diastolic dysfunction as well as mildly reduced RV function. BNP was initially 965 this admission but has been trending down - 178 today. Chest x-ray showed trace bilateral effusions and bibasilar streaky atelectasis. He was restarted on PO medications this admission. No updated weight since 3/26 but weight down 19 lbs since admission at that time. - Euvolemic one exam.  - Torsemide was held on 3/26 due to creatinine. Creatinine has now improved. Will continue to hold given he is euvolemic. Can dose as needed. - Continue Spironolactone 25mg  daily.  - Continue Coreg 3.125mg  twice daily. - Entresto would be ideal but BP has been soft and don't know if he would be able to tolerate this. May be able to tolerate low dose Losartan at some point but systolic BP still in the 90s at times so will hold off for now. Could consider adding as an outpatient. - No SGLT2 inhibitor given concern for infection risk. - Continue to monitor daily weight, strict I/Os, and renal function.  CAD LHC in 02/2022 showed severe multivessel disease including 50% stenosis of left main. CT surgery was consulted at that time and recommended cardiac MRI to assess for viability but he declined. He was overall felt to be a poor surgical candidate  given continued drug use and homelessness. Medical therapy was recommended. - No chest pain.  - Not on aspirin due to need for full anticoagulation. - Continue Lipitor 80mg  daily.  Hypertension History of hypertension but BP soft this admission. - Continue medications for CHF as above.  Hyperlipidemia Lipid panel this admission: Total Cholesterol 114, Triglycerides 59, HDL 43, LDL 59.  - Continue Lipitor 80mg  daily.  History of PE/ DVT - He has been maintained on long-term anticoagulation with Eliquis 5mg  twice daily.  MRSA Bacteremia Patient was recently diagnosed with MRSA bacteremia at [facility] earlier this month. TEE was not felt to be safe to be performed at that time due to patient's agitation and refusal of treatment. ID here was consulted and recommended TEE. He was evaluated on 3/23 and not felt to be a good candidate for this. Repeat TTE showed no evidence of vegetation and repeat blood cultures here were negative. - Continue antibiotics per ID.  Otherwise, per primary team: - Osteomyelitis  - Possible acute pyelonephritis  - COPD - Chronic hyponatremia: Na 127 today - Hypoalbunemia - Polysubstance abuse: UDS in 09/2023 at [facility] positive for cocaine and amphetamines. UDS negative this admission. - Homelessness  [facility] will sign off.   Medication Recommendations: Spironolactone 25 mg daily, carvedilol 3.125 mg twice daily, Eliquis 5 mg twice daily.  Recommend as needed torsemide 20 mg; would monitor daily weights and can take torsemide if gains more than 3 pounds in 1 day or 5 pounds in 1 week Other recommendations (labs, testing, etc): BMET within 1 week Follow up as an outpatient: We will schedule   For questions or updates, please contact [facility] Please consult [email or web address] for contact info under        Signed, Corrin Parker, PA-C  12/13/2023, 8:28 AM     Patient seen and examined.  Agree  with above documentation.  On exam, patient is alert and oriented, regular rate and rhythm, no murmurs, lungs CTAB, no LE edema or JVD.  Appears euvolemic, renal function improved with holding torsemide.  Can discharge on as needed torsemide.  No further cardiac workup recommended, we will sign off at this  time.  Please call if any questions arise.  Little Ishikawa, MD

## 2023-12-13 NOTE — TOC Initial Note (Signed)
 Transition of Care [facility]) - Initial/Assessment Note    Patient Details  Name: [name] [name] MRN: [id number] Date of Birth: Sep 09, 1961  Transition of Care [facility]) CM/SW Contact:    Mearl Latin, LCSW Phone Number: 12/13/2023, 11:48 AM  Clinical Narrative:                 CSW following. No SNF bed offers due to multiple barriers. Continuing to follow.     Barriers to Discharge: Continued Medical Work up, Inadequate or no insurance, SNF Pending bed offer, Active Substance Use - Placement   Patient Goals and CMS Choice            Expected Discharge Plan and Services In-house Referral: Clinical Social Work   Post Acute Care Choice: Skilled Nursing Facility Living arrangements for the past 2 months: Homeless, Hotel/Motel                                      Prior Living Arrangements/Services Living arrangements for the past 2 months: Homeless, Hotel/Motel Lives with:: Self Patient language and need for interpreter reviewed:: Yes          Care giver support system in place?: No (comment)   Criminal Activity/Legal Involvement Pertinent to Current Situation/Hospitalization: No - Comment as needed  Activities of Daily Living   ADL Screening (condition at time of admission) Independently performs ADLs?: No Does the patient have a NEW difficulty with bathing/dressing/toileting/self-feeding that is expected to last >3 days?: No (needs assist) Does the patient have a NEW difficulty with getting in/out of bed, walking, or climbing stairs that is expected to last >3 days?: No (needs assist) Does the patient have a NEW difficulty with communication that is expected to last >3 days?: No Is the patient deaf or have difficulty hearing?: No Does the patient have difficulty seeing, even when wearing glasses/contacts?: No Does the patient have difficulty concentrating, remembering, or making decisions?: Yes  Permission Sought/Granted Permission sought to share information with :  Facility Engineer, maintenance (IT) granted to share info w AGENCY: SNFS        Emotional Assessment Appearance:: Appears stated age     Orientation: : Oriented to Self, Oriented to Place, Oriented to  Time, Oriented to Situation Alcohol / Substance Use: Illicit Drugs Psych Involvement: No (comment)  Admission diagnosis:  Lactic acidosis [E87.20] Hyponatremia [E87.1] MRSA bacteremia [R78.81, B95.62] Acute midline back pain, unspecified back location [M54.9] Compression fracture of lumbar vertebra, unspecified lumbar vertebral level, initial encounter (HCC) [S32.000A] Patient Active Problem List   Diagnosis Date Noted   Hyponatremia 12/12/2023   Protein-calorie malnutrition, severe 12/09/2023   Acute midline back pain 12/08/2023   Compression of lumbar vertebra (HCC) 12/08/2023   Chronic multifocal osteomyelitis of right femur (HCC) 12/08/2023   Neck pain 12/08/2023   Septic embolism (HCC) 12/08/2023   Cachexia (HCC) 12/08/2023   Homeless 12/08/2023   IVDU (intravenous drug user) 12/08/2023   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   Nonintractable headache 12/08/2023   Coronary artery disease involving native coronary artery of native heart 12/08/2023   Chronic combined systolic and diastolic heart failure (HCC) 12/08/2023   Ischemic cardiomyopathy 12/08/2023   Chronic obstructive pulmonary disease (HCC) 12/08/2023   Pulmonary emphysema (HCC) 12/08/2023   Cigarette smoker 12/08/2023   Compression fracture of body of thoracic vertebra (HCC) 12/08/2023   Hx of deep venous thrombosis 12/08/2023  Hx pulmonary embolism 12/08/2023   Protein-calorie malnutrition (HCC) 12/08/2023   MRSA bacteremia 12/07/2023   Major depressive disorder, recurrent episode, moderate (HCC)    Substance abuse (HCC)    Suicidal ideation    Sprained ankle 10/28/2014   Alcohol abuse with alcohol-induced mood disorder (HCC) 10/26/2014   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, [place] - [address] [place] [address] Phone: [phone] Fax: [phone]  [facility] [place] [address] [place], [place] - [address] [facility] [address] [place] [address] Phone: [phone] Fax: [phone]  [facility] [address] [place] [address] Phone: [phone] Fax: [phone]     Social Drivers of Health (SDOH) Social History: SDOH Screenings   Food Insecurity: Medium Risk (11/24/2023)   Received from [facility]  Housing: High Risk (11/24/2023)   Received from [facility]  Transportation Needs: Unmet Transportation Needs (11/24/2023)   Received from [facility]  Utilities: Unknown (11/24/2023)   Received from [facility]  Recent Concern: Utilities - High Risk (11/04/2023)   Received from [facility]  Financial Resource Strain: Low Risk  (06/22/2022)   Received from [facility]  Social Connections: Unknown (01/26/2022)   Received from [facility]  Stress: No Stress Concern Present (07/01/2023)   Received from [facility]  Tobacco Use: High Risk (12/06/2023)   SDOH Interventions: Food Insecurity Interventions: Inpatient TOC, Community Resources Provided Housing Interventions: [employer] Provided, Inpatient TOC Transportation Interventions: Inpatient TOC   Readmission Risk Interventions     No data to display

## 2023-12-13 NOTE — Progress Notes (Signed)
 PT Cancellation Note  Patient Details Name: Tobenna Needs MRN: [id number] DOB: 1960-10-22   Cancelled Treatment:    Reason Eval/Treat Not Completed: Pain limiting ability to participate. Patient refusing to participate in mobility. Due to 3 refusals, PT will sign off on orders. Please re-consult when patient able to participate.   Lanora Manis, PT, DPT  Ruben Gottron 12/13/2023, 11:18 AM

## 2023-12-13 NOTE — Progress Notes (Signed)
 Heart Failure Navigator Progress Note  Assessed for Heart & Vascular TOC clinic readiness.  Patient does not meet criteria due  plan for [facility] f/u, Follow - here for long term IV antbx   Poor prognosis with longstanding HF, advanced CAD, noncompliance, substance history.    Navigator will sign off at this time.   Rhae Hammock, BSN, Scientist, clinical (histocompatibility and immunogenetics) Only

## 2023-12-13 NOTE — Progress Notes (Signed)
 OT Cancellation Note  Patient Details Name: [name] [name] MRN: [id number] DOB: 08/10/1961   Cancelled Treatment:    Reason Eval/Treat Not Completed: Other (comment) (Pt declined participation in acute OT treatment and OOB activity at this time due to "feeling like crap." Pt also declined need to use the restroom or do grooming tasks in bed/at sink. OT to reattempt to see pt at a later time as appropriate/available.)  Chundra Sauerwein "Ronaldo Miyamoto" M., OTR/L, MA Acute Rehab [phone]  Lendon Colonel 12/13/2023, 4:20 PM

## 2023-12-13 NOTE — Progress Notes (Signed)
 PROGRESS NOTE                                                                                                                                                                                                             Patient Demographics:    [name] [name], is a 63 y.o. male, DOB - 01-01-1961, BJY:[id number]  Outpatient Primary MD for the patient is Patient, No Pcp Per    LOS - 6  Admit date - 12/06/2023    Chief Complaint  Patient presents with   Abdominal Pain       Brief Narrative (HPI from H&P)    63 y.o. 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   He did not want to go back to [facility], he was also due for a TEE and was admitted to the hospital here for further care.   Subjective:   Patient in bed, appears comfortable, denies any headache, no fever, no chest pain or pressure, no shortness of breath , no abdominal pain. No focal weakness.   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  Being treated for MRSA bacteremia through IV vancomycin under the guidance of cardiology and ID.  Per neurosurgeon Dr. Conchita Paris C2-C3 osteomyelitis requires medical treatment only, orthopedics following him, IR tried to aspirate his right hip abscess and fluid collection but patient refused.  He has refused multiple tests and procedures this admission despite counseling.  For now continue IV Vancomycin, defer course and duration of antibiotics  to ID and orthopedics.  Per ID keep patient here for long-term IV antibiotics via PICC line, he is homeless, IV drug user, not compliant with appointments and ID feels that prolonged IV antibiotics in the hospital is the safest course for now.  Of note patient was to get Dalbavancin prior to [facility] but appears that he never received it.    C2-C3 osteomyelitis, lower back pain, compression fractures in lumbar spine vertebrae, T12, L2-L3 L5 - repeat MRI L-spine this admission stable, supportive care.  No signs of acute infection, C-spine MRI changes discussed with Dr. Conchita Paris, medical treatment.  Supportive care for brain, history of narcotic seeking behavior.   Lactic acidosis - due to dehydration.  Has been hydrated.   Chronic HFrEF - LVEF 20 to 25%, cardiology following.  Continue on Aldactone further diuretics per cardiology, euvolemic currently.   Possible acute pyelonephritis seen on CT scan, POA  - Continue IV antibiotics, As needed analgesics, follow urine culture.   History of PE/DVT on Eliquis  Resume home Eliquis also has IVC filter.   History of polysubstance abuse  Denies recent use of IV drugs   Tobacco use disorder  - Current smoker, Nicotine patch   Generalized weakness  PT OT assessment, Fall precautions   Severe protein calorie malnutrition - Dietitian consult, protein supplementation  Hyponatremia.  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  No acute issues.  Monitor  No IV access.  PICC line placed 12/09/2023 after much counseling       Condition - Extremely Guarded  Family Communication  :  None present  Code Status :   Full  Consults  :  ID, Cards, Dr. Conchita Paris Neurosurgery, orthopedics, IR  PUD Prophylaxis :  Pepcid   Procedures  :     PICC line placed 12/09/2023   CT R Hip - 1. Status post total right hip arthroplasty. 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 2. There is complex joint fluid inferior to the right femoral neck prosthesis with mild-to-moderate joint capsule thickening. This fluid extends into the right obturator externus muscle and into the right iliopsoas bursa. There also appears to be posterior extension of joint fluid into a fluid collection wrapping around the posterior aspect of the right greater trochanter and extending superiorly into the right gluteus medius muscle. This is suspicious for right humerus however septic arthritis with the infected joint fluid extending peripherally. 3. Moderate right gluteus minimus and medius muscle edema. Focal edema within the anterior aspect of the left gluteus medius muscle. 4. There is metallic susceptibility artifact in the region of the posterior right hip/buttock. Recommend clinical correlation for possible surgical skin staples in this region.   TTE - 1. Left ventricular ejection fraction, by estimation, is 20 to 25%. The left ventricle has severely decreased function. The left ventricle demonstrates global hypokinesis. The left ventricular internal cavity size was mildly dilated. Left ventricular diastolic parameters are consistent with Grade I diastolic dysfunction (impaired relaxation). No LV  thrombus noted.  2. Right ventricular systolic function is mildly reduced. The right ventricular size is normal. Tricuspid regurgitation signal is inadequate for assessing PA pressure.  3. The mitral valve is normal in structure. No evidence of mitral valve regurgitation.  No evidence of mitral stenosis. Moderate mitral annular calcification.  4. The aortic valve was not well visualized. Aortic valve regurgitation is not visualized. No aortic stenosis is present.  5. IVC not visualized.  MRI brain.  Nonacute.    MRI C-spine.   Severely motion limited noncontrast study. Significant marrow edema surrounding the left C2-C3 facet joint and extending to involve the posterior left C2 and C3 vertebral bodies with surrounding paraspinal edema, concerning for septic arthritis and possibly discitis/osteomyelitis given the reported clinical concern for infection. A repeat MRI of the cervical spine with contrast is recommended when the patient is able (possibly with sedation) to better evaluate.  MRI L-spine.  1. Chronic compression fractures of T12, L2, L3, and L5 with no complicating features. 2. Subtle and nonspecific bilateral erector spinae muscle edema. A similar appearance can be seen in chronically debilitated patients. 3. 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 4. Capacious lumbar spinal canal with no spinal stenosis  CT scan abdomen pelvis.1. Emphysematous changes and chronic bronchitic changes in the lung bases. Trace bilateral pleural effusions. 2. Heterogeneous nephrograms bilaterally suggesting acute pyelonephritis. No abscess or hydronephrosis. 3. Moderate-sized right inguinal hernia containing bowel without obvious proximal obstruction. Visualization is limited due to streak artifact. 4. Aortic atherosclerosis.  Inferior vena caval filter. 5. Small amount of free fluid in the abdomen of  nonspecific etiology      Disposition Plan  :    Status is: Inpatient   DVT Prophylaxis  :     apixaban (ELIQUIS) tablet 5 mg     Lab Results  Component Value Date   PLT 318 12/13/2023    Diet :  Diet Order             Diet regular Fluid consistency: Thin  Diet effective now                    Inpatient Medications  Scheduled Meds:  apixaban  5 mg Oral BID   atorvastatin  80 mg Oral QHS   carvedilol  3.125 mg Oral BID WC   Chlorhexidine Gluconate Cloth  6 each Topical Daily   famotidine  20 mg Oral Daily   feeding supplement  237 mL Oral BID BM   multivitamin with minerals  1 tablet Oral Daily   saccharomyces boulardii  250 mg Oral BID   spironolactone  25 mg Oral Daily   tolvaptan  15 mg Oral Once   Continuous Infusions:  vancomycin     PRN Meds:.acetaminophen, HYDROmorphone (DILAUDID) injection, melatonin, naLOXone (NARCAN)  injection, oxyCODONE, polyethylene glycol, prochlorperazine, sodium chloride flush     Objective:   Vitals:   12/12/23 0800 12/12/23 1641 12/12/23 2015 12/12/23 2340  BP: 107/68 96/67 97/63  111/66  Pulse:  94 74 80  Resp: 18 18 19 17   Temp: 98.2 F (36.8 C) 98.1 F (36.7 C) 98.5 F (36.9 C) 98.2 F (36.8 C)  TempSrc:  Oral Oral Oral  SpO2: 98% 97% 98% 96%  Weight:      Height:        Wt Readings from Last 3 Encounters:  12/11/23 54.5 kg  10/23/18 72.6 kg  09/09/18 70.3 kg    No intake or output data in the 24 hours ending 12/13/23 0811    Physical Exam  Awake Alert, No new F.N deficits, Normal affect [place].AT,PERRAL Supple Neck, No JVD,   Symmetrical  Chest wall movement, Good air movement bilaterally, CTAB RRR  +ve B.Sounds, Abd Soft, No tenderness,   No Cyanosis, Clubbing or edema      Data Review:    Recent Labs  Lab 12/09/23 0533 12/10/23 0449 12/11/23 0314 12/12/23 0330 12/13/23 0403  WBC 7.5 6.8 9.1 7.2 6.9  HGB 12.2* 9.7* 10.5* 9.2* 9.1*  HCT 36.8* 29.5* 32.1* 28.0* 28.2*  PLT 502* 392  451* 353 318  MCV 86.8 87.3 86.8 87.0 88.1  MCH 28.8 28.7 28.4 28.6 28.4  MCHC 33.2 32.9 32.7 32.9 32.3  RDW 15.9* 16.1* 16.2* 16.0* 15.9*  LYMPHSABS 1.3 0.8 0.9 0.8 0.9  MONOABS 0.6 0.6 0.4 0.6 0.7  EOSABS 0.2 0.2 0.1 0.2 0.2  BASOSABS 0.1 0.0 0.0 0.0 0.0    Recent Labs  Lab 12/06/23 2028 12/06/23 2103 12/06/23 2203 12/06/23 2359 12/07/23 0637 12/07/23 0641 12/07/23 1500 12/08/23 0522 12/08/23 0556 12/09/23 0533 12/10/23 0449 12/11/23 0314 12/12/23 0330 12/13/23 0403  NA  --   --   --   --   --  128*  --  126*  --  129* 128* 128* 125* 127*  K  --   --   --   --   --  4.2  --  3.9  --  3.8 3.8 3.8 3.6 4.1  CL  --   --   --   --   --  98  --  97*  --  93* 92* 92* 89* 95*  CO2  --   --   --   --   --  22  --  23  --  27 26 26 26 25   ANIONGAP  --   --   --   --   --  8  --  6  --  9 10 10 10 7   GLUCOSE  --   --   --   --   --  102*  --  91  --  91 100* 98 99 98  BUN  --   --   --   --   --  12  --  10  --  14 20 30* 27* 20  CREATININE  --   --   --   --   --  0.90  --  0.78  --  1.04 1.18 1.27* 0.98 0.81  AST  --   --   --   --   --   --   --   --   --  31 26 19 20 21   ALT  --   --   --   --   --   --   --   --   --  25 23 19 16 17   ALKPHOS  --   --   --   --   --   --   --   --   --  72 58 66 63 59  BILITOT  --   --   --   --   --   --   --   --   --  0.6 0.5 0.8 0.9 0.6  ALBUMIN  --   --   --   --   --   --   --   --   --  2.3* 2.1* 2.0* 1.9* 1.9*  CRP   < >  --   --   --   --   --   --   --  5.9* 5.5* 3.6* 4.4* 2.9* 2.7*  PROCALCITON  --   --   --   --   --   --   --  <0.10  --  <0.10  --   --   --  <0.10  LATICACIDVEN  --  2.5*  --  2.3* 1.8  --  1.6  --   --   --   --   --   --   --   INR  --   --  1.6*  --   --   --   --   --   --   --   --   --   --   --   BNP  --   --   --   --   --   --   --   --  965.0* 424.6* 154.1* 236.1* 178.0*  --   MG   < >  --   --   --   --  1.6*  --  1.6*  --  1.8 1.5* 2.4 1.7 1.7  PHOS  --   --   --   --   --  2.9  --   --   --   --   --    --   --   --   CALCIUM  --   --   --   --   --  8.7*  --  8.5*  --  9.2 8.8* 8.8* 8.4* 8.4*   < > = values in this interval not displayed.      Recent Labs  Lab 12/06/23 2103 12/06/23 2203 12/06/23 2359 12/07/23 1191 12/07/23 0641 12/07/23 1500 12/08/23 0522 12/08/23 0522 12/08/23 0556 12/09/23 0533 12/10/23 0449 12/11/23 0314 12/12/23 0330 12/13/23 0403  CRP  --   --   --   --   --   --   --    < > 5.9* 5.5* 3.6* 4.4* 2.9* 2.7*  PROCALCITON  --   --   --   --   --   --  <0.10  --   --  <0.10  --   --   --  <0.10  LATICACIDVEN 2.5*  --  2.3* 1.8  --  1.6  --   --   --   --   --   --   --   --   INR  --  1.6*  --   --   --   --   --   --   --   --   --   --   --   --   BNP  --   --   --   --   --   --   --   --  965.0* 424.6* 154.1* 236.1* 178.0*  --   MG  --   --   --   --    < >  --  1.6*  --   --  1.8 1.5* 2.4 1.7 1.7  CALCIUM  --   --   --   --    < >  --  8.5*  --   --  9.2 8.8* 8.8* 8.4* 8.4*   < > = values in this interval not displayed.    --------------------------------------------------------------------------------------------------------------- Lab Results  Component Value Date   CHOL 114 12/09/2023   HDL 43 12/09/2023   LDLCALC 59 12/09/2023   TRIG 59 12/09/2023  CHOLHDL 2.7 12/09/2023    No results found for: "HGBA1C" No results for input(s): "TSH", "T4TOTAL", "FREET4", "T3FREE", "THYROIDAB" in the last 72 hours. No results for input(s): "VITAMINB12", "FOLATE", "FERRITIN", "TIBC", "IRON", "RETICCTPCT" in the last 72 hours. ------------------------------------------------------------------------------------------------------------------ Cardiac Enzymes No results for input(s): "CKMB", "TROPONINI", "MYOGLOBIN" in the last 168 hours.  Invalid input(s): "CK"  Micro Results Recent Results (from the past 240 hours)  Culture, blood (Routine x 2)     Status: None   Collection Time: 12/06/23  8:28 PM   Specimen: BLOOD LEFT FOREARM  Result Value Ref Range  Status   Specimen Description BLOOD LEFT FOREARM  Final   Special Requests   Final    BOTTLES DRAWN AEROBIC ONLY Blood Culture results may not be optimal due to an inadequate volume of blood received in culture bottles   Culture   Final    NO GROWTH 5 DAYS Performed at [facility], [address] [place], [place] [address]    Report Status 12/11/2023 FINAL  Final  Culture, blood (Routine x 2)     Status: None   Collection Time: 12/06/23  8:33 PM   Specimen: BLOOD RIGHT FOREARM  Result Value Ref Range Status   Specimen Description BLOOD RIGHT FOREARM  Final   Special Requests   Final    BOTTLES DRAWN AEROBIC AND ANAEROBIC Blood Culture results may not be optimal due to an inadequate volume of blood received in culture bottles   Culture   Final    NO GROWTH 5 DAYS Performed at [facility], [address], [place], [place] [address]    Report Status 12/11/2023 FINAL  Final    Radiology Report DG Pelvis 1-2 Views Result Date: 12/11/2023 CLINICAL DATA:  History of total hip arthroplasty. EXAM: PELVIS - 1-2 VIEW COMPARISON:  CT 12/06/2023 FINDINGS: Right hip arthroplasty. 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 The heterotopic calcification about the right hemipelvis is not as well demonstrated on the current exam. The acetabular thinning is better demonstrated on recent CT. Distal most femoral stem is not entirely included in the field of view. The bones are subjectively under mineralized IMPRESSION: Right hip arthroplasty with acetabular protrusion, similar to recent abdominal CT. Electronically Signed   By: Narda Rutherford M.D.   On: 12/11/2023 16:37     Signature  -   Susa Raring M.D on 12/13/2023 at 8:11 AM   -  To page go to [email or web address]

## 2023-12-13 NOTE — Progress Notes (Signed)
 Pharmacy Antibiotic Note  [name] [name] is a 63 y.o. male admitted on 12/06/2023 with back pain, possible osteomyelitis.  Admitted 3/8-3/21 at [facility] for MRSA bacteremia and possible endocarditis/osteomyelitis --received Vancomycin throughout admission.  Plan was for discharge 3/21 and administration of Dalvancin weekly, but dose not given.  Pharmacy has been consulted for Vancomycin  dosing.   Per documentation last levels at [facility] on 3/19 on 750mg  q24h with trough of 283 (trough: 7.2 and peak 16.2), adjusted regimen to 1250mg  q24h for eAUC ~480. Scr around 0.8 during this time.   Last dose of Vancomycin given here was 1000 mg  3/22 at 0630 and then patient lost IV access. Scr currently 1.04 with CrCl ~ 57 ml/min.   Patient now with  PICC line and will re-start Vancomycin at previously planned 1250 mg every 24 hours based on PK at [facility].   3/28 AM update: Scr 1.27 (increased from 1.04 on initial dose calc) >> 0.81  Plan: Vancomcyin 1000mg  q24h eAUC 397 scr 0.81 Vd 0.72 >> inc vanco 1250 mg iv q24h eAUC 496.7  Levels at steady state Follow  up imaging and disposition plans  Height: 6\' 1"  (185.4 cm) Weight: 54.5 kg (120 lb 2.4 oz) IBW/kg (Calculated) : 79.9  Temp (24hrs), Avg:98.3 F (36.8 C), Min:98.1 F (36.7 C), Max:98.5 F (36.9 C)  Recent Labs  Lab 12/06/23 2103 12/06/23 2359 12/07/23 0455 12/07/23 0637 12/07/23 0641 12/07/23 1500 12/08/23 0522 12/09/23 0533 12/10/23 0449 12/11/23 0314 12/12/23 0330 12/13/23 0403  WBC  --   --    < >  --   --   --    < > 7.5 6.8 9.1 7.2 6.9  CREATININE  --   --   --   --    < >  --    < > 1.04 1.18 1.27* 0.98 0.81  LATICACIDVEN 2.5* 2.3*  --  1.8  --  1.6  --   --   --   --   --   --    < > = values in this interval not displayed.    Estimated Creatinine Clearance: 72 mL/min (by C-G formula based on SCr of 0.81 mg/dL).    Allergies  Allergen Reactions   Fentanyl Itching   Hydromorphone Hcl Itching     Pt  States itching all over Pt  States itching all over Pt  States itching all over    Morphine Itching   Pantoprazole Hives   Pantoprazole Sodium Hives   Haloperidol Other (See Comments)    Pt reports he has "seizures" had haldol on 09/25/21 with no apparent ill effect.  Pt reports he has "seizures"   Nsaids Nausea And Vomiting, Other (See Comments) and Hives    Severe cramping Other reaction(s): Abdominal Pain Cramping per [place]   Morphine And Codeine Itching   Ibuprofen    Ketorolac Other (See Comments)   Pantoprazole Sodium Hives    Cosimo Schertzer BS, PharmD, BCPS Clinical Pharmacist 12/13/2023 7:30 AM  Contact: [phone] after 3 PM  "Be curious, not judgmental..." -Debbora Dus

## 2023-12-13 NOTE — Progress Notes (Signed)
 [facility]  Date of Admission:  12/06/2023      Total days of antibiotics 6   Vanco         ASSESSMENT: [name] [name] is a 63 y.o. male admitted with:   H/O MRSA Bacteremia 11/26/23-  At outside hospital. Blood cultures when admitted 3/21 not growing any MRSA.  Intermediate resistance to tetracyclines with MIC 8  Tricuspid Valve Endocarditis (h/o IVDU) -  Cardiomyopathy -  Advanced CAD -  No surgical offerings available -  Under treated as he never went for dalvance and has had disjointed care with multiple admissions / AMA discharges.  Echo here with HFrEF 20-25% which dates back to 2021. No large vegetations seen.  Cardiology continues to follow while inpatient.  Deferred TEE this admission with no surgical options and no additional benefit from this procedure with already long indication to treat with other sites of infection.   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 He is more bothered by compression fx in lower spine than c spine -6 weeks IV abx recommended.   THR Septic Arthritis on Imaging -  Unable to bear weight > 3 weeks now. Appreciate orthopedics evaluation. Planning on aspirate from hip however he declined. We discussed that if his prosthesis is indeed infected he has a high chance of failure without the primary surgical intervention. He will reconsider and update [place] next week  -He declined IR aspiration - we discussed this and he will reconsider over the weekend and let us know next week -No plans for rifampin given DOAC chronically.  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  UDS clean when he arrived. Seems like last use was in  February 2025.  -would recommend he at least get a few weeks of IV therapy here supervised >> then we can consider oritavancin x 1 with bridge to prolonged oral treatment.    PLAN: Continue vancomycin  Will need oral susceptibilities from OSH eventually  Re-discuss next week about IR aspiration of Rt hip   Dr. Renold Don is available over the weekend for any ID related questions.    Principal Problem:   MRSA bacteremia Active Problems:   Acute midline back pain   Compression of lumbar vertebra (HCC)   Chronic multifocal osteomyelitis of right femur (HCC)   Neck pain   Septic embolism (HCC)   Cachexia (HCC)   Homeless   IVDU (intravenous drug user)   Endocarditis of tricuspid valve   Nonintractable headache   Coronary artery disease involving native coronary artery of native heart   Chronic combined systolic and diastolic heart failure (HCC)   Ischemic cardiomyopathy   Chronic obstructive pulmonary disease (HCC)   Pulmonary emphysema (HCC)   Cigarette smoker   Compression fracture of body of thoracic vertebra (HCC)   Hx of deep venous thrombosis   Hx pulmonary embolism   Protein-calorie malnutrition (HCC)   Protein-calorie malnutrition, severe   Hyponatremia    apixaban  5 mg Oral BID   atorvastatin  80 mg Oral QHS   carvedilol  3.125 mg Oral BID WC   Chlorhexidine Gluconate  Cloth  6 each Topical Daily   famotidine  20 mg Oral Daily   feeding supplement  237 mL Oral BID BM   multivitamin with minerals  1 tablet Oral Daily   saccharomyces boulardii  250 mg Oral BID   spironolactone  25 mg Oral Daily   tolvaptan  15 mg Oral Once    SUBJECTIVE: Back and hip pain predominate today. He thinks the hip is infected.    Review of Systems: Review of Systems  Constitutional:  Negative for chills and fever.  Musculoskeletal:  Positive for back pain and joint pain.  Skin:  Negative for rash.    Allergies  Allergen Reactions   Fentanyl Itching   Hydromorphone Hcl Itching     Pt  States itching all over Pt  States itching all over Pt  States itching all over    Morphine Itching   Pantoprazole Hives   Pantoprazole Sodium Hives   Haloperidol Other (See Comments)    Pt reports he has "seizures" had haldol on 09/25/21 with no apparent ill effect.  Pt reports he has "seizures"   Nsaids Nausea And Vomiting, Other (See Comments) and Hives    Severe cramping Other reaction(s): Abdominal Pain Cramping per [place]   Morphine And Codeine Itching   Ibuprofen    Ketorolac Other (See Comments)   Pantoprazole Sodium Hives    OBJECTIVE: Vitals:   12/12/23 1641 12/12/23 2015 12/12/23 2340 12/13/23 0908  BP: 96/67 97/63 111/66 114/71  Pulse: 94 74 80 80  Resp: 18 19 17 18   Temp: 98.1 F (36.7 C) 98.5 F (36.9 C) 98.2 F (36.8 C) 98.1 F (36.7 C)  TempSrc: Oral Oral Oral Oral  SpO2: 97% 98% 96% 99%  Weight:      Height:       Body mass index is 15.85 kg/m.  Physical Exam Constitutional:      General: He is not in acute distress.    Appearance: He is well-developed. He is ill-appearing. He is not toxic-appearing.  Cardiovascular:     Rate and Rhythm: Normal rate.  Pulmonary:     Effort: Pulmonary effort is normal.     Breath sounds: Normal breath sounds.  Skin:    General: Skin is warm and dry.     Capillary Refill: Capillary refill takes less than 2 seconds.  Neurological:     Mental Status: He is alert and oriented to person, place, and time.     Lab Results Lab Results  Component Value Date   WBC 6.9 12/13/2023   HGB 9.1 (L) 12/13/2023   HCT 28.2 (L) 12/13/2023   MCV 88.1 12/13/2023   PLT 318 12/13/2023    Lab Results  Component Value Date   CREATININE 0.81 12/13/2023   BUN 20 12/13/2023   NA 127 (L) 12/13/2023   K 4.1 12/13/2023   CL 95 (L) 12/13/2023   CO2 25 12/13/2023    Lab Results  Component Value Date   ALT 17 12/13/2023   AST 21 12/13/2023   ALKPHOS 59 12/13/2023   BILITOT 0.6 12/13/2023     Microbiology: Recent  Results (from the past 240 hours)  Culture, blood (Routine x 2)     Status: None   Collection Time: 12/06/23  8:28 PM   Specimen: BLOOD LEFT FOREARM  Result Value Ref Range Status   Specimen Description BLOOD LEFT FOREARM  Final   Special Requests   Final    BOTTLES DRAWN AEROBIC ONLY Blood Culture results  may not be optimal due to an inadequate volume of blood received in culture bottles   Culture   Final    NO GROWTH 5 DAYS Performed at [facility], [address], [place], [place] [address]    Report Status 12/11/2023 FINAL  Final  Culture, blood (Routine x 2)     Status: None   Collection Time: 12/06/23  8:33 PM   Specimen: BLOOD RIGHT FOREARM  Result Value Ref Range Status   Specimen Description BLOOD RIGHT FOREARM  Final   Special Requests   Final    BOTTLES DRAWN AEROBIC AND ANAEROBIC Blood Culture results may not be optimal due to an inadequate volume of blood received in culture bottles   Culture   Final    NO GROWTH 5 DAYS Performed at [facility], [address] [place], [place] [address]    Report Status 12/11/2023 FINAL  Final     Rexene Alberts, MSN, NP-C [facility]  [email or web address] Pager: [phone] Office: [phone] [facility] Main Line: [phone] *Secure Chat Communication Welcome

## 2023-12-14 DIAGNOSIS — R7881 Bacteremia: Secondary | ICD-10-CM | POA: Diagnosis not present

## 2023-12-14 DIAGNOSIS — I5042 Chronic combined systolic (congestive) and diastolic (congestive) heart failure: Secondary | ICD-10-CM | POA: Diagnosis not present

## 2023-12-14 DIAGNOSIS — B9562 Methicillin resistant Staphylococcus aureus infection as the cause of diseases classified elsewhere: Secondary | ICD-10-CM | POA: Diagnosis not present

## 2023-12-14 LAB — COMPREHENSIVE METABOLIC PANEL WITH GFR
ALT: 17 U/L (ref 0–44)
AST: 21 U/L (ref 15–41)
Albumin: 2 g/dL — ABNORMAL LOW (ref 3.5–5.0)
Alkaline Phosphatase: 67 U/L (ref 38–126)
Anion gap: 10 (ref 5–15)
BUN: 17 mg/dL (ref 8–23)
CO2: 24 mmol/L (ref 22–32)
Calcium: 8.8 mg/dL — ABNORMAL LOW (ref 8.9–10.3)
Chloride: 98 mmol/L (ref 98–111)
Creatinine, Ser: 0.87 mg/dL (ref 0.61–1.24)
GFR, Estimated: >60 mL/min (ref >60)
Glucose, Bld: 103 mg/dL — ABNORMAL HIGH (ref 70–99)
Potassium: 4 mmol/L (ref 3.5–5.1)
Sodium: 132 mmol/L — ABNORMAL LOW (ref 135–145)
Total Bilirubin: 0.5 mg/dL (ref 0.0–1.2)
Total Protein: 7.3 g/dL (ref 6.5–8.1)

## 2023-12-14 LAB — CBC WITH DIFFERENTIAL/PLATELET
Abs Immature Granulocytes: 0.04 10*3/uL (ref 0.00–0.07)
Basophils Absolute: 0 10*3/uL (ref 0.0–0.1)
Basophils Relative: 0 %
Eosinophils Absolute: 0.3 10*3/uL (ref 0.0–0.5)
Eosinophils Relative: 4 %
HCT: 29.5 % — ABNORMAL LOW (ref 39.0–52.0)
Hemoglobin: 9.6 g/dL — ABNORMAL LOW (ref 13.0–17.0)
Immature Granulocytes: 1 %
Lymphocytes Relative: 14 %
Lymphs Abs: 1 10*3/uL (ref 0.7–4.0)
MCH: 28.6 pg (ref 26.0–34.0)
MCHC: 32.5 g/dL (ref 30.0–36.0)
MCV: 87.8 fL (ref 80.0–100.0)
Monocytes Absolute: 0.7 10*3/uL (ref 0.1–1.0)
Monocytes Relative: 10 %
Neutro Abs: 4.9 10*3/uL (ref 1.7–7.7)
Neutrophils Relative %: 71 %
Platelets: 327 10*3/uL (ref 150–400)
RBC: 3.36 MIL/uL — ABNORMAL LOW (ref 4.22–5.81)
RDW: 15.9 % — ABNORMAL HIGH (ref 11.5–15.5)
WBC: 6.8 10*3/uL (ref 4.0–10.5)
nRBC: 0 % (ref 0.0–0.2)

## 2023-12-14 LAB — MAGNESIUM: Magnesium: 1.8 mg/dL (ref 1.7–2.4)

## 2023-12-14 LAB — C-REACTIVE PROTEIN: CRP: 2.7 mg/dL — ABNORMAL HIGH (ref <1.0)

## 2023-12-14 LAB — UREA NITROGEN, URINE: Urea Nitrogen, Ur: 750 mg/dL

## 2023-12-14 LAB — PROCALCITONIN: Procalcitonin: <0.1 ng/mL

## 2023-12-14 NOTE — Progress Notes (Signed)
 PROGRESS NOTE                                                                                                                                                                                                             Patient Demographics:    [name] [name], is a 63 y.o. male, DOB - 04-16-1961, ION:[id number]  Outpatient Primary MD for the patient is Patient, No Pcp Per    LOS - 7  Admit date - 12/06/2023    Chief Complaint  Patient presents with   Abdominal Pain       Brief Narrative (HPI from H&P)    63 y.o. 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   He did not want to go back to [facility], he was also due for a TEE and was admitted to the hospital here for further care.   Subjective:   Patient in bed, appears comfortable, denies any headache, no fever, no chest pain or pressure, no shortness of breath , no abdominal pain. No focal weakness.   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  Being treated for MRSA bacteremia through IV vancomycin under the guidance of cardiology and ID.  Per neurosurgeon Dr. Conchita Paris C2-C3 osteomyelitis requires medical treatment only, orthopedics following him, IR tried to aspirate his right hip abscess and fluid collection but patient refused.  He has refused multiple tests and procedures this admission despite counseling.  For now continue IV Vancomycin with stop date 01/17/2024 via PICC  line, he is homeless, IV drug user, not compliant with appointments and ID feels that prolonged IV antibiotics in the hospital is the safest course for now.  Of note patient was to get Dalbavancin prior to [facility] hospital discharge from [facility] but appears that he never received it.    C2-C3 osteomyelitis, lower back pain, compression fractures in lumbar spine vertebrae, T12, L2-L3 L5 - repeat MRI L-spine this admission stable, supportive care.  No signs of acute infection, C-spine MRI changes discussed with Dr. Conchita Paris, medical treatment.  Supportive care for brain, history of narcotic seeking behavior.   Lactic acidosis - due to dehydration.  Has been hydrated.   Chronic HFrEF - LVEF 20 to 25%, cardiology following.  Continue on Aldactone further diuretics per cardiology, euvolemic currently.   Possible acute pyelonephritis seen on CT scan, POA  - Continue IV antibiotics, As needed analgesics, follow urine culture.   History of PE/DVT on Eliquis  Resume home Eliquis also has IVC filter.   History of polysubstance abuse  Denies recent use of IV drugs   Tobacco use disorder  - Current smoker, Nicotine patch   Generalized weakness  PT OT assessment, Fall precautions   Severe protein calorie malnutrition - Dietitian consult, protein supplementation  Hyponatremia.  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  No acute issues.  Monitor  No IV access.  PICC line  placed 12/09/2023 after much counseling       Condition - Extremely Guarded  Family Communication  :  None present  Code Status :   Full  Consults  :  ID, Cards, Dr. Conchita Paris Neurosurgery, orthopedics, IR  PUD Prophylaxis :  Pepcid   Procedures  :     PICC line placed 12/09/2023   CT R Hip - 1. Status post total right hip arthroplasty. 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 2. There is complex joint fluid inferior to the right femoral neck prosthesis with mild-to-moderate joint capsule thickening. This fluid extends into the right obturator externus muscle and into the right iliopsoas bursa. There also appears to be posterior extension of joint fluid into a fluid collection wrapping around the posterior aspect of the right greater trochanter and extending superiorly into the right gluteus medius muscle. This is suspicious for right humerus however septic arthritis with the infected joint fluid extending peripherally. 3. Moderate right gluteus minimus and medius muscle edema. Focal edema within the anterior aspect of the left gluteus medius muscle. 4. There is metallic susceptibility artifact in the region of the posterior right hip/buttock. Recommend clinical correlation for possible surgical skin staples in this region.   TTE - 1. Left ventricular ejection fraction, by estimation, is 20 to 25%. The left ventricle has severely decreased function. The left ventricle demonstrates global hypokinesis. The left ventricular internal cavity size was mildly dilated. Left ventricular diastolic parameters are consistent with Grade I diastolic dysfunction (impaired relaxation). No LV thrombus noted.  2. Right ventricular systolic function is mildly reduced. The right ventricular size is normal.  Tricuspid regurgitation signal is inadequate for assessing PA pressure.  3. The mitral valve is normal in structure. No evidence of mitral valve regurgitation. No evidence of mitral stenosis. Moderate mitral annular calcification.  4. The aortic valve was not  well visualized. Aortic valve regurgitation is not visualized. No aortic stenosis is present.  5. IVC not visualized.  MRI brain.  Nonacute.    MRI C-spine.   Severely motion limited noncontrast study. Significant marrow edema surrounding the left C2-C3 facet joint and extending to involve the posterior left C2 and C3 vertebral bodies with surrounding paraspinal edema, concerning for septic arthritis and possibly discitis/osteomyelitis given the reported clinical concern for infection. A repeat MRI of the cervical spine with contrast is recommended when the patient is able (possibly with sedation) to better evaluate.  MRI L-spine.  1. Chronic compression fractures of T12, L2, L3, and L5 with no complicating features. 2. Subtle and nonspecific bilateral erector spinae muscle edema. A similar appearance can be seen in chronically debilitated patients. 3. 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 4. Capacious lumbar spinal canal with no spinal stenosis  CT scan abdomen pelvis.1. Emphysematous changes and chronic bronchitic changes in the lung bases. Trace bilateral pleural effusions. 2. Heterogeneous nephrograms bilaterally suggesting acute pyelonephritis. No abscess or hydronephrosis. 3. Moderate-sized right inguinal hernia containing bowel without obvious proximal obstruction. Visualization is limited due to streak artifact. 4. Aortic atherosclerosis.  Inferior vena caval filter. 5. Small amount of free fluid in the abdomen of nonspecific etiology      Disposition Plan  :    Status is: Inpatient   DVT Prophylaxis  :     apixaban  (ELIQUIS) tablet 5 mg     Lab Results  Component Value Date   PLT 327 12/14/2023    Diet :  Diet Order             Diet regular Fluid consistency: Thin  Diet effective now                    Inpatient Medications  Scheduled Meds:  apixaban  5 mg Oral BID   atorvastatin  80 mg Oral QHS   carvedilol  3.125 mg Oral BID WC   Chlorhexidine Gluconate Cloth  6 each Topical Daily   famotidine  20 mg Oral Daily   feeding supplement  237 mL Oral BID BM   multivitamin with minerals  1 tablet Oral Daily   saccharomyces boulardii  250 mg Oral BID   spironolactone  25 mg Oral Daily   Continuous Infusions:  vancomycin 1,250 mg (12/13/23 1401)   PRN Meds:.acetaminophen, HYDROmorphone (DILAUDID) injection, melatonin, naLOXone (NARCAN)  injection, oxyCODONE, polyethylene glycol, prochlorperazine, sodium chloride flush     Objective:   Vitals:   12/14/23 0000 12/14/23 0400 12/14/23 0500 12/14/23 0825  BP: 120/71 128/74    Pulse: 81 71  77  Resp: 19 17    Temp: 98.7 F (37.1 C) 98.5 F (36.9 C)    TempSrc: Oral Oral    SpO2: 95% 95%    Weight:   54.1 kg   Height:        Wt Readings from Last 3 Encounters:  12/14/23 54.1 kg  10/23/18 72.6 kg  09/09/18 70.3 kg     Intake/Output Summary (Last 24 hours) at 12/14/2023 0920 Last data filed at 12/14/2023 0700 Gross per 24 hour  Intake --  Output 650 ml  Net -650 ml      Physical Exam  Awake Alert, No new F.N deficits, Normal affect [place].AT,PERRAL Supple Neck, No JVD,   Symmetrical Chest wall movement, Good air movement bilaterally,  CTAB RRR  +ve B.Sounds, Abd Soft, No tenderness,   No Cyanosis, Clubbing or edema      Data Review:    Recent Labs  Lab 12/10/23 0449 12/11/23 0314 12/12/23 0330 12/13/23 0403 12/14/23 0305  WBC 6.8 9.1 7.2 6.9 6.8  HGB 9.7* 10.5* 9.2* 9.1* 9.6*  HCT 29.5* 32.1* 28.0* 28.2* 29.5*  PLT 392 451* 353 318 327  MCV 87.3 86.8 87.0 88.1 87.8  MCH 28.7 28.4 28.6 28.4 28.6  MCHC  32.9 32.7 32.9 32.3 32.5  RDW 16.1* 16.2* 16.0* 15.9* 15.9*  LYMPHSABS 0.8 0.9 0.8 0.9 1.0  MONOABS 0.6 0.4 0.6 0.7 0.7  EOSABS 0.2 0.1 0.2 0.2 0.3  BASOSABS 0.0 0.0 0.0 0.0 0.0    Recent Labs  Lab 12/07/23 1500 12/08/23 0522 12/08/23 0522 12/08/23 0556 12/09/23 0533 12/10/23 0449 12/11/23 0314 12/12/23 0330 12/13/23 0403 12/14/23 0305  NA  --  126*   < >  --  129* 128* 128* 125* 127* 132*  K  --  3.9   < >  --  3.8 3.8 3.8 3.6 4.1 4.0  CL  --  97*   < >  --  93* 92* 92* 89* 95* 98  CO2  --  23   < >  --  27 26 26 26 25 24   ANIONGAP  --  6   < >  --  9 10 10 10 7 10   GLUCOSE  --  91   < >  --  91 100* 98 99 98 103*  BUN  --  10   < >  --  14 20 30* 27* 20 17  CREATININE  --  0.78   < >  --  1.04 1.18 1.27* 0.98 0.81 0.87  AST  --   --    < >  --  31 26 19 20 21 21   ALT  --   --    < >  --  25 23 19 16 17 17   ALKPHOS  --   --    < >  --  72 58 66 63 59 67  BILITOT  --   --    < >  --  0.6 0.5 0.8 0.9 0.6 0.5  ALBUMIN  --   --    < >  --  2.3* 2.1* 2.0* 1.9* 1.9* 2.0*  CRP  --   --    < > 5.9* 5.5* 3.6* 4.4* 2.9* 2.7* 2.7*  PROCALCITON  --  <0.10  --   --  <0.10  --   --   --  <0.10 <0.10  LATICACIDVEN 1.6  --   --   --   --   --   --   --   --   --   BNP  --   --   --  965.0* 424.6* 154.1* 236.1* 178.0*  --   --   MG  --  1.6*   < >  --  1.8 1.5* 2.4 1.7 1.7 1.8  CALCIUM  --  8.5*   < >  --  9.2 8.8* 8.8* 8.4* 8.4* 8.8*   < > = values in this interval not displayed.      Recent Labs  Lab 12/07/23 1500 12/08/23 0522 12/08/23 0522 12/08/23 0556 12/09/23 0533 12/10/23 0449 12/11/23 0314 12/12/23 0330 12/13/23 0403 12/14/23 0305  CRP  --   --    < > 5.9* 5.5* 3.6* 4.4* 2.9* 2.7*  2.7*  PROCALCITON  --  <0.10  --   --  <0.10  --   --   --  <0.10 <0.10  LATICACIDVEN 1.6  --   --   --   --   --   --   --   --   --   BNP  --   --   --  965.0* 424.6* 154.1* 236.1* 178.0*  --   --   MG  --  1.6*   < >  --  1.8 1.5* 2.4 1.7 1.7 1.8  CALCIUM  --  8.5*   < >  --  9.2 8.8*  8.8* 8.4* 8.4* 8.8*   < > = values in this interval not displayed.    --------------------------------------------------------------------------------------------------------------- Lab Results  Component Value Date   CHOL 114 12/09/2023   HDL 43 12/09/2023   LDLCALC 59 12/09/2023   TRIG 59 12/09/2023   CHOLHDL 2.7 12/09/2023    No results found for: "HGBA1C" No results for input(s): "TSH", "T4TOTAL", "FREET4", "T3FREE", "THYROIDAB" in the last 72 hours. No results for input(s): "VITAMINB12", "FOLATE", "FERRITIN", "TIBC", "IRON", "RETICCTPCT" in the last 72 hours. ------------------------------------------------------------------------------------------------------------------ Cardiac Enzymes No results for input(s): "CKMB", "TROPONINI", "MYOGLOBIN" in the last 168 hours.  Invalid input(s): "CK"  Micro Results Recent Results (from the past 240 hours)  Culture, blood (Routine x 2)     Status: None   Collection Time: 12/06/23  8:28 PM   Specimen: BLOOD LEFT FOREARM  Result Value Ref Range Status   Specimen Description BLOOD LEFT FOREARM  Final   Special Requests   Final    BOTTLES DRAWN AEROBIC ONLY Blood Culture results may not be optimal due to an inadequate volume of blood received in culture bottles   Culture   Final    NO GROWTH 5 DAYS Performed at [facility], [address], [place], [place] [address]    Report Status 12/11/2023 FINAL  Final  Culture, blood (Routine x 2)     Status: None   Collection Time: 12/06/23  8:33 PM   Specimen: BLOOD RIGHT FOREARM  Result Value Ref Range Status   Specimen Description BLOOD RIGHT FOREARM  Final   Special Requests   Final    BOTTLES DRAWN AEROBIC AND ANAEROBIC Blood Culture results may not be optimal due to an inadequate volume of blood received in culture bottles   Culture   Final    NO GROWTH 5 DAYS Performed at [facility], [address] [place], [place] [address]    Report Status 12/11/2023 FINAL  Final     Radiology Report No results found.    Signature  -   Susa Raring M.D on 12/14/2023 at 9:20 AM   -  To page go to [email or web address]

## 2023-12-15 DIAGNOSIS — B9562 Methicillin resistant Staphylococcus aureus infection as the cause of diseases classified elsewhere: Secondary | ICD-10-CM | POA: Diagnosis not present

## 2023-12-15 DIAGNOSIS — I5042 Chronic combined systolic (congestive) and diastolic (congestive) heart failure: Secondary | ICD-10-CM | POA: Diagnosis not present

## 2023-12-15 DIAGNOSIS — R7881 Bacteremia: Secondary | ICD-10-CM | POA: Diagnosis not present

## 2023-12-15 LAB — PROCALCITONIN: Procalcitonin: <0.1 ng/mL

## 2023-12-15 LAB — COMPREHENSIVE METABOLIC PANEL WITH GFR
ALT: 17 U/L (ref 0–44)
AST: 22 U/L (ref 15–41)
Albumin: 2 g/dL — ABNORMAL LOW (ref 3.5–5.0)
Alkaline Phosphatase: 67 U/L (ref 38–126)
Anion gap: 10 (ref 5–15)
BUN: 20 mg/dL (ref 8–23)
CO2: 25 mmol/L (ref 22–32)
Calcium: 8.8 mg/dL — ABNORMAL LOW (ref 8.9–10.3)
Chloride: 94 mmol/L — ABNORMAL LOW (ref 98–111)
Creatinine, Ser: 0.86 mg/dL (ref 0.61–1.24)
GFR, Estimated: >60 mL/min (ref >60)
Glucose, Bld: 97 mg/dL (ref 70–99)
Potassium: 4.3 mmol/L (ref 3.5–5.1)
Sodium: 129 mmol/L — ABNORMAL LOW (ref 135–145)
Total Bilirubin: 0.7 mg/dL (ref 0.0–1.2)
Total Protein: 7.3 g/dL (ref 6.5–8.1)

## 2023-12-15 LAB — CBC WITH DIFFERENTIAL/PLATELET
Abs Immature Granulocytes: 0.04 10*3/uL (ref 0.00–0.07)
Basophils Absolute: 0.1 10*3/uL (ref 0.0–0.1)
Basophils Relative: 1 %
Eosinophils Absolute: 0.3 10*3/uL (ref 0.0–0.5)
Eosinophils Relative: 4 %
HCT: 30 % — ABNORMAL LOW (ref 39.0–52.0)
Hemoglobin: 9.6 g/dL — ABNORMAL LOW (ref 13.0–17.0)
Immature Granulocytes: 1 %
Lymphocytes Relative: 12 %
Lymphs Abs: 1 10*3/uL (ref 0.7–4.0)
MCH: 28.2 pg (ref 26.0–34.0)
MCHC: 32 g/dL (ref 30.0–36.0)
MCV: 88 fL (ref 80.0–100.0)
Monocytes Absolute: 0.7 10*3/uL (ref 0.1–1.0)
Monocytes Relative: 9 %
Neutro Abs: 5.6 10*3/uL (ref 1.7–7.7)
Neutrophils Relative %: 73 %
Platelets: 321 10*3/uL (ref 150–400)
RBC: 3.41 MIL/uL — ABNORMAL LOW (ref 4.22–5.81)
RDW: 15.9 % — ABNORMAL HIGH (ref 11.5–15.5)
WBC: 7.7 10*3/uL (ref 4.0–10.5)
nRBC: 0 % (ref 0.0–0.2)

## 2023-12-15 LAB — MAGNESIUM: Magnesium: 1.7 mg/dL (ref 1.7–2.4)

## 2023-12-15 LAB — C-REACTIVE PROTEIN: CRP: 2.2 mg/dL — ABNORMAL HIGH (ref <1.0)

## 2023-12-15 NOTE — Progress Notes (Signed)
 PROGRESS NOTE                                                                                                                                                                                                             Patient Demographics:    [name] [name], is a 63 y.o. male, DOB - March 27, 1961, ZOX:[id number]  Outpatient Primary MD for the patient is Patient, No Pcp Per    LOS - 8  Admit date - 12/06/2023    Chief Complaint  Patient presents with   Abdominal Pain       Brief Narrative (HPI from H&P)    63 y.o. 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   He did not want to go back to [facility], he was also due for a TEE and was admitted to the hospital here for further care.   Subjective:   Patient in bed, appears comfortable, denies any headache, no fever, no chest pain or pressure, no shortness of breath , no abdominal pain. No focal weakness.   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  Being treated for MRSA bacteremia through IV vancomycin under the guidance of cardiology and ID.  Per neurosurgeon Dr. Conchita Paris C2-C3 osteomyelitis requires medical treatment only, orthopedics following him, IR tried to aspirate his right hip abscess and fluid collection but patient refused.  He has refused multiple tests and procedures this admission despite counseling.  For now continue IV Vancomycin with stop date 01/17/2024 via PICC  line, he is homeless, IV drug user, not compliant with appointments and ID feels that prolonged IV antibiotics in the hospital is the safest course for now.  Of note patient was to get Dalbavancin prior to his hospital discharge [facility] but appears that he never received it.    C2-C3 osteomyelitis, lower back pain, compression fractures in lumbar spine vertebrae, T12, L2-L3 L5 - repeat MRI L-spine this admission stable, supportive care.  No signs of acute infection, C-spine MRI changes discussed with Dr. Conchita Paris, medical treatment.  Supportive care for brain, history of narcotic seeking behavior.   Lactic acidosis - due to dehydration.  Has been hydrated.   Chronic HFrEF - LVEF 20 to 25%, cardiology following.  Continue on Aldactone further diuretics per cardiology, euvolemic currently.   Possible acute pyelonephritis seen on CT scan, POA  - Continue IV antibiotics, As needed analgesics, negative urine culture.   History of PE/DVT on Eliquis  Resume home Eliquis also has IVC filter.   History of polysubstance abuse  Denies recent use of IV drugs   Tobacco use disorder  - Current smoker, Nicotine patch   Generalized weakness  PT OT assessment, Fall precautions   Severe protein calorie malnutrition - Dietitian consult, protein supplementation  Hyponatremia.  SIADH, received a dose of Samsca with good effect, counseled on fluid restriction, he is not compliant.  Incidental finding of right inguinal hernia.  No acute issues.  Monitor  No IV  access.  PICC line placed 12/09/2023 after much counseling       Condition - Extremely Guarded  Family Communication  :  None present  Code Status :   Full  Consults  :  ID, Cards, Dr. Conchita Paris Neurosurgery, orthopedics, IR  PUD Prophylaxis :  Pepcid   Procedures  :     PICC line placed 12/09/2023   CT R Hip - 1. Status post total right hip arthroplasty. 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 2. There is complex joint fluid inferior to the right femoral neck prosthesis with mild-to-moderate joint capsule thickening. This fluid extends into the right obturator externus muscle and into the right iliopsoas bursa. There also appears to be posterior extension of joint fluid into a fluid collection wrapping around the posterior aspect of the right greater trochanter and extending superiorly into the right gluteus medius muscle. This is suspicious for right humerus however septic arthritis with the infected joint fluid extending peripherally. 3. Moderate right gluteus minimus and medius muscle edema. Focal edema within the anterior aspect of the left gluteus medius muscle. 4. There is metallic susceptibility artifact in the region of the posterior right hip/buttock. Recommend clinical correlation for possible surgical skin staples in this region.   TTE - 1. Left ventricular ejection fraction, by estimation, is 20 to 25%. The left ventricle has severely decreased function. The left ventricle demonstrates global hypokinesis. The left ventricular internal cavity size was mildly dilated. Left ventricular diastolic parameters are consistent with Grade I diastolic dysfunction (impaired relaxation). No LV thrombus noted.  2. Right ventricular systolic function is mildly reduced. The right ventricular  size is normal. Tricuspid regurgitation signal is inadequate for assessing PA pressure.  3. The mitral valve is normal in structure. No evidence of mitral valve regurgitation. No evidence of mitral stenosis. Moderate mitral annular calcification.  4. The  aortic valve was not well visualized. Aortic valve regurgitation is not visualized. No aortic stenosis is present.  5. IVC not visualized.  MRI brain.  Nonacute.    MRI C-spine.   Severely motion limited noncontrast study. Significant marrow edema surrounding the left C2-C3 facet joint and extending to involve the posterior left C2 and C3 vertebral bodies with surrounding paraspinal edema, concerning for septic arthritis and possibly discitis/osteomyelitis given the reported clinical concern for infection. A repeat MRI of the cervical spine with contrast is recommended when the patient is able (possibly with sedation) to better evaluate.  MRI L-spine.  1. Chronic compression fractures of T12, L2, L3, and L5 with no complicating features. 2. Subtle and nonspecific bilateral erector spinae muscle edema. A similar appearance can be seen in chronically debilitated patients. 3. 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 4. Capacious lumbar spinal canal with no spinal stenosis  CT scan abdomen pelvis.1. Emphysematous changes and chronic bronchitic changes in the lung bases. Trace bilateral pleural effusions. 2. Heterogeneous nephrograms bilaterally suggesting acute pyelonephritis. No abscess or hydronephrosis. 3. Moderate-sized right inguinal hernia containing bowel without obvious proximal obstruction. Visualization is limited due to streak artifact. 4. Aortic atherosclerosis.  Inferior vena caval filter. 5. Small amount of free fluid in the abdomen of nonspecific etiology      Disposition Plan  :    Status is: Inpatient   DVT Prophylaxis  :      apixaban (ELIQUIS) tablet 5 mg     Lab Results  Component Value Date   PLT 321 12/15/2023    Diet :  Diet Order             Diet regular Fluid consistency: Thin; Fluid restriction: 1200 mL Fluid  Diet effective now                    Inpatient Medications  Scheduled Meds:  apixaban  5 mg Oral BID   atorvastatin  80 mg Oral QHS   carvedilol  3.125 mg Oral BID WC   Chlorhexidine Gluconate Cloth  6 each Topical Daily   famotidine  20 mg Oral Daily   feeding supplement  237 mL Oral BID BM   multivitamin with minerals  1 tablet Oral Daily   saccharomyces boulardii  250 mg Oral BID   spironolactone  25 mg Oral Daily   Continuous Infusions:  vancomycin 166.7 mL/hr at 12/14/23 1800   PRN Meds:.acetaminophen, HYDROmorphone (DILAUDID) injection, melatonin, naLOXone (NARCAN)  injection, oxyCODONE, polyethylene glycol, prochlorperazine, sodium chloride flush     Objective:   Vitals:   12/14/23 1716 12/14/23 1949 12/15/23 0400 12/15/23 0705  BP:  123/80 123/80   Pulse: 91 80    Resp:   20   Temp:  97.9 F (36.6 C) 97.8 F (36.6 C)   TempSrc:  Oral Oral   SpO2:    96%  Weight:      Height:        Wt Readings from Last 3 Encounters:  12/14/23 54.1 kg  10/23/18 72.6 kg  09/09/18 70.3 kg     Intake/Output Summary (Last 24 hours) at 12/15/2023 0833 Last data filed at 12/15/2023 0200 Gross per 24 hour  Intake 2308 ml  Output 1400 ml  Net 908 ml      Physical Exam  Awake Alert, No new F.N deficits, Normal affect [place].AT,PERRAL Supple Neck, No JVD,  Symmetrical Chest wall movement, Good air movement bilaterally, CTAB RRR  +ve B.Sounds, Abd Soft, No tenderness,   No Cyanosis, Clubbing or edema      Data Review:    Recent Labs  Lab 12/11/23 0314 12/12/23 0330 12/13/23 0403 12/14/23 0305 12/15/23 0300  WBC 9.1 7.2 6.9 6.8 7.7  HGB 10.5* 9.2* 9.1* 9.6* 9.6*  HCT 32.1* 28.0* 28.2* 29.5* 30.0*  PLT 451* 353 318 327 321  MCV 86.8 87.0 88.1 87.8  88.0  MCH 28.4 28.6 28.4 28.6 28.2  MCHC 32.7 32.9 32.3 32.5 32.0  RDW 16.2* 16.0* 15.9* 15.9* 15.9*  LYMPHSABS 0.9 0.8 0.9 1.0 1.0  MONOABS 0.4 0.6 0.7 0.7 0.7  EOSABS 0.1 0.2 0.2 0.3 0.3  BASOSABS 0.0 0.0 0.0 0.0 0.1    Recent Labs  Lab 12/09/23 0533 12/10/23 0449 12/11/23 0314 12/12/23 0330 12/13/23 0403 12/14/23 0305 12/15/23 0300  NA 129* 128* 128* 125* 127* 132* 129*  K 3.8 3.8 3.8 3.6 4.1 4.0 4.3  CL 93* 92* 92* 89* 95* 98 94*  CO2 27 26 26 26 25 24 25   ANIONGAP 9 10 10 10 7 10 10   GLUCOSE 91 100* 98 99 98 103* 97  BUN 14 20 30* 27* 20 17 20   CREATININE 1.04 1.18 1.27* 0.98 0.81 0.87 0.86  AST 31 26 19 20 21 21 22   ALT 25 23 19 16 17 17 17   ALKPHOS 72 58 66 63 59 67 67  BILITOT 0.6 0.5 0.8 0.9 0.6 0.5 0.7  ALBUMIN 2.3* 2.1* 2.0* 1.9* 1.9* 2.0* 2.0*  CRP 5.5* 3.6* 4.4* 2.9* 2.7* 2.7* 2.2*  PROCALCITON <0.10  --   --   --  <0.10 <0.10 <0.10  BNP 424.6* 154.1* 236.1* 178.0*  --   --   --   MG 1.8 1.5* 2.4 1.7 1.7 1.8 1.7  CALCIUM 9.2 8.8* 8.8* 8.4* 8.4* 8.8* 8.8*      Recent Labs  Lab 12/09/23 0533 12/10/23 0449 12/11/23 0314 12/12/23 0330 12/13/23 0403 12/14/23 0305 12/15/23 0300  CRP 5.5* 3.6* 4.4* 2.9* 2.7* 2.7* 2.2*  PROCALCITON <0.10  --   --   --  <0.10 <0.10 <0.10  BNP 424.6* 154.1* 236.1* 178.0*  --   --   --   MG 1.8 1.5* 2.4 1.7 1.7 1.8 1.7  CALCIUM 9.2 8.8* 8.8* 8.4* 8.4* 8.8* 8.8*    --------------------------------------------------------------------------------------------------------------- Lab Results  Component Value Date   CHOL 114 12/09/2023   HDL 43 12/09/2023   LDLCALC 59 12/09/2023   TRIG 59 12/09/2023   CHOLHDL 2.7 12/09/2023    No results found for: "HGBA1C" No results for input(s): "TSH", "T4TOTAL", "FREET4", "T3FREE", "THYROIDAB" in the last 72 hours. No results for input(s): "VITAMINB12", "FOLATE", "FERRITIN", "TIBC", "IRON", "RETICCTPCT" in the last 72  hours. ------------------------------------------------------------------------------------------------------------------ Cardiac Enzymes No results for input(s): "CKMB", "TROPONINI", "MYOGLOBIN" in the last 168 hours.  Invalid input(s): "CK"  Micro Results Recent Results (from the past 240 hours)  Culture, blood (Routine x 2)     Status: None   Collection Time: 12/06/23  8:28 PM   Specimen: BLOOD LEFT FOREARM  Result Value Ref Range Status   Specimen Description BLOOD LEFT FOREARM  Final   Special Requests   Final    BOTTLES DRAWN AEROBIC ONLY Blood Culture results may not be optimal due to an inadequate volume of blood received in culture bottles   Culture   Final    NO GROWTH 5 DAYS Performed at [facility], [address], [place], [place] [address]    Report Status 12/11/2023 FINAL  Final  Culture, blood (Routine x 2)     Status: None   Collection Time: 12/06/23  8:33 PM   Specimen: BLOOD RIGHT FOREARM  Result Value Ref Range Status   Specimen Description BLOOD RIGHT FOREARM  Final   Special Requests   Final    BOTTLES DRAWN AEROBIC AND ANAEROBIC Blood Culture results may not be optimal due to an inadequate volume of blood received in culture bottles   Culture   Final    NO GROWTH 5 DAYS Performed at [facility], [address] [place], [place] [address]    Report Status 12/11/2023 FINAL  Final    Radiology Report No results found.    Signature  -   Susa Raring M.D on 12/15/2023 at 8:33 AM   -  To page go to [email or web address]

## 2023-12-16 DIAGNOSIS — I76 Septic arterial embolism: Secondary | ICD-10-CM

## 2023-12-16 DIAGNOSIS — E43 Unspecified severe protein-calorie malnutrition: Secondary | ICD-10-CM

## 2023-12-16 DIAGNOSIS — B9562 Methicillin resistant Staphylococcus aureus infection as the cause of diseases classified elsewhere: Secondary | ICD-10-CM | POA: Diagnosis not present

## 2023-12-16 DIAGNOSIS — Z7189 Other specified counseling: Secondary | ICD-10-CM | POA: Diagnosis not present

## 2023-12-16 DIAGNOSIS — S32000A Wedge compression fracture of unspecified lumbar vertebra, initial encounter for closed fracture: Secondary | ICD-10-CM | POA: Diagnosis not present

## 2023-12-16 DIAGNOSIS — Z515 Encounter for palliative care: Secondary | ICD-10-CM

## 2023-12-16 DIAGNOSIS — M86351 Chronic multifocal osteomyelitis, right femur: Secondary | ICD-10-CM

## 2023-12-16 DIAGNOSIS — F1990 Other psychoactive substance use, unspecified, uncomplicated: Secondary | ICD-10-CM

## 2023-12-16 DIAGNOSIS — Z5900 Homelessness unspecified: Secondary | ICD-10-CM

## 2023-12-16 DIAGNOSIS — R7881 Bacteremia: Secondary | ICD-10-CM | POA: Diagnosis not present

## 2023-12-16 LAB — COMPREHENSIVE METABOLIC PANEL WITH GFR
ALT: 18 U/L (ref 0–44)
AST: 23 U/L (ref 15–41)
Albumin: 2 g/dL — ABNORMAL LOW (ref 3.5–5.0)
Alkaline Phosphatase: 67 U/L (ref 38–126)
Anion gap: 11 (ref 5–15)
BUN: 21 mg/dL (ref 8–23)
CO2: 24 mmol/L (ref 22–32)
Calcium: 8.8 mg/dL — ABNORMAL LOW (ref 8.9–10.3)
Chloride: 94 mmol/L — ABNORMAL LOW (ref 98–111)
Creatinine, Ser: 0.86 mg/dL (ref 0.61–1.24)
GFR, Estimated: >60 mL/min (ref >60)
Glucose, Bld: 90 mg/dL (ref 70–99)
Potassium: 4.1 mmol/L (ref 3.5–5.1)
Sodium: 129 mmol/L — ABNORMAL LOW (ref 135–145)
Total Bilirubin: 0.6 mg/dL (ref 0.0–1.2)
Total Protein: 7.6 g/dL (ref 6.5–8.1)

## 2023-12-16 LAB — CBC WITH DIFFERENTIAL/PLATELET
Abs Immature Granulocytes: 0.06 10*3/uL (ref 0.00–0.07)
Basophils Absolute: 0 10*3/uL (ref 0.0–0.1)
Basophils Relative: 1 %
Eosinophils Absolute: 0.5 10*3/uL (ref 0.0–0.5)
Eosinophils Relative: 6 %
HCT: 29.7 % — ABNORMAL LOW (ref 39.0–52.0)
Hemoglobin: 9.6 g/dL — ABNORMAL LOW (ref 13.0–17.0)
Immature Granulocytes: 1 %
Lymphocytes Relative: 15 %
Lymphs Abs: 1.2 10*3/uL (ref 0.7–4.0)
MCH: 28.7 pg (ref 26.0–34.0)
MCHC: 32.3 g/dL (ref 30.0–36.0)
MCV: 88.7 fL (ref 80.0–100.0)
Monocytes Absolute: 0.8 10*3/uL (ref 0.1–1.0)
Monocytes Relative: 10 %
Neutro Abs: 5.3 10*3/uL (ref 1.7–7.7)
Neutrophils Relative %: 67 %
Platelets: 323 10*3/uL (ref 150–400)
RBC: 3.35 MIL/uL — ABNORMAL LOW (ref 4.22–5.81)
RDW: 16.1 % — ABNORMAL HIGH (ref 11.5–15.5)
WBC: 7.8 10*3/uL (ref 4.0–10.5)
nRBC: 0 % (ref 0.0–0.2)

## 2023-12-16 LAB — C-REACTIVE PROTEIN: CRP: 2.3 mg/dL — ABNORMAL HIGH (ref <1.0)

## 2023-12-16 LAB — PROCALCITONIN: Procalcitonin: <0.1 ng/mL

## 2023-12-16 LAB — MAGNESIUM: Magnesium: 1.7 mg/dL (ref 1.7–2.4)

## 2023-12-16 MED ORDER — MAGNESIUM SULFATE 2 GM/50ML IV SOLN
2.0000 g | Freq: Once | INTRAVENOUS | Status: AC
  Administered 2023-12-16: 2 g via INTRAVENOUS
  Filled 2023-12-16: qty 50

## 2023-12-16 MED ORDER — FUROSEMIDE 40 MG PO TABS
40.0000 mg | ORAL_TABLET | Freq: Once | ORAL | Status: AC
  Administered 2023-12-16: 40 mg via ORAL
  Filled 2023-12-16: qty 1

## 2023-12-16 NOTE — Plan of Care (Signed)

## 2023-12-16 NOTE — Progress Notes (Signed)
 OT Cancellation Note  Patient Details Name: [name] [name] MRN: [id number] DOB: 11-07-1960   Cancelled Treatment:    Reason Eval/Treat Not Completed: Other (comment) Patient stating, "I dont want to do anything." Patient is aware that OT is signing off as he is not participating in therapy services. Please re-consult if patient would like to participate in OT.   Pollyann Glen E. Asli Tokarski, OTR/L Acute Rehabilitation Services [phone]   Cherlyn Cushing 12/16/2023, 9:23 AM

## 2023-12-16 NOTE — Progress Notes (Addendum)
 PROGRESS NOTE                                                                                                                                                                                                             Patient Demographics:    [name] [name], is a 63 y.o. male, DOB - 1961/06/18, MVH:[id number]  Outpatient Primary MD for the patient is Patient, No Pcp Per    LOS - 9  Admit date - 12/06/2023    Chief Complaint  Patient presents with   Abdominal Pain       Brief Narrative (HPI from H&P)    63 y.o. 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   He did not want to go back to [facility], he was also due for a TEE and was admitted to the hospital here for further care.   Subjective:   Patient in bed, appears comfortable, denies any headache, no fever, no chest pain or pressure, no shortness of breath , no abdominal pain. No focal weakness.   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  Being treated for MRSA bacteremia through IV vancomycin under the guidance of cardiology and ID.  Per neurosurgeon Dr. Conchita Paris C2-C3 osteomyelitis requires medical treatment only, orthopedics following him, IR tried to aspirate his right hip abscess and fluid collection but patient refused.  He has refused multiple tests and procedures this admission despite counseling.  Due to noncompliance long-term prognosis is not good, will request palliative  care to evaluate for goals of care.  He has unrealistic healthcare outcome expectations.  For now continue IV Vancomycin with stop date 01/17/2024 via PICC line, he is homeless, IV drug user, not compliant with appointments and ID feels that prolonged IV antibiotics in the hospital is the safest course for now.  Of note patient was to get Dalbavancin prior to [facility] but appears that he never received it.    C2-C3 osteomyelitis, lower back pain, compression fractures in lumbar spine vertebrae, T12, L2-L3 L5 - repeat MRI L-spine this admission stable, supportive care.  No signs of acute infection, C-spine MRI changes discussed with Dr. Conchita Paris, medical treatment.  Supportive care for brain, history of narcotic seeking behavior.   Lactic acidosis - due to dehydration.  Has been hydrated.   Chronic HFrEF - LVEF 20 to 25%, cardiology following.  Continue on Aldactone further diuretics per cardiology, euvolemic currently.   Possible acute pyelonephritis seen on CT scan, POA  - Continue IV antibiotics, As needed analgesics, negative urine culture.   History of PE/DVT on Eliquis  Resume home Eliquis also has IVC filter.   History of polysubstance abuse  Denies recent use of IV drugs   Tobacco use disorder  - Current smoker, Nicotine patch   Generalized weakness  PT OT assessment, Fall precautions   Severe protein calorie malnutrition - Dietitian consult, protein supplementation  Hyponatremia.  SIADH, received a  dose of Samsca with good effect, counseled on fluid restriction, he is not compliant with fluid restriction and sodium gradually trending down, counseled again.  Continue fluid restriction and monitor.  Incidental finding of right inguinal hernia.  No acute issues.  Monitor  No IV access.  PICC line placed 12/09/2023 after much counseling       Condition - Extremely Guarded  Family Communication  :  None present  Code Status :   Full  Consults  :  ID, Cards, Dr. Conchita Paris Neurosurgery, orthopedics, IR, palliative care  PUD Prophylaxis :  Pepcid   Procedures  :     PICC line placed 12/09/2023   CT R Hip - 1. Status post total right hip arthroplasty. 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 2. There is complex joint fluid inferior to the right femoral neck prosthesis with mild-to-moderate joint capsule thickening. This fluid extends into the right obturator externus muscle and into the right iliopsoas bursa. There also appears to be posterior extension of joint fluid into a fluid collection wrapping around the posterior aspect of the right greater trochanter and extending superiorly into the right gluteus medius muscle. This is suspicious for right humerus however septic arthritis with the infected joint fluid extending peripherally. 3. Moderate right gluteus minimus and medius muscle edema. Focal edema within the anterior aspect of the left gluteus medius muscle. 4. There is metallic susceptibility artifact in the region of the posterior right hip/buttock. Recommend clinical correlation for possible surgical skin staples in this region.   TTE - 1. Left ventricular ejection fraction, by estimation, is 20 to 25%. The left ventricle has severely decreased function. The left ventricle  demonstrates global hypokinesis. The left ventricular internal cavity size was mildly dilated. Left ventricular diastolic parameters are consistent with Grade I diastolic dysfunction (impaired relaxation). No LV thrombus noted.  2. Right ventricular systolic function is  mildly reduced. The right ventricular size is normal. Tricuspid regurgitation signal is inadequate for assessing PA pressure.  3. The mitral valve is normal in structure. No evidence of mitral valve regurgitation. No evidence of mitral stenosis. Moderate mitral annular calcification.  4. The aortic valve was not well visualized. Aortic valve regurgitation is not visualized. No aortic stenosis is present.  5. IVC not visualized.  MRI brain.  Nonacute.    MRI C-spine.   Severely motion limited noncontrast study. Significant marrow edema surrounding the left C2-C3 facet joint and extending to involve the posterior left C2 and C3 vertebral bodies with surrounding paraspinal edema, concerning for septic arthritis and possibly discitis/osteomyelitis given the reported clinical concern for infection. A repeat MRI of the cervical spine with contrast is recommended when the patient is able (possibly with sedation) to better evaluate.  MRI L-spine.  1. Chronic compression fractures of T12, L2, L3, and L5 with no complicating features. 2. Subtle and nonspecific bilateral erector spinae muscle edema. A similar appearance can be seen in chronically debilitated patients. 3. 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 4. Capacious lumbar spinal canal with no spinal stenosis  CT scan abdomen pelvis.1. Emphysematous changes and chronic bronchitic changes in the lung bases. Trace bilateral pleural effusions. 2. Heterogeneous nephrograms bilaterally suggesting acute pyelonephritis. No abscess or hydronephrosis. 3. Moderate-sized right inguinal hernia  containing bowel without obvious proximal obstruction. Visualization is limited due to streak artifact. 4. Aortic atherosclerosis.  Inferior vena caval filter. 5. Small amount of free fluid in the abdomen of nonspecific etiology      Disposition Plan  :    Status is: Inpatient   DVT Prophylaxis  :     apixaban (ELIQUIS) tablet 5 mg     Lab Results  Component Value Date   PLT 323 12/16/2023    Diet :  Diet Order             Diet regular Fluid consistency: Thin; Fluid restriction: 1200 mL Fluid  Diet effective now                    Inpatient Medications  Scheduled Meds:  apixaban  5 mg Oral BID   atorvastatin  80 mg Oral QHS   carvedilol  3.125 mg Oral BID WC   Chlorhexidine Gluconate Cloth  6 each Topical Daily   famotidine  20 mg Oral Daily   feeding supplement  237 mL Oral BID BM   multivitamin with minerals  1 tablet Oral Daily   saccharomyces boulardii  250 mg Oral BID   spironolactone  25 mg Oral Daily   Continuous Infusions:  vancomycin Stopped (12/15/23 1517)   PRN Meds:.acetaminophen, HYDROmorphone (DILAUDID) injection, melatonin, naLOXone (NARCAN)  injection, oxyCODONE, polyethylene glycol, prochlorperazine, sodium chloride flush     Objective:   Vitals:   12/15/23 1745 12/15/23 1934 12/15/23 2311 12/16/23 0347  BP: 115/81 104/72 115/72 111/70  Pulse: 86 79 75 81  Resp:  20    Temp:  98.1 F (36.7 C) 98.2 F (36.8 C) 98.1 F (36.7 C)  TempSrc:  Oral Oral Oral  SpO2:  96%    Weight:  54.8 kg    Height:        Wt Readings from Last 3 Encounters:  12/15/23 54.8 kg  10/23/18 72.6 kg  09/09/18 70.3 kg     Intake/Output Summary (Last 24 hours)  at 12/16/2023 0816 Last data filed at 12/15/2023 1800 Gross per 24 hour  Intake 405 ml  Output 625 ml  Net -220 ml      Physical Exam  Awake Alert, No new F.N deficits, Normal affect [place].AT,PERRAL Supple Neck, No JVD,   Symmetrical Chest wall movement, Good air movement bilaterally,  CTAB RRR  +ve B.Sounds, Abd Soft, No tenderness,   No Cyanosis, Clubbing or edema      Data Review:    Recent Labs  Lab 12/12/23 0330 12/13/23 0403 12/14/23 0305 12/15/23 0300 12/16/23 0228  WBC 7.2 6.9 6.8 7.7 7.8  HGB 9.2* 9.1* 9.6* 9.6* 9.6*  HCT 28.0* 28.2* 29.5* 30.0* 29.7*  PLT 353 318 327 321 323  MCV 87.0 88.1 87.8 88.0 88.7  MCH 28.6 28.4 28.6 28.2 28.7  MCHC 32.9 32.3 32.5 32.0 32.3  RDW 16.0* 15.9* 15.9* 15.9* 16.1*  LYMPHSABS 0.8 0.9 1.0 1.0 1.2  MONOABS 0.6 0.7 0.7 0.7 0.8  EOSABS 0.2 0.2 0.3 0.3 0.5  BASOSABS 0.0 0.0 0.0 0.1 0.0    Recent Labs  Lab 12/10/23 0449 12/11/23 0314 12/12/23 0330 12/13/23 0403 12/14/23 0305 12/15/23 0300 12/16/23 0228  NA 128* 128* 125* 127* 132* 129* 129*  K 3.8 3.8 3.6 4.1 4.0 4.3 4.1  CL 92* 92* 89* 95* 98 94* 94*  CO2 26 26 26 25 24 25 24   ANIONGAP 10 10 10 7 10 10 11   GLUCOSE 100* 98 99 98 103* 97 90  BUN 20 30* 27* 20 17 20 21   CREATININE 1.18 1.27* 0.98 0.81 0.87 0.86 0.86  AST 26 19 20 21 21 22 23   ALT 23 19 16 17 17 17 18   ALKPHOS 58 66 63 59 67 67 67  BILITOT 0.5 0.8 0.9 0.6 0.5 0.7 0.6  ALBUMIN 2.1* 2.0* 1.9* 1.9* 2.0* 2.0* 2.0*  CRP 3.6* 4.4* 2.9* 2.7* 2.7* 2.2* 2.3*  PROCALCITON  --   --   --  <0.10 <0.10 <0.10 <0.10  BNP 154.1* 236.1* 178.0*  --   --   --   --   MG 1.5* 2.4 1.7 1.7 1.8 1.7 1.7  CALCIUM 8.8* 8.8* 8.4* 8.4* 8.8* 8.8* 8.8*      Recent Labs  Lab 12/10/23 0449 12/11/23 0314 12/12/23 0330 12/13/23 0403 12/14/23 0305 12/15/23 0300 12/16/23 0228  CRP 3.6* 4.4* 2.9* 2.7* 2.7* 2.2* 2.3*  PROCALCITON  --   --   --  <0.10 <0.10 <0.10 <0.10  BNP 154.1* 236.1* 178.0*  --   --   --   --   MG 1.5* 2.4 1.7 1.7 1.8 1.7 1.7  CALCIUM 8.8* 8.8* 8.4* 8.4* 8.8* 8.8* 8.8*    --------------------------------------------------------------------------------------------------------------- Lab Results  Component Value Date   CHOL 114 12/09/2023   HDL 43 12/09/2023   LDLCALC 59 12/09/2023   TRIG  59 12/09/2023   CHOLHDL 2.7 12/09/2023    No results found for: "HGBA1C" No results for input(s): "TSH", "T4TOTAL", "FREET4", "T3FREE", "THYROIDAB" in the last 72 hours. No results for input(s): "VITAMINB12", "FOLATE", "FERRITIN", "TIBC", "IRON", "RETICCTPCT" in the last 72 hours. ------------------------------------------------------------------------------------------------------------------ Cardiac Enzymes No results for input(s): "CKMB", "TROPONINI", "MYOGLOBIN" in the last 168 hours.  Invalid input(s): "CK"  Micro Results Recent Results (from the past 240 hours)  Culture, blood (Routine x 2)     Status: None   Collection Time: 12/06/23  8:28 PM   Specimen: BLOOD LEFT FOREARM  Result Value Ref Range Status   Specimen Description BLOOD LEFT  FOREARM  Final   Special Requests   Final    BOTTLES DRAWN AEROBIC ONLY Blood Culture results may not be optimal due to an inadequate volume of blood received in culture bottles   Culture   Final    NO GROWTH 5 DAYS Performed at [facility], [address] [place], [place] [address]    Report Status 12/11/2023 FINAL  Final  Culture, blood (Routine x 2)     Status: None   Collection Time: 12/06/23  8:33 PM   Specimen: BLOOD RIGHT FOREARM  Result Value Ref Range Status   Specimen Description BLOOD RIGHT FOREARM  Final   Special Requests   Final    BOTTLES DRAWN AEROBIC AND ANAEROBIC Blood Culture results may not be optimal due to an inadequate volume of blood received in culture bottles   Culture   Final    NO GROWTH 5 DAYS Performed at [facility], [address] [place], [place] [address]    Report Status 12/11/2023 FINAL  Final    Radiology Report No results found.    Signature  -   Susa Raring M.D on 12/16/2023 at 8:16 AM   -  To page go to [email or web address]

## 2023-12-16 NOTE — TOC Progression Note (Signed)
 Transition of Care [facility]) - Progression Note    Patient Details  Name: [name] [name] MRN: [id number] Date of Birth: [birth year]  Transition of Care [facility]) CM/SW Contact  Alesia Richards, RN 12/16/2023, 11:59 AM  Clinical Narrative:     M  Noted, CM consult for substance use counseling. CM to patient's room regarding substance use recovery resources. Patient declined uses of substance to include alcohol and stated does not consider cigarettes substances. Patient declined to receive substance use resources.   Barriers to Discharge: Continued Medical Work up, Inadequate or no insurance, SNF Pending bed offer, Active Substance Use - Placement  Expected Discharge Plan and Services In-house Referral: Clinical Social Work   Post Acute Care Choice: Skilled Nursing Facility Living arrangements for the past 2 months: Homeless, Hotel/Motel                   Social Determinants of Health (SDOH) Interventions SDOH Screenings   Food Insecurity: Medium Risk (11/24/2023)   Received from [facility]  Housing: High Risk (11/24/2023)   Received from [facility]  Transportation Needs: Unmet Transportation Needs (11/24/2023)   Received from [facility]  Utilities: Unknown (11/24/2023)   Received from [facility]  Recent Concern: Utilities - High Risk (11/04/2023)   Received from [facility]  Financial Resource Strain: Low Risk  (06/22/2022)   Received from [facility]  Social Connections: Unknown (01/26/2022)   Received from [facility]  Stress: No Stress Concern Present (07/01/2023)   Received from [facility]  Tobacco Use: High Risk (12/06/2023)    Readmission Risk Interventions     No data to display

## 2023-12-16 NOTE — Progress Notes (Addendum)
 Nutrition Follow-up  DOCUMENTATION CODES:   Severe malnutrition in context of chronic illness  INTERVENTION:   Add double proteins to supplement intake Continue liberalized diet to encourage adequate calorie/protein intake Continue Ensure Enlive po BID, each supplement provides 350 kcal and 20 grams of protein. Continue Magic cup TID with meals, each supplement provides 290 kcal and 9 grams of protein Continue MVI w/ minerals Continue Florastor r/t ABX administration to maintain gut microbiome  NUTRITION DIAGNOSIS:  Severe Malnutrition related to chronic illness (COPD, Hep C, hx IV drug use) as evidenced by severe fat depletion, severe muscle depletion.  GOAL:  Patient will meet greater than or equal to 90% of their needs  MONITOR:  PO intake, Supplement acceptance, Weight trends  REASON FOR ASSESSMENT:  Consult Assessment of nutrition requirement/status  ASSESSMENT:   Pt with PMH: MRSA bacteremia, chronic HFrEF(20-25%), PE/DVT, chronic Hep C, hx IV drug use, COPD, and hx nonischemic myopathy endorsed as 2/2 to methamphetamine use. Presents to ED with c/o lower back pain. Of note, discharged from [facility] 3/22 and prefers not to return.  3/8-3/21 admitetd to [facility] in [place] for acute hypoxic respiratory failure 3/22 admitted 3/23 CXR trace bilateral pleural effusions 3/24 PICC line placed  He remains in house for IV ABX due to hx of drug abuse and homelessness as well as risk of non adherence to recommended medical regimen. Continues with unrealistic expectations around prognosis. Refused aspiration of his R hip abscess. He has also refused multiple tests and procedures this admission. PMT consulted.  Average Meal Intake 3/29: 50-75% x3 documented meals 3/30:0-20% x2, 100% x2 documented meals   Intake remains varied, however adequate. Accepting Ensure [employer]. Magic Cup remains on his tray.  Bowels stable. Florastor continues. No new difficulties chewing  or swallowing. Given stable appetite, will recommend double portion proteins to supplement intake when able.   Admit Weight: 55.8kg Current Weight: 54.8kg  Wt stable during this admission with slight trend upward. Still frail appearing. No edema noted.   Net IO Since Admission: -2,107.33 mL [12/16/23 0931]   Meds: famotidine, furosemide, MVI, Florastor, spironolactone, IV ABX Drips:  2g Mg sulfate x1  Labs: Na+ 132>129>129 (L) CRP 2.7>2.2>2.3 (H) CBGs 90-97 x24 hours   Diet Order:   Diet Order             Diet regular Fluid consistency: Thin; Fluid restriction: 1200 mL Fluid  Diet effective now            EDUCATION NEEDS:   Education needs have been addressed  Skin:  Skin Assessment: Skin Integrity Issues: Skin Integrity Issues:: DTI DTI: open/dehisced wound: mid sacrum  Last BM:  3/28  Height:  Ht Readings from Last 1 Encounters:  12/07/23 6\' 1"  (1.854 m)   Weight:  Wt Readings from Last 1 Encounters:  12/15/23 54.8 kg   Ideal Body Weight:  83.6 kg  BMI:  Body mass index is 15.94 kg/m.  Estimated Nutritional Needs:   Kcal:  2000-2200kcal  Protein:  100-115g  Fluid:  >1.9L/day  Myrtie Cruise MS, RD, LDN Registered Dietitian Clinical Nutrition RD Inpatient Contact Info in Amion

## 2023-12-16 NOTE — TOC Progression Note (Signed)
 Transition of Care [facility]) - Progression Note    Patient Details  Name: [name] [name] MRN: [id number] Date of Birth: 1961-09-11  Transition of Care [facility]) CM/SW Contact  Mearl Latin, LCSW Phone Number: 12/16/2023, 8:42 AM  Clinical Narrative:    TOC continuing to follow. Patient will remain in house to receive IV abx as there are no SNF bed offers.      Barriers to Discharge: Continued Medical Work up, Inadequate or no insurance, SNF Pending bed offer, Active Substance Use - Placement  Expected Discharge Plan and Services In-house Referral: Clinical Social Work   Post Acute Care Choice: Skilled Nursing Facility Living arrangements for the past 2 months: Homeless, Hotel/Motel                                       Social Determinants of Health (SDOH) Interventions SDOH Screenings   Food Insecurity: Medium Risk (11/24/2023)   Received from [facility]  Housing: High Risk (11/24/2023)   Received from [facility]  Transportation Needs: Unmet Transportation Needs (11/24/2023)   Received from [facility]  Utilities: Unknown (11/24/2023)   Received from [facility]  Recent Concern: Utilities - High Risk (11/04/2023)   Received from [facility]  Financial Resource Strain: Low Risk  (06/22/2022)   Received from [facility]  Social Connections: Unknown (01/26/2022)   Received from [facility]  Stress: No Stress Concern Present (07/01/2023)   Received from [facility]  Tobacco Use: High Risk (12/06/2023)    Readmission Risk Interventions     No data to display

## 2023-12-16 NOTE — Consult Note (Signed)
 Palliative Care Consult Note                                  Date: 12/16/2023   Patient Name: [name] [name]  DOB: 28-Dec-1960  MRN: [id number]  Age / Sex: 63 y.o., male  PCP: Patient, No Pcp Per Referring Physician: Leroy Sea, MD  Reason for Consultation: Establishing goals of care  HPI/Patient Profile: 63 y.o. 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  He was admitted on 12/06/2023 with MRSA bacteremia, C2-C3 osteomyelitis, right hip prosthetic joint infection, previous suspicion of tricuspid valve endocarditis, and others.   Palliative medicine was consulted for GOC conversations.  Past Medical History:  Diagnosis Date   Alcoholic (HCC)    Anxiety    COPD (chronic obstructive pulmonary disease) (HCC)    Depression    Hepatitis C    History of blood clots    Homelessness    Malingering    Peripheral neuropathy    Polysubstance abuse (HCC)     Subjective:   This NP Wynne Dust reviewed medical records, received report from team, assessed the patient and then meet at the patient's bedside to discuss diagnosis, prognosis, GOC, EOL wishes disposition and options.  I met with the patient at bedside, no family was present.   We meet to discuss diagnosis prognosis, GOC, EOL wishes, disposition and options. Concept of Palliative Care was introduced as specialized medical care for people and their families living with serious illness.  If focuses on providing relief from the symptoms and stress of a serious illness.  The goal is to improve quality of life for both the patient and the family. Values and  goals of care important to patient and family were attempted to be elicited.  Created space and opportunity for patient  and family to explore thoughts and feelings regarding current medical situation   Natural trajectory and current clinical status were discussed. Questions and concerns addressed. Patient  encouraged to call with questions or concerns.    Patient/Family Understanding of Illness: He states he does not really understand much about was going on.  We spent an extensive amount of time talking about his bacteremia, source of infection in his hip, plans for medical treatment of his cervical osteo, medical team recommendation for IR fluid/abscess aspiration to determine possible surgical options, etc.  I feel after our discussion he has a pretty good understanding of his health and the present sense.  Goals: To get better and get out of the hospital  Today's Discussion: In addition to discussions described above we had extensive discussion of various topics.  After our extensive discussion on his current health issues we discussed the procedures.  He stated that he thought that time an IR they would do bone revision and surgery without any sedation.  I explained that the point of IR was for fluid aspiration which was a relatively simple procedure not generally requiring anesthesia.  He stated he would like some sedation if possible to help with his fear of the procedure.  We discussed that based on the results of that fluid aspiration then orthopedic surgery  could talk to him about possible options for helping to control the source of his infection in his hip joint.  We also discussed possible placement of spacers to help with the infection.  I shared that ID is not sure even with extensive antibiotics whether they would be able to control his infection without surgery and source control.  At the end of this conversation he is agreeable to IR procedure but requested not be today.  I shared  that it would be nearly impossible for that to be done today.  I shared that I would speak with the medical team and notify them of his decision.  He had some questions about specifics about possible surgical procedures but I defer that to orthopedic surgery after they are able to meet with him again.  Next we talked extensively about compliance with therapy.  I shared that therapy is part of the attempt to get him better so he can get out of the hospital at some point.  I shared that this would be especially true if he does end up having hip surgery.  I shared that frankly if he stayed in the bed that he would likely die in the bed.  He verbalized that he thinks that this is true.  I told him that when I came back in a day or 2 that we would discuss this again and if he remains agreeable to therapy then we can request them to try to come back for an evaluation.  I encouraged him to comply with therapy to try to get better and mobile, even if it is somewhat uncomfortable.  I shared that I would check back with him tomorrow or the day after, which she is agreeable to. I provided emotional and general support through therapeutic listening, empathy, sharing of stories, therapeutic touch, and other techniques. I answered all questions and addressed all concerns to the best of my ability.  Review of Systems  Respiratory:  Negative for shortness of breath.   Cardiovascular:  Negative for chest pain.  Gastrointestinal:  Positive for abdominal pain (Upper, rib area). Negative for nausea and vomiting.    Objective:   Primary Diagnoses: Present on Admission:  MRSA bacteremia   Physical Exam Vitals and nursing note reviewed.  Constitutional:      General: He is not in acute distress.    Appearance: He is ill-appearing.  HENT:     Head: Normocephalic and atraumatic.  Cardiovascular:     Rate and Rhythm: Normal rate.  Pulmonary:     Effort: Pulmonary effort is normal. No respiratory distress.   Abdominal:     General: Abdomen is flat. There is no distension.     Palpations: Abdomen is soft.  Skin:    General: Skin is warm and dry.  Neurological:     General: No focal deficit present.     Mental Status: He is alert.  Psychiatric:        Mood and Affect: Mood normal.        Behavior: Behavior normal.     Vital Signs:  BP 110/82 (BP Location: Left Arm)   Pulse 80   Temp 98 F (36.7 C) (Oral)   Resp 20   Ht 6\' 1"  (1.854 m)   Wt 54.8 kg   SpO2 98%   BMI 15.94 kg/m   Palliative Assessment/Data: 30-40%    Advanced Care Planning:   Existing Vynca/ACP Documentation: None  Primary Decision Maker: PATIENT  Code Status/Advance Care Planning: Full  code  A discussion was had today regarding advanced directives. Concepts specific to code status, artifical feeding and hydration, continued IV antibiotics and rehospitalization was had.  The difference between a aggressive medical intervention path and a palliative comfort care path for this patient at this time was had.   Decisions/Changes to ACP: None today  Assessment & Plan:   Impression: 63 year old male with acute presentation chronic comorbidities as described above.  The patient has a significant infection likely from hip joint infection.  He refused attempted IR aspiration.  After our discussion today he is agreeable to reattempt IR aspiration, open to discussing surgical options with orthopedic surgery after aspiration.  Also seems like he will be agreeable to next day or 2 to reengage with PT/OT.  Palliative medicine will continue to follow.  Overall prognosis guarded to poor pending participation and the medical plan.  SUMMARY OF RECOMMENDATIONS   Full code Full scope of care Agreeable to IR aspiration Open to discussions with orthopedic surgery pending IR aspiration Will likely be agreeable to PT/OT in the next day or 2 Palliative medicine will follow  Symptom Management:  Per primary team PMT is  available to assist as needed  Prognosis:  Unable to determine  Discharge Planning:  To Be Determined   Discussed with: Patient, medical team, nursing team    Thank you for allowing [place] to participate in the care of Moises Terpstra PMT will continue to support holistically.  Time Total: 107 min  Detailed review of medical records (labs, imaging, vital signs), medically appropriate exam, discussed with treatment team, counseling and education to patient, family, & staff, documenting clinical information, medication management, coordination of care  Signed by: Wynne Dust, NP Palliative Medicine Team  Team Phone # [phone] (Nights/Weekends)  12/16/2023, 4:13 PM

## 2023-12-17 DIAGNOSIS — R7881 Bacteremia: Secondary | ICD-10-CM | POA: Diagnosis not present

## 2023-12-17 DIAGNOSIS — M549 Dorsalgia, unspecified: Secondary | ICD-10-CM

## 2023-12-17 DIAGNOSIS — Z7189 Other specified counseling: Secondary | ICD-10-CM | POA: Diagnosis not present

## 2023-12-17 DIAGNOSIS — B9562 Methicillin resistant Staphylococcus aureus infection as the cause of diseases classified elsewhere: Secondary | ICD-10-CM | POA: Diagnosis not present

## 2023-12-17 DIAGNOSIS — Z515 Encounter for palliative care: Secondary | ICD-10-CM | POA: Diagnosis not present

## 2023-12-17 LAB — COMPREHENSIVE METABOLIC PANEL WITH GFR
ALT: 18 U/L (ref 0–44)
AST: 22 U/L (ref 15–41)
Albumin: 2 g/dL — ABNORMAL LOW (ref 3.5–5.0)
Alkaline Phosphatase: 68 U/L (ref 38–126)
Anion gap: 6 (ref 5–15)
BUN: 19 mg/dL (ref 8–23)
CO2: 27 mmol/L (ref 22–32)
Calcium: 8.6 mg/dL — ABNORMAL LOW (ref 8.9–10.3)
Chloride: 95 mmol/L — ABNORMAL LOW (ref 98–111)
Creatinine, Ser: 0.88 mg/dL (ref 0.61–1.24)
GFR, Estimated: >60 mL/min (ref >60)
Glucose, Bld: 97 mg/dL (ref 70–99)
Potassium: 3.7 mmol/L (ref 3.5–5.1)
Sodium: 128 mmol/L — ABNORMAL LOW (ref 135–145)
Total Bilirubin: 0.6 mg/dL (ref 0.0–1.2)
Total Protein: 7.3 g/dL (ref 6.5–8.1)

## 2023-12-17 LAB — CBC WITH DIFFERENTIAL/PLATELET
Abs Immature Granulocytes: 0.04 10*3/uL (ref 0.00–0.07)
Basophils Absolute: 0 10*3/uL (ref 0.0–0.1)
Basophils Relative: 1 %
Eosinophils Absolute: 0.5 10*3/uL (ref 0.0–0.5)
Eosinophils Relative: 6 %
HCT: 29.4 % — ABNORMAL LOW (ref 39.0–52.0)
Hemoglobin: 9.7 g/dL — ABNORMAL LOW (ref 13.0–17.0)
Immature Granulocytes: 1 %
Lymphocytes Relative: 13 %
Lymphs Abs: 1 10*3/uL (ref 0.7–4.0)
MCH: 28.8 pg (ref 26.0–34.0)
MCHC: 33 g/dL (ref 30.0–36.0)
MCV: 87.2 fL (ref 80.0–100.0)
Monocytes Absolute: 0.7 10*3/uL (ref 0.1–1.0)
Monocytes Relative: 9 %
Neutro Abs: 5.6 10*3/uL (ref 1.7–7.7)
Neutrophils Relative %: 70 %
Platelets: 289 10*3/uL (ref 150–400)
RBC: 3.37 MIL/uL — ABNORMAL LOW (ref 4.22–5.81)
RDW: 16.1 % — ABNORMAL HIGH (ref 11.5–15.5)
WBC: 7.8 10*3/uL (ref 4.0–10.5)
nRBC: 0 % (ref 0.0–0.2)

## 2023-12-17 LAB — VANCOMYCIN, PEAK: Vancomycin Pk: 39 ug/mL (ref 30–40)

## 2023-12-17 LAB — PROCALCITONIN: Procalcitonin: <0.1 ng/mL

## 2023-12-17 NOTE — Plan of Care (Signed)

## 2023-12-17 NOTE — Progress Notes (Signed)
 PROGRESS NOTE                                                                                                                                                                                                             Patient Demographics:    [name] [name], is a 63 y.o. male, DOB - 14-May-1961, MVH:[id number]  Outpatient Primary MD for the patient is Patient, No Pcp Per    LOS - 10  Admit date - 12/06/2023    Chief Complaint  Patient presents with   Abdominal Pain       Brief Narrative (HPI from H&P)    63 y.o. 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   He did not want to go back to [facility], he was also due for a TEE and was admitted to the hospital here for further care.   Subjective:   Patient in bed, appears comfortable, denies any headache, no fever, no chest pain or pressure, no shortness of breath , no abdominal pain. No focal weakness.   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  Being treated for MRSA bacteremia through IV vancomycin under the guidance of cardiology and ID.  Per neurosurgeon Dr. 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  IR tried to aspirate his right hip abscess and fluid collection but patient refused earlier in the admission.  He has refused multiple tests and procedures this admission despite counseling.  Eventually he has agreed to undergo  aspiration on 12/17/2023 after palliative care was consulted for goals of care, IR has been reconsulted, have informed orthopedics and ID.  Fluid cell count with differential, cytology, Gram stain and culture ordered.  For now continue IV Vancomycin with stop date 01/17/2024 via PICC line, he is homeless, IV drug user, not compliant with appointments and ID feels that prolonged IV antibiotics in the hospital is the safest course for now.  Of note patient was to get Dalbavancin prior to [facility] but appears that he never received it.    C2-C3 osteomyelitis, lower back pain, compression fractures in lumbar spine vertebrae, T12, L2-L3 L5 - repeat MRI L-spine this admission stable, supportive care.  No signs of acute infection, C-spine MRI changes discussed with Dr. Conchita Paris, medical treatment.  Supportive care for brain, history of narcotic seeking behavior.   Lactic acidosis - due to dehydration.  Has been hydrated.   Chronic HFrEF - LVEF 20 to 25%, cardiology following.  Continue on Aldactone further diuretics per cardiology, euvolemic currently.   Possible acute pyelonephritis seen on CT scan, POA  - Continue IV antibiotics, As needed analgesics, negative urine culture.   History of PE/DVT on Eliquis  Resume home Eliquis also has IVC filter.   History of polysubstance abuse  Denies recent use of IV drugs   Tobacco use disorder  - Current smoker, Nicotine patch   Generalized weakness  PT OT assessment, Fall precautions    Severe protein calorie malnutrition - Dietitian consult, protein supplementation  Hyponatremia.  SIADH, received a dose of Samsca with good effect, counseled on fluid restriction, he is not compliant with fluid restriction and sodium gradually trending down, counseled again.  Continue fluid restriction and monitor.  Incidental finding of right inguinal hernia.  No acute issues.  Monitor   No IV access.  PICC line placed 12/09/2023 after much counseling       Condition - Extremely Guarded  Family Communication  :  None present  Code Status :   Full  Consults  :  ID, Cards, Dr. Conchita Paris Neurosurgery, orthopedics, IR, palliative care  PUD Prophylaxis :  Pepcid   Procedures  :     PICC line placed 12/09/2023   CT R Hip - 1. Status post total right hip arthroplasty. 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 2. There is complex joint fluid inferior to the right femoral neck prosthesis with mild-to-moderate joint capsule thickening. This fluid extends into the right obturator externus muscle and into the right iliopsoas bursa. There also appears to be posterior extension of joint fluid into a fluid collection wrapping around the posterior aspect of the right greater trochanter and extending superiorly into the right gluteus medius muscle. This is suspicious for right humerus however septic arthritis with the infected joint fluid extending peripherally. 3. Moderate right gluteus minimus and medius muscle edema. Focal edema within the anterior aspect of the left gluteus medius muscle. 4. There is metallic susceptibility artifact in the region of the posterior right hip/buttock. Recommend clinical correlation for possible surgical skin staples in this region.   TTE - 1. Left ventricular  ejection fraction, by estimation, is 20 to 25%. The left ventricle has severely decreased function. The left ventricle demonstrates global hypokinesis. The left ventricular internal cavity size was mildly dilated. Left ventricular diastolic parameters  are consistent with Grade I diastolic dysfunction (impaired relaxation). No LV thrombus noted.  2. Right ventricular systolic function is mildly reduced. The right ventricular size is normal. Tricuspid regurgitation signal is inadequate for assessing PA pressure.  3. The mitral valve is normal in structure. No evidence of mitral valve regurgitation. No evidence of mitral stenosis. Moderate mitral annular calcification.  4. The aortic valve was not well visualized. Aortic valve regurgitation is not visualized. No aortic stenosis is present.  5. IVC not visualized.  MRI brain.  Nonacute.    MRI C-spine.   Severely motion limited noncontrast study. Significant marrow edema surrounding the left C2-C3 facet joint and extending to involve the posterior left C2 and C3 vertebral bodies with surrounding paraspinal edema, concerning for septic arthritis and possibly discitis/osteomyelitis given the reported clinical concern for infection. A repeat MRI of the cervical spine with contrast is recommended when the patient is able (possibly with sedation) to better evaluate.  MRI L-spine.  1. Chronic compression fractures of T12, L2, L3, and L5 with no complicating features. 2. Subtle and nonspecific bilateral erector spinae muscle edema. A similar appearance can be seen in chronically debilitated patients. 3. 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 4. Capacious lumbar spinal canal with no spinal stenosis  CT scan abdomen pelvis.1. Emphysematous changes and chronic bronchitic changes in the lung bases. Trace bilateral pleural effusions. 2. Heterogeneous nephrograms  bilaterally suggesting acute pyelonephritis. No abscess or hydronephrosis. 3. Moderate-sized right inguinal hernia containing bowel without obvious proximal obstruction. Visualization is limited due to streak artifact. 4. Aortic atherosclerosis.  Inferior vena caval filter. 5. Small amount of free fluid in the abdomen of nonspecific etiology      Disposition Plan  :    Status is: Inpatient   DVT Prophylaxis  :     apixaban (ELIQUIS) tablet 5 mg     Lab Results  Component Value Date   PLT 289 12/17/2023    Diet :  Diet Order             Diet regular Fluid consistency: Thin; Fluid restriction: 1200 mL Fluid  Diet effective now                    Inpatient Medications  Scheduled Meds:  apixaban  5 mg Oral BID   atorvastatin  80 mg Oral QHS   carvedilol  3.125 mg Oral BID WC   Chlorhexidine Gluconate Cloth  6 each Topical Daily   famotidine  20 mg Oral Daily   feeding supplement  237 mL Oral BID BM   multivitamin with minerals  1 tablet Oral Daily   saccharomyces boulardii  250 mg Oral BID   spironolactone  25 mg Oral Daily   Continuous Infusions:  vancomycin 1,250 mg (12/16/23 1116)   PRN Meds:.acetaminophen, HYDROmorphone (DILAUDID) injection, melatonin, naLOXone (NARCAN)  injection, oxyCODONE, polyethylene glycol, prochlorperazine, sodium chloride flush     Objective:   Vitals:   12/17/23 0017 12/17/23 0459 12/17/23 0500 12/17/23 0816  BP: 120/71 114/76  111/67  Pulse: 77   76  Resp:  18  20  Temp: 98 F (36.7 C) 98.3 F (36.8 C)  98.3 F (36.8 C)  TempSrc: Oral Oral  Oral  SpO2: 98% 99%  98%  Weight:   56.4 kg   Height:        Wt Readings from Last 3 Encounters:  12/17/23 56.4 kg  10/23/18 72.6 kg  09/09/18 70.3 kg     Intake/Output Summary (Last 24 hours) at 12/17/2023 0942 Last data filed at 12/17/2023 0213 Gross per 24 hour  Intake --  Output 200 ml  Net -200 ml      Physical Exam  Awake Alert, No new F.N deficits, Normal  affect [place].AT,PERRAL Supple Neck, No JVD,   Symmetrical Chest wall movement, Good air movement bilaterally, CTAB RRR  +ve B.Sounds, Abd Soft, No tenderness,   No Cyanosis, Clubbing or edema      Data Review:    Recent Labs  Lab 12/13/23 0403 12/14/23 0305 12/15/23 0300 12/16/23 0228 12/17/23 0316  WBC 6.9 6.8 7.7 7.8 7.8  HGB 9.1* 9.6* 9.6* 9.6* 9.7*  HCT 28.2* 29.5* 30.0* 29.7* 29.4*  PLT 318 327 321 323 289  MCV 88.1 87.8 88.0 88.7 87.2  MCH 28.4 28.6 28.2 28.7 28.8  MCHC 32.3 32.5 32.0 32.3 33.0  RDW 15.9* 15.9* 15.9* 16.1* 16.1*  LYMPHSABS 0.9 1.0 1.0 1.2 1.0  MONOABS 0.7 0.7 0.7 0.8 0.7  EOSABS 0.2 0.3 0.3 0.5 0.5  BASOSABS 0.0 0.0 0.1 0.0 0.0    Recent Labs  Lab 12/11/23 0314 12/12/23 0330 12/13/23 0403 12/14/23 0305 12/15/23 0300 12/16/23 0228 12/17/23 0316  NA 128* 125* 127* 132* 129* 129* 128*  K 3.8 3.6 4.1 4.0 4.3 4.1 3.7  CL 92* 89* 95* 98 94* 94* 95*  CO2 26 26 25 24 25 24 27   ANIONGAP 10 10 7 10 10 11 6   GLUCOSE 98 99 98 103* 97 90 97  BUN 30* 27* 20 17 20 21 19   CREATININE 1.27* 0.98 0.81 0.87 0.86 0.86 0.88  AST 19 20 21 21 22 23 22   ALT 19 16 17 17 17 18 18   ALKPHOS 66 63 59 67 67 67 68  BILITOT 0.8 0.9 0.6 0.5 0.7 0.6 0.6  ALBUMIN 2.0* 1.9* 1.9* 2.0* 2.0* 2.0* 2.0*  CRP 4.4* 2.9* 2.7* 2.7* 2.2* 2.3*  --   PROCALCITON  --   --  <0.10 <0.10 <0.10 <0.10 <0.10  BNP 236.1* 178.0*  --   --   --   --   --   MG 2.4 1.7 1.7 1.8 1.7 1.7  --   CALCIUM 8.8* 8.4* 8.4* 8.8* 8.8* 8.8* 8.6*      Recent Labs  Lab 12/11/23 0314 12/12/23 0330 12/13/23 0403 12/14/23 0305 12/15/23 0300 12/16/23 0228 12/17/23 0316  CRP 4.4* 2.9* 2.7* 2.7* 2.2* 2.3*  --   PROCALCITON  --   --  <0.10 <0.10 <0.10 <0.10 <0.10  BNP 236.1* 178.0*  --   --   --   --   --   MG 2.4 1.7 1.7 1.8 1.7 1.7  --   CALCIUM 8.8* 8.4* 8.4* 8.8* 8.8* 8.8* 8.6*    --------------------------------------------------------------------------------------------------------------- Lab  Results  Component Value Date   CHOL 114 12/09/2023   HDL 43 12/09/2023   LDLCALC 59 12/09/2023   TRIG 59 12/09/2023   CHOLHDL 2.7 12/09/2023    No results found for: "HGBA1C" No results for input(s): "TSH", "T4TOTAL", "FREET4", "T3FREE", "THYROIDAB" in the last 72 hours. No results for input(s): "VITAMINB12", "FOLATE", "FERRITIN", "TIBC", "IRON", "RETICCTPCT" in the last 72 hours. ------------------------------------------------------------------------------------------------------------------ Cardiac Enzymes No results for input(s): "CKMB", "TROPONINI", "MYOGLOBIN" in the last 168 hours.  Invalid input(s): "CK"  Micro Results No results found for this or any previous visit (from the past 240 hours).   Radiology Report  No results found.    Signature  -   Susa Raring M.D on 12/17/2023 at 9:42 AM   -  To page go to [email or web address]

## 2023-12-17 NOTE — Consult Note (Signed)
 Chief Complaint: Right hip abscess/septic arthritis - IR consult for joint aspiration and drainage  Referring Provider(s): Susa Raring, MD  Supervising Physician: Ruel Favors  Patient Status: [facility] - In-pt  History of Present Illness: [name] [name] is a 63 y.o. 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 Initially presented to [facility] ED 12/06/23 with abdominal pain and lower back pain.Initial CT on 12/06/23 showed  Right hip arthroplasty with protrusio acetabula and thinning of the wall of the acetabulum. 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 IR was initially contacted 12/11/23, but pt had declined any intervention at that time. Today, 12/17/23, pt now reportedly agreeable to proceed.    Patient is Full Code  Past Medical History:  Diagnosis Date   Alcoholic (HCC)    Anxiety    COPD (chronic obstructive pulmonary disease) (HCC)    Depression    Hepatitis C    History of blood clots    Homelessness    Malingering    Peripheral neuropathy    Polysubstance abuse (HCC)     Past Surgical History:  Procedure Laterality Date   ABDOMINAL SURGERY     HIP SURGERY      Allergies: Fentanyl, Hydromorphone hcl, Morphine, Pantoprazole, Pantoprazole sodium, Haloperidol, Nsaids, Morphine and codeine, Ibuprofen, Ketorolac, and Pantoprazole sodium  Medications: Prior to Admission medications   Medication Sig Start Date End Date Taking? Authorizing Provider  atorvastatin (LIPITOR) 80 MG tablet Take 80 mg by mouth at bedtime. 07/18/23  Yes [provider]  bisacodyl (DULCOLAX) 10 MG suppository Place  rectally. 11/23/23  Yes [provider]  Docusate Sodium (DSS) 100 MG CAPS Take 1 capsule by mouth daily as needed. 11/23/23  Yes [provider]  ELIQUIS 5 MG TABS tablet Take 5 mg by mouth 2 (two) times daily. 08/21/23  Yes [provider]  escitalopram (LEXAPRO) 20 MG tablet Take 20 mg by mouth daily. 08/21/23  Yes [provider]  famotidine (PEPCID) 20 MG tablet Take 10 mg by mouth 2 (two) times daily. 08/21/23  Yes [provider]  hydrOXYzine (ATARAX) 25 MG tablet Take 25 mg by mouth 3 (three) times daily. 11/23/23  Yes [provider]  metoprolol succinate (TOPROL-XL) 25 MG 24 hr tablet Take 25 mg by mouth daily. 11/23/23  Yes [provider]  Multiple Vitamin (QUINTABS) TABS Take 1 tablet by mouth daily. 09/17/18  Yes [provider]  naloxone (NARCAN) nasal spray 4 mg/0.1 mL Place 0.4 mg into the nose once. 09/03/23  Yes [provider]  polyethylene glycol powder (GLYCOLAX/MIRALAX) 17 GM/SCOOP powder Take 17 g by mouth 2 (two) times daily as needed for mild constipation. 11/23/23  Yes [provider]  prochlorperazine (COMPAZINE) 5 MG tablet Take 5 mg by mouth 2 (two) times daily as needed for nausea or vomiting. 11/23/23  Yes [provider]  senna (SENOKOT) 8.6 MG TABS tablet Take 2 tablets by mouth daily as needed for mild constipation.   Yes [provider]  spironolactone (ALDACTONE) 25 MG tablet Take 25 mg by mouth daily. 09/18/23  Yes [provider]  torsemide (DEMADEX) 20 MG tablet Take 1 tablet by mouth daily. 09/09/23  Yes [provider]  traMADol (ULTRAM) 50 MG tablet Take 50 mg by mouth every 6 (six) hours as needed. 11/23/23  Yes [provider]     Family History  Problem Relation Age of Onset   Heart failure Father    Coronary artery disease Brother     Social History   Socioeconomic History   Marital status: Single    Spouse name: Not on file    Number of children: Not on file   Years of education: Not on file   Highest education level: Not on file  Occupational History   Not on file  Tobacco Use   Smoking status: Every Day    Current packs/day: 0.50    Average packs/day: 0.5 packs/day for 15.0 years (7.5 ttl pk-yrs)    Types: Cigarettes   Smokeless tobacco: Never  Vaping Use   Vaping status: Never Used  Substance and Sexual Activity   Alcohol use: Yes    Comment: 1/5 daily   Drug use: Not Currently    Types: Cocaine, Marijuana    Comment: heroin   Sexual activity: Never  Other Topics Concern   Not on file  Social History Narrative   Not on file   Social Drivers of Health   Financial Resource Strain: Low Risk  (06/22/2022)   Received from [facility]   Overall Financial Resource Strain (CARDIA)    Difficulty of Paying Living Expenses: Not hard at all  Food Insecurity: Medium Risk (11/24/2023)   Received from [facility]   Hunger Vital Sign    Worried About Running Out of Food in the Last Year: Sometimes true    Ran Out of Food in the Last Year: Sometimes true  Transportation Needs: Unmet Transportation Needs (11/24/2023)   Received from [employer]    In the past 12 months, has lack of reliable transportation kept you from medical appointments, meetings, work or from getting things needed for daily living? : Yes  Physical Activity: Not on file  Stress: No Stress Concern Present (07/01/2023)   Received from [facility] - Occupational Stress Questionnaire    Feeling of Stress : Not at all  Social Connections: Unknown (01/26/2022)   Received from [facility]   Social Network    Social Network: Not on file     Review of Systems: A 12 point ROS discussed and pertinent positives are indicated in the HPI above.  All other systems are negative.  Review of Systems  Musculoskeletal:  Positive for arthralgias (bil LE), back pain (low) and myalgias  (bil LE).    Vital Signs: BP 111/67 (BP Location: Left Arm)   Pulse 76   Temp 98.3 F (36.8 C) (Oral)   Resp 20   Ht 6\' 1"  (1.854 m)   Wt 124 lb 5.4 oz (56.4 kg)   SpO2 98%   BMI 16.40 kg/m   Advance Care Plan: No documents on file.  Physical Exam Vitals and nursing note reviewed.  Constitutional:      General: He is not in acute distress. HENT:     Mouth/Throat:     Mouth: Mucous membranes are moist.     Pharynx: Oropharynx is clear.  Cardiovascular:     Rate and Rhythm: Normal rate and regular rhythm.  Pulmonary:     Effort: Pulmonary effort is normal.     Breath sounds: Normal breath sounds.  Abdominal:     Palpations: Abdomen is  soft.     Tenderness: There is no abdominal tenderness.  Skin:    General: Skin is warm and dry.  Neurological:     Mental Status: He is alert and oriented to person, place, and time.     Imaging: DG Pelvis 1-2 Views Result Date: 12/11/2023 CLINICAL DATA:  History of total hip arthroplasty. EXAM: PELVIS - 1-2 VIEW COMPARISON:  CT 12/06/2023 FINDINGS: Right hip arthroplasty. 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 The heterotopic calcification about the right hemipelvis is not as well demonstrated on the current exam. The acetabular thinning is better demonstrated on recent CT. Distal most femoral stem is not entirely included in the field of view. The bones are subjectively under mineralized IMPRESSION: Right hip arthroplasty with acetabular protrusion, similar to recent abdominal CT. Electronically Signed   By: Narda Rutherford M.D.   On: 12/11/2023 16:37   MR HIP RIGHT WO CONTRAST Result Date: 12/10/2023 CLINICAL DATA:  Septic arthritis. 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 Acute on chronic right hip pain. MRSA bacteremia. Acute on chronic right hip pain. EXAM: MR OF THE RIGHT HIP WITHOUT CONTRAST TECHNIQUE: Multiplanar, multisequence MR imaging was performed. No intravenous contrast was administered. The technologist  reports the study was limited due to patient's evaluate tolerate the exam protocol. Best images possible were obtained within the limitations of patient motion and metallic artifact. The patient refused to continue for postcontrast portion of the study. COMPARISON:  CT abdomen and pelvis 12/06/2023 FINDINGS: Despite efforts by the technologist and patient, mild to moderate motion artifact is present on today's exam and could not be eliminated. This reduces exam sensitivity and specificity. Bones/joints/cartilage: There is metallic susceptibility artifact from total right hip arthroplasty. 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 There is decreased T1 and increased T2 signal fluid inferior to the right femoral neck prosthesis, suggesting a complex joint effusion with mild-to-moderate joint capsule thickening. This fluid extends into the right obturator externus muscle, measuring up to 4.6 x 2.3 x 2.6 cm (transverse by AP by craniocaudal; axial series 6, image 41 and coronal series 10, image 9). There is also a thin connection of this fluid extending to mild fluid within the right iliopsoas bursa (axial series 6 images 13 through 33). Overall, this fluid appears to represent complex joint fluid extending into the right iliopsoas bursa. Within the limitations of metallic artifact, there also may be joint fluid extending inferior to the right femoral prosthetic head-neck junction (axial series 6, image 21 and coronal series 9, image 20). There also appears to be posterior extension of joint fluid (axial series 6, image 24) into a fluid collection wrapping around the posterior aspect of the right greater trochanter (axial series 6, image 23 and coronal series 10, image 28), measuring up to  approximately 5.0 x 1.9 x 3.2 cm (transverse by AP by craniocaudal). There are mild septations within this fluid, and there is additional adjacent septated fluid extending more superiorly, into the right gluteus medius muscle (axial series 6, image 9 and coronal series 10, image 23) measuring up tor 4.7 x 2.3 x 2.0 cm (transverse by AP by craniocaudal. This is suspicious for infected joint fluid and developing abscesses. Muscles and tendons Muscles and tendons: Complex joint fluid extending into the right obturator externus muscle as above.  Moderate right gluteus minimus and medius muscle edema. Focal edema within the anterior aspect of the left gluteus medius muscle (axial series 6, image 1). Other findings Miscellaneous: There is metallic susceptibility artifact in the region of the posterior right hip/buttock (axial series 6, image 11 and coronal series 9, image 6). Recommend clinical correlation for possible surgical skin staples in this region. IMPRESSION: 1. Status post total right hip arthroplasty. 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 2. There is complex joint fluid inferior to the right femoral neck prosthesis with mild-to-moderate joint capsule thickening. This fluid extends into the right obturator externus muscle and into the right iliopsoas bursa. There also appears to be posterior extension of joint fluid into a fluid collection wrapping around the posterior aspect of the right greater trochanter and extending superiorly into the right gluteus medius muscle. This is suspicious for right humerus however septic arthritis with the infected joint fluid extending peripherally. 3. Moderate right gluteus minimus and medius muscle edema. Focal edema within the anterior aspect of the left gluteus  medius muscle. 4. There is metallic susceptibility artifact in the region of the posterior right hip/buttock. Recommend clinical correlation for possible surgical skin staples in this region. Electronically Signed   By: Neita Garnet M.D.   On: 12/10/2023 14:35   MR CERVICAL SPINE WO CONTRAST Result Date: 12/10/2023 CLINICAL DATA:  Neck pain, infection suspected, no prior imaging EXAM: MRI CERVICAL SPINE WITHOUT CONTRAST TECHNIQUE: Multiplanar, multisequence MR imaging of the cervical spine was performed. No intravenous contrast was administered. COMPARISON:  None Available. FINDINGS: Severely motion limited study. Postcontrast imaging was performed due to patient intolerance. Within this limitation: Alignment: No substantial sagittal subluxation. Vertebrae: Limited evaluation due to motion. Significant edema surrounding the left C2-C3 facet joint and extending to involve the posterior left C2 and C3 vertebral bodies. Cord: Poorly evaluated due to motion. No obvious cord signal abnormality. Posterior Fossa, vertebral arteries, paraspinal tissues: Paraspinal edema surrounding the left C2-C3 facet joint. Disc levels: No significant canal stenosis. Nondiagnostic evaluation of the foramina due to severe motion on axial sequences. IMPRESSION: Severely motion limited noncontrast study. Significant marrow edema surrounding the left C2-C3 facet joint and extending to involve the posterior left C2 and C3 vertebral bodies with surrounding paraspinal edema, concerning for septic arthritis and possibly discitis/osteomyelitis given the reported clinical concern for infection. A repeat MRI of the cervical spine with contrast is recommended when the patient is able (possibly with sedation) to better evaluate. These results will be called to the ordering clinician or representative by the Radiologist Assistant, and communication documented in the PACS or [employer]. Electronically Signed   By: Feliberto Harts M.D.   On:  12/10/2023 02:10   MR BRAIN WO CONTRAST Result Date: 12/10/2023 CLINICAL DATA:  Brain abscess EXAM: MRI HEAD WITHOUT CONTRAST TECHNIQUE: Multiplanar, multiecho pulse sequences of the brain and surrounding structures were obtained without intravenous contrast. COMPARISON:  None Available. FINDINGS: Brain: No acute infarction, hemorrhage, hydrocephalus, extra-axial collection or mass lesion. Vascular: Major arterial flow voids are maintained at the skull base. Skull and upper cervical spine: Normal marrow signal. Sinuses/Orbits: Clear sinuses.  No acute orbital findings. Other: No mastoid effusions. IMPRESSION: No evidence of acute intracranial abnormality. Electronically Signed   By: Feliberto Harts M.D.   On: 12/10/2023 02:03   ECHOCARDIOGRAM COMPLETE  Result Date: 12/09/2023    ECHOCARDIOGRAM REPORT   Patient Name:   LARNCE SCHNACKENBERG Date of Exam: 12/09/2023 Medical Rec #:  [id number]    Height:       73.0 in Accession #:    [id number]   Weight:       124.1 lb Date of Birth:  09/07/1961    BSA:          1.757 m Patient Age:    63 years     BP:           123/81 mmHg Patient Gender: M            HR:           80 bpm. Exam Location:  Inpatient Procedure: 2D Echo, Cardiac Doppler, Color Doppler and Intracardiac            Opacification Agent (Both Spectral and Color Flow Doppler were            utilized during procedure). Indications:    Bacteremia  History:        Patient has no prior history of Echocardiogram examinations.                 CHF, COPD; Risk Factors:IVDA.  Sonographer:    Amy Chionchio Referring Phys: [id number] Ellsworth Lennox IMPRESSIONS  1. Left ventricular ejection fraction, by estimation, is 20 to 25%. The left ventricle has severely decreased function. The left ventricle demonstrates global hypokinesis. The left ventricular internal cavity size was mildly dilated. Left ventricular diastolic parameters are consistent with Grade I diastolic dysfunction (impaired relaxation). No LV thrombus noted.   2. Right ventricular systolic function is mildly reduced. The right ventricular size is normal. Tricuspid regurgitation signal is inadequate for assessing PA pressure.  3. The mitral valve is normal in structure. No evidence of mitral valve regurgitation. No evidence of mitral stenosis. Moderate mitral annular calcification.  4. The aortic valve was not well visualized. Aortic valve regurgitation is not visualized. No aortic stenosis is present.  5. IVC not visualized. FINDINGS  Left Ventricle: Left ventricular ejection fraction, by estimation, is 20 to 25%. The left ventricle has severely decreased function. The left ventricle demonstrates global hypokinesis. Definity contrast agent was given IV to delineate the left ventricular endocardial borders. The left ventricular internal cavity size was mildly dilated. There is no left ventricular hypertrophy. Left ventricular diastolic parameters are consistent with Grade I diastolic dysfunction (impaired relaxation). Right Ventricle: The right ventricular size is normal. No increase in right ventricular wall thickness. Right ventricular systolic function is mildly reduced. Tricuspid regurgitation signal is inadequate for assessing PA pressure. Left [facility]: Left atrial size was normal in size. Right [facility]: Right atrial size was normal in size. Pericardium: There is no evidence of pericardial effusion. Mitral Valve: The mitral valve is normal in structure. Moderate mitral annular calcification. No evidence of mitral valve regurgitation. No evidence of mitral valve stenosis. MV peak gradient, 2.4 mmHg. The mean mitral valve gradient is 1.0 mmHg. Tricuspid Valve: The tricuspid valve is normal in structure. Tricuspid valve regurgitation is not demonstrated. Aortic Valve: The aortic valve was not well visualized. Aortic valve regurgitation is not visualized. No aortic stenosis is present. Aortic valve mean gradient measures 3.0 mmHg. Aortic valve peak gradient measures 4.7  mmHg. Aortic valve area, by VTI measures 2.31 cm. Pulmonic Valve: The pulmonic valve was normal in structure. Pulmonic valve regurgitation is not visualized. Aorta: The aortic root is normal in size and structure. Venous:  IVC not visualized. The inferior vena cava was not well visualized. IAS/Shunts: No atrial level shunt detected by color flow Doppler.  LEFT VENTRICLE PLAX 2D LVOT diam:     2.20 cm   Diastology LV SV:         33        LV e' medial:    5.00 cm/s LV SV Index:   19        LV E/e' medial:  9.1 LVOT Area:     3.80 cm  LV e' lateral:   5.87 cm/s                          LV E/e' lateral: 7.7  RIGHT VENTRICLE TAPSE (M-mode): 1.7 cm AORTIC VALVE AV Area (Vmax):    2.37 cm AV Area (Vmean):   2.15 cm AV Area (VTI):     2.31 cm AV Vmax:           108.00 cm/s AV Vmean:          78.600 cm/s AV VTI:            0.141 m AV Peak Grad:      4.7 mmHg AV Mean Grad:      3.0 mmHg LVOT Vmax:         67.20 cm/s LVOT Vmean:        44.500 cm/s LVOT VTI:          0.086 m LVOT/AV VTI ratio: 0.61 MITRAL VALVE MV Area (PHT): 2.96 cm    SHUNTS MV Area VTI:   2.19 cm    Systemic VTI:  0.09 m MV Peak grad:  2.4 mmHg    Systemic Diam: 2.20 cm MV Mean grad:  1.0 mmHg MV Vmax:       0.78 m/s MV Vmean:      46.7 cm/s MV Decel Time: 256 msec MV E velocity: 45.40 cm/s MV A velocity: 67.70 cm/s MV E/A ratio:  0.67 Dalton McleanMD Electronically signed by Wilfred Lacy Signature Date/Time: 12/09/2023/1:22:54 PM    Final    [place] EKG SITE RITE Result Date: 12/09/2023 If Site Rite image not attached, placement could not be confirmed due to current cardiac rhythm.  DG Chest Port 1 View Result Date: 12/08/2023 CLINICAL DATA:  Shortness of breath. EXAM: PORTABLE CHEST 1 VIEW COMPARISON:  12/07/2023. FINDINGS: Patient is rotated. Trachea is midline. Heart size stable. Bibasilar streaky atelectasis. No airspace consolidation. There may be trace bilateral pleural effusions. IMPRESSION: 1. Trace bilateral pleural effusions. 2.  Bibasilar streaky atelectasis. Electronically Signed   By: Leanna Battles M.D.   On: 12/08/2023 09:29   [place] EKG SITE RITE Result Date: 12/08/2023 If [facility] image not attached, placement could not be confirmed due to current cardiac rhythm.  MR LUMBAR SPINE WO CONTRAST Result Date: 12/07/2023 CLINICAL DATA:  63 year old male with pain, age indeterminate lumbar compression fractures. EXAM: MRI LUMBAR SPINE WITHOUT CONTRAST TECHNIQUE: Multiplanar, multisequence MR imaging of the lumbar spine was performed. No intravenous contrast was administered. COMPARISON:  Lumbar spine CT last night. FINDINGS: Segmentation:  Normal on the comparison. Alignment: Stable, normal lumbar lordosis. No significant scoliosis or spondylolisthesis. Vertebrae: Background bone marrow signal is normal and there are chronic compression fractures of T12, L2 (moderate), L3, and L5. Minimal retropulsion, such as at the L2 posterosuperior endplate. Superimposed mild degenerative appearing endplate marrow edema posteriorly at L2-L3. Intact visible sacrum and SI joints. No other acute osseous abnormality. Conus  medullaris and cauda equina: Conus extends to the T12-L1 level. No lower spinal cord or conus signal abnormality. Normal cauda equina nerve roots. Capacious spinal canal. Paraspinal and other soft tissues: Susceptibility artifact anterior to the right sacrum related to pronounced right acetabular protrusion in the setting of hip arthroplasty, see CT Abdomen and Pelvis last night. Stable visible abdominal viscera. Mild symmetric and nonspecific bilateral erector spinae muscle STIR hyperintensity, such as on the left side series 4, image 19. No paraspinal fluid collection. Disc levels: Capacious spinal canal with no age advanced lumbar spine degeneration when considering the presence of multiple compression fractures. No lumbar spinal stenosis. There is mild bilateral L4 and left L5 degenerative neural foraminal stenosis. IMPRESSION: 1.  Chronic compression fractures of T12, L2, L3, and L5 with no complicating features. 2. Subtle and nonspecific bilateral erector spinae muscle edema. A similar appearance can be seen in chronically debilitated patients. 3. 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 4. Capacious lumbar spinal canal with no spinal stenosis. Electronically Signed   By: Odessa Fleming M.D.   On: 12/07/2023 08:43   DG Chest 2 View Result Date: 12/07/2023 CLINICAL DATA:  Suspected Sepsis abdominal pain EXAM: CHEST - 2 VIEW COMPARISON:  Chest x-ray 04/16/2022 FINDINGS: The heart and mediastinal contours are within normal limits. No focal consolidation. Chronic coarsened interstitial markings with no overt pulmonary edema. No pleural effusion. No pneumothorax. No acute osseous abnormality. Degenerative changes of the shoulders. IMPRESSION: No active cardiopulmonary disease. Electronically Signed   By: Tish Frederickson M.D.   On: 12/07/2023 00:32   CT L-SPINE NO CHARGE Result Date: 12/06/2023 CLINICAL DATA:  Acute nonlocalized abdominal pain EXAM: CT LUMBAR SPINE WITHOUT CONTRAST TECHNIQUE: Multidetector CT imaging of the lumbar spine was performed without intravenous contrast administration. Multiplanar CT image reconstructions were also generated. RADIATION DOSE REDUCTION: This exam was performed according to the departmental dose-optimization program which includes automated exposure control, adjustment of the mA and/or kV according to patient size and/or use of iterative reconstruction technique. COMPARISON:  CT abdomen and pelvis 05/04/2018 FINDINGS: Segmentation: 5 lumbar type vertebral bodies. Alignment: Normal alignment. Vertebrae: Compression of the L2 vertebra with about 40% loss of height. This is new since prior study. No retropulsion of fracture fragments. Compression of the superior endplate of L3 with about 20% loss of  height. This is new since the prior study. No retropulsion of fracture fragments. Compression of the superior endplate of L5 is unchanged since prior study. No destructive or expansile bone lesions. Paraspinal and other soft tissues: See additional report of CT abdomen and pelvis same date. Disc levels: Intervertebral disc space heights are mostly preserved. Mild endplate osteophyte formation. IMPRESSION: Compression deformities of L2 and L3, new since prior study. Acute compression is not excluded. No retropulsion of fracture fragments. Electronically Signed   By: Burman Nieves M.D.   On: 12/06/2023 23:53   CT ABDOMEN PELVIS W CONTRAST Result Date: 12/06/2023 CLINICAL DATA:  Acute nonlocalized abdominal pain. EXAM: CT ABDOMEN AND PELVIS WITH CONTRAST TECHNIQUE: Multidetector CT imaging of the abdomen and pelvis was performed using the standard protocol following bolus administration of intravenous contrast. RADIATION DOSE REDUCTION: This exam was performed according to the departmental dose-optimization program which includes automated exposure control, adjustment of the mA and/or kV according to patient size and/or use of iterative reconstruction technique. CONTRAST:  75mL OMNIPAQUE IOHEXOL 350 MG/ML SOLN COMPARISON:  05/04/2018 FINDINGS:  Lower chest: Emphysematous changes in the lung bases. Bronchiectasis and bronchial wall thickening with evidence of mucous plugging likely chronic bronchitis. Trace bilateral pleural effusions with basilar atelectasis. Prominent coronary artery calcifications. Hepatobiliary: No focal liver abnormality is seen. No gallstones, gallbladder wall thickening, or biliary dilatation. Pancreas: Unremarkable. No pancreatic ductal dilatation or surrounding inflammatory changes. Spleen: Normal in size without focal abnormality. Adrenals/Urinary Tract: No adrenal gland nodules. Chronic scarring in the kidneys. Heterogeneous nephrograms bilaterally suggest evidence of acute  pyelonephritis. No renal or ureteral obstruction. Visualized bladder is unremarkable. Portions of the bladder and pelvis are not visualized due to streak artifact from right hip arthroplasty. Stomach/Bowel: Stomach, small bowel, and colon are not abnormally distended. Stool fills the colon. There is a moderate-sized right inguinal hernia containing bowel but without obvious proximal obstruction. Visualization is limited due to the streak artifact. Vascular/Lymphatic: Calcification of the aorta. No aneurysm. Inferior vena caval filter. No significant lymphadenopathy. Reproductive: Prostate gland is mildly enlarged. Other: Small amount of free fluid along the pericolic gutters is nonspecific. No free air. 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 This is progressing since the previous study. Heterotopic ossification. Compression deformities of T12, L2, L3, and L5 demonstrate progression since the previous study. Acute compression is not excluded. Degenerative changes in the spine. Old rib fractures. IMPRESSION: 1. Emphysematous changes and chronic bronchitic changes in the lung bases. Trace bilateral pleural effusions. 2. Heterogeneous nephrograms bilaterally suggesting acute pyelonephritis. No abscess or hydronephrosis. 3. Moderate-sized right inguinal hernia containing bowel without obvious proximal obstruction. Visualization is limited due to streak artifact. 4. Aortic atherosclerosis.  Inferior vena caval filter. 5. Small amount of free fluid in the abdomen of nonspecific etiology. Electronically Signed   By: Burman Nieves M.D.   On: 12/06/2023 23:50    Labs:  CBC: Recent Labs    12/14/23 0305 12/15/23 0300 12/16/23 0228 12/17/23 0316  WBC 6.8 7.7 7.8 7.8  HGB 9.6* 9.6* 9.6* 9.7*  HCT 29.5* 30.0* 29.7* 29.4*  PLT 327 321 323 289    COAGS: Recent Labs    12/06/23 2203  INR 1.6*    BMP: Recent Labs    12/14/23 0305  12/15/23 0300 12/16/23 0228 12/17/23 0316  NA 132* 129* 129* 128*  K 4.0 4.3 4.1 3.7  CL 98 94* 94* 95*  CO2 24 25 24 27   GLUCOSE 103* 97 90 97  BUN 17 20 21 19   CALCIUM 8.8* 8.8* 8.8* 8.6*  CREATININE 0.87 0.86 0.86 0.88  GFRNONAA >60 >60 >60 >60    LIVER FUNCTION TESTS: Recent Labs    12/14/23 0305 12/15/23 0300 12/16/23 0228 12/17/23 0316  BILITOT 0.5 0.7 0.6 0.6  AST 21 22 23 22   ALT 17 17 18 18   ALKPHOS 67 67 67 68  PROT 7.3 7.3 7.6 7.3  ALBUMIN 2.0* 2.0* 2.0* 2.0*    TUMOR MARKERS: No results for input(s): "AFPTM", "CEA", "CA199", "CHROMGRNA" in the last 8760 hours.  Assessment and Plan:  [name] [name] is a 63 y.o. 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 Initially presented to [facility] ED 12/06/23 with abdominal pain and lower back pain.Initial CT on 12/06/23 showed  Right hip arthroplasty with protrusio acetabula and thinning of the wall of the acetabulum.  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 IR was initially contacted 12/11/23, but pt had declined any intervention at that time. Today, 12/17/23, pt now reportedly agreeable to proceed.   Dr. Miles Costain reviewed case and imaging, and agreeable to proceed. Pt had eaten this morning and taken his eliquis this morning. Will hold today PM and tomorrow AM dose of eliquis prior to procedure and keep pt NPO at midnight. Plan for procedure tomorrow.  Risks and benefits discussed with the patient including bleeding, infection, damage to adjacent structures, bowel perforation/fistula connection, and sepsis. All of the patient's questions were answered, patient  is agreeable to proceed. Consent signed and in chart.   Thank you for allowing our service to participate in [name] [name] 's care.  Electronically Signed: Katheren Puller, PA-C   12/17/2023, 3:32 PM      I spent a total of 40 Minutes    in face to face in clinical consultation, greater than 50% of which was counseling/coordinating care for right hip joint aspiration and drainage

## 2023-12-17 NOTE — Progress Notes (Signed)
 Daily Progress Note   Patient Name: [name] [name]       Date: 12/17/2023 DOB: 1961/05/21  Age: 63 y.o. MRN#: [id number] Attending Physician: Leroy Sea, MD Primary Care Physician: Patient, No Pcp Per Admit Date: 12/06/2023 Length of Stay: 10 days  Reason for Consultation/Follow-up: Establishing goals of care  HPI/Patient Profile:  63 y.o. 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  He was admitted on 12/06/2023 with MRSA bacteremia, C2-C3 osteomyelitis, right hip prosthetic joint infection, previous suspicion of tricuspid valve endocarditis, and others.    Palliative medicine was consulted for GOC conversations.  Subjective:   Subjective: Chart Reviewed. Updates received. Patient Assessed. Created space and opportunity for patient  and family to explore thoughts and feelings regarding current medical situation.  Today's Discussion: Before seeing the patient at the bedside I spent time reviewing the chart.  Patient overall has been stable, IR has seen the patient at the bedside.  They have signed consent and the patient remains agreeable to IR procedure for biopsy/fluid collection.  I saw the patient at bedside and he was apparently sleeping, but opened my eyes to a light not.  Today he still having abdominal pain, declines me pressing on it.  States that his morning he "saw spots" then lower rib cage.  Also with some hip pain/leg pain which is not surprising given his infection.  We discussed IR procedure today and he affirmed that he was agreeable.  I asked about timing and he said they told him it would be sometime later this evening.  He asked me to pray  for him because he is worried about this procedure.  I assured him I will.  We talked about possibly working with therapy and if he would be agreeable tomorrow.  He would like to hold off for another day.  I reminded him that it is very important to get out of the bed so he does not lose function and be at risk for other infections.  He agreed but would still like to give it another day.  I told him I would be back in a couple days we can talk about it again.  I informed him I will be back on Thursday to follow-up post procedure and to discuss therapy and further goals moving forward. I provided emotional and general support through therapeutic listening, empathy, sharing of stories, and other techniques. I answered all questions and addressed all concerns to the best of my ability.  Review of Systems  Respiratory:  Negative for shortness of breath.   Cardiovascular:  Negative for chest pain.  Gastrointestinal:  Positive for abdominal pain. Negative for nausea and vomiting.  Musculoskeletal:        Hip and leg pain    Objective:   Vital Signs:  BP 111/67 (BP Location: Left Arm)   Pulse 76   Temp 98.3 F (36.8 C) (Oral)   Resp 20   Ht 6\' 1"  (1.854 m)   Wt 56.4 kg   SpO2 98%   BMI 16.40 kg/m   Physical Exam Vitals and nursing note reviewed.  HENT:     Head: Normocephalic and atraumatic.  Cardiovascular:     Rate and Rhythm:  Normal rate.  Pulmonary:     Effort: Pulmonary effort is normal. No respiratory distress.  Abdominal:     General: Abdomen is flat. There is no distension.     Palpations: Abdomen is soft.     Tenderness: There is abdominal tenderness.  Skin:    General: Skin is warm and dry.  Neurological:     General: No focal deficit present.     Mental Status: He is alert and oriented to person, place, and time.  Psychiatric:        Mood and Affect: Mood normal.        Behavior: Behavior normal.     Palliative Assessment/Data: 50-60%    Existing Vynca/ACP  Documentation: None  Assessment & Plan:   Impression: Present on Admission:  MRSA bacteremia  63 year old male with acute presentation chronic comorbidities as described above. The patient has a significant infection likely from hip joint infection. He refused attempted IR aspiration. After our discussion today he is agreeable to reattempt IR aspiration, open to discussing surgical options with orthopedic surgery after aspiration. Also seems like he will be agreeable to next day or 2 to reengage with PT/OT. Palliative medicine will continue to follow. Overall prognosis guarded to poor pending participation and the medical plan.   SUMMARY OF RECOMMENDATIONS   Full code Full scope of care Anticipate IR aspiration today Open to discussion with orthopedic surgery after results Would like to wait until Thursday to reconsider PT/OT Palliative medicine will follow-up on/11/2023  Symptom Management:  Per primary team PMT is available to assist as needed  Code Status: Full code  Prognosis: Unable to determine  Discharge Planning: To Be Determined  Discussed with: Patient, medical team, nursing team  Thank you for allowing [place] to participate in the care of [name] [name] PMT will continue to support holistically.  Time Total: 29 min  Detailed review of medical records (labs, imaging, vital signs), medically appropriate exam, discussed with treatment team, counseling and education to patient, family, & staff, documenting clinical information, medication management, coordination of care  Wynne Dust, NP Palliative Medicine Team  Team Phone # [phone] (Nights/Weekends)  05/16/2021, 8:17 AM

## 2023-12-18 ENCOUNTER — Inpatient Hospital Stay (HOSPITAL_COMMUNITY): Payer: MEDICAID

## 2023-12-18 DIAGNOSIS — B9562 Methicillin resistant Staphylococcus aureus infection as the cause of diseases classified elsewhere: Secondary | ICD-10-CM | POA: Diagnosis not present

## 2023-12-18 DIAGNOSIS — R7881 Bacteremia: Secondary | ICD-10-CM | POA: Diagnosis not present

## 2023-12-18 LAB — VANCOMYCIN, TROUGH: Vancomycin Tr: 14 ug/mL — ABNORMAL LOW (ref 15–20)

## 2023-12-18 MED ORDER — MIDAZOLAM HCL 2 MG/2ML IJ SOLN
INTRAMUSCULAR | Status: AC
  Filled 2023-12-18: qty 4

## 2023-12-18 MED ORDER — LIDOCAINE HCL (PF) 1 % IJ SOLN
10.0000 mL | Freq: Once | INTRAMUSCULAR | Status: AC
  Administered 2023-12-18: 10 mL

## 2023-12-18 MED ORDER — HYDROMORPHONE HCL 1 MG/ML IJ SOLN
INTRAMUSCULAR | Status: AC
  Filled 2023-12-18: qty 2

## 2023-12-18 MED ORDER — HYDROMORPHONE HCL 1 MG/ML IJ SOLN
INTRAMUSCULAR | Status: AC | PRN
  Administered 2023-12-18: .5 mg via INTRAVENOUS

## 2023-12-18 MED ORDER — DIPHENHYDRAMINE HCL 50 MG/ML IJ SOLN
INTRAMUSCULAR | Status: AC
  Filled 2023-12-18: qty 1

## 2023-12-18 MED ORDER — MIDAZOLAM HCL 2 MG/2ML IJ SOLN
INTRAMUSCULAR | Status: AC | PRN
  Administered 2023-12-18: 1 mg via INTRAVENOUS
  Administered 2023-12-18: .5 mg via INTRAVENOUS

## 2023-12-18 MED ORDER — VANCOMYCIN HCL IN DEXTROSE 1-5 GM/200ML-% IV SOLN
1000.0000 mg | INTRAVENOUS | Status: DC
  Administered 2023-12-19 – 2023-12-24 (×6): 1000 mg via INTRAVENOUS
  Filled 2023-12-18 (×6): qty 200

## 2023-12-18 NOTE — Progress Notes (Signed)
 PROGRESS NOTE                                                                                                                                                                                                             Patient Demographics:    [name] [name], is a 63 y.o. male, DOB - 1961/02/01, ZOX:[id number]  Outpatient Primary MD for the patient is Patient, No Pcp Per    LOS - 11  Admit date - 12/06/2023    Chief Complaint  Patient presents with   Abdominal Pain       Brief Narrative (HPI from H&P)     63 y.o. 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   He did not want to go back to [facility], he was also due for a TEE and was admitted to the hospital here for further care.   Subjective:   No chest pain, no shortness of breath, complains of right hip pain.   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  Being treated for MRSA bacteremia through IV vancomycin under the guidance of  cardiology and ID.  Per neurosurgeon Dr. 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  IR tried to aspirate his right hip abscess and fluid collection but patient refused earlier in the admission.  He has refused multiple tests and procedures this admission despite counseling.  Eventually he has agreed to undergo aspiration on 12/17/2023 after palliative care was consulted for goals of care. -  IR to perform fluid aspiration today, follow on Fluid cell count with differential, cytology, Gram stain and culture ordered. -ED and Ortho on board.  For now continue IV Vancomycin with stop date 01/17/2024 via PICC line, he is homeless, IV drug user, not compliant with appointments and ID feels that prolonged IV antibiotics in the hospital is the safest course for now.  Of note patient was to get Dalbavancin prior to [facility] discharge [facility] but appears that he never received it.    C2-C3 osteomyelitis, lower back pain, compression fractures in lumbar spine vertebrae, T12, L2-L3 L5 - repeat MRI L-spine this admission stable, supportive care.  No signs of acute infection, C-spine MRI changes discussed with Dr. Conchita Paris, medical treatment.  Supportive care for brain, history of narcotic seeking behavior.   Lactic acidosis - due to dehydration.  Has been hydrated.   Chronic HFrEF - LVEF 20 to 25%, cardiology following.  Continue on Aldactone further diuretics per cardiology, euvolemic currently.   Possible acute pyelonephritis seen on CT scan, POA  - Continue IV antibiotics, As needed analgesics, negative urine culture.   History of PE/DVT on Eliquis  Resume home Eliquis also has IVC filter.   History of polysubstance abuse  Denies recent use of IV drugs, most recently his urine drug screen was positive on care everywhere at [facility] for amphetamine and cocaine in February 2025.   Tobacco use disorder  - Current smoker, Nicotine patch   Generalized  weakness  PT OT assessment, Fall precautions   Severe protein calorie malnutrition - Dietitian consult, protein supplementation  Hyponatremia.  SIADH, received a dose of Samsca with good effect, counseled on fluid restriction, he is not compliant with fluid restriction and sodium gradually trending down, counseled again.  Continue fluid restriction and monitor.  Incidental finding of right inguinal hernia.  No acute issues.  Monitor   No IV access.  PICC line placed 12/09/2023 after much counseling       Condition - Extremely Guarded  Family Communication  :  None present  Code Status :   Full  Consults  :  ID, Cards, Dr. Conchita Paris Neurosurgery, orthopedics, IR, palliative care  PUD Prophylaxis :  Pepcid   Procedures  :     PICC line placed 12/09/2023   CT R Hip - 1. Status post total right hip arthroplasty. 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 2. There is complex joint fluid inferior to the right femoral neck prosthesis with mild-to-moderate joint capsule thickening. This fluid extends into the right obturator externus muscle and into the right iliopsoas bursa. There also appears to be posterior extension of joint fluid into a fluid collection wrapping around the posterior aspect of the right greater trochanter and extending superiorly into the right gluteus medius muscle. This is suspicious for right humerus however septic arthritis with the infected joint fluid extending peripherally. 3. Moderate right gluteus minimus and medius muscle edema. Focal edema within the anterior aspect of the left gluteus medius muscle. 4. There is metallic susceptibility artifact in the region of the posterior right hip/buttock. Recommend clinical correlation for possible surgical skin staples in  this region.   TTE - 1. Left ventricular ejection fraction, by estimation, is 20 to 25%. The left ventricle has severely decreased function. The left ventricle demonstrates global hypokinesis. The left  ventricular internal cavity size was mildly dilated. Left ventricular diastolic parameters are consistent with Grade I diastolic dysfunction (impaired relaxation). No LV thrombus noted.  2. Right ventricular systolic function is mildly reduced. The right ventricular size is normal. Tricuspid regurgitation signal is inadequate for assessing PA pressure.  3. The mitral valve is normal in structure. No evidence of mitral valve regurgitation. No evidence of mitral stenosis. Moderate mitral annular calcification.  4. The aortic valve was not well visualized. Aortic valve regurgitation is not visualized. No aortic stenosis is present.  5. IVC not visualized.  MRI brain.  Nonacute.    MRI C-spine.   Severely motion limited noncontrast study. Significant marrow edema surrounding the left C2-C3 facet joint and extending to involve the posterior left C2 and C3 vertebral bodies with surrounding paraspinal edema, concerning for septic arthritis and possibly discitis/osteomyelitis given the reported clinical concern for infection. A repeat MRI of the cervical spine with contrast is recommended when the patient is able (possibly with sedation) to better evaluate.  MRI L-spine.  1. Chronic compression fractures of T12, L2, L3, and L5 with no complicating features. 2. Subtle and nonspecific bilateral erector spinae muscle edema. A similar appearance can be seen in chronically debilitated patients. 3. 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 4. Capacious lumbar spinal canal with no spinal stenosis  CT scan abdomen pelvis.1. Emphysematous changes and chronic bronchitic changes in the lung bases. Trace bilateral  pleural effusions. 2. Heterogeneous nephrograms bilaterally suggesting acute pyelonephritis. No abscess or hydronephrosis. 3. Moderate-sized right inguinal hernia containing bowel without obvious proximal obstruction. Visualization is limited due to streak artifact. 4. Aortic atherosclerosis.  Inferior vena caval filter. 5. Small amount of free fluid in the abdomen of nonspecific etiology      Disposition Plan  :    Status is: Inpatient   DVT Prophylaxis  :     apixaban (ELIQUIS) tablet 5 mg     Lab Results  Component Value Date   PLT 289 12/17/2023    Diet :  Diet Order             Diet NPO time specified  Diet effective midnight                    Inpatient Medications  Scheduled Meds:  apixaban  5 mg Oral BID   atorvastatin  80 mg Oral QHS   carvedilol  3.125 mg Oral BID WC   Chlorhexidine Gluconate Cloth  6 each Topical Daily   famotidine  20 mg Oral Daily   feeding supplement  237 mL Oral BID BM   multivitamin with minerals  1 tablet Oral Daily   saccharomyces boulardii  250 mg Oral BID   spironolactone  25 mg Oral Daily   Continuous Infusions:  vancomycin 1,250 mg (12/17/23 1325)   PRN Meds:.acetaminophen, HYDROmorphone (DILAUDID) injection, melatonin, naLOXone (NARCAN)  injection, oxyCODONE, polyethylene glycol, prochlorperazine, sodium chloride flush     Objective:   Vitals:   12/17/23 2000 12/18/23 0403 12/18/23 0414 12/18/23 0859  BP: 103/69 122/68  (!) 143/76  Pulse: 71   67  Resp: 18 20  18   Temp: 98.4 F (36.9 C) 98.2 F (36.8 C)  98.1 F (36.7 C)  TempSrc: Oral Oral  Oral  SpO2:    97%  Weight:   56.2 kg   Height:  Wt Readings from Last 3 Encounters:  12/18/23 56.2 kg  10/23/18 72.6 kg  09/09/18 70.3 kg     Intake/Output Summary (Last 24 hours) at 12/18/2023 1003 Last data filed at 12/18/2023 0900 Gross per 24 hour  Intake --  Output 650 ml  Net -650 ml      Physical Exam  Awake Alert, Oriented X 3,chronically  ill appearing, appears older than stated age Symmetrical Chest wall movement, Good air movement bilaterally, CTAB RRR,No Gallops,Rubs or new Murmurs, No Parasternal Heave +ve B.Sounds, Abd Soft, No tenderness, No rebound - guarding or rigidity. No Cyanosis, Clubbing or edema, No new Rash or bruise     Data Review:    Recent Labs  Lab 12/13/23 0403 12/14/23 0305 12/15/23 0300 12/16/23 0228 12/17/23 0316  WBC 6.9 6.8 7.7 7.8 7.8  HGB 9.1* 9.6* 9.6* 9.6* 9.7*  HCT 28.2* 29.5* 30.0* 29.7* 29.4*  PLT 318 327 321 323 289  MCV 88.1 87.8 88.0 88.7 87.2  MCH 28.4 28.6 28.2 28.7 28.8  MCHC 32.3 32.5 32.0 32.3 33.0  RDW 15.9* 15.9* 15.9* 16.1* 16.1*  LYMPHSABS 0.9 1.0 1.0 1.2 1.0  MONOABS 0.7 0.7 0.7 0.8 0.7  EOSABS 0.2 0.3 0.3 0.5 0.5  BASOSABS 0.0 0.0 0.1 0.0 0.0    Recent Labs  Lab 12/12/23 0330 12/13/23 0403 12/14/23 0305 12/15/23 0300 12/16/23 0228 12/17/23 0316  NA 125* 127* 132* 129* 129* 128*  K 3.6 4.1 4.0 4.3 4.1 3.7  CL 89* 95* 98 94* 94* 95*  CO2 26 25 24 25 24 27   ANIONGAP 10 7 10 10 11 6   GLUCOSE 99 98 103* 97 90 97  BUN 27* 20 17 20 21 19   CREATININE 0.98 0.81 0.87 0.86 0.86 0.88  AST 20 21 21 22 23 22   ALT 16 17 17 17 18 18   ALKPHOS 63 59 67 67 67 68  BILITOT 0.9 0.6 0.5 0.7 0.6 0.6  ALBUMIN 1.9* 1.9* 2.0* 2.0* 2.0* 2.0*  CRP 2.9* 2.7* 2.7* 2.2* 2.3*  --   PROCALCITON  --  <0.10 <0.10 <0.10 <0.10 <0.10  BNP 178.0*  --   --   --   --   --   MG 1.7 1.7 1.8 1.7 1.7  --   CALCIUM 8.4* 8.4* 8.8* 8.8* 8.8* 8.6*      Recent Labs  Lab 12/12/23 0330 12/13/23 0403 12/14/23 0305 12/15/23 0300 12/16/23 0228 12/17/23 0316  CRP 2.9* 2.7* 2.7* 2.2* 2.3*  --   PROCALCITON  --  <0.10 <0.10 <0.10 <0.10 <0.10  BNP 178.0*  --   --   --   --   --   MG 1.7 1.7 1.8 1.7 1.7  --   CALCIUM 8.4* 8.4* 8.8* 8.8* 8.8* 8.6*    --------------------------------------------------------------------------------------------------------------- Lab Results  Component Value  Date   CHOL 114 12/09/2023   HDL 43 12/09/2023   LDLCALC 59 12/09/2023   TRIG 59 12/09/2023   CHOLHDL 2.7 12/09/2023    No results found for: "HGBA1C" No results for input(s): "TSH", "T4TOTAL", "FREET4", "T3FREE", "THYROIDAB" in the last 72 hours. No results for input(s): "VITAMINB12", "FOLATE", "FERRITIN", "TIBC", "IRON", "RETICCTPCT" in the last 72 hours. ------------------------------------------------------------------------------------------------------------------ Cardiac Enzymes No results for input(s): "CKMB", "TROPONINI", "MYOGLOBIN" in the last 168 hours.  Invalid input(s): "CK"  Micro Results No results found for this or any previous visit (from the past 240 hours).   Radiology Report No results found.    Signature  -   Set designer  M.D on 12/18/2023 at 10:03 AM   -  To page go to [email or web address]

## 2023-12-18 NOTE — TOC Progression Note (Signed)
 Transition of Care [facility]) - Progression Note    Patient Details  Name: [name] [name] MRN: [id number] Date of Birth: [birth year]  Transition of Care [facility]) CM/SW Contact  Mearl Latin, LCSW Phone Number: 12/18/2023, 2:02 PM  Clinical Narrative:    Patient remains hospitalized for IV abx.      Barriers to Discharge: Continued Medical Work up, Inadequate or no insurance, SNF Pending bed offer, Active Substance Use - Placement  Expected Discharge Plan and Services In-house Referral: Clinical Social Work   Post Acute Care Choice: Skilled Nursing Facility Living arrangements for the past 2 months: Homeless, Hotel/Motel                                       Social Determinants of Health (SDOH) Interventions SDOH Screenings   Food Insecurity: Medium Risk (11/24/2023)   Received from [facility]  Housing: High Risk (11/24/2023)   Received from [facility]  Transportation Needs: Unmet Transportation Needs (11/24/2023)   Received from [facility]  Utilities: Unknown (11/24/2023)   Received from [facility]  Recent Concern: Utilities - High Risk (11/04/2023)   Received from [facility]  Financial Resource Strain: Low Risk  (06/22/2022)   Received from [facility]  Social Connections: Unknown (01/26/2022)   Received from [facility]  Stress: No Stress Concern Present (07/01/2023)   Received from [facility]  Tobacco Use: High Risk (12/06/2023)    Readmission Risk Interventions     No data to display

## 2023-12-18 NOTE — Procedures (Signed)
 Interventional Radiology Procedure Note  Procedure: Image guided aspiration of right acetabular fluid collection.   Complications: None  EBL: None Sample: Culture sent  Recommendations: - Routine wound care - follow up Cx - ok to advance diet per primary  Signed,  Yvone Neu. Loreta Ave, DO, ABVM, RPVI

## 2023-12-18 NOTE — Plan of Care (Signed)

## 2023-12-18 NOTE — Plan of Care (Signed)
  Problem: Education: Goal: Knowledge of General Education information will improve Description: Including pain rating scale, medication(s)/side effects and non-pharmacologic comfort measures Outcome: Progressing   Problem: Health Behavior/Discharge Planning: Goal: Ability to manage health-related needs will improve Outcome: Progressing   Problem: Clinical Measurements: Goal: Ability to maintain clinical measurements within normal limits will improve Outcome: Progressing Goal: Will remain free from infection Outcome: Progressing Goal: Diagnostic test results will improve Outcome: Progressing Goal: Respiratory complications will improve Outcome: Progressing Goal: Cardiovascular complication will be avoided Outcome: Progressing   Problem: Nutrition: Goal: Adequate nutrition will be maintained Outcome: Progressing   Problem: Elimination: Goal: Will not experience complications related to bowel motility Outcome: Progressing Goal: Will not experience complications related to urinary retention Outcome: Progressing   Problem: Safety: Goal: Ability to remain free from injury will improve Outcome: Progressing   Problem: Skin Integrity: Goal: Risk for impaired skin integrity will decrease Outcome: Progressing

## 2023-12-18 NOTE — Progress Notes (Signed)
 Pharmacy Antibiotic Note  [name] [name] is a 63 y.o. male admitted on 12/06/2023 with back pain, possible osteomyelitis.  Admitted 3/8-3/21 at [facility] for MRSA bacteremia and possible endocarditis/osteomyelitis --received Vancomycin throughout admission.  Plan was for discharge 3/21 and administration of Dalvancin weekly, but dose not given.  Pharmacy has been consulted for Vancomycin  dosing.   Per documentation last levels at [facility] on 3/19 on 750mg  q24h with trough of 283 (trough: 7.2 and peak 16.2), adjusted regimen to 1250mg  q24h for eAUC ~480. Scr around 0.8 during this time.   4/1 Vancomycin 1,250 mg IV Q24H- dose administered 1325 (infusion time 1.5h)  4/1 Vanc peak 39- drawn 16:39 (appropriate timing)  4/1 Vanc trough 14- 4/2 11:26 (appropriate timing)  AUC calculated based on levels: ~900   Patient previously on a 1g Q24H. Now with more stable renal function.    Plan: Decrease vancomyciin to 1 g IV Q24H  Recheck levels at [facility]  Planning to continue Vancomycin therapy while inpt through 5/2   Height: 6\' 1"  (185.4 cm) Weight: 56.2 kg (123 lb 14.4 oz) IBW/kg (Calculated) : 79.9  Temp (24hrs), Avg:98.3 F (36.8 C), Min:98.2 F (36.8 C), Max:98.4 F (36.9 C)  Recent Labs  Lab 12/13/23 0403 12/14/23 0305 12/15/23 0300 12/16/23 0228 12/17/23 0316 12/17/23 1639  WBC 6.9 6.8 7.7 7.8 7.8  --   CREATININE 0.81 0.87 0.86 0.86 0.88  --   VANCOPEAK  --   --   --   --   --  39    Estimated Creatinine Clearance: 68.3 mL/min (by C-G formula based on SCr of 0.88 mg/dL).    Allergies  Allergen Reactions   Fentanyl Itching   Hydromorphone Hcl Itching    Pt  States itching all over Pt  States itching all over Pt  States itching all over    Morphine Itching   Pantoprazole Hives   Pantoprazole Sodium Hives   Haloperidol Other (See Comments)    Pt reports he has "seizures" had haldol on 09/25/21 with no apparent ill effect.  Pt reports he has "seizures"    Nsaids Nausea And Vomiting, Other (See Comments) and Hives    Severe cramping Other reaction(s): Abdominal Pain Cramping per    Morphine And Codeine Itching   Ibuprofen    Ketorolac Other (See Comments)   Pantoprazole Sodium Hives    Jani Gravel, PharmD Clinical Pharmacist  12/18/2023 12:49 PM

## 2023-12-19 DIAGNOSIS — B9562 Methicillin resistant Staphylococcus aureus infection as the cause of diseases classified elsewhere: Secondary | ICD-10-CM | POA: Diagnosis not present

## 2023-12-19 DIAGNOSIS — R64 Cachexia: Secondary | ICD-10-CM | POA: Diagnosis not present

## 2023-12-19 DIAGNOSIS — R7881 Bacteremia: Secondary | ICD-10-CM | POA: Diagnosis not present

## 2023-12-19 DIAGNOSIS — Z7189 Other specified counseling: Secondary | ICD-10-CM | POA: Diagnosis not present

## 2023-12-19 DIAGNOSIS — Z515 Encounter for palliative care: Secondary | ICD-10-CM | POA: Diagnosis not present

## 2023-12-19 LAB — SYNOVIAL CELL COUNT + DIFF, W/ CRYSTALS
Eosinophils-Synovial: 0 % (ref 0–1)
Lymphocytes-Synovial Fld: 3 % (ref 0–20)
Monocyte-Macrophage-Synovial Fluid: 6 % — ABNORMAL LOW (ref 50–90)
Neutrophil, Synovial: 91 % — ABNORMAL HIGH (ref 0–25)
WBC, Synovial: UNDETERMINED /mm3 (ref 0–200)

## 2023-12-19 LAB — BASIC METABOLIC PANEL WITH GFR
Anion gap: 9 (ref 5–15)
BUN: 23 mg/dL (ref 8–23)
CO2: 23 mmol/L (ref 22–32)
Calcium: 8.7 mg/dL — ABNORMAL LOW (ref 8.9–10.3)
Chloride: 94 mmol/L — ABNORMAL LOW (ref 98–111)
Creatinine, Ser: 0.81 mg/dL (ref 0.61–1.24)
GFR, Estimated: >60 mL/min (ref >60)
Glucose, Bld: 99 mg/dL (ref 70–99)
Potassium: 4.1 mmol/L (ref 3.5–5.1)
Sodium: 126 mmol/L — ABNORMAL LOW (ref 135–145)

## 2023-12-19 MED ORDER — MAGNESIUM CITRATE PO SOLN
1.0000 | Freq: Once | ORAL | Status: AC
  Administered 2023-12-19: 1 via ORAL
  Filled 2023-12-19: qty 296

## 2023-12-19 NOTE — Care Management (Signed)
 Patient request his cash and credit card to be locked up by security. The charge nurse and myself counted cash to be locked up in front of patient. Cash totalled $130, plus his [employer] card.

## 2023-12-19 NOTE — Progress Notes (Signed)
 PROGRESS NOTE                                                                                                                                                                                                             Patient Demographics:    [name] [name], is a 63 y.o. male, DOB - 04-10-1961, ZOX:[id number]  Outpatient Primary MD for the patient is Patient, No Pcp Per    LOS - 12  Admit date - 12/06/2023    Chief Complaint  Patient presents with   Abdominal Pain       Brief Narrative (HPI from H&P)     63 y.o. 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   He did not want to go back to [facility], he was also due for a TEE and was admitted to the hospital here for further care.   Subjective:   No chest pain, no shortness of breath, reports constipation.   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  Being treated for MRSA bacteremia through IV vancomycin under the guidance of  cardiology and ID.  Per neurosurgeon Dr. 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  IR tried to aspirate his right hip abscess and fluid collection but patient refused earlier in the admission.  He has refused multiple tests and procedures this admission despite counseling.  Eventually he has agreed to undergo aspiration on 12/17/2023 after palliative care was consulted for goals of care. -Status post right  prostatic hip joint fluid aspiration by IR/2., follow on Fluid cell count with differential, cytology, Gram stain and culture ordered. -ID and Ortho on board.   For now continue IV Vancomycin with stop date 01/17/2024 via PICC line, he is homeless, IV drug user, not compliant with appointments and ID feels that prolonged IV antibiotics in the hospital is the safest course for now.  Of note patient was to get Dalbavancin prior to [facility] but appears that he never received it.    C2-C3 osteomyelitis, lower back pain, compression fractures in lumbar spine vertebrae, T12, L2-L3 L5 - repeat MRI L-spine this admission stable, supportive care.  No signs of acute infection, C-spine MRI changes discussed with Dr. Conchita Paris, medical treatment.  Supportive care for brain, history of narcotic seeking behavior.   Lactic acidosis - due to dehydration.  Has been hydrated.   Chronic HFrEF - LVEF 20 to 25%, cardiology following.  Continue on Aldactone further diuretics per cardiology, euvolemic currently.   Possible acute pyelonephritis seen on CT scan, POA  - Continue IV antibiotics, As needed analgesics, negative urine culture.   History of PE/DVT on Eliquis  Resume home Eliquis also has IVC filter.   History of polysubstance abuse  Denies recent use of IV drugs, most recently his urine drug screen was positive on care everywhere at [facility] for amphetamine and cocaine in February 2025.   Tobacco use disorder  - Current smoker, Nicotine  patch   Generalized weakness  PT OT assessment, Fall precautions   Severe protein calorie malnutrition - Dietitian consult, protein supplementation  Hyponatremia.  SIADH, received a dose of Samsca with good effect, counseled on fluid restriction, he is not compliant with fluid restriction and sodium gradually trending down, counseled again.  Continue fluid restriction and monitor.  Incidental finding of right inguinal hernia.  No acute issues.  Monitor  Hep C infection -Management as an outpatient  No IV access.  PICC line placed 12/09/2023 after much counseling       Condition - Extremely Guarded  Family Communication  :  None present  Code Status :   Full  Consults  :  ID, Cards, Dr. Conchita Paris Neurosurgery, orthopedics, IR, palliative care  PUD Prophylaxis :  Pepcid   Procedures  :     PICC line placed 12/09/2023   CT R Hip - 1. Status post total right hip arthroplasty. 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 2. There is complex joint fluid inferior to the right femoral neck prosthesis with mild-to-moderate joint capsule thickening. This fluid extends into the right obturator externus muscle and into the right iliopsoas bursa. There also appears to be posterior extension of joint fluid into a fluid collection wrapping around the posterior aspect of the right greater trochanter and extending superiorly into the right gluteus medius muscle. This is suspicious for right humerus however septic arthritis with the infected joint fluid extending peripherally. 3. Moderate right gluteus minimus and medius muscle edema. Focal edema within the anterior aspect of the left gluteus medius muscle. 4. There is metallic susceptibility artifact in the region of the posterior right hip/buttock.  Recommend clinical correlation for possible surgical skin staples in this region.   TTE - 1. Left ventricular ejection fraction, by estimation, is 20 to 25%. The left ventricle has severely decreased  function. The left ventricle demonstrates global hypokinesis. The left ventricular internal cavity size was mildly dilated. Left ventricular diastolic parameters are consistent with Grade I diastolic dysfunction (impaired relaxation). No LV thrombus noted.  2. Right ventricular systolic function is mildly reduced. The right ventricular size is normal. Tricuspid regurgitation signal is inadequate for assessing PA pressure.  3. The mitral valve is normal in structure. No evidence of mitral valve regurgitation. No evidence of mitral stenosis. Moderate mitral annular calcification.  4. The aortic valve was not well visualized. Aortic valve regurgitation is not visualized. No aortic stenosis is present.  5. IVC not visualized.  MRI brain.  Nonacute.    MRI C-spine.   Severely motion limited noncontrast study. Significant marrow edema surrounding the left C2-C3 facet joint and extending to involve the posterior left C2 and C3 vertebral bodies with surrounding paraspinal edema, concerning for septic arthritis and possibly discitis/osteomyelitis given the reported clinical concern for infection. A repeat MRI of the cervical spine with contrast is recommended when the patient is able (possibly with sedation) to better evaluate.  MRI L-spine.  1. Chronic compression fractures of T12, L2, L3, and L5 with no complicating features. 2. Subtle and nonspecific bilateral erector spinae muscle edema. A similar appearance can be seen in chronically debilitated patients. 3. 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 4. Capacious lumbar spinal canal with no spinal stenosis  CT scan abdomen pelvis.1. Emphysematous changes  and chronic bronchitic changes in the lung bases. Trace bilateral pleural effusions. 2. Heterogeneous nephrograms bilaterally suggesting acute pyelonephritis. No abscess or hydronephrosis. 3. Moderate-sized right inguinal hernia containing bowel without obvious proximal obstruction. Visualization is limited due to streak artifact. 4. Aortic atherosclerosis.  Inferior vena caval filter. 5. Small amount of free fluid in the abdomen of nonspecific etiology      Disposition Plan  :    Status is: Inpatient   DVT Prophylaxis  :     apixaban (ELIQUIS) tablet 5 mg     Lab Results  Component Value Date   PLT 289 12/17/2023    Diet :  Diet Order             Diet regular Room service appropriate? Yes; Fluid consistency: Thin; Fluid restriction: 1200 mL Fluid  Diet effective now                    Inpatient Medications  Scheduled Meds:  apixaban  5 mg Oral BID   atorvastatin  80 mg Oral QHS   carvedilol  3.125 mg Oral BID WC   Chlorhexidine Gluconate Cloth  6 each Topical Daily   famotidine  20 mg Oral Daily   feeding supplement  237 mL Oral BID BM   magnesium citrate  1 Bottle Oral Once   multivitamin with minerals  1 tablet Oral Daily   saccharomyces boulardii  250 mg Oral BID   spironolactone  25 mg Oral Daily   Continuous Infusions:  vancomycin 1,000 mg (12/19/23 1137)   PRN Meds:.acetaminophen, HYDROmorphone (DILAUDID) injection, melatonin, naLOXone (NARCAN)  injection, oxyCODONE, polyethylene glycol, prochlorperazine, sodium chloride flush     Objective:   Vitals:   12/18/23 1929 12/19/23 0500 12/19/23 0812 12/19/23 1140  BP: 105/73  129/70 111/66  Pulse: 73  80 76  Resp: 18  19 20   Temp: 98.2 F (36.8 C)  98.2 F (36.8 C) 97.8 F (36.6 C)  TempSrc: Oral  Oral Oral  SpO2: 98%  96% 97%  Weight:  54.6 kg    Height:        Wt Readings from Last 3 Encounters:  12/19/23 54.6 kg  10/23/18 72.6 kg  09/09/18 70.3 kg     Intake/Output Summary (Last 24  hours) at 12/19/2023 1638 Last data filed at 12/19/2023 1539 Gross per 24 hour  Intake 600 ml  Output 1650 ml  Net -1050 ml      Physical Exam  Awake Alert, Oriented X 3,chronically ill appearing, appears older than stated age Symmetrical Chest wall movement, Good air movement bilaterally, CTAB RRR,No Gallops,Rubs or new Murmurs, No Parasternal Heave +ve B.Sounds, Abd Soft, No tenderness, No rebound - guarding or rigidity. No Cyanosis, Clubbing or edema, No new Rash or bruise      Data Review:    Recent Labs  Lab 12/13/23 0403 12/14/23 0305 12/15/23 0300 12/16/23 0228 12/17/23 0316  WBC 6.9 6.8 7.7 7.8 7.8  HGB 9.1* 9.6* 9.6* 9.6* 9.7*  HCT 28.2* 29.5* 30.0* 29.7* 29.4*  PLT 318 327 321 323 289  MCV 88.1 87.8 88.0 88.7 87.2  MCH 28.4 28.6 28.2 28.7 28.8  MCHC 32.3 32.5 32.0 32.3 33.0  RDW 15.9* 15.9* 15.9* 16.1* 16.1*  LYMPHSABS 0.9 1.0 1.0 1.2 1.0  MONOABS 0.7 0.7 0.7 0.8 0.7  EOSABS 0.2 0.3 0.3 0.5 0.5  BASOSABS 0.0 0.0 0.1 0.0 0.0    Recent Labs  Lab 12/13/23 0403 12/14/23 0305 12/15/23 0300 12/16/23 0228 12/17/23 0316 12/19/23 0324  NA 127* 132* 129* 129* 128* 126*  K 4.1 4.0 4.3 4.1 3.7 4.1  CL 95* 98 94* 94* 95* 94*  CO2 25 24 25 24 27 23   ANIONGAP 7 10 10 11 6 9   GLUCOSE 98 103* 97 90 97 99  BUN 20 17 20 21 19 23   CREATININE 0.81 0.87 0.86 0.86 0.88 0.81  AST 21 21 22 23 22   --   ALT 17 17 17 18 18   --   ALKPHOS 59 67 67 67 68  --   BILITOT 0.6 0.5 0.7 0.6 0.6  --   ALBUMIN 1.9* 2.0* 2.0* 2.0* 2.0*  --   CRP 2.7* 2.7* 2.2* 2.3*  --   --   PROCALCITON <0.10 <0.10 <0.10 <0.10 <0.10  --   MG 1.7 1.8 1.7 1.7  --   --   CALCIUM 8.4* 8.8* 8.8* 8.8* 8.6* 8.7*      Recent Labs  Lab 12/13/23 0403 12/14/23 0305 12/15/23 0300 12/16/23 0228 12/17/23 0316 12/19/23 0324  CRP 2.7* 2.7* 2.2* 2.3*  --   --   PROCALCITON <0.10 <0.10 <0.10 <0.10 <0.10  --   MG 1.7 1.8 1.7 1.7  --   --   CALCIUM 8.4* 8.8* 8.8* 8.8* 8.6* 8.7*     --------------------------------------------------------------------------------------------------------------- Lab Results  Component Value Date   CHOL 114 12/09/2023   HDL 43 12/09/2023   LDLCALC 59 12/09/2023   TRIG 59 12/09/2023   CHOLHDL 2.7 12/09/2023    No results found for: "HGBA1C" No results for input(s): "TSH", "T4TOTAL", "FREET4", "T3FREE", "THYROIDAB" in the last 72 hours. No results for input(s): "VITAMINB12", "FOLATE", "FERRITIN", "TIBC", "IRON", "RETICCTPCT" in the last 72 hours. ------------------------------------------------------------------------------------------------------------------ Cardiac Enzymes No results for input(s): "CKMB", "TROPONINI", "MYOGLOBIN" in the last 168 hours.  Invalid input(s): "CK"  Micro Results Recent Results (from the past 240 hours)  Aerobic/Anaerobic Culture w Gram Stain (surgical/deep wound)     Status:  None (Preliminary result)   Collection Time: 12/18/23  1:15 PM   Specimen: Joint, Right Hip; Body Fluid  Result Value Ref Range Status   Specimen Description JOINT FLUID RIGHT HIP  Final   Special Requests NONE  Final   Gram Stain   Final    ABUNDANT WBC PRESENT, PREDOMINANTLY PMN NO ORGANISMS SEEN    Culture   Final    NO GROWTH < 24 HOURS Performed at [facility], [address], [place], [place] [address]    Report Status PENDING  Incomplete     Radiology Report CT GUIDED PERITONEAL/RETROPERITONEAL FLUID DRAIN BY PERC CATH Result Date: 12/18/2023 INDICATION: 63 year old male with presumed right hip infection related to prosthetic hip EXAM: CT-GUIDED FLUID ASPIRATION TECHNIQUE: Multidetector CT imaging of the right hip was performed following the standard protocol without IV contrast. RADIATION DOSE REDUCTION: This exam was performed according to the departmental dose-optimization program which includes automated exposure control, adjustment of the mA and/or kV according to patient size and/or use of iterative  reconstruction technique. MEDICATIONS: None ANESTHESIA/SEDATION: Moderate (conscious) sedation was employed during this procedure. A total of Versed 1.5 mg and 0.5 mg IV Dilaudid was administered intravenously by the radiology nurse. Total intra-service moderate Sedation Time: 17 minutes. The patient's level of consciousness and vital signs were monitored continuously by radiology nursing throughout the procedure under my direct supervision. COMPLICATIONS: None PROCEDURE: Informed written consent was obtained from the patient after a thorough discussion of the procedural risks, benefits and alternatives. All questions were addressed. Maximal Sterile Barrier Technique was utilized including caps, mask, sterile gowns, sterile gloves, sterile drape, hand hygiene and skin antiseptic. A timeout was performed prior to the initiation of the procedure. Patient was positioned left decubitus on the CT gantry table. Scout CT acquired for planning purposes. The patient is prepped and draped in the usual sterile fashion. 1% lidocaine was used for local anesthesia. Using CT guidance, 18 gauge trocar needle was advanced from posterior approach targeting the T2 bright fluid just below the femoral component of the hip prostatic. This was evident on prior MR. The needle was advanced to the joint space and confirmed with CT. Approximately 2 cc of bloody complex fluid aspirated. We then advanced 11 gauge Murphy needle through the same poor and through the of into the space. This was confirmed with CT guidance. We were able to then aspirate approximately 10 cc of fluid. Samples were sent for culture. Patient tolerated the procedure well and remained hemodynamically stable throughout. No complications were encountered and no significant blood loss IMPRESSION: Status post CT-guided right hip aspiration for presumed infected joint. Signed, Yvone Neu. Miachel Roux, RPVI [facility]  Radiology Electronically Signed   By: Gilmer Mor D.O.   On: 12/18/2023 14:12      Signature  -   Huey Bienenstock M.D on 12/19/2023 at 4:38 PM   -  To page go to [email or web address]

## 2023-12-19 NOTE — Progress Notes (Signed)
 Daily Progress Note   Patient Name: [name] [name]       Date: 12/19/2023 DOB: 1961-05-28  Age: 63 y.o. MRN#: [id number] Attending Physician: Starleen Arms, MD Primary Care Physician: Patient, No Pcp Per Admit Date: 12/06/2023 Length of Stay: 12 days  Reason for Consultation/Follow-up: Establishing goals of care  HPI/Patient Profile:  63 y.o. 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  He was admitted on 12/06/2023 with MRSA bacteremia, C2-C3 osteomyelitis, right hip prosthetic joint infection, previous suspicion of tricuspid valve endocarditis, and others.    Palliative medicine was consulted for GOC conversations.  Subjective:   Subjective: Chart Reviewed. Updates received. Patient Assessed. Created space and opportunity for patient  and family to explore thoughts and feelings regarding current medical situation.  Today's Discussion: Before seeing the patient at the bedside I spent time reviewing the chart.  Patient overall has been stable, he had his IR procedure yesterday and apparently went well.  I saw the patient at bedside and he was awake, made eye contact as I entered.  He appears to be quite uncomfortable and when asked him how he is doing he stated "rough day."  When inquired for more detail he states that he is quite constipated has not been able to have a bowel movement.  He has already tried MiraLAX and prune juice, which has not been effective in 2 hours.  Again, he did look quite uncomfortable.  I examined him and his belly is soft but is tender.  I told him I would work on getting something to help him have a bowel movement and I asked if he  would be agreeable to an enema if it came to that such as other medications not helping and he said he would.  I asked him how the procedure went yesterday and he said "okay".  I shared that labs are still pending and further decisions to be made based on those.  I shared that I am off next couple days but I would check on him early next week to see how he is doing.  I again assured him I would work on getting something for his constipation.  I provided emotional and general support through therapeutic listening, empathy, sharing of stories, and other techniques. I answered all questions and addressed all concerns to the best of my ability.  After I saw the patient I asked the nurse and she stated that she had requested something from the doctor who gave her verbal order but she was bit confused by it.  I reached out to the hospitalist and clarified intention for mag citrate which she stated that he would put in.  Review of Systems  Respiratory:  Negative for shortness of breath.   Cardiovascular:  Negative for chest pain.  Gastrointestinal:  Positive for abdominal pain and constipation. Negative for nausea and vomiting.    Objective:   Vital Signs:  BP 111/66 (BP Location: Left Arm)   Pulse 76   Temp 97.8 F (36.6 C) (Oral)   Resp 20   Ht 6\' 1"  (1.854 m)   Wt 54.6 kg   SpO2 97%   BMI 15.88 kg/m   Physical Exam Vitals and nursing  note reviewed.  HENT:     Head: Normocephalic and atraumatic.  Cardiovascular:     Rate and Rhythm: Normal rate.  Pulmonary:     Effort: Pulmonary effort is normal. No respiratory distress.  Abdominal:     General: Abdomen is flat. There is no distension.     Palpations: Abdomen is soft.     Tenderness: There is abdominal tenderness.  Skin:    General: Skin is warm and dry.  Neurological:     General: No focal deficit present.     Mental Status: He is alert and oriented to person, place, and time.  Psychiatric:        Mood and Affect: Mood  normal.        Behavior: Behavior normal.     Palliative Assessment/Data: 50-60%    Existing Vynca/ACP Documentation: None  Assessment & Plan:   Impression: Present on Admission:  MRSA bacteremia  63 year old male with acute presentation chronic comorbidities as described above. The patient has a significant infection likely from hip joint infection. He refused attempted IR aspiration. After our discussion today he is agreeable to reattempt IR aspiration, open to discussing surgical options with orthopedic surgery after aspiration. Also seems like he will be agreeable to next day or 2 to reengage with PT/OT. Palliative medicine will continue to follow. Overall prognosis guarded to poor pending participation and the medical plan.   SUMMARY OF RECOMMENDATIONS   Full code Full scope of care Awaiting results and ortho options Open to discussion with orthopedic surgery after results Mag Citrate for constipation per hospitalist Palliative medicine will follow-up early next week  Symptom Management:  Per primary team PMT is available to assist as needed  Code Status: Full code  Prognosis: Unable to determine  Discharge Planning: To Be Determined  Discussed with: Patient, medical team, nursing team  Thank you for allowing [place] to participate in the care of [name] [name] PMT will continue to support holistically.  Time Total: 52 min  Detailed review of medical records (labs, imaging, vital signs), medically appropriate exam, discussed with treatment team, counseling and education to patient, family, & staff, documenting clinical information, medication management, coordination of care  Wynne Dust, NP Palliative Medicine Team  Team Phone # [phone] (Nights/Weekends)  05/16/2021, 8:17 AM

## 2023-12-19 NOTE — Plan of Care (Signed)

## 2023-12-19 NOTE — Plan of Care (Signed)

## 2023-12-20 DIAGNOSIS — B9562 Methicillin resistant Staphylococcus aureus infection as the cause of diseases classified elsewhere: Secondary | ICD-10-CM | POA: Diagnosis not present

## 2023-12-20 DIAGNOSIS — R7881 Bacteremia: Secondary | ICD-10-CM | POA: Diagnosis not present

## 2023-12-20 LAB — BASIC METABOLIC PANEL WITH GFR
Anion gap: 9 (ref 5–15)
BUN: 17 mg/dL (ref 8–23)
CO2: 24 mmol/L (ref 22–32)
Calcium: 8.4 mg/dL — ABNORMAL LOW (ref 8.9–10.3)
Chloride: 98 mmol/L (ref 98–111)
Creatinine, Ser: 0.73 mg/dL (ref 0.61–1.24)
GFR, Estimated: >60 mL/min (ref >60)
Glucose, Bld: 97 mg/dL (ref 70–99)
Potassium: 4.2 mmol/L (ref 3.5–5.1)
Sodium: 131 mmol/L — ABNORMAL LOW (ref 135–145)

## 2023-12-20 LAB — CBC
HCT: 28.3 % — ABNORMAL LOW (ref 39.0–52.0)
Hemoglobin: 9.3 g/dL — ABNORMAL LOW (ref 13.0–17.0)
MCH: 28.4 pg (ref 26.0–34.0)
MCHC: 32.9 g/dL (ref 30.0–36.0)
MCV: 86.5 fL (ref 80.0–100.0)
Platelets: 236 10*3/uL (ref 150–400)
RBC: 3.27 MIL/uL — ABNORMAL LOW (ref 4.22–5.81)
RDW: 16.2 % — ABNORMAL HIGH (ref 11.5–15.5)
WBC: 7.7 10*3/uL (ref 4.0–10.5)
nRBC: 0 % (ref 0.0–0.2)

## 2023-12-20 LAB — CYTOLOGY - NON PAP

## 2023-12-20 NOTE — Progress Notes (Signed)
 PROGRESS NOTE                                                                                                                                                                                                             Patient Demographics:    [name] [name], is a 63 y.o. male, DOB - January 09, 1961, ZOX:[id number]  Outpatient Primary MD for the patient is Patient, No Pcp Per    LOS - 13  Admit date - 12/06/2023    Chief Complaint  Patient presents with   Abdominal Pain       Brief Narrative (HPI from H&P)     63 y.o. 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  -He did not want to go back to [facility], he was also due for a TEE and was admitted to the hospital here for further care.   Subjective:   No chest pain, no shortness of breath, reports lower back pain.  His constipation has resolved yesterday, he does report some abdominal discomfort since.   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 - Antibiotics management per ID, on IV vancomycin - Per  neurosurgeon Dr. 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 - This post right hip abscess aspiration, culture growing MRSA, orthopedic will reassess today -For now continue IV Vancomycin with stop date 01/17/2024 via PICC line, he is homeless, IV drug user, not compliant with appointments and ID feels that prolonged IV antibiotics in the hospital is the safest course for now.  Of note patient was to get  Dalbavancin prior to [facility] but appears that he never received it.    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  No signs of acute infection, C-spine MRI changes discussed with Dr. Conchita Paris, medical treatment.  Supportive care for brain, history of narcotic seeking behavior.   Lactic acidosis  - due to dehydration.  Has been hydrated.   Chronic HFrEF  - LVEF 20 to 25%, cardiology following.  Continue on Aldactone and Coreg, diurese as needed   Possible acute pyelonephritis seen on CT scan, POA  - Treated with IV antibiotics, As needed analgesics, negative urine culture.   History of PE/DVT on Eliquis  - Resume home Eliquis also has IVC filter.   History of polysubstance abuse   -Denies recent use of IV drugs, most recently his urine drug screen was positive on care everywhere at [facility] for amphetamine and cocaine in February 2025.   Tobacco use disorder   - Current smoker, Nicotine patch   Generalized weakness   -PT OT assessment, Fall precautions   Severe protein calorie malnutrition  -- Dietitian consult, protein supplementation  Hyponatremia.  - SIADH, recommended fluid restrictions, required Samsca.   Incidental finding of right inguinal hernia. -  No acute issues.  Monitor  Hep C infection -Management as an outpatient  No IV access.  PICC line placed 12/09/2023 after much  counseling       Condition - Extremely Guarded  Family Communication  :  None present  Code Status :   Full  Consults  :  ID, Cards, Dr. Conchita Paris Neurosurgery, orthopedics, IR, palliative care  PUD Prophylaxis :  Pepcid   Procedures  :     PICC line placed 12/09/2023   CT R Hip - 1. Status post total right hip arthroplasty. 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 2. There is complex joint fluid inferior to the right femoral neck prosthesis with mild-to-moderate joint capsule thickening. This fluid extends into the right obturator externus muscle and into the right iliopsoas bursa. There also appears to be posterior extension of joint fluid into a fluid collection wrapping around the posterior aspect of the right greater trochanter and extending superiorly into the right gluteus medius muscle. This is suspicious for right humerus however septic arthritis with the infected joint fluid extending peripherally. 3. Moderate right gluteus minimus and medius muscle edema. Focal edema within the anterior aspect of the left gluteus medius muscle. 4. There is metallic susceptibility artifact in the region of the posterior right hip/buttock. Recommend clinical correlation for possible surgical skin staples in this region.   TTE - 1. Left ventricular ejection fraction, by estimation, is 20 to 25%. The left ventricle has severely decreased function. The left ventricle demonstrates global hypokinesis. The left ventricular internal cavity size was mildly dilated. Left ventricular diastolic parameters are consistent with Grade I diastolic dysfunction (impaired relaxation). No LV thrombus noted.  2. Right ventricular systolic function is mildly reduced. The right ventricular size is normal. Tricuspid  regurgitation signal is inadequate for assessing PA pressure.  3. The mitral valve is normal in structure. No evidence of mitral valve regurgitation. No evidence of mitral stenosis. Moderate mitral annular calcification.  4. The aortic valve was not  well visualized. Aortic valve regurgitation is not visualized. No aortic stenosis is present.  5. IVC not visualized.  MRI brain.  Nonacute.    MRI C-spine.   Severely motion limited noncontrast study. Significant marrow edema surrounding the left C2-C3 facet joint and extending to involve the posterior left C2 and C3 vertebral bodies with surrounding paraspinal edema, concerning for septic arthritis and possibly discitis/osteomyelitis given the reported clinical concern for infection. A repeat MRI of the cervical spine with contrast is recommended when the patient is able (possibly with sedation) to better evaluate.  MRI L-spine.  1. Chronic compression fractures of T12, L2, L3, and L5 with no complicating features. 2. Subtle and nonspecific bilateral erector spinae muscle edema. A similar appearance can be seen in chronically debilitated patients. 3. 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 4. Capacious lumbar spinal canal with no spinal stenosis  CT scan abdomen pelvis.1. Emphysematous changes and chronic bronchitic changes in the lung bases. Trace bilateral pleural effusions. 2. Heterogeneous nephrograms bilaterally suggesting acute pyelonephritis. No abscess or hydronephrosis. 3. Moderate-sized right inguinal hernia containing bowel without obvious proximal obstruction. Visualization is limited due to streak artifact. 4. Aortic atherosclerosis.  Inferior vena caval filter. 5. Small amount of free fluid in the abdomen of nonspecific etiology      Disposition Plan  :    Status is: Inpatient   DVT Prophylaxis  :     apixaban (ELIQUIS)  tablet 5 mg     Lab Results  Component Value Date   PLT 236 12/20/2023    Diet :  Diet Order             Diet regular Room service appropriate? Yes; Fluid consistency: Thin; Fluid restriction: 1200 mL Fluid  Diet effective now                    Inpatient Medications  Scheduled Meds:  apixaban  5 mg Oral BID   atorvastatin  80 mg Oral QHS   carvedilol  3.125 mg Oral BID WC   Chlorhexidine Gluconate Cloth  6 each Topical Daily   famotidine  20 mg Oral Daily   feeding supplement  237 mL Oral BID BM   multivitamin with minerals  1 tablet Oral Daily   saccharomyces boulardii  250 mg Oral BID   spironolactone  25 mg Oral Daily   Continuous Infusions:  vancomycin 1,000 mg (12/20/23 1157)   PRN Meds:.acetaminophen, HYDROmorphone (DILAUDID) injection, melatonin, naLOXone (NARCAN)  injection, oxyCODONE, polyethylene glycol, prochlorperazine, sodium chloride flush     Objective:   Vitals:   12/20/23 0459 12/20/23 0500 12/20/23 0809 12/20/23 1150  BP: 121/72  110/71 118/80  Pulse: 63  65 65  Resp:   18 18  Temp: 97.9 F (36.6 C)  (!) 97.5 F (36.4 C) 98.1 F (36.7 C)  TempSrc: Oral  Oral Oral  SpO2:   100% 100%  Weight:  56 kg    Height:        Wt Readings from Last 3 Encounters:  12/20/23 56 kg  10/23/18 72.6 kg  09/09/18 70.3 kg     Intake/Output Summary (Last 24 hours) at 12/20/2023 1214 Last data filed at 12/20/2023 1100 Gross per 24 hour  Intake 955.39 ml  Output 1825 ml  Net -869.61 ml      Physical Exam  Awake Alert, Oriented X 3,chronically ill appearing, appears older  than stated age Symmetrical Chest wall movement, Good air movement bilaterally, CTAB RRR,No Gallops,Rubs or new Murmurs, No Parasternal Heave +ve B.Sounds, Abd Soft, No tenderness, No rebound - guarding or rigidity. No Cyanosis, Clubbing or edema, No new Rash or bruise       Data Review:    Recent Labs  Lab 12/14/23 0305 12/15/23 0300 12/16/23 0228 12/17/23 0316  12/20/23 0359  WBC 6.8 7.7 7.8 7.8 7.7  HGB 9.6* 9.6* 9.6* 9.7* 9.3*  HCT 29.5* 30.0* 29.7* 29.4* 28.3*  PLT 327 321 323 289 236  MCV 87.8 88.0 88.7 87.2 86.5  MCH 28.6 28.2 28.7 28.8 28.4  MCHC 32.5 32.0 32.3 33.0 32.9  RDW 15.9* 15.9* 16.1* 16.1* 16.2*  LYMPHSABS 1.0 1.0 1.2 1.0  --   MONOABS 0.7 0.7 0.8 0.7  --   EOSABS 0.3 0.3 0.5 0.5  --   BASOSABS 0.0 0.1 0.0 0.0  --     Recent Labs  Lab 12/14/23 0305 12/15/23 0300 12/16/23 0228 12/17/23 0316 12/19/23 0324 12/20/23 0359  NA 132* 129* 129* 128* 126* 131*  K 4.0 4.3 4.1 3.7 4.1 4.2  CL 98 94* 94* 95* 94* 98  CO2 24 25 24 27 23 24   ANIONGAP 10 10 11 6 9 9   GLUCOSE 103* 97 90 97 99 97  BUN 17 20 21 19 23 17   CREATININE 0.87 0.86 0.86 0.88 0.81 0.73  AST 21 22 23 22   --   --   ALT 17 17 18 18   --   --   ALKPHOS 67 67 67 68  --   --   BILITOT 0.5 0.7 0.6 0.6  --   --   ALBUMIN 2.0* 2.0* 2.0* 2.0*  --   --   CRP 2.7* 2.2* 2.3*  --   --   --   PROCALCITON <0.10 <0.10 <0.10 <0.10  --   --   MG 1.8 1.7 1.7  --   --   --   CALCIUM 8.8* 8.8* 8.8* 8.6* 8.7* 8.4*      Recent Labs  Lab 12/14/23 0305 12/15/23 0300 12/16/23 0228 12/17/23 0316 12/19/23 0324 12/20/23 0359  CRP 2.7* 2.2* 2.3*  --   --   --   PROCALCITON <0.10 <0.10 <0.10 <0.10  --   --   MG 1.8 1.7 1.7  --   --   --   CALCIUM 8.8* 8.8* 8.8* 8.6* 8.7* 8.4*    --------------------------------------------------------------------------------------------------------------- Lab Results  Component Value Date   CHOL 114 12/09/2023   HDL 43 12/09/2023   LDLCALC 59 12/09/2023   TRIG 59 12/09/2023   CHOLHDL 2.7 12/09/2023    No results found for: "HGBA1C" No results for input(s): "TSH", "T4TOTAL", "FREET4", "T3FREE", "THYROIDAB" in the last 72 hours. No results for input(s): "VITAMINB12", "FOLATE", "FERRITIN", "TIBC", "IRON", "RETICCTPCT" in the last 72  hours. ------------------------------------------------------------------------------------------------------------------ Cardiac Enzymes No results for input(s): "CKMB", "TROPONINI", "MYOGLOBIN" in the last 168 hours.  Invalid input(s): "CK"  Micro Results Recent Results (from the past 240 hours)  Aerobic/Anaerobic Culture w Gram Stain (surgical/deep wound)     Status: None (Preliminary result)   Collection Time: 12/18/23  1:15 PM   Specimen: Joint, Right Hip; Body Fluid  Result Value Ref Range Status   Specimen Description JOINT FLUID RIGHT HIP  Final   Special Requests NONE  Final   Gram Stain   Final    ABUNDANT WBC PRESENT, PREDOMINANTLY PMN NO ORGANISMS SEEN    Culture  Final    RARE STAPHYLOCOCCUS AUREUS SUSCEPTIBILITIES TO FOLLOW Performed at [facility], [address] [place], [place] [address]    Report Status PENDING  Incomplete     Radiology Report CT GUIDED PERITONEAL/RETROPERITONEAL FLUID DRAIN BY PERC CATH Result Date: 12/18/2023 INDICATION: 63 year old male with presumed right hip infection related to prosthetic hip EXAM: CT-GUIDED FLUID ASPIRATION TECHNIQUE: Multidetector CT imaging of the right hip was performed following the standard protocol without IV contrast. RADIATION DOSE REDUCTION: This exam was performed according to the departmental dose-optimization program which includes automated exposure control, adjustment of the mA and/or kV according to patient size and/or use of iterative reconstruction technique. MEDICATIONS: None ANESTHESIA/SEDATION: Moderate (conscious) sedation was employed during this procedure. A total of Versed 1.5 mg and 0.5 mg IV Dilaudid was administered intravenously by the radiology nurse. Total intra-service moderate Sedation Time: 17 minutes. The patient's level of consciousness and vital signs were monitored continuously by radiology nursing throughout the procedure under my direct supervision. COMPLICATIONS: None PROCEDURE:  Informed written consent was obtained from the patient after a thorough discussion of the procedural risks, benefits and alternatives. All questions were addressed. Maximal Sterile Barrier Technique was utilized including caps, mask, sterile gowns, sterile gloves, sterile drape, hand hygiene and skin antiseptic. A timeout was performed prior to the initiation of the procedure. Patient was positioned left decubitus on the CT gantry table. Scout CT acquired for planning purposes. The patient is prepped and draped in the usual sterile fashion. 1% lidocaine was used for local anesthesia. Using CT guidance, 18 gauge trocar needle was advanced from posterior approach targeting the T2 bright fluid just below the femoral component of the hip prostatic. This was evident on prior MR. The needle was advanced to the joint space and confirmed with CT. Approximately 2 cc of bloody complex fluid aspirated. We then advanced 11 gauge Murphy needle through the same poor and through the of into the space. This was confirmed with CT guidance. We were able to then aspirate approximately 10 cc of fluid. Samples were sent for culture. Patient tolerated the procedure well and remained hemodynamically stable throughout. No complications were encountered and no significant blood loss IMPRESSION: Status post CT-guided right hip aspiration for presumed infected joint. Signed, Yvone Neu. Miachel Roux, RPVI [facility] Rad[facility] Electronically Signed   By: Gilmer Mor D.O.   On: 12/18/2023 14:12      Signature  -   Huey Bienenstock M.D on 12/20/2023 at 12:14 PM   -  To page go to [email or web address]

## 2023-12-20 NOTE — Plan of Care (Signed)

## 2023-12-20 NOTE — Plan of Care (Signed)
   Problem: Education: Goal: Knowledge of General Education information will improve Description: Including pain rating scale, medication(s)/side effects and non-pharmacologic comfort measures Outcome: Progressing   Problem: Nutrition: Goal: Adequate nutrition will be maintained Outcome: Progressing

## 2023-12-20 NOTE — Progress Notes (Signed)
 Patient ID: [name] [name], male   DOB: 1961-06-30, 63 y.o.   MRN: [id number]  Contacted regarding [name]'s aspiration which showed gout crystals but also unfortunately grew Staph a. His imaging was reviewed by multiple joint replacement surgeons. 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 It's possible he's a candidate for some sort of pelvic reconstruction surgery but it's nothing anyone in this community does. Recommend transfer to tertiary center for further consideration.    Freeman Caldron, PA-C Orthopedic Surgery [phone]

## 2023-12-21 DIAGNOSIS — R7881 Bacteremia: Secondary | ICD-10-CM | POA: Diagnosis not present

## 2023-12-21 DIAGNOSIS — B9562 Methicillin resistant Staphylococcus aureus infection as the cause of diseases classified elsewhere: Secondary | ICD-10-CM | POA: Diagnosis not present

## 2023-12-21 LAB — BASIC METABOLIC PANEL WITH GFR
Anion gap: 9 (ref 5–15)
BUN: 19 mg/dL (ref 8–23)
CO2: 24 mmol/L (ref 22–32)
Calcium: 8.7 mg/dL — ABNORMAL LOW (ref 8.9–10.3)
Chloride: 96 mmol/L — ABNORMAL LOW (ref 98–111)
Creatinine, Ser: 0.86 mg/dL (ref 0.61–1.24)
GFR, Estimated: >60 mL/min (ref >60)
Glucose, Bld: 110 mg/dL — ABNORMAL HIGH (ref 70–99)
Potassium: 4 mmol/L (ref 3.5–5.1)
Sodium: 129 mmol/L — ABNORMAL LOW (ref 135–145)

## 2023-12-21 LAB — CBC
HCT: 27.4 % — ABNORMAL LOW (ref 39.0–52.0)
Hemoglobin: 9 g/dL — ABNORMAL LOW (ref 13.0–17.0)
MCH: 28.6 pg (ref 26.0–34.0)
MCHC: 32.8 g/dL (ref 30.0–36.0)
MCV: 87 fL (ref 80.0–100.0)
Platelets: 264 10*3/uL (ref 150–400)
RBC: 3.15 MIL/uL — ABNORMAL LOW (ref 4.22–5.81)
RDW: 16.7 % — ABNORMAL HIGH (ref 11.5–15.5)
WBC: 8.4 10*3/uL (ref 4.0–10.5)
nRBC: 0 % (ref 0.0–0.2)

## 2023-12-21 NOTE — Progress Notes (Signed)
 PROGRESS NOTE                                                                                                                                                                                                             Patient Demographics:    [name] [name], is a 63 y.o. male, DOB - Feb 20, 1961, WUJ:[id number]  Outpatient Primary MD for the patient is Patient, No Pcp Per    LOS - 14  Admit date - 12/06/2023    Chief Complaint  Patient presents with   Abdominal Pain       Brief Narrative (HPI from H&P)     63 y.o. 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  -He did not want to go back to [facility], he was also due for a TEE and was admitted to the hospital here for further care.   Subjective:   No further constipation.   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 - Antibiotics management per ID, on IV vancomycin - Per neurosurgeon Dr. 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 - This post right hip  abscess aspiration, culture growing MRSA, reevaluated by orthopedic, he will need surgical intervention, but likely pelvic reconstruction surgery, this will need to be done in tertiary center. -For now continue IV Vancomycin with stop date 01/17/2024 via PICC line, he is homeless, IV drug user, not compliant with appointments and ID feels that prolonged IV antibiotics in the hospital is the safest course for now.  Of note patient was to get Dalbavancin prior to  his hospital discharge [facility] but appears that he never received it.    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  No signs of acute infection, C-spine MRI changes discussed with Dr. Conchita Paris, medical treatment.  Supportive care for brain, history of narcotic seeking behavior.   Lactic acidosis  - due to dehydration.  Has been hydrated.   Chronic HFrEF  - LVEF 20 to 25%, cardiology following.  Continue on Aldactone and Coreg, diurese as needed   Possible acute pyelonephritis seen on CT scan, POA  - Treated with IV antibiotics, As needed analgesics, negative urine culture.   History of PE/DVT on Eliquis  - Resume home Eliquis also has IVC filter.   History of polysubstance abuse   -Denies recent use of IV drugs, most recently his urine drug screen was positive on care everywhere at [facility] for amphetamine and cocaine in February 2025.   Tobacco use disorder   - Current smoker, Nicotine patch   Generalized weakness   -PT OT assessment, Fall precautions   Severe protein calorie malnutrition  -- Dietitian consult, protein supplementation  Hyponatremia.  - SIADH, recommended fluid restrictions, required Samsca.   Incidental finding of right inguinal hernia. -  No acute issues.  Monitor  Hep C infection -Management as an outpatient  No IV access.  PICC line placed 12/09/2023 after much counseling        Condition - Extremely Guarded  Family Communication  :  None present  Code Status :   Full  Consults  :  ID, Cards, Dr. Conchita Paris Neurosurgery, orthopedics, IR, palliative care  PUD Prophylaxis :  Pepcid   Procedures  :     PICC line placed 12/09/2023   CT R Hip - 1. Status post total right hip arthroplasty. 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 2. There is complex joint fluid inferior to the right femoral neck prosthesis with mild-to-moderate joint capsule thickening. This fluid extends into the right obturator externus muscle and into the right iliopsoas bursa. There also appears to be posterior extension of joint fluid into a fluid collection wrapping around the posterior aspect of the right greater trochanter and extending superiorly into the right gluteus medius muscle. This is suspicious for right humerus however septic arthritis with the infected joint fluid extending peripherally. 3. Moderate right gluteus minimus and medius muscle edema. Focal edema within the anterior aspect of the left gluteus medius muscle. 4. There is metallic susceptibility artifact in the region of the posterior right hip/buttock. Recommend clinical correlation for possible surgical skin staples in this region.   TTE - 1. Left ventricular ejection fraction, by estimation, is 20 to 25%. The left ventricle has severely decreased function. The left ventricle demonstrates global hypokinesis. The left ventricular internal cavity size was mildly dilated. Left ventricular diastolic parameters are consistent with Grade I diastolic dysfunction (impaired relaxation). No LV thrombus noted.  2. Right ventricular systolic function is mildly reduced. The right ventricular size is normal. Tricuspid regurgitation signal  is inadequate for assessing PA pressure.  3. The mitral valve is normal in structure. No evidence of mitral valve regurgitation. No evidence of mitral stenosis. Moderate mitral annular calcification.  4. The aortic valve was not well visualized. Aortic  valve regurgitation is not visualized. No aortic stenosis is present.  5. IVC not visualized.  MRI brain.  Nonacute.    MRI C-spine.   Severely motion limited noncontrast study. Significant marrow edema surrounding the left C2-C3 facet joint and extending to involve the posterior left C2 and C3 vertebral bodies with surrounding paraspinal edema, concerning for septic arthritis and possibly discitis/osteomyelitis given the reported clinical concern for infection. A repeat MRI of the cervical spine with contrast is recommended when the patient is able (possibly with sedation) to better evaluate.  MRI L-spine.  1. Chronic compression fractures of T12, L2, L3, and L5 with no complicating features. 2. Subtle and nonspecific bilateral erector spinae muscle edema. A similar appearance can be seen in chronically debilitated patients. 3. 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 4. Capacious lumbar spinal canal with no spinal stenosis  CT scan abdomen pelvis.1. Emphysematous changes and chronic bronchitic changes in the lung bases. Trace bilateral pleural effusions. 2. Heterogeneous nephrograms bilaterally suggesting acute pyelonephritis. No abscess or hydronephrosis. 3. Moderate-sized right inguinal hernia containing bowel without obvious proximal obstruction. Visualization is limited due to streak artifact. 4. Aortic atherosclerosis.  Inferior vena caval filter. 5. Small amount of free fluid in the abdomen of nonspecific etiology      Disposition Plan  :    Status is: Inpatient   DVT Prophylaxis  :     apixaban (ELIQUIS) tablet 5 mg     Lab  Results  Component Value Date   PLT 264 12/21/2023    Diet :  Diet Order             Diet regular Room service appropriate? Yes; Fluid consistency: Thin; Fluid restriction: 1200 mL Fluid  Diet effective now                    Inpatient Medications  Scheduled Meds:  apixaban  5 mg Oral BID   atorvastatin  80 mg Oral QHS   carvedilol  3.125 mg Oral BID WC   Chlorhexidine Gluconate Cloth  6 each Topical Daily   famotidine  20 mg Oral Daily   feeding supplement  237 mL Oral BID BM   multivitamin with minerals  1 tablet Oral Daily   saccharomyces boulardii  250 mg Oral BID   spironolactone  25 mg Oral Daily   Continuous Infusions:  vancomycin 1,000 mg (12/21/23 1156)   PRN Meds:.acetaminophen, HYDROmorphone (DILAUDID) injection, melatonin, naLOXone (NARCAN)  injection, oxyCODONE, polyethylene glycol, prochlorperazine, sodium chloride flush     Objective:   Vitals:   12/20/23 2143 12/21/23 0418 12/21/23 0737 12/21/23 1201  BP:  104/72 132/74 115/77  Pulse: 72 81 81 82  Resp:  18 20 18   Temp:  (!) 97.4 F (36.3 C) 97.9 F (36.6 C) 98.2 F (36.8 C)  TempSrc:  Oral Oral Oral  SpO2: 100% 97% 98% 99%  Weight:      Height:        Wt Readings from Last 3 Encounters:  12/20/23 57.3 kg  10/23/18 72.6 kg  09/09/18 70.3 kg     Intake/Output Summary (Last 24 hours) at 12/21/2023 1310 Last data filed at 12/21/2023 0739 Gross per 24 hour  Intake 480 ml  Output 975 ml  Net -495 ml      Physical Exam  Awake Alert, Oriented X 3, cachectic chronically ill-appearing Symmetrical Chest wall movement, Remedies  with no cyanosis      Data Review:    Recent Labs  Lab 12/15/23 0300 12/16/23 0228 12/17/23 0316 12/20/23 0359 12/21/23 0316  WBC 7.7 7.8 7.8 7.7 8.4  HGB 9.6* 9.6* 9.7* 9.3* 9.0*  HCT 30.0* 29.7* 29.4* 28.3* 27.4*  PLT 321 323 289 236 264  MCV 88.0 88.7 87.2 86.5 87.0  MCH 28.2 28.7 28.8 28.4 28.6  MCHC 32.0 32.3 33.0 32.9 32.8  RDW 15.9* 16.1*  16.1* 16.2* 16.7*  LYMPHSABS 1.0 1.2 1.0  --   --   MONOABS 0.7 0.8 0.7  --   --   EOSABS 0.3 0.5 0.5  --   --   BASOSABS 0.1 0.0 0.0  --   --     Recent Labs  Lab 12/15/23 0300 12/16/23 0228 12/17/23 0316 12/19/23 0324 12/20/23 0359 12/21/23 0316  NA 129* 129* 128* 126* 131* 129*  K 4.3 4.1 3.7 4.1 4.2 4.0  CL 94* 94* 95* 94* 98 96*  CO2 25 24 27 23 24 24   ANIONGAP 10 11 6 9 9 9   GLUCOSE 97 90 97 99 97 110*  BUN 20 21 19 23 17 19   CREATININE 0.86 0.86 0.88 0.81 0.73 0.86  AST 22 23 22   --   --   --   ALT 17 18 18   --   --   --   ALKPHOS 67 67 68  --   --   --   BILITOT 0.7 0.6 0.6  --   --   --   ALBUMIN 2.0* 2.0* 2.0*  --   --   --   CRP 2.2* 2.3*  --   --   --   --   PROCALCITON <0.10 <0.10 <0.10  --   --   --   MG 1.7 1.7  --   --   --   --   CALCIUM 8.8* 8.8* 8.6* 8.7* 8.4* 8.7*      Recent Labs  Lab 12/15/23 0300 12/16/23 0228 12/17/23 0316 12/19/23 0324 12/20/23 0359 12/21/23 0316  CRP 2.2* 2.3*  --   --   --   --   PROCALCITON <0.10 <0.10 <0.10  --   --   --   MG 1.7 1.7  --   --   --   --   CALCIUM 8.8* 8.8* 8.6* 8.7* 8.4* 8.7*    --------------------------------------------------------------------------------------------------------------- Lab Results  Component Value Date   CHOL 114 12/09/2023   HDL 43 12/09/2023   LDLCALC 59 12/09/2023   TRIG 59 12/09/2023   CHOLHDL 2.7 12/09/2023    No results found for: "HGBA1C" No results for input(s): "TSH", "T4TOTAL", "FREET4", "T3FREE", "THYROIDAB" in the last 72 hours. No results for input(s): "VITAMINB12", "FOLATE", "FERRITIN", "TIBC", "IRON", "RETICCTPCT" in the last 72 hours. ------------------------------------------------------------------------------------------------------------------ Cardiac Enzymes No results for input(s): "CKMB", "TROPONINI", "MYOGLOBIN" in the last 168 hours.  Invalid input(s): "CK"  Micro Results Recent Results (from the past 240 hours)  Aerobic/Anaerobic Culture w  Gram Stain (surgical/deep wound)     Status: None (Preliminary result)   Collection Time: 12/18/23  1:15 PM   Specimen: Joint, Right Hip; Body Fluid  Result Value Ref Range Status   Specimen Description JOINT FLUID RIGHT HIP  Final   Special Requests NONE  Final   Gram Stain   Final    ABUNDANT WBC PRESENT, PREDOMINANTLY PMN NO ORGANISMS SEEN Performed at [facility], [address], [place], [place] [address]  Culture   Final    RARE METHICILLIN RESISTANT STAPHYLOCOCCUS AUREUS NO ANAEROBES ISOLATED; CULTURE IN PROGRESS FOR 5 DAYS    Report Status PENDING  Incomplete   Organism ID, Bacteria METHICILLIN RESISTANT STAPHYLOCOCCUS AUREUS  Final      Susceptibility   Methicillin resistant staphylococcus aureus - MIC*    CIPROFLOXACIN <=0.5 SENSITIVE Sensitive     ERYTHROMYCIN >=8 RESISTANT Resistant     GENTAMICIN <=0.5 SENSITIVE Sensitive     OXACILLIN >=4 RESISTANT Resistant     TETRACYCLINE >=16 RESISTANT Resistant     VANCOMYCIN 1 SENSITIVE Sensitive     TRIMETH/SULFA <=10 SENSITIVE Sensitive     CLINDAMYCIN >=8 RESISTANT Resistant     RIFAMPIN <=0.5 SENSITIVE Sensitive     Inducible Clindamycin NEGATIVE Sensitive     LINEZOLID 2 SENSITIVE Sensitive     * RARE METHICILLIN RESISTANT STAPHYLOCOCCUS AUREUS     Radiology Report No results found.     Signature  -   Huey Bienenstock M.D on 12/21/2023 at 1:10 PM   -  To page go to [email or web address]

## 2023-12-21 NOTE — Progress Notes (Signed)
 Pharmacy Antibiotic Note  [name] [name] is a 63 y.o. male admitted on 12/06/2023 with back pain, possible osteomyelitis.  Admitted 3/8-3/21 at [facility] for MRSA bacteremia and possible endocarditis/osteomyelitis --received Vancomycin throughout admission.  Plan was for discharge 3/21 and administration of Dalvancin weekly, but dose not given.  Pharmacy has been consulted for Vancomycin  dosing.   Per documentation last levels at [facility] on 3/19 on 750mg  q24h with trough of 283 (trough: 7.2 and peak 16.2), adjusted regimen to 1250mg  q24h for eAUC ~480. Scr around 0.8 during this time.   4/1 Vancomycin 1,250 mg IV Q24H- dose administered 1325 (infusion time 1.5h)  4/1 Vanc peak 39- drawn 16:39 (appropriate timing)  4/1 Vanc trough 14- 4/2 11:26 (appropriate timing)  AUC calculated based on levels: ~900   Patient previously on a 1g Q24H. Now with more stable renal function.   Plan: Continue vancomycin 1 g IV Q24H  Recheck levels at [facility] (around 4/7 dose) Planning to continue Vancomycin therapy while inpt through 5/2   Height: 6\' 1"  (185.4 cm) Weight: 57.3 kg (126 lb 5.2 oz) IBW/kg (Calculated) : 79.9  Temp (24hrs), Avg:97.9 F (36.6 C), Min:97.4 F (36.3 C), Max:98.2 F (36.8 C)  Recent Labs  Lab 12/15/23 0300 12/16/23 0228 12/17/23 0316 12/17/23 1639 12/18/23 1126 12/19/23 0324 12/20/23 0359 12/21/23 0316  WBC 7.7 7.8 7.8  --   --   --  7.7 8.4  CREATININE 0.86 0.86 0.88  --   --  0.81 0.73 0.86  VANCOTROUGH  --   --   --   --  14*  --   --   --   VANCOPEAK  --   --   --  39  --   --   --   --     Estimated Creatinine Clearance: 71.3 mL/min (by C-G formula based on SCr of 0.86 mg/dL).    Allergies  Allergen Reactions   Fentanyl Itching   Hydromorphone Hcl Itching    Pt  States itching all over Pt  States itching all over Pt  States itching all over    Morphine Itching   Pantoprazole Hives   Pantoprazole Sodium Hives   Haloperidol Other (See  Comments)    Pt reports he has "seizures" had haldol on 09/25/21 with no apparent ill effect.  Pt reports he has "seizures"   Nsaids Nausea And Vomiting, Other (See Comments) and Hives    Severe cramping Other reaction(s): Abdominal Pain Cramping per [place]   Morphine And Codeine Itching   Ibuprofen    Ketorolac Other (See Comments)   Pantoprazole Sodium Hives   [name] [name], PharmD PGY1 Pharmacy Resident 12/21/2023 2:16 PM

## 2023-12-21 NOTE — Plan of Care (Signed)
°  Problem: Education: Goal: Knowledge of General Education information will improve Description: Including pain rating scale, medication(s)/side effects and non-pharmacologic comfort measures Outcome: Not Progressing   Problem: Health Behavior/Discharge Planning: Goal: Ability to manage health-related needs will improve Outcome: Not Progressing   Problem: Clinical Measurements: Goal: Ability to maintain clinical measurements within normal limits will improve Outcome: Not Progressing Goal: Will remain free from infection Outcome: Not Progressing Goal: Diagnostic test results will improve Outcome: Not Progressing Goal: Respiratory complications will improve Outcome: Not Progressing Goal: Cardiovascular complication will be avoided Outcome: Not Progressing   Problem: Nutrition: Goal: Adequate nutrition will be maintained Outcome: Not Progressing   Problem: Coping: Goal: Level of anxiety will decrease Outcome: Not Progressing   Problem: Safety: Goal: Ability to remain free from injury will improve Outcome: Not Progressing

## 2023-12-21 NOTE — Plan of Care (Signed)
  Problem: Education: Goal: Knowledge of General Education information will improve Description: Including pain rating scale, medication(s)/side effects and non-pharmacologic comfort measures Outcome: Progressing   Problem: Clinical Measurements: Goal: Ability to maintain clinical measurements within normal limits will improve Outcome: Progressing Goal: Diagnostic test results will improve Outcome: Progressing Goal: Respiratory complications will improve Outcome: Progressing Goal: Cardiovascular complication will be avoided Outcome: Progressing   Problem: Activity: Goal: Risk for activity intolerance will decrease Outcome: Progressing   Problem: Nutrition: Goal: Adequate nutrition will be maintained Outcome: Progressing   Problem: Elimination: Goal: Will not experience complications related to bowel motility Outcome: Progressing Goal: Will not experience complications related to urinary retention Outcome: Progressing   Problem: Pain Managment: Goal: General experience of comfort will improve and/or be controlled Outcome: Progressing   Problem: Safety: Goal: Ability to remain free from injury will improve Outcome: Progressing   Problem: Skin Integrity: Goal: Risk for impaired skin integrity will decrease Outcome: Progressing

## 2023-12-22 DIAGNOSIS — R7881 Bacteremia: Secondary | ICD-10-CM | POA: Diagnosis not present

## 2023-12-22 DIAGNOSIS — B9562 Methicillin resistant Staphylococcus aureus infection as the cause of diseases classified elsewhere: Secondary | ICD-10-CM | POA: Diagnosis not present

## 2023-12-22 MED ORDER — DIPHENHYDRAMINE HCL 25 MG PO CAPS
25.0000 mg | ORAL_CAPSULE | Freq: Four times a day (QID) | ORAL | Status: DC | PRN
  Administered 2023-12-22 – 2024-01-01 (×7): 25 mg via ORAL
  Filled 2023-12-22 (×8): qty 1

## 2023-12-22 MED ORDER — ENSURE ENLIVE PO LIQD
237.0000 mL | Freq: Three times a day (TID) | ORAL | Status: DC
  Administered 2023-12-22 – 2024-01-04 (×31): 237 mL via ORAL

## 2023-12-22 MED ORDER — CAMPHOR-MENTHOL 0.5-0.5 % EX LOTN
1.0000 | TOPICAL_LOTION | CUTANEOUS | Status: DC | PRN
  Administered 2023-12-22 – 2024-01-03 (×2): 1 via TOPICAL
  Filled 2023-12-22 (×2): qty 222

## 2023-12-22 NOTE — Progress Notes (Signed)
 PROGRESS NOTE                                                                                                                                                                                                             Patient Demographics:    [name] [name], is a 63 y.o. male, DOB - Mar 07, 1961, ZOX:[id number]  Outpatient Primary MD for the patient is Patient, No Pcp Per    LOS - 15  Admit date - 12/06/2023    Chief Complaint  Patient presents with   Abdominal Pain       Brief Narrative (HPI from H&P)      63 y.o. 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  -He did not want to go back to [facility], he was also due for a TEE and was admitted to the hospital here for further care.   Subjective:   No significant events overnight, patient reports overall he started to feel better   Assessment  & Plan :    MRSA bacteremia, C2-C3 osteomyelitis  right prosthetic hip joint infection previous suspicion of tricuspid valve endocarditis at [facility] IV drug use, history of noncompliance Noncompliance - TTE ([facility]) revealed LVEF 20-25%, tricuspid valve appears thickened with possible mobile component, TTE here does not show any clear evidence of endocarditis, cardiology consulted and following the patient, he was deemed too high risk for TEE as he has underlying CAD with possible severe left main disease and a EF of 20% along with esophageal stricture. - Antibiotics management per ID, on IV vancomycin - Per neurosurgeon Dr. Conchita Paris C2-C3 osteomyelitis requires medical treatment  only, orthopedics following him. - This post right hip abscess aspiration, culture growing MRSA, reevaluated by orthopedic, he will need surgical intervention, but likely pelvic reconstruction surgery, this will need to be done in tertiary center.  I have discussed with the patient, I have discussed with the patient, like to continue current management for now and think about surgery. -For now continue IV Vancomycin with stop date 01/17/2024 via PICC line, he is  homeless, IV drug user, not compliant with appointments and ID feels that prolonged IV antibiotics in the hospital is the safest course for now.  Of note patient was to get Dalbavancin prior to [facility] discharge [facility] but appears that he never received it.    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  No signs of acute infection, C-spine MRI changes discussed with Dr. Conchita Paris, medical treatment.  Supportive care for brain, history of narcotic seeking behavior.   Lactic acidosis  - due to dehydration.  Has been hydrated.   Chronic HFrEF  - LVEF 20 to 25%, cardiology following.  Continue on Aldactone and Coreg, diurese as needed   Possible acute pyelonephritis seen on CT scan, POA  - Treated with IV antibiotics, As needed analgesics, negative urine culture.   History of PE/DVT on Eliquis  - Resume home Eliquis also has IVC filter.   History of polysubstance abuse   -Denies recent use of IV drugs, most recently his urine drug screen was positive on care everywhere at [facility] for amphetamine and cocaine in February 2025.   Tobacco use disorder   - Current smoker, Nicotine patch   Generalized weakness   -PT OT assessment, Fall precautions   Severe protein calorie malnutrition  -- Dietitian consult, protein supplementation  Hyponatremia.  - SIADH, recommended fluid restrictions, required Samsca.    Incidental finding of right inguinal hernia. -  No acute issues.  Monitor  Hep C infection -Management as an outpatient  No IV access.  PICC line placed 12/09/2023 after much counseling       Condition - Extremely Guarded  Family Communication  :  None present  Code Status :   Full  Consults  :  ID, Cards, Dr. Conchita Paris Neurosurgery, orthopedics, IR, palliative care  PUD Prophylaxis :  Pepcid   Procedures  :     PICC line placed 12/09/2023   CT R Hip - 1. Status post total right hip arthroplasty. 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 2. There is complex joint fluid inferior to the right femoral neck prosthesis with mild-to-moderate joint capsule thickening. This fluid extends into the right obturator externus muscle and into the right iliopsoas bursa. There also appears to be posterior extension of joint fluid into a fluid collection wrapping around the posterior aspect of the right greater trochanter and extending superiorly into the right gluteus medius muscle. This is suspicious for right humerus however septic arthritis with the infected joint fluid extending peripherally. 3. Moderate right gluteus minimus and medius muscle edema. Focal edema within the anterior aspect of the left gluteus medius muscle. 4. There is metallic susceptibility artifact in the region of the posterior right hip/buttock. Recommend clinical correlation for possible surgical skin staples in this region.   TTE - 1. Left ventricular ejection fraction, by estimation, is 20 to 25%. The left ventricle has severely decreased function. The left ventricle demonstrates global hypokinesis. The left ventricular internal cavity size was mildly dilated. Left ventricular diastolic parameters are consistent with  Grade I diastolic dysfunction (impaired relaxation). No LV thrombus noted.  2. Right ventricular systolic function is mildly reduced. The right ventricular size is normal. Tricuspid regurgitation signal is inadequate for assessing PA pressure.  3. The mitral valve is normal in structure. No evidence of mitral valve regurgitation. No evidence of mitral stenosis. Moderate mitral annular calcification.  4. The aortic valve was not well visualized. Aortic valve regurgitation is not visualized. No aortic stenosis is present.  5. IVC not visualized.  MRI brain.  Nonacute.    MRI C-spine.   Severely motion limited noncontrast study. Significant marrow edema surrounding the left C2-C3 facet joint and extending to involve the posterior left C2 and C3 vertebral bodies with surrounding paraspinal edema, concerning for septic arthritis and possibly discitis/osteomyelitis given the reported clinical concern for infection. A repeat MRI of the cervical spine with contrast is recommended when the patient is able (possibly with sedation) to better evaluate.  MRI L-spine.  1. Chronic compression fractures of T12, L2, L3, and L5 with no complicating features. 2. Subtle and nonspecific bilateral erector spinae muscle edema. A similar appearance can be seen in chronically debilitated patients. 3. 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 4. Capacious lumbar spinal canal with no spinal stenosis  CT scan abdomen pelvis.1. Emphysematous changes and chronic bronchitic changes in the lung bases. Trace bilateral pleural effusions. 2. Heterogeneous nephrograms bilaterally suggesting acute pyelonephritis. No abscess or hydronephrosis. 3. Moderate-sized right inguinal hernia containing bowel without obvious proximal obstruction. Visualization is limited due to streak artifact. 4. Aortic atherosclerosis.  Inferior vena caval  filter. 5. Small amount of free fluid in the abdomen of nonspecific etiology      Disposition Plan  :    Status is: Inpatient   DVT Prophylaxis  :     apixaban (ELIQUIS) tablet 5 mg     Lab Results  Component Value Date   PLT 264 12/21/2023    Diet :  Diet Order             Diet regular Room service appropriate? Yes; Fluid consistency: Thin; Fluid restriction: 1200 mL Fluid  Diet effective now                    Inpatient Medications  Scheduled Meds:  apixaban  5 mg Oral BID   atorvastatin  80 mg Oral QHS   carvedilol  3.125 mg Oral BID WC   Chlorhexidine Gluconate Cloth  6 each Topical Daily   famotidine  20 mg Oral Daily   feeding supplement  237 mL Oral BID BM   multivitamin with minerals  1 tablet Oral Daily   saccharomyces boulardii  250 mg Oral BID   spironolactone  25 mg Oral Daily   Continuous Infusions:  vancomycin 1,000 mg (12/21/23 1156)   PRN Meds:.acetaminophen, camphor-menthol, HYDROmorphone (DILAUDID) injection, melatonin, naLOXone (NARCAN)  injection, oxyCODONE, polyethylene glycol, prochlorperazine, sodium chloride flush     Objective:   Vitals:   12/22/23 0432 12/22/23 0449 12/22/23 0821 12/22/23 1157  BP: 120/89  124/79 (!) 113/57  Pulse: 85  90 74  Resp: 18  18 18   Temp: 98.9 F (37.2 C)  98.7 F (37.1 C) 98.7 F (37.1 C)  TempSrc: Oral  Oral Oral  SpO2: 99%  (!) 88% 100%  Weight:  56.2 kg    Height:        Wt Readings from Last 3 Encounters:  12/22/23 56.2 kg  10/23/18 72.6 kg  09/09/18 70.3 kg     Intake/Output Summary (Last 24 hours) at 12/22/2023 1220 Last data filed at 12/22/2023 0423 Gross  per 24 hour  Intake 240 ml  Output 800 ml  Net -560 ml      Physical Exam  , Alert, no apparent distress, cachectic, chronic ill-appearing Good air entry bilaterally Abdomen soft Extremities with no edema      Data Review:    Recent Labs  Lab 12/16/23 0228 12/17/23 0316 12/20/23 0359 12/21/23 0316  WBC  7.8 7.8 7.7 8.4  HGB 9.6* 9.7* 9.3* 9.0*  HCT 29.7* 29.4* 28.3* 27.4*  PLT 323 289 236 264  MCV 88.7 87.2 86.5 87.0  MCH 28.7 28.8 28.4 28.6  MCHC 32.3 33.0 32.9 32.8  RDW 16.1* 16.1* 16.2* 16.7*  LYMPHSABS 1.2 1.0  --   --   MONOABS 0.8 0.7  --   --   EOSABS 0.5 0.5  --   --   BASOSABS 0.0 0.0  --   --     Recent Labs  Lab 12/16/23 0228 12/17/23 0316 12/19/23 0324 12/20/23 0359 12/21/23 0316  NA 129* 128* 126* 131* 129*  K 4.1 3.7 4.1 4.2 4.0  CL 94* 95* 94* 98 96*  CO2 24 27 23 24 24   ANIONGAP 11 6 9 9 9   GLUCOSE 90 97 99 97 110*  BUN 21 19 23 17 19   CREATININE 0.86 0.88 0.81 0.73 0.86  AST 23 22  --   --   --   ALT 18 18  --   --   --   ALKPHOS 67 68  --   --   --   BILITOT 0.6 0.6  --   --   --   ALBUMIN 2.0* 2.0*  --   --   --   CRP 2.3*  --   --   --   --   PROCALCITON <0.10 <0.10  --   --   --   MG 1.7  --   --   --   --   CALCIUM 8.8* 8.6* 8.7* 8.4* 8.7*      Recent Labs  Lab 12/16/23 0228 12/17/23 0316 12/19/23 0324 12/20/23 0359 12/21/23 0316  CRP 2.3*  --   --   --   --   PROCALCITON <0.10 <0.10  --   --   --   MG 1.7  --   --   --   --   CALCIUM 8.8* 8.6* 8.7* 8.4* 8.7*    --------------------------------------------------------------------------------------------------------------- Lab Results  Component Value Date   CHOL 114 12/09/2023   HDL 43 12/09/2023   LDLCALC 59 12/09/2023   TRIG 59 12/09/2023   CHOLHDL 2.7 12/09/2023    No results found for: "HGBA1C" No results for input(s): "TSH", "T4TOTAL", "FREET4", "T3FREE", "THYROIDAB" in the last 72 hours. No results for input(s): "VITAMINB12", "FOLATE", "FERRITIN", "TIBC", "IRON", "RETICCTPCT" in the last 72 hours. ------------------------------------------------------------------------------------------------------------------ Cardiac Enzymes No results for input(s): "CKMB", "TROPONINI", "MYOGLOBIN" in the last 168 hours.  Invalid input(s): "CK"  Micro Results Recent Results (from  the past 240 hours)  Aerobic/Anaerobic Culture w Gram Stain (surgical/deep wound)     Status: None (Preliminary result)   Collection Time: 12/18/23  1:15 PM   Specimen: Joint, Right Hip; Body Fluid  Result Value Ref Range Status   Specimen Description JOINT FLUID RIGHT HIP  Final   Special Requests NONE  Final   Gram Stain   Final    ABUNDANT WBC PRESENT, PREDOMINANTLY PMN NO ORGANISMS SEEN Performed at [facility], [address], [place], [place] [address]  Culture   Final    RARE METHICILLIN RESISTANT STAPHYLOCOCCUS AUREUS NO ANAEROBES ISOLATED; CULTURE IN PROGRESS FOR 5 DAYS    Report Status PENDING  Incomplete   Organism ID, Bacteria METHICILLIN RESISTANT STAPHYLOCOCCUS AUREUS  Final      Susceptibility   Methicillin resistant staphylococcus aureus - MIC*    CIPROFLOXACIN <=0.5 SENSITIVE Sensitive     ERYTHROMYCIN >=8 RESISTANT Resistant     GENTAMICIN <=0.5 SENSITIVE Sensitive     OXACILLIN >=4 RESISTANT Resistant     TETRACYCLINE >=16 RESISTANT Resistant     VANCOMYCIN 1 SENSITIVE Sensitive     TRIMETH/SULFA <=10 SENSITIVE Sensitive     CLINDAMYCIN >=8 RESISTANT Resistant     RIFAMPIN <=0.5 SENSITIVE Sensitive     Inducible Clindamycin NEGATIVE Sensitive     LINEZOLID 2 SENSITIVE Sensitive     * RARE METHICILLIN RESISTANT STAPHYLOCOCCUS AUREUS     Radiology Report No results found.     Signature  -   Huey Bienenstock M.D on 12/22/2023 at 12:20 PM   -  To page go to [email or web address]

## 2023-12-22 NOTE — Plan of Care (Signed)

## 2023-12-23 DIAGNOSIS — S32000A Wedge compression fracture of unspecified lumbar vertebra, initial encounter for closed fracture: Secondary | ICD-10-CM | POA: Diagnosis not present

## 2023-12-23 DIAGNOSIS — Z515 Encounter for palliative care: Secondary | ICD-10-CM | POA: Diagnosis not present

## 2023-12-23 DIAGNOSIS — R7881 Bacteremia: Secondary | ICD-10-CM | POA: Diagnosis not present

## 2023-12-23 DIAGNOSIS — B9562 Methicillin resistant Staphylococcus aureus infection as the cause of diseases classified elsewhere: Secondary | ICD-10-CM | POA: Diagnosis not present

## 2023-12-23 DIAGNOSIS — Z7189 Other specified counseling: Secondary | ICD-10-CM | POA: Diagnosis not present

## 2023-12-23 LAB — BASIC METABOLIC PANEL WITH GFR
Anion gap: 9 (ref 5–15)
BUN: 25 mg/dL — ABNORMAL HIGH (ref 8–23)
CO2: 26 mmol/L (ref 22–32)
Calcium: 8.8 mg/dL — ABNORMAL LOW (ref 8.9–10.3)
Chloride: 93 mmol/L — ABNORMAL LOW (ref 98–111)
Creatinine, Ser: 0.91 mg/dL (ref 0.61–1.24)
GFR, Estimated: >60 mL/min (ref >60)
Glucose, Bld: 93 mg/dL (ref 70–99)
Potassium: 4.5 mmol/L (ref 3.5–5.1)
Sodium: 128 mmol/L — ABNORMAL LOW (ref 135–145)

## 2023-12-23 LAB — AEROBIC/ANAEROBIC CULTURE W GRAM STAIN (SURGICAL/DEEP WOUND)

## 2023-12-23 LAB — VANCOMYCIN, PEAK: Vancomycin Pk: 37 ug/mL (ref 30–40)

## 2023-12-23 MED ORDER — LACTULOSE 10 GM/15ML PO SOLN
30.0000 g | Freq: Three times a day (TID) | ORAL | Status: AC
  Administered 2023-12-23: 30 g via ORAL
  Filled 2023-12-23 (×2): qty 60

## 2023-12-23 MED ORDER — DOCUSATE SODIUM 100 MG PO CAPS
100.0000 mg | ORAL_CAPSULE | Freq: Two times a day (BID) | ORAL | Status: DC
  Administered 2023-12-23 – 2023-12-26 (×4): 100 mg via ORAL
  Filled 2023-12-23 (×6): qty 1

## 2023-12-23 NOTE — Progress Notes (Signed)
 Daily Progress Note   Patient Name: [name] [name]       Date: 12/23/2023 DOB: 1961-08-05  Age: 63 y.o. MRN#: [id number] Attending Physician: Starleen Arms, MD Primary Care Physician: Patient, No Pcp Per Admit Date: 12/06/2023 Length of Stay: 16 days  Reason for Consultation/Follow-up: Establishing goals of care  HPI/Patient Profile:  63 y.o. 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  He was admitted on 12/06/2023 with MRSA bacteremia, C2-C3 osteomyelitis, right hip prosthetic joint infection, previous suspicion of tricuspid valve endocarditis, and others.    Palliative medicine was consulted for GOC conversations.  Subjective:   Subjective: Chart Reviewed. Updates received. Patient Assessed. Created space and opportunity for patient  and family to explore thoughts and feelings regarding current medical situation.  Today's Discussion: Before seeing the patient at the bedside I spent time reviewing the chart.  Patient overall has been stable, culture from IR aspiration found MRSA.  Orthopedics reevaluated and indicated surgery would likely be extensive and need pelvic reconstruction which is not offered locally and would have to be offered at a tertiary center.  I saw the patient at bedside and he was asleep, but easily awakened to the noise of me entering the room.  He states he is doing okay today.  We reviewed the IR aspiration culture results and recommendations for surgery likely being pelvic reconstruction.  I indicated that in the previous notes I saw that he wanted to continue with antibiotics and have some time to think about surgery.  I asked if he  had any thoughts as of yet or if he had any questions I can help answer or sort through the information.  He stated no he just needed more time to think.  I shared that I would follow-up in a couple days to see how he is doing and if he has made any decisions.  He agreed.  I provided emotional and general support through therapeutic listening, empathy, sharing of stories, and other techniques. I answered all questions and addressed all concerns to the best of my ability.  Review of Systems  Respiratory:  Negative for shortness of breath.   Cardiovascular:  Negative for chest pain.  Gastrointestinal:  Negative for abdominal pain, nausea and vomiting.    Objective:   Vital Signs:  BP 116/77 (BP Location: Left Arm)   Pulse 80   Temp 99.3 F (37.4 C) (Oral)   Resp 18   Ht 6\' 1"  (1.854 m)   Wt 55.5 kg   SpO2 97%   BMI 16.14 kg/m   Physical Exam Vitals and nursing note reviewed.  HENT:     Head: Normocephalic and atraumatic.  Cardiovascular:     Rate and Rhythm: Normal rate.  Pulmonary:     Effort: Pulmonary effort is normal. No respiratory distress.  Abdominal:     General: Abdomen is flat. There is no distension.     Palpations: Abdomen is soft.  Skin:    General: Skin is warm and dry.  Neurological:     General: No focal deficit present.     Mental Status: He is alert and oriented to person, place, and time.  Psychiatric:  Mood and Affect: Mood normal.        Behavior: Behavior normal.     Palliative Assessment/Data: 50-60%    Existing Vynca/ACP Documentation: None  Assessment & Plan:   Impression: Present on Admission:  MRSA bacteremia  63 year old male with acute presentation chronic comorbidities as described above. The patient has a significant infection likely from hip joint infection. He refused attempted IR aspiration. After our discussion today he was agreeable to reattempt IR aspiration which found MRSA abscess.  Orthopedics seems to think he  will need more extensive pelvic reconstruction for surgical repair which would need to be completed at a tertiary care center.  The patient is thinking on his options and has not made a decision yet.  Overall prognosis guarded to poor pending participation and the medical plan.   SUMMARY OF RECOMMENDATIONS   Full code Full scope of care Time for the patient to think about his options and make a decision on surgery Palliative medicine will follow-up in a couple days Please notify us of any significant clinical change or new palliative needs in the interim  Symptom Management:  Per primary team PMT is available to assist as needed  Code Status: Full code  Prognosis: Unable to determine  Discharge Planning: To Be Determined  Discussed with: Patient, medical team, nursing team  Thank you for allowing [place] to participate in the care of [name] [name] PMT will continue to support holistically.  Time Total: 41 min  Detailed review of medical records (labs, imaging, vital signs), medically appropriate exam, discussed with treatment team, counseling and education to patient, family, & staff, documenting clinical information, medication management, coordination of care  Wynne Dust, NP Palliative Medicine Team  Team Phone # [phone] (Nights/Weekends)  05/16/2021, 8:17 AM

## 2023-12-23 NOTE — TOC Progression Note (Signed)
 Transition of Care [facility]) - Progression Note    Patient Details  Name: [name] [name] MRN: [id number] Date of Birth: [birth year]  [facility] CM/SW Contact  Mearl Latin, LCSW Phone Number: 12/23/2023, 3:35 PM  Clinical Narrative:    TOC continuing to follow.     Barriers to Discharge: Continued Medical Work up, Inadequate or no insurance, SNF Pending bed offer, Active Substance Use - Placement  Expected Discharge Plan and Services In-house Referral: Clinical Social Work   Post Acute Care Choice: Skilled Nursing Facility Living arrangements for the past 2 months: Homeless, Hotel/Motel                                       Social Determinants of Health (SDOH) Interventions SDOH Screenings   Food Insecurity: Medium Risk (11/24/2023)   Received from [facility]  Housing: High Risk (11/24/2023)   Received from [facility]  Transportation Needs: Unmet Transportation Needs (11/24/2023)   Received from [facility]  Utilities: Unknown (11/24/2023)   Received from [facility]  Recent Concern: Utilities - High Risk (11/04/2023)   Received from [facility]  Financial Resource Strain: Low Risk  (06/22/2022)   Received from [facility]  Social Connections: Unknown (01/26/2022)   Received from [facility]  Stress: No Stress Concern Present (07/01/2023)   Received from [facility]  Tobacco Use: High Risk (12/06/2023)    Readmission Risk Interventions     No data to display

## 2023-12-23 NOTE — Progress Notes (Signed)
 PROGRESS NOTE                                                                                                                                                                                                             Patient Demographics:    [name] [name], is a 63 y.o. male, DOB - 05/05/1961, YQI:[id number]  Outpatient Primary MD for the patient is Patient, No Pcp Per    LOS - 16  Admit date - 12/06/2023    Chief Complaint  Patient presents with   Abdominal Pain       Brief Narrative (HPI from H&P)      63 y.o. 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  -He did not want to go back to [facility], he was also due for a TEE and was admitted to the hospital here for further care.   Subjective:   No significant events overnight, patient reports overall he started to feel better   Assessment  & Plan :    MRSA bacteremia, C2-C3 osteomyelitis  right prosthetic hip joint infection/septic arthritis previous suspicion of tricuspid valve endocarditis at [facility] IV drug use, history of noncompliance Noncompliance - TTE ([facility]) revealed LVEF 20-25%, tricuspid valve appears thickened with possible mobile component, TTE here does not show any clear evidence of endocarditis, cardiology consulted and following the patient, he was deemed too high risk for TEE as he has underlying CAD with possible severe left main disease and a EF of 20% along with esophageal stricture. - Antibiotics management per ID, on IV vancomycin - Per neurosurgeon Dr. Conchita Paris C2-C3 osteomyelitis requires  medical treatment only, orthopedics following him. - Patient with right prosthetic hip infection/septic arthritis, post right hip abscess aspiration, culture growing MRSA, reevaluated by orthopedic, he will need surgical intervention, but likely pelvic reconstruction surgery, this will need to be done in tertiary center.  I have discussed with the patient, at this point patient would like to avoid surgeries, would like to continue with current conservative management for now, and he said he would  consider surgery later.   -For now continue IV Vancomycin with stop date 01/17/2024 via PICC line, he is homeless, IV drug user, not compliant with appointments and ID feels that prolonged IV antibiotics in the hospital is the safest course for now.  Of note patient was to get Dalbavancin prior to [facility] but appears that he never received it.    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  No signs of acute infection, C-spine MRI changes discussed with Dr. Conchita Paris, medical treatment.  Supportive care for brain, history of narcotic seeking behavior.   Lactic acidosis  - due to dehydration.  Has been hydrated.   Chronic HFrEF  - LVEF 20 to 25%, cardiology following.  Continue on Aldactone and Coreg, diurese as needed   Possible acute pyelonephritis seen on CT scan, POA  - Treated with IV antibiotics, As needed analgesics, negative urine culture.   History of PE/DVT on Eliquis  - Resume home Eliquis also has IVC filter.   History of polysubstance abuse   -Denies recent use of IV drugs, most recently his urine drug screen was positive on care everywhere at [facility] for amphetamine and cocaine in February 2025.   Tobacco use disorder   - Current smoker, Nicotine patch   Generalized weakness   -PT OT assessment, Fall precautions   Severe protein calorie  malnutrition  -- Dietitian consult, protein supplementation  Hyponatremia.  - SIADH, recommended fluid restrictions, required Samsca.   Incidental finding of right inguinal hernia. -  No acute issues.  Monitor  Hep C infection -Management as an outpatient  No IV access.  PICC line placed 12/09/2023 after much counseling       Condition - Extremely Guarded  Family Communication  :  None present  Code Status :   Full  Consults  :  ID, Cards, Dr. Conchita Paris Neurosurgery, orthopedics, IR, palliative care  PUD Prophylaxis :  Pepcid   Procedures  :     PICC line placed 12/09/2023   CT R Hip - 1. Status post total right hip arthroplasty. 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 2. There is complex joint fluid inferior to the right femoral neck prosthesis with mild-to-moderate joint capsule thickening. This fluid extends into the right obturator externus muscle and into the right iliopsoas bursa. There also appears to be posterior extension of joint fluid into a fluid collection wrapping around the posterior aspect of the right greater trochanter and extending superiorly into the right gluteus medius muscle. This is suspicious for right humerus however septic arthritis with the infected joint fluid extending peripherally. 3. Moderate right gluteus minimus and medius muscle edema. Focal edema within the anterior aspect of the left gluteus medius muscle. 4. There is metallic susceptibility artifact in the region of the posterior right hip/buttock. Recommend clinical correlation for possible surgical skin staples in this region.   TTE - 1. Left ventricular ejection fraction, by estimation, is 20 to 25%. The left ventricle has severely decreased function. The left ventricle demonstrates  global hypokinesis. The left ventricular internal cavity size was mildly dilated. Left ventricular diastolic parameters are consistent with Grade I diastolic dysfunction (impaired relaxation). No LV thrombus noted.  2. Right ventricular systolic  function is mildly reduced. The right ventricular size is normal. Tricuspid regurgitation signal is inadequate for assessing PA pressure.  3. The mitral valve is normal in structure. No evidence of mitral valve regurgitation. No evidence of mitral stenosis. Moderate mitral annular calcification.  4. The aortic valve was not well visualized. Aortic valve regurgitation is not visualized. No aortic stenosis is present.  5. IVC not visualized.  MRI brain.  Nonacute.    MRI C-spine.   Severely motion limited noncontrast study. Significant marrow edema surrounding the left C2-C3 facet joint and extending to involve the posterior left C2 and C3 vertebral bodies with surrounding paraspinal edema, concerning for septic arthritis and possibly discitis/osteomyelitis given the reported clinical concern for infection. A repeat MRI of the cervical spine with contrast is recommended when the patient is able (possibly with sedation) to better evaluate.  MRI L-spine.  1. Chronic compression fractures of T12, L2, L3, and L5 with no complicating features. 2. Subtle and nonspecific bilateral erector spinae muscle edema. A similar appearance can be seen in chronically debilitated patients. 3. 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 4. Capacious lumbar spinal canal with no spinal stenosis  CT scan abdomen pelvis.1. Emphysematous changes and chronic bronchitic changes in the lung bases. Trace bilateral pleural effusions. 2. Heterogeneous nephrograms bilaterally suggesting acute pyelonephritis. No abscess or hydronephrosis. 3. Moderate-sized right inguinal hernia containing bowel  without obvious proximal obstruction. Visualization is limited due to streak artifact. 4. Aortic atherosclerosis.  Inferior vena caval filter. 5. Small amount of free fluid in the abdomen of nonspecific etiology      Disposition Plan  :    Status is: Inpatient   DVT Prophylaxis  :     apixaban (ELIQUIS) tablet 5 mg     Lab Results  Component Value Date   PLT 264 12/21/2023    Diet :  Diet Order             Diet regular Room service appropriate? Yes; Fluid consistency: Thin; Fluid restriction: 1200 mL Fluid  Diet effective now                    Inpatient Medications  Scheduled Meds:  apixaban  5 mg Oral BID   atorvastatin  80 mg Oral QHS   carvedilol  3.125 mg Oral BID WC   Chlorhexidine Gluconate Cloth  6 each Topical Daily   docusate sodium  100 mg Oral BID   famotidine  20 mg Oral Daily   feeding supplement  237 mL Oral TID BM   multivitamin with minerals  1 tablet Oral Daily   saccharomyces boulardii  250 mg Oral BID   spironolactone  25 mg Oral Daily   Continuous Infusions:  vancomycin 1,000 mg (12/23/23 1301)   PRN Meds:.acetaminophen, camphor-menthol, diphenhydrAMINE, HYDROmorphone (DILAUDID) injection, melatonin, naLOXone (NARCAN)  injection, oxyCODONE, polyethylene glycol, prochlorperazine, sodium chloride flush     Objective:   Vitals:   12/22/23 0821 12/22/23 1157 12/22/23 1626 12/23/23 0500  BP: 124/79 (!) 113/57 116/77   Pulse: 90 74 80   Resp: 18 18 18    Temp: 98.7 F (37.1 C) 98.7 F (37.1 C) 99.3 F (37.4 C)   TempSrc: Oral Oral Oral   SpO2: (!) 88% 100% 97%   Weight:    55.5 kg  Height:        Wt Readings from Last 3 Encounters:  12/23/23 55.5 kg  10/23/18 72.6 kg  09/09/18 70.3 kg     Intake/Output Summary (Last 24 hours) at 12/23/2023 1537 Last data filed at 12/23/2023 0542 Gross per 24 hour  Intake --  Output 1800 ml  Net -1800 ml      Physical Exam  Awake, alert, cachectic, chronically ill-appearing Good air  entry Regular rate and rhythm Abdomen soft Extremities with no edema      Data Review:    Recent Labs  Lab 12/17/23 0316 12/20/23 0359 12/21/23 0316  WBC 7.8 7.7 8.4  HGB 9.7* 9.3* 9.0*  HCT 29.4* 28.3* 27.4*  PLT 289 236 264  MCV 87.2 86.5 87.0  MCH 28.8 28.4 28.6  MCHC 33.0 32.9 32.8  RDW 16.1* 16.2* 16.7*  LYMPHSABS 1.0  --   --   MONOABS 0.7  --   --   EOSABS 0.5  --   --   BASOSABS 0.0  --   --     Recent Labs  Lab 12/17/23 0316 12/19/23 0324 12/20/23 0359 12/21/23 0316 12/23/23 0214  NA 128* 126* 131* 129* 128*  K 3.7 4.1 4.2 4.0 4.5  CL 95* 94* 98 96* 93*  CO2 27 23 24 24 26   ANIONGAP 6 9 9 9 9   GLUCOSE 97 99 97 110* 93  BUN 19 23 17 19  25*  CREATININE 0.88 0.81 0.73 0.86 0.91  AST 22  --   --   --   --   ALT 18  --   --   --   --   ALKPHOS 68  --   --   --   --   BILITOT 0.6  --   --   --   --   ALBUMIN 2.0*  --   --   --   --   PROCALCITON <0.10  --   --   --   --   CALCIUM 8.6* 8.7* 8.4* 8.7* 8.8*      Recent Labs  Lab 12/17/23 0316 12/19/23 0324 12/20/23 0359 12/21/23 0316 12/23/23 0214  PROCALCITON <0.10  --   --   --   --   CALCIUM 8.6* 8.7* 8.4* 8.7* 8.8*    --------------------------------------------------------------------------------------------------------------- Lab Results  Component Value Date   CHOL 114 12/09/2023   HDL 43 12/09/2023   LDLCALC 59 12/09/2023   TRIG 59 12/09/2023   CHOLHDL 2.7 12/09/2023    No results found for: "HGBA1C" No results for input(s): "TSH", "T4TOTAL", "FREET4", "T3FREE", "THYROIDAB" in the last 72 hours. No results for input(s): "VITAMINB12", "FOLATE", "FERRITIN", "TIBC", "IRON", "RETICCTPCT" in the last 72 hours. ------------------------------------------------------------------------------------------------------------------ Cardiac Enzymes No results for input(s): "CKMB", "TROPONINI", "MYOGLOBIN" in the last 168 hours.  Invalid input(s): "CK"  Micro Results Recent Results (from  the past 240 hours)  Aerobic/Anaerobic Culture w Gram Stain (surgical/deep wound)     Status: None   Collection Time: 12/18/23  1:15 PM   Specimen: Joint, Right Hip; Body Fluid  Result Value Ref Range Status   Specimen Description JOINT FLUID RIGHT HIP  Final   Special Requests NONE  Final   Gram Stain   Final    ABUNDANT WBC PRESENT, PREDOMINANTLY PMN NO ORGANISMS SEEN    Culture   Final    RARE METHICILLIN RESISTANT STAPHYLOCOCCUS AUREUS NO ANAEROBES ISOLATED Performed at [facility], [address], [place], [place] [address]    Report Status 12/23/2023 FINAL  Final   Organism ID, Bacteria METHICILLIN RESISTANT STAPHYLOCOCCUS AUREUS  Final      Susceptibility   Methicillin resistant staphylococcus aureus - MIC*    CIPROFLOXACIN <=0.5 SENSITIVE Sensitive     ERYTHROMYCIN >=8 RESISTANT Resistant     GENTAMICIN <=0.5 SENSITIVE Sensitive     OXACILLIN >=4 RESISTANT Resistant     TETRACYCLINE >=16 RESISTANT Resistant     VANCOMYCIN 1 SENSITIVE Sensitive     TRIMETH/SULFA <=10 SENSITIVE Sensitive     CLINDAMYCIN >=8 RESISTANT Resistant     RIFAMPIN <=0.5 SENSITIVE Sensitive     Inducible Clindamycin NEGATIVE Sensitive     LINEZOLID 2 SENSITIVE Sensitive     * RARE METHICILLIN RESISTANT STAPHYLOCOCCUS AUREUS     Radiology Report No results found.     Signature  -   Huey Bienenstock M.D on 12/23/2023 at 3:37 PM   -  To page go to [email or web address]

## 2023-12-23 NOTE — Plan of Care (Signed)

## 2023-12-23 NOTE — Progress Notes (Signed)
 Nutrition Follow-up  DOCUMENTATION CODES:   Severe malnutrition in context of chronic illness  INTERVENTION:  Continue double proteins to supplement intake Continue liberalized diet to encourage adequate calorie/protein intake Continue Ensure Enlive po BID, each supplement provides 350 kcal and 20 grams of protein. Continue Magic cup TID with meals, each supplement provides 290 kcal and 9 grams of protein Continue MVI w/ minerals Continue Florastor r/t ABX administration to maintain gut microbiome Initiate bowel regimen  NUTRITION DIAGNOSIS:   Severe Malnutrition related to chronic illness (COPD, Hep C, hx IV drug use) as evidenced by severe fat depletion, severe muscle depletion. - remains applicable  GOAL:  Patient will meet greater than or equal to 90% of their needs - progressing  MONITOR:  PO intake, Supplement acceptance, Weight trends  REASON FOR ASSESSMENT:  Consult Assessment of nutrition requirement/status  ASSESSMENT:   Pt with PMH: MRSA bacteremia, chronic HFrEF(20-25%), PE/DVT, chronic Hep C, hx IV drug use, COPD, and hx nonischemic myopathy endorsed as 2/2 to methamphetamine use. Presents to ED with c/o lower back pain. Of note, discharged from [facility] 3/22 and prefers not to return.  3/8-3/21 admitetd to [facility] in [place] for acute hypoxic respiratory failure 3/22 admitted 3/23 CXR trace bilateral pleural effusions 3/24 PICC line placed  3/25 IV ABX continue 4/1 aspiration of R hip abscess (MRSA)  Remains on IV vancomycin slated to stop 5/2 via PICC line. Had R hip abscess aspirated. Cultures grew MRSA. Ortho engaged and evaluated patient. Will likely need surgical intervention that will require admission to tertiary center. Pt is aware of this and considering surgery while current plan of care continues.   Average Meal Intake 4/4: 0-50% x3 documented meals 4/5: 50% x2 documented meals 4/6: 50% x3 documented meals   Intake remains marginally  adequate. Averaging 50%, however was with significant constipation, which has now resolved. Continues with double portion proteins. Accepting Ensure [employer]. Magic Cup remains on his tray.  No new difficulties chewing or swallowing.    Admit Weight: 55.8kg Current Weight: 55.5kg   Wt stable during this admission. Remains cachectic in appearance. No edema noted. Some significant constipation over the weekend. This has resolved with magnesium citrate. Has not had BM since 4/5. Florastor continues. Consider scheduling bowel regimen, as he is at risk for opioid-induced constipation. MD messaged.   Net IO Since Admission: -7,191.94 mL [12/23/23 0853]   Spironolactone continues. Furosemide discontinued. Long-term antibiotic therapy continues. Hyponatremia remains. Pt on fluid restriction.   Meds: famotidine, MVI, Florastor, spironolactone, IV ABX   Labs: Na+ 131>129>128 (L) CBGs 93-110 x48 hours  Diet Order:   Diet Order             Diet regular Room service appropriate? Yes; Fluid consistency: Thin; Fluid restriction: 1200 mL Fluid  Diet effective now            EDUCATION NEEDS:   Education needs have been addressed  Skin:  Skin Assessment: Skin Integrity Issues: Skin Integrity Issues:: Incisions DTI: open/dehisced wound: mid sacrum Incisions: R hip puncture  Last BM:  4/5  Height:  Ht Readings from Last 1 Encounters:  12/07/23 6\' 1"  (1.854 m)   Weight:  Wt Readings from Last 1 Encounters:  12/23/23 55.5 kg   Ideal Body Weight:  83.6 kg  BMI:  Body mass index is 16.14 kg/m.  Estimated Nutritional Needs:   Kcal:  2000-2200kcal  Protein:  100-115g  Fluid:  >1.9L/day  Myrtie Cruise MS, RD, LDN Registered Dietitian Clinical Nutrition  RD Inpatient Contact Info in Amion

## 2023-12-24 DIAGNOSIS — R7881 Bacteremia: Secondary | ICD-10-CM | POA: Diagnosis not present

## 2023-12-24 DIAGNOSIS — B9562 Methicillin resistant Staphylococcus aureus infection as the cause of diseases classified elsewhere: Secondary | ICD-10-CM | POA: Diagnosis not present

## 2023-12-24 LAB — VANCOMYCIN, TROUGH: Vancomycin Tr: 10 ug/mL — ABNORMAL LOW (ref 15–20)

## 2023-12-24 MED ORDER — VANCOMYCIN HCL 1250 MG/250ML IV SOLN
1250.0000 mg | INTRAVENOUS | Status: DC
  Administered 2023-12-25 – 2023-12-28 (×4): 1250 mg via INTRAVENOUS
  Filled 2023-12-24 (×5): qty 250

## 2023-12-24 NOTE — Progress Notes (Signed)
 Pharmacy Antibiotic Note  [name] [name] is a 63 y.o. male admitted on 12/06/2023 with back pain, possible osteomyelitis.  Admitted 3/8-3/21 at [facility] for MRSA bacteremia and possible endocarditis/osteomyelitis --received Vancomycin throughout admission.  Plan was for discharge 3/21 and administration of Dalvancin weekly, but dose not given.  Pharmacy has been consulted for Vancomycin  dosing.   Per documentation last levels at [facility] on 3/19 on 750mg  q24h with trough of 283 (trough: 7.2 and peak 16.2), adjusted regimen to 1250mg  q24h for eAUC ~480. Scr around 0.8 during this time.     Vancomycin adjusted on 12/21/23 to Vancomycin 1000mg  IV q24h  Steady state peak and trough levels checked.  Scr 0.91 Vanc 1000mg  IV q24h *4/7 vanc dose  given @1301   *4/7 VP = 37 @1500  *4/8 VT = 10 @1129    Plan: Increase Vancomycin  to 1250 mg IV Q24H  Recheck levels at [facility]  Planning to continue Vancomycin therapy while inpt through 5/2   Height: 6\' 1"  (185.4 cm) Weight: 55.5 kg (122 lb 5.7 oz) IBW/kg (Calculated) : 79.9  Temp (24hrs), Avg:98.7 F (37.1 C), Min:98.4 F (36.9 C), Max:99.1 F (37.3 C)  Recent Labs  Lab 12/18/23 1126 12/19/23 0324 12/20/23 0359 12/21/23 0316 12/23/23 0214 12/23/23 1500 12/24/23 1129  WBC  --   --  7.7 8.4  --   --   --   CREATININE  --  0.81 0.73 0.86 0.91  --   --   VANCOTROUGH 14*  --   --   --   --   --  10*  VANCOPEAK  --   --   --   --   --  37  --     Estimated Creatinine Clearance: 65.2 mL/min (by C-G formula based on SCr of 0.91 mg/dL).    Allergies  Allergen Reactions   Fentanyl Itching   Hydromorphone Hcl Itching    Pt  States itching all over Pt  States itching all over Pt  States itching all over    Morphine Itching   Pantoprazole Hives   Pantoprazole Sodium Hives   Haloperidol Other (See Comments)    Pt reports he has "seizures" had haldol on 09/25/21 with no apparent ill effect.  Pt reports he has "seizures"    Nsaids Nausea And Vomiting, Other (See Comments) and Hives    Severe cramping Other reaction(s): Abdominal Pain Cramping per [place]   Morphine And Codeine Itching   Ibuprofen    Ketorolac Other (See Comments)   Pantoprazole Sodium Hives   [name], PharmD PGY1 Pharmacy Resident 12/24/2023 5:00 PM

## 2023-12-24 NOTE — Progress Notes (Signed)
 PROGRESS NOTE                                                                                                                                                                                                             Patient Demographics:    [name] [name], is a 63 y.o. male, DOB - 06-Oct-1960, NFA:[id number]  Outpatient Primary MD for the patient is Patient, No Pcp Per    LOS - 17  Admit date - 12/06/2023    Chief Complaint  Patient presents with   Abdominal Pain       Brief Narrative (HPI from H&P)      63 y.o. 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  -He did not want to go back to [facility], so he was admitted here for further workup, it was significant for right prosthetic hip septic arthritis, and C2-C3 osteomyelitis.  Subjective:   No significant events overnight, reports good BM yesterday, appetite has improved.   Assessment  & Plan :    MRSA bacteremia, C2-C3 osteomyelitis  right prosthetic hip joint infection/septic arthritis previous suspicion of tricuspid valve endocarditis at [facility] IV drug use, history of noncompliance Noncompliance - TTE ([facility]) revealed LVEF 20-25%, tricuspid valve appears thickened with possible mobile component, TTE here does not show any clear evidence of endocarditis, cardiology consulted and following the patient, he was deemed too high risk for TEE as he has underlying CAD with possible severe left main disease and a EF of 20% along with esophageal stricture. -  Antibiotics management per ID, on IV vancomycin - Per neurosurgeon Dr. 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 - Patient with right prosthetic hip infection/septic arthritis, post right hip abscess aspiration, culture growing MRSA, reevaluated by orthopedic, he will need surgical intervention, but likely pelvic reconstruction surgery versus disarticulation, this will need to be done in tertiary center.  I have discussed with the patient, at this point patient would like to avoid surgeries,  would like to continue with current conservative management for now, and he said he would consider surgery later.   -For now continue IV Vancomycin with stop date 01/17/2024 via PICC line, he is homeless, IV drug user, not compliant with appointments and ID feels that prolonged IV antibiotics in the hospital is the safest course for now.    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  No signs of acute infection, C-spine MRI changes discussed with Dr. Conchita Paris, medical treatment.  Supportive care for brain, history of narcotic seeking behavior.   Lactic acidosis  - due to dehydration.  Has been hydrated.   Chronic HFrEF  - LVEF 20 to 25%, cardiology following.  Continue on Aldactone and Coreg, diurese as needed   Possible acute pyelonephritis seen on CT scan, POA  - Treated with IV antibiotics, As needed analgesics, negative urine culture.   History of PE/DVT on Eliquis  - Resume home Eliquis also has IVC filter.   History of polysubstance abuse   -Denies recent use of IV drugs, most recently his urine drug screen was positive on care everywhere at [facility] for amphetamine and cocaine in February 2025.   Tobacco use disorder   - Current smoker, Nicotine patch   Generalized weakness   -PT OT assessment, Fall precautions   Severe protein  calorie malnutrition  -- Dietitian consult, protein supplementation  Hyponatremia.  - SIADH, recommended fluid restrictions, required Samsca.   Incidental finding of right inguinal hernia. -  No acute issues.  Monitor  Hep C infection -Management as an outpatient  No IV access.  PICC line placed 12/09/2023 after much counseling       Condition - Extremely Guarded  Family Communication  :  None present  Code Status :   Full  Consults  :  ID, Cards, Dr. Conchita Paris Neurosurgery, orthopedics, IR, palliative care  PUD Prophylaxis :  Pepcid   Procedures  :     PICC line placed 12/09/2023   CT R Hip - 1. Status post total right hip arthroplasty. 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 2. There is complex joint fluid inferior to the right femoral neck prosthesis with mild-to-moderate joint capsule thickening. This fluid extends into the right obturator externus muscle and into the right iliopsoas bursa. There also appears to be posterior extension of joint fluid into a fluid collection wrapping around the posterior aspect of the right greater trochanter and extending superiorly into the right gluteus medius muscle. This is suspicious for right humerus however septic arthritis with the infected joint fluid extending peripherally. 3. Moderate right gluteus minimus and medius muscle edema. Focal edema within the anterior aspect of the left gluteus medius muscle. 4. There is metallic susceptibility artifact in the region of the posterior right hip/buttock. Recommend clinical correlation for possible surgical skin staples in this region.   TTE - 1. Left ventricular ejection fraction, by estimation, is 20 to 25%. The left ventricle has severely decreased function. The left ventricle  demonstrates global hypokinesis. The left ventricular internal cavity size was mildly dilated. Left ventricular diastolic parameters are consistent with Grade I diastolic dysfunction (impaired relaxation). No LV thrombus noted.  2. Right ventricular systolic function is mildly reduced. The right ventricular  size is normal. Tricuspid regurgitation signal is inadequate for assessing PA pressure.  3. The mitral valve is normal in structure. No evidence of mitral valve regurgitation. No evidence of mitral stenosis. Moderate mitral annular calcification.  4. The aortic valve was not well visualized. Aortic valve regurgitation is not visualized. No aortic stenosis is present.  5. IVC not visualized.  MRI brain.  Nonacute.    MRI C-spine.   Severely motion limited noncontrast study. Significant marrow edema surrounding the left C2-C3 facet joint and extending to involve the posterior left C2 and C3 vertebral bodies with surrounding paraspinal edema, concerning for septic arthritis and possibly discitis/osteomyelitis given the reported clinical concern for infection. A repeat MRI of the cervical spine with contrast is recommended when the patient is able (possibly with sedation) to better evaluate.  MRI L-spine.  1. Chronic compression fractures of T12, L2, L3, and L5 with no complicating features. 2. Subtle and nonspecific bilateral erector spinae muscle edema. A similar appearance can be seen in chronically debilitated patients. 3. 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 4. Capacious lumbar spinal canal with no spinal stenosis  CT scan abdomen pelvis.1. Emphysematous changes and chronic bronchitic changes in the lung bases. Trace bilateral pleural effusions. 2. Heterogeneous nephrograms bilaterally suggesting acute pyelonephritis. No abscess or hydronephrosis. 3. Moderate-sized right inguinal hernia  containing bowel without obvious proximal obstruction. Visualization is limited due to streak artifact. 4. Aortic atherosclerosis.  Inferior vena caval filter. 5. Small amount of free fluid in the abdomen of nonspecific etiology      Disposition Plan  :    Status is: Inpatient   DVT Prophylaxis  :     apixaban (ELIQUIS) tablet 5 mg     Lab Results  Component Value Date   PLT 264 12/21/2023    Diet :  Diet Order             Diet regular Room service appropriate? Yes; Fluid consistency: Thin; Fluid restriction: 1200 mL Fluid  Diet effective now                    Inpatient Medications  Scheduled Meds:  apixaban  5 mg Oral BID   atorvastatin  80 mg Oral QHS   carvedilol  3.125 mg Oral BID WC   Chlorhexidine Gluconate Cloth  6 each Topical Daily   docusate sodium  100 mg Oral BID   famotidine  20 mg Oral Daily   feeding supplement  237 mL Oral TID BM   multivitamin with minerals  1 tablet Oral Daily   saccharomyces boulardii  250 mg Oral BID   spironolactone  25 mg Oral Daily   Continuous Infusions:  vancomycin 1,000 mg (12/24/23 1150)   PRN Meds:.acetaminophen, camphor-menthol, diphenhydrAMINE, HYDROmorphone (DILAUDID) injection, melatonin, naLOXone (NARCAN)  injection, oxyCODONE, polyethylene glycol, prochlorperazine, sodium chloride flush     Objective:   Vitals:   12/22/23 1626 12/23/23 0500 12/23/23 2120 12/24/23 0828  BP: 116/77  106/72 109/76  Pulse: 80  83   Resp: 18  18 16   Temp: 99.3 F (37.4 C)  99.1 F (37.3 C) 98.6 F (37 C)  TempSrc: Oral  Oral Oral  SpO2: 97%   98%  Weight:  55.5 kg    Height:        Wt Readings from Last 3 Encounters:  12/23/23 55.5 kg  10/23/18 72.6 kg  09/09/18  70.3 kg     Intake/Output Summary (Last 24 hours) at 12/24/2023 1335 Last data filed at 12/24/2023 0400 Gross per 24 hour  Intake 1060 ml  Output 1020 ml  Net 40 ml      Physical Exam  Awake, alert, cachectic, chronically ill-appearing Good  air entry bilaterally Rate and rhythm Abdomen soft Extremities with no edema      Data Review:    Recent Labs  Lab 12/20/23 0359 12/21/23 0316  WBC 7.7 8.4  HGB 9.3* 9.0*  HCT 28.3* 27.4*  PLT 236 264  MCV 86.5 87.0  MCH 28.4 28.6  MCHC 32.9 32.8  RDW 16.2* 16.7*    Recent Labs  Lab 12/19/23 0324 12/20/23 0359 12/21/23 0316 12/23/23 0214  NA 126* 131* 129* 128*  K 4.1 4.2 4.0 4.5  CL 94* 98 96* 93*  CO2 23 24 24 26   ANIONGAP 9 9 9 9   GLUCOSE 99 97 110* 93  BUN 23 17 19  25*  CREATININE 0.81 0.73 0.86 0.91  CALCIUM 8.7* 8.4* 8.7* 8.8*      Recent Labs  Lab 12/19/23 0324 12/20/23 0359 12/21/23 0316 12/23/23 0214  CALCIUM 8.7* 8.4* 8.7* 8.8*    --------------------------------------------------------------------------------------------------------------- Lab Results  Component Value Date   CHOL 114 12/09/2023   HDL 43 12/09/2023   LDLCALC 59 12/09/2023   TRIG 59 12/09/2023   CHOLHDL 2.7 12/09/2023    No results found for: "HGBA1C" No results for input(s): "TSH", "T4TOTAL", "FREET4", "T3FREE", "THYROIDAB" in the last 72 hours. No results for input(s): "VITAMINB12", "FOLATE", "FERRITIN", "TIBC", "IRON", "RETICCTPCT" in the last 72 hours. ------------------------------------------------------------------------------------------------------------------ Cardiac Enzymes No results for input(s): "CKMB", "TROPONINI", "MYOGLOBIN" in the last 168 hours.  Invalid input(s): "CK"  Micro Results Recent Results (from the past 240 hours)  Aerobic/Anaerobic Culture w Gram Stain (surgical/deep wound)     Status: None   Collection Time: 12/18/23  1:15 PM   Specimen: Joint, Right Hip; Body Fluid  Result Value Ref Range Status   Specimen Description JOINT FLUID RIGHT HIP  Final   Special Requests NONE  Final   Gram Stain   Final    ABUNDANT WBC PRESENT, PREDOMINANTLY PMN NO ORGANISMS SEEN    Culture   Final    RARE METHICILLIN RESISTANT STAPHYLOCOCCUS  AUREUS NO ANAEROBES ISOLATED Performed at [facility], [address], [place], [place] [address]    Report Status 12/23/2023 FINAL  Final   Organism ID, Bacteria METHICILLIN RESISTANT STAPHYLOCOCCUS AUREUS  Final      Susceptibility   Methicillin resistant staphylococcus aureus - MIC*    CIPROFLOXACIN <=0.5 SENSITIVE Sensitive     ERYTHROMYCIN >=8 RESISTANT Resistant     GENTAMICIN <=0.5 SENSITIVE Sensitive     OXACILLIN >=4 RESISTANT Resistant     TETRACYCLINE >=16 RESISTANT Resistant     VANCOMYCIN 1 SENSITIVE Sensitive     TRIMETH/SULFA <=10 SENSITIVE Sensitive     CLINDAMYCIN >=8 RESISTANT Resistant     RIFAMPIN <=0.5 SENSITIVE Sensitive     Inducible Clindamycin NEGATIVE Sensitive     LINEZOLID 2 SENSITIVE Sensitive     * RARE METHICILLIN RESISTANT STAPHYLOCOCCUS AUREUS     Radiology Report No results found.     Signature  -   Huey Bienenstock M.D on 12/24/2023 at 1:35 PM   -  To page go to [email or web address]

## 2023-12-25 DIAGNOSIS — I079 Rheumatic tricuspid valve disease, unspecified: Secondary | ICD-10-CM | POA: Diagnosis not present

## 2023-12-25 DIAGNOSIS — S22000A Wedge compression fracture of unspecified thoracic vertebra, initial encounter for closed fracture: Secondary | ICD-10-CM

## 2023-12-25 DIAGNOSIS — I255 Ischemic cardiomyopathy: Secondary | ICD-10-CM | POA: Diagnosis not present

## 2023-12-25 DIAGNOSIS — R7881 Bacteremia: Secondary | ICD-10-CM | POA: Diagnosis not present

## 2023-12-25 DIAGNOSIS — F1990 Other psychoactive substance use, unspecified, uncomplicated: Secondary | ICD-10-CM | POA: Diagnosis not present

## 2023-12-25 LAB — BASIC METABOLIC PANEL WITH GFR
Anion gap: 9 (ref 5–15)
BUN: 23 mg/dL (ref 8–23)
CO2: 27 mmol/L (ref 22–32)
Calcium: 8.8 mg/dL — ABNORMAL LOW (ref 8.9–10.3)
Chloride: 90 mmol/L — ABNORMAL LOW (ref 98–111)
Creatinine, Ser: 0.81 mg/dL (ref 0.61–1.24)
GFR, Estimated: >60 mL/min (ref >60)
Glucose, Bld: 94 mg/dL (ref 70–99)
Potassium: 4.1 mmol/L (ref 3.5–5.1)
Sodium: 126 mmol/L — ABNORMAL LOW (ref 135–145)

## 2023-12-25 MED ORDER — HYDROCERIN EX CREA
TOPICAL_CREAM | Freq: Two times a day (BID) | CUTANEOUS | Status: DC
  Administered 2023-12-28 – 2024-01-03 (×2): 1 via TOPICAL
  Filled 2023-12-25: qty 113

## 2023-12-25 MED ORDER — FUROSEMIDE 40 MG PO TABS
40.0000 mg | ORAL_TABLET | Freq: Every day | ORAL | Status: DC
  Administered 2023-12-25 – 2024-01-02 (×9): 40 mg via ORAL
  Filled 2023-12-25 (×9): qty 1

## 2023-12-25 NOTE — TOC Progression Note (Signed)
 Transition of Care [facility]) - Progression Note    Patient Details  Name: [name] [name] MRN: [id number] Date of Birth: 1961-08-31  [facility] CM/SW Contact  Mearl Latin, LCSW Phone Number: 12/25/2023, 3:59 PM  Clinical Narrative:    TOC continuing to follow.      Barriers to Discharge: Continued Medical Work up, Inadequate or no insurance, SNF Pending bed offer, Active Substance Use - Placement  Expected Discharge Plan and Services In-house Referral: Clinical Social Work   Post Acute Care Choice: Skilled Nursing Facility Living arrangements for the past 2 months: Homeless, Hotel/Motel                                       Social Determinants of Health (SDOH) Interventions SDOH Screenings   Food Insecurity: Medium Risk (11/24/2023)   Received from [facility]  Housing: High Risk (11/24/2023)   Received from [facility]  Transportation Needs: Unmet Transportation Needs (11/24/2023)   Received from [facility]  Utilities: Unknown (11/24/2023)   Received from [facility]  Recent Concern: Utilities - High Risk (11/04/2023)   Received from [facility]  Financial Resource Strain: Low Risk  (06/22/2022)   Received from [facility]  Social Connections: Unknown (01/26/2022)   Received from [facility]  Stress: No Stress Concern Present (07/01/2023)   Received from [facility]  Tobacco Use: High Risk (12/06/2023)    Readmission Risk Interventions     No data to display

## 2023-12-25 NOTE — Progress Notes (Signed)
 PROGRESS NOTE        PATIENT DETAILS Name: [name] [name] Age: 63 y.o. Sex: male Date of Birth: 08/12/1961 Admit Date: 12/06/2023 Admitting Physician Darlin Drop, DO QMV:HQIONGE, No Pcp Per  Brief Summary: Patient is a 63 y.o.  male with history of of chronic HFrEF, VTE, IV drug use, COPD chronic HCV-recent hospitalization at [facility] from 3/8-3/21 for MRSA bacteremia with endocarditis-patient was uncooperative during that hospitalization-and refused treatment including MRIs-he was given 1 dose of dalbavancin and discharged home.  Patient subsequently presented to [facility] on 3/21 with back pain and subsequently admitted to the hospitalist service.  Significant events: 3/8-3/21>> hospitalization at [facility]-MRSA bacteremia-endocarditis refused MRI/not cooperative-discharged after getting IV dalbavancin. 3/21>> admit to [facility] for low back pain.  Significant studies: 3/21>> CT abdomen/pelvis: Emphysema/bronchitis changes lung bases-moderate-sized right inguinal hernia-heterogeneous nephrograms suggesting acute pyelonephritis. 3/22>> MRI LS spine: Chronic compression fractures of T12, L2, L3 and L5. 3/24>> MRI brain: No acute infarct 3/24>> MRI C-spine: Marrow edema surrounding left C2-C3 facet joint-involving posterior C2/C3 bodies with surrounding paraspinal edema-concerning for septic arthritis/discitis/osteomyelitis. 3/24>> echo: EF 20-25%, severe global hypokinesis. 3/25>> MRI right hip: S/p total right hip arthroplasty-complex fluid inferior to the right femoral neck prosthesis-fluid extends into the right obturator externus muscle into the right iliopsoas bursa.   Significant microbiology data: 3/21>> blood cultures: No growth 4/02>> synovial fluid right hip: MRSA.  Procedures: 3/24>> PICC line 4/02>> CT-guided fluid aspiration from right hip.  Consults: Cardiology Orthopedics Palliative care IR Infectious disease  Subjective: Lying  comfortably in bed-denies any chest pain or shortness of breath.  Objective: Vitals: Blood pressure 131/79, pulse 89, temperature 98 F (36.7 C), temperature source Oral, resp. rate 16, height 6\' 1"  (1.854 m), weight 55.5 kg, SpO2 98%.   Exam: Gen Exam:Alert awake-not in any distress HEENT:atraumatic, normocephalic Chest: B/L clear to auscultation anteriorly CVS:S1S2 regular Abdomen:soft non tender, non distended Extremities:no edema Neurology: Non focal Skin: no rash  Pertinent Labs/Radiology:    Latest Ref Rng & Units 12/21/2023    3:16 AM 12/20/2023    3:59 AM 12/17/2023    3:16 AM  CBC  WBC 4.0 - 10.5 K/uL 8.4  7.7  7.8   Hemoglobin 13.0 - 17.0 g/dL 9.0  9.3  9.7   Hematocrit 39.0 - 52.0 % 27.4  28.3  29.4   Platelets 150 - 400 K/uL 264  236  289     Lab Results  Component Value Date   NA 126 (L) 12/25/2023   K 4.1 12/25/2023   CL 90 (L) 12/25/2023   CO2 27 12/25/2023      Assessment/Plan: 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 Evaluated by ID-IV vancomycin-stop date 5/2 Evaluated by neurosurgery-Dr. Cleda Daub treatment for C2-C3 osteomyelitis/discitis Evaluated by cardiology-TEE deferred due to his medical issues/esophageal stricture. Evaluated by orthopedics-given right hip prosthetic infection-recommendations are for reconstructive surgery/versus disarticulation-at tertiary center.  Dr. Randol Kern discussed with patient-he would like to avoid surgery is currently-and would like to continue conservative measures for now-and follow-up with his primary surgeon at [facility]. Given history of drug use-noncompliance-not a candidate for outpatient IV antibiotic therapy-unfortunately will have to remain inpatient until 5/2.  T12, L2, L3 and L5 compression  fractures Supportive care.  Chronic HFrEF Euvolemic. Continue Coreg/Aldactone Add Lasix today-sodium dropping.  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 Sodium dropping-adding Lasix Fluid restrictions Follow electrolytes  Chronic hepatitis C infection Outpatient management  Polysubstance abuse/IVDA Per prior notes-recent drug screen at [facility] positive for amphetamine/cocaine-February 2025 Counseled extensively  Tobacco abuse Transdermal nicotine Counseled extensively  Incidental finding of right inguinal hernia on CT imaging No abdominal pain-no signs of obstruction/incarceration  Nutrition Status: Nutrition Problem: Severe Malnutrition Etiology: chronic illness (COPD, Hep C, hx IV drug use) Signs/Symptoms: severe fat depletion, severe muscle depletion Interventions: Ensure Enlive (each supplement provides 350kcal and 20 grams of protein), MVI, Magic cup, Liberalize Diet, Refer to RD note for recommendations  Underweight: Estimated body mass index is 16.14 kg/m as calculated from the following:   Height as of this encounter: 6\' 1"  (1.854 m).   Weight as of this encounter: 55.5 kg.   Code status:   Code Status: Full Code   DVT Prophylaxis: apixaban (ELIQUIS) tablet 5 mg    Family Communication:  None at bedside  Disposition Plan: Status is: Inpatient Remains inpatient appropriate because: Severity of illness   Planned Discharge Destination:Home   Diet: Diet Order             Diet regular Room service appropriate? Yes; Fluid consistency: Thin; Fluid restriction: 1200 mL Fluid  Diet effective now                     Antimicrobial agents: Anti-infectives (From admission, onward)    Start     Dose/Rate Route Frequency Ordered Stop   12/25/23 1000  vancomycin (VANCOREADY) IVPB 1250 mg/250 mL        1,250 mg 166.7 mL/hr over 90 Minutes Intravenous Every 24 hours 12/24/23 1705      12/19/23 1200  vancomycin (VANCOCIN) IVPB 1000 mg/200 mL premix  Status:  Discontinued        1,000 mg 200 mL/hr over 60 Minutes Intravenous Every 24 hours 12/18/23 1251 12/24/23 1705   12/13/23 1200  vancomycin (VANCOREADY) IVPB 1250 mg/250 mL  Status:  Discontinued        1,250 mg 166.7 mL/hr over 90 Minutes Intravenous Every 24 hours 12/13/23 0729 12/18/23 1251   12/11/23 1200  vancomycin (VANCOCIN) IVPB 1000 mg/200 mL premix  Status:  Discontinued        1,000 mg 200 mL/hr over 60 Minutes Intravenous Every 24 hours 12/11/23 0805 12/13/23 0729   12/09/23 1200  vancomycin (VANCOREADY) IVPB 1250 mg/250 mL  Status:  Discontinued        1,250 mg 166.7 mL/hr over 90 Minutes Intravenous Every 24 hours 12/09/23 1101 12/11/23 0805   12/08/23 1245  linezolid (ZYVOX) tablet 600 mg  Status:  Discontinued        600 mg Oral Every 12 hours 12/08/23 1159 12/09/23 1056   12/08/23 1000  cefTRIAXone (ROCEPHIN) 1 g in sodium chloride 0.9 % 100 mL IVPB  Status:  Discontinued        1 g 200 mL/hr over 30 Minutes Intravenous Every 24 hours 12/08/23 0912 12/09/23 1001   12/08/23 0600  vancomycin (VANCOREADY) IVPB 1250 mg/250 mL  Status:  Discontinued        1,250 mg 166.7 mL/hr over 90 Minutes Intravenous Every 24 hours 12/07/23 1107 12/08/23 1159   12/07/23 0600  vancomycin (VANCOCIN) IVPB 1000 mg/200 mL premix  Status:  Discontinued  1,000 mg 200 mL/hr over 60 Minutes Intravenous Every 24 hours 12/07/23 0105 12/07/23 1107        MEDICATIONS: Scheduled Meds:  apixaban  5 mg Oral BID   atorvastatin  80 mg Oral QHS   carvedilol  3.125 mg Oral BID WC   Chlorhexidine Gluconate Cloth  6 each Topical Daily   docusate sodium  100 mg Oral BID   famotidine  20 mg Oral Daily   feeding supplement  237 mL Oral TID BM   multivitamin with minerals  1 tablet Oral Daily   saccharomyces boulardii  250 mg Oral BID   spironolactone  25 mg Oral Daily   Continuous Infusions:  vancomycin     PRN  Meds:.acetaminophen, camphor-menthol, diphenhydrAMINE, HYDROmorphone (DILAUDID) injection, melatonin, naLOXone (NARCAN)  injection, oxyCODONE, polyethylene glycol, prochlorperazine, sodium chloride flush   I have personally reviewed following labs and imaging studies  LABORATORY DATA: CBC: Recent Labs  Lab 12/20/23 0359 12/21/23 0316  WBC 7.7 8.4  HGB 9.3* 9.0*  HCT 28.3* 27.4*  MCV 86.5 87.0  PLT 236 264    Basic Metabolic Panel: Recent Labs  Lab 12/19/23 0324 12/20/23 0359 12/21/23 0316 12/23/23 0214 12/25/23 0751  NA 126* 131* 129* 128* 126*  K 4.1 4.2 4.0 4.5 4.1  CL 94* 98 96* 93* 90*  CO2 23 24 24 26 27   GLUCOSE 99 97 110* 93 94  BUN 23 17 19  25* 23  CREATININE 0.81 0.73 0.86 0.91 0.81  CALCIUM 8.7* 8.4* 8.7* 8.8* 8.8*    GFR: Estimated Creatinine Clearance: 73.3 mL/min (by C-G formula based on SCr of 0.81 mg/dL).  Liver Function Tests: No results for input(s): "AST", "ALT", "ALKPHOS", "BILITOT", "PROT", "ALBUMIN" in the last 168 hours. No results for input(s): "LIPASE", "AMYLASE" in the last 168 hours. No results for input(s): "AMMONIA" in the last 168 hours.  Coagulation Profile: No results for input(s): "INR", "PROTIME" in the last 168 hours.  Cardiac Enzymes: No results for input(s): "CKTOTAL", "CKMB", "CKMBINDEX", "TROPONINI" in the last 168 hours.  BNP (last 3 results) No results for input(s): "PROBNP" in the last 8760 hours.  Lipid Profile: No results for input(s): "CHOL", "HDL", "LDLCALC", "TRIG", "CHOLHDL", "LDLDIRECT" in the last 72 hours.  Thyroid Function Tests: No results for input(s): "TSH", "T4TOTAL", "FREET4", "T3FREE", "THYROIDAB" in the last 72 hours.  Anemia Panel: No results for input(s): "VITAMINB12", "FOLATE", "FERRITIN", "TIBC", "IRON", "RETICCTPCT" in the last 72 hours.  Urine analysis:    Component Value Date/Time   COLORURINE YELLOW 12/08/2023 0926   APPEARANCEUR CLEAR 12/08/2023 0926   LABSPEC 1.010 12/08/2023 0926    PHURINE 6.0 12/08/2023 0926   GLUCOSEU NEGATIVE 12/08/2023 0926   HGBUR NEGATIVE 12/08/2023 0926   BILIRUBINUR NEGATIVE 12/08/2023 0926   KETONESUR NEGATIVE 12/08/2023 0926   PROTEINUR NEGATIVE 12/08/2023 0926   UROBILINOGEN 1.0 06/22/2008 1330   NITRITE NEGATIVE 12/08/2023 0926   LEUKOCYTESUR NEGATIVE 12/08/2023 0926    Sepsis Labs: Lactic Acid, Venous    Component Value Date/Time   LATICACIDVEN 1.6 12/07/2023 1500    MICROBIOLOGY: Recent Results (from the past 240 hours)  Aerobic/Anaerobic Culture w Gram Stain (surgical/deep wound)     Status: None   Collection Time: 12/18/23  1:15 PM   Specimen: Joint, Right Hip; Body Fluid  Result Value Ref Range Status   Specimen Description JOINT FLUID RIGHT HIP  Final   Special Requests NONE  Final   Gram Stain   Final    ABUNDANT WBC PRESENT, PREDOMINANTLY PMN  NO ORGANISMS SEEN    Culture   Final    RARE METHICILLIN RESISTANT STAPHYLOCOCCUS AUREUS NO ANAEROBES ISOLATED Performed at [facility], [address], [place], [place] [address]    Report Status 12/23/2023 FINAL  Final   Organism ID, Bacteria METHICILLIN RESISTANT STAPHYLOCOCCUS AUREUS  Final      Susceptibility   Methicillin resistant staphylococcus aureus - MIC*    CIPROFLOXACIN <=0.5 SENSITIVE Sensitive     ERYTHROMYCIN >=8 RESISTANT Resistant     GENTAMICIN <=0.5 SENSITIVE Sensitive     OXACILLIN >=4 RESISTANT Resistant     TETRACYCLINE >=16 RESISTANT Resistant     VANCOMYCIN 1 SENSITIVE Sensitive     TRIMETH/SULFA <=10 SENSITIVE Sensitive     CLINDAMYCIN >=8 RESISTANT Resistant     RIFAMPIN <=0.5 SENSITIVE Sensitive     Inducible Clindamycin NEGATIVE Sensitive     LINEZOLID 2 SENSITIVE Sensitive     * RARE METHICILLIN RESISTANT STAPHYLOCOCCUS AUREUS    RADIOLOGY STUDIES/RESULTS: No results found.   LOS: 18 days   Jeoffrey Massed, MD  [facility]    To contact the attending provider between 7A-7P or the covering provider during after  hours 7P-7A, please log into the web site [email or web address] and access using universal [place] password for that web site. If you do not have the password, please call the hospital operator.  12/25/2023, 8:40 AM

## 2023-12-25 NOTE — Progress Notes (Signed)
 Daily Progress Note   Patient Name: [name] [name]       Date: 12/25/2023 DOB: October 28, 1960  Age: 63 y.o. MRN#: [id number] Attending Physician: Maretta Bees, MD Primary Care Physician: Patient, No Pcp Per Admit Date: 12/06/2023 Length of Stay: 18 days  Reason for Consultation/Follow-up: Establishing goals of care  HPI/Patient Profile:  63 y.o. 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  He was admitted on 12/06/2023 with MRSA bacteremia, C2-C3 osteomyelitis, right hip prosthetic joint infection, previous suspicion of tricuspid valve endocarditis, and others.    Palliative medicine was consulted for GOC conversations.  Subjective:   Subjective: Chart Reviewed. Updates received. Patient Assessed. Created space and opportunity for patient  and family to explore thoughts and feelings regarding current medical situation.  Today's Discussion: Before seeing the patient at the bedside I spent time reviewing the chart.  Patient overall has been stable, culture from IR aspiration found MRSA.  Orthopedics reevaluated and indicated surgery would likely be extensive and need pelvic reconstruction which is not offered locally and would have to be offered at a tertiary center.  This was previously discussed with the patient by myself and he had not made a decision on whether he would want surgery yet.  I saw the patient at bedside and he was awake, greeted me and made/kept eye contact during our conversation.  He was very pleasant during my visit today.  I shared that I was coming in to check on him because stays in the last day for the week, as I said I would.  I asked if he  had thoughts of more about whether or not he would want surgery and he said not really.  However, he Says that he was told that a decision was not needed imminently, likely not for a couple weeks.  I agreed with this and shared that generally he will be in the hospital for probably another 3 to 4 weeks for antibiotics.  I shared that it is good to continue to consider this, although it is a big decision considering the extent of the surgery needed.  I shared the palliative medicine is available to help him with his thoughts and questions related to quality of life and life moving forward after possible surgery.  I recommended he reach out to [place] at any point if he needs help with his thoughts about surgery or care in general.  I asked if he needed anything today and he stated he really just wanted a honey bun, something he enjoys that he has been able to have.  After confirming it was not against his dietary recommendations or orders by discussing with the nurse, I was able to get to a vending machine and get a honey bun for him which seem to bring him an element of joy.  I shared that it is difficult being in the hospital, especially for prolonged stay.  I encouraged him to have these little enjoyments to help him.  I confirmed I would come to see him again on Monday and likely check in 1-2 times a week for the duration of his stay unless we are needed for more intensive discussions.  He is in agreement with  this.  I provided emotional and general support through therapeutic listening, empathy, sharing of stories, and other techniques. I answered all questions and addressed all concerns to the best of my ability.  Review of Systems  Respiratory:  Negative for shortness of breath.   Cardiovascular:  Negative for chest pain.  Gastrointestinal:  Negative for abdominal pain, constipation, nausea and vomiting.    Objective:   Vital Signs:  BP 131/79 (BP Location: Left Arm)   Pulse 86   Temp 98 F (36.7  C) (Oral)   Resp 20   Ht 6\' 1"  (1.854 m)   Wt 55.5 kg   SpO2 98%   BMI 16.14 kg/m   Physical Exam Vitals and nursing note reviewed.  HENT:     Head: Normocephalic and atraumatic.  Cardiovascular:     Rate and Rhythm: Normal rate.  Pulmonary:     Effort: Pulmonary effort is normal. No respiratory distress.  Abdominal:     General: Abdomen is flat. There is no distension.     Palpations: Abdomen is soft.  Skin:    General: Skin is warm and dry.  Neurological:     General: No focal deficit present.     Mental Status: He is alert and oriented to person, place, and time.  Psychiatric:        Mood and Affect: Mood normal.        Behavior: Behavior normal.     Palliative Assessment/Data: 50-60%    Existing Vynca/ACP Documentation: None  Assessment & Plan:   Impression: Present on Admission:  MRSA bacteremia  63 year old male with acute presentation chronic comorbidities as described above. The patient has a significant infection likely from hip joint infection. He refused attempted IR aspiration. After our discussion today he was agreeable to reattempt IR aspiration which found MRSA abscess.  Orthopedics seems to think he will need more extensive pelvic reconstruction for surgical repair which would need to be completed at a tertiary care center.  The patient is thinking on his options and has not made a decision yet.  Overall prognosis guarded to poor pending participation and the medical plan.   SUMMARY OF RECOMMENDATIONS   Full code Full scope of care Continued time for the patient to think about his options and make a decision on surgery Palliative medicine will follow-up on Monday 12/30/23 Please notify us of any significant clinical change or new palliative needs in the interim  Symptom Management:  Per primary team PMT is available to assist as needed  Code Status: Full code  Prognosis: Unable to determine  Discharge Planning: To Be Determined  Discussed  with: Patient, medical team, nursing team  Thank you for allowing [place] to participate in the care of Lloyde Ludlam PMT will continue to support holistically.  Time Total: 31 min  Detailed review of medical records (labs, imaging, vital signs), medically appropriate exam, discussed with treatment team, counseling and education to patient, family, & staff, documenting clinical information, medication management, coordination of care  Wynne Dust, NP Palliative Medicine Team  Team Phone # [phone] (Nights/Weekends)  05/16/2021, 8:17 AM

## 2023-12-25 NOTE — Plan of Care (Signed)

## 2023-12-25 NOTE — Plan of Care (Signed)
 progressing

## 2023-12-26 DIAGNOSIS — I255 Ischemic cardiomyopathy: Secondary | ICD-10-CM | POA: Diagnosis not present

## 2023-12-26 DIAGNOSIS — I079 Rheumatic tricuspid valve disease, unspecified: Secondary | ICD-10-CM | POA: Diagnosis not present

## 2023-12-26 DIAGNOSIS — R7881 Bacteremia: Secondary | ICD-10-CM | POA: Diagnosis not present

## 2023-12-26 DIAGNOSIS — F1990 Other psychoactive substance use, unspecified, uncomplicated: Secondary | ICD-10-CM | POA: Diagnosis not present

## 2023-12-26 LAB — BASIC METABOLIC PANEL WITH GFR
Anion gap: 9 (ref 5–15)
BUN: 30 mg/dL — ABNORMAL HIGH (ref 8–23)
CO2: 26 mmol/L (ref 22–32)
Calcium: 9 mg/dL (ref 8.9–10.3)
Chloride: 93 mmol/L — ABNORMAL LOW (ref 98–111)
Creatinine, Ser: 0.79 mg/dL (ref 0.61–1.24)
GFR, Estimated: >60 mL/min (ref >60)
Glucose, Bld: 94 mg/dL (ref 70–99)
Potassium: 4.1 mmol/L (ref 3.5–5.1)
Sodium: 128 mmol/L — ABNORMAL LOW (ref 135–145)

## 2023-12-26 MED ORDER — TRAZODONE HCL 50 MG PO TABS
50.0000 mg | ORAL_TABLET | Freq: Every evening | ORAL | Status: AC | PRN
  Administered 2023-12-27 (×2): 50 mg via ORAL
  Filled 2023-12-26 (×2): qty 1

## 2023-12-26 MED ORDER — BISACODYL 10 MG RE SUPP
10.0000 mg | Freq: Every day | RECTAL | Status: DC | PRN
  Administered 2023-12-29: 10 mg via RECTAL
  Filled 2023-12-26: qty 1

## 2023-12-26 MED ORDER — SENNOSIDES-DOCUSATE SODIUM 8.6-50 MG PO TABS
2.0000 | ORAL_TABLET | Freq: Every day | ORAL | Status: DC
  Administered 2023-12-26 – 2024-01-02 (×8): 2 via ORAL
  Filled 2023-12-26 (×9): qty 2

## 2023-12-26 MED ORDER — POLYETHYLENE GLYCOL 3350 17 G PO PACK
17.0000 g | PACK | Freq: Two times a day (BID) | ORAL | Status: DC
  Administered 2023-12-26 – 2024-01-02 (×14): 17 g via ORAL
  Filled 2023-12-26 (×19): qty 1

## 2023-12-26 NOTE — Progress Notes (Signed)
 PROGRESS NOTE        PATIENT DETAILS Name: [name] [name] Age: 63 y.o. Sex: male Date of Birth: 1960-11-29 Admit Date: 12/06/2023 Admitting Physician Darlin Drop, DO [id number], No Pcp Per  Brief Summary: Patient is a 63 y.o.  male with history of of chronic HFrEF, VTE, IV drug use, COPD chronic HCV-recent hospitalization at [facility] from 3/8-3/21 for MRSA bacteremia with endocarditis-patient was uncooperative during that hospitalization-and refused treatment including MRIs-he was given 1 dose of dalbavancin and discharged home.  Patient subsequently presented to [facility] on 3/21 with back pain and subsequently admitted to the hospitalist service.  Significant events: 3/8-3/21>> hospitalization at [facility]-MRSA bacteremia-endocarditis refused MRI/not cooperative-discharged after getting IV dalbavancin. 3/21>> admit to [facility] for low back pain.  Significant studies: 3/21>> CT abdomen/pelvis: Emphysema/bronchitis changes lung bases-moderate-sized right inguinal hernia-heterogeneous nephrograms suggesting acute pyelonephritis. 3/22>> MRI LS spine: Chronic compression fractures of T12, L2, L3 and L5. 3/24>> MRI brain: No acute infarct 3/24>> MRI C-spine: Marrow edema surrounding left C2-C3 facet joint-involving posterior C2/C3 bodies with surrounding paraspinal edema-concerning for septic arthritis/discitis/osteomyelitis. 3/24>> echo: EF 20-25%, severe global hypokinesis. 3/25>> MRI right hip: S/p total right hip arthroplasty-complex fluid inferior to the right femoral neck prosthesis-fluid extends into the right obturator externus muscle into the right iliopsoas bursa.   Significant microbiology data: 3/21>> blood cultures: No growth 4/02>> synovial fluid right hip: MRSA.  Procedures: 3/24>> PICC line 4/02>> CT-guided fluid aspiration from right hip.  Consults: Cardiology Orthopedics Palliative care IR Infectious disease  Subjective: No  complaints-except for some constipation.  Objective: Vitals: Blood pressure 113/73, pulse 77, temperature 98.5 F (36.9 C), temperature source Oral, resp. rate 19, height 6\' 1"  (1.854 m), weight 57.4 kg, SpO2 97%.   Exam: Gen Exam:Alert awake-not in any distress HEENT:atraumatic, normocephalic Chest: B/L clear to auscultation anteriorly CVS:S1S2 regular Abdomen:soft non tender, non distended Extremities:no edema Neurology: Non focal Skin: no rash  Pertinent Labs/Radiology:    Latest Ref Rng & Units 12/21/2023    3:16 AM 12/20/2023    3:59 AM 12/17/2023    3:16 AM  CBC  WBC 4.0 - 10.5 K/uL 8.4  7.7  7.8   Hemoglobin 13.0 - 17.0 g/dL 9.0  9.3  9.7   Hematocrit 39.0 - 52.0 % 27.4  28.3  29.4   Platelets 150 - 400 K/uL 264  236  289     Lab Results  Component Value Date   NA 128 (L) 12/26/2023   K 4.1 12/26/2023   CL 93 (L) 12/26/2023   CO2 26 12/26/2023      Assessment/Plan: 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 Evaluated by ID-IV vancomycin-stop date 5/2 Evaluated by neurosurgery-Dr. Cleda Daub treatment for C2-C3 osteomyelitis/discitis Evaluated by cardiology-TEE deferred due to his medical issues/esophageal stricture. Evaluated by orthopedics-given right hip prosthetic infection-recommendations are for reconstructive surgery/versus disarticulation-at a tertiary center.  Dr. Randol Kern discussed with patient-he would like to avoid surgery currently-and would like to continue conservative measures for now-and follow-up with his primary surgeon at [facility]. Given history of drug use-noncompliance-not a candidate for outpatient IV antibiotic therapy-unfortunately will have to remain inpatient until 5/2.  T12, L2, L3 and L5 compression fractures Supportive  care.  Chronic  HFrEF Euvolemic. 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  Chronic hepatitis C infection Outpatient management  Polysubstance abuse/IVDA Per prior notes-recent drug screen at [facility] positive for amphetamine/cocaine-February 2025 Counseled extensively  Tobacco abuse Transdermal nicotine Counseled extensively  Incidental finding of right inguinal hernia on CT imaging No abdominal pain-no signs of obstruction/incarceration  Nutrition Status: Nutrition Problem: Severe Malnutrition Etiology: chronic illness (COPD, Hep C, hx IV drug use) Signs/Symptoms: severe fat depletion, severe muscle depletion Interventions: Ensure Enlive (each supplement provides 350kcal and 20 grams of protein), MVI, Magic cup, Liberalize Diet, Refer to RD note for recommendations  Underweight: Estimated body mass index is 16.7 kg/m as calculated from the following:   Height as of this encounter: 6\' 1"  (1.854 m).   Weight as of this encounter: 57.4 kg.   Code status:   Code Status: Full Code   DVT Prophylaxis: apixaban (ELIQUIS) tablet 5 mg    Family Communication:  None at bedside  Disposition Plan: Status is: Inpatient Remains inpatient appropriate because: Severity of illness   Planned Discharge Destination:Home   Diet: Diet Order             Diet regular Room service appropriate? Yes; Fluid consistency: Thin; Fluid restriction: 1200 mL Fluid  Diet effective now                     Antimicrobial agents: Anti-infectives (From admission, onward)    Start     Dose/Rate Route Frequency Ordered Stop   12/25/23 1000  vancomycin (VANCOREADY) IVPB 1250 mg/250 mL        1,250 mg 166.7 mL/hr over 90 Minutes Intravenous Every 24 hours 12/24/23 1705     12/19/23  1200  vancomycin (VANCOCIN) IVPB 1000 mg/200 mL premix  Status:  Discontinued        1,000 mg 200 mL/hr over 60 Minutes Intravenous Every 24 hours 12/18/23 1251 12/24/23 1705   12/13/23 1200  vancomycin (VANCOREADY) IVPB 1250 mg/250 mL  Status:  Discontinued        1,250 mg 166.7 mL/hr over 90 Minutes Intravenous Every 24 hours 12/13/23 0729 12/18/23 1251   12/11/23 1200  vancomycin (VANCOCIN) IVPB 1000 mg/200 mL premix  Status:  Discontinued        1,000 mg 200 mL/hr over 60 Minutes Intravenous Every 24 hours 12/11/23 0805 12/13/23 0729   12/09/23 1200  vancomycin (VANCOREADY) IVPB 1250 mg/250 mL  Status:  Discontinued        1,250 mg 166.7 mL/hr over 90 Minutes Intravenous Every 24 hours 12/09/23 1101 12/11/23 0805   12/08/23 1245  linezolid (ZYVOX) tablet 600 mg  Status:  Discontinued        600 mg Oral Every 12 hours 12/08/23 1159 12/09/23 1056   12/08/23 1000  cefTRIAXone (ROCEPHIN) 1 g in sodium chloride 0.9 % 100 mL IVPB  Status:  Discontinued        1 g 200 mL/hr over 30 Minutes Intravenous Every 24 hours 12/08/23 0912 12/09/23 1001   12/08/23 0600  vancomycin (VANCOREADY) IVPB 1250 mg/250 mL  Status:  Discontinued        1,250 mg 166.7 mL/hr over 90 Minutes Intravenous Every 24 hours 12/07/23 1107 12/08/23 1159   12/07/23 0600  vancomycin (VANCOCIN) IVPB 1000 mg/200 mL premix  Status:  Discontinued  1,000 mg 200 mL/hr over 60 Minutes Intravenous Every 24 hours 12/07/23 0105 12/07/23 1107        MEDICATIONS: Scheduled Meds:  apixaban  5 mg Oral BID   atorvastatin  80 mg Oral QHS   carvedilol  3.125 mg Oral BID WC   Chlorhexidine Gluconate Cloth  6 each Topical Daily   docusate sodium  100 mg Oral BID   famotidine  20 mg Oral Daily   feeding supplement  237 mL Oral TID BM   furosemide  40 mg Oral Daily   hydrocerin   Topical BID   multivitamin with minerals  1 tablet Oral Daily   saccharomyces boulardii  250 mg Oral BID   spironolactone  25 mg Oral Daily    Continuous Infusions:  vancomycin 1,250 mg (12/25/23 1259)   PRN Meds:.acetaminophen, camphor-menthol, diphenhydrAMINE, HYDROmorphone (DILAUDID) injection, melatonin, naLOXone (NARCAN)  injection, oxyCODONE, polyethylene glycol, prochlorperazine, sodium chloride flush   I have personally reviewed following labs and imaging studies  LABORATORY DATA: CBC: Recent Labs  Lab 12/20/23 0359 12/21/23 0316  WBC 7.7 8.4  HGB 9.3* 9.0*  HCT 28.3* 27.4*  MCV 86.5 87.0  PLT 236 264    Basic Metabolic Panel: Recent Labs  Lab 12/20/23 0359 12/21/23 0316 12/23/23 0214 12/25/23 0751 12/26/23 0303  NA 131* 129* 128* 126* 128*  K 4.2 4.0 4.5 4.1 4.1  CL 98 96* 93* 90* 93*  CO2 24 24 26 27 26   GLUCOSE 97 110* 93 94 94  BUN 17 19 25* 23 30*  CREATININE 0.73 0.86 0.91 0.81 0.79  CALCIUM 8.4* 8.7* 8.8* 8.8* 9.0    GFR: Estimated Creatinine Clearance: 76.7 mL/min (by C-G formula based on SCr of 0.79 mg/dL).  Liver Function Tests: No results for input(s): "AST", "ALT", "ALKPHOS", "BILITOT", "PROT", "ALBUMIN" in the last 168 hours. No results for input(s): "LIPASE", "AMYLASE" in the last 168 hours. No results for input(s): "AMMONIA" in the last 168 hours.  Coagulation Profile: No results for input(s): "INR", "PROTIME" in the last 168 hours.  Cardiac Enzymes: No results for input(s): "CKTOTAL", "CKMB", "CKMBINDEX", "TROPONINI" in the last 168 hours.  BNP (last 3 results) No results for input(s): "PROBNP" in the last 8760 hours.  Lipid Profile: No results for input(s): "CHOL", "HDL", "LDLCALC", "TRIG", "CHOLHDL", "LDLDIRECT" in the last 72 hours.  Thyroid Function Tests: No results for input(s): "TSH", "T4TOTAL", "FREET4", "T3FREE", "THYROIDAB" in the last 72 hours.  Anemia Panel: No results for input(s): "VITAMINB12", "FOLATE", "FERRITIN", "TIBC", "IRON", "RETICCTPCT" in the last 72 hours.  Urine analysis:    Component Value Date/Time   COLORURINE YELLOW 12/08/2023 0926    APPEARANCEUR CLEAR 12/08/2023 0926   LABSPEC 1.010 12/08/2023 0926   PHURINE 6.0 12/08/2023 0926   GLUCOSEU NEGATIVE 12/08/2023 0926   HGBUR NEGATIVE 12/08/2023 0926   BILIRUBINUR NEGATIVE 12/08/2023 0926   KETONESUR NEGATIVE 12/08/2023 0926   PROTEINUR NEGATIVE 12/08/2023 0926   UROBILINOGEN 1.0 06/22/2008 1330   NITRITE NEGATIVE 12/08/2023 0926   LEUKOCYTESUR NEGATIVE 12/08/2023 0926    Sepsis Labs: Lactic Acid, Venous    Component Value Date/Time   LATICACIDVEN 1.6 12/07/2023 1500    MICROBIOLOGY: Recent Results (from the past 240 hours)  Aerobic/Anaerobic Culture w Gram Stain (surgical/deep wound)     Status: None   Collection Time: 12/18/23  1:15 PM   Specimen: Joint, Right Hip; Body Fluid  Result Value Ref Range Status   Specimen Description JOINT FLUID RIGHT HIP  Final   Special Requests NONE  Final   Gram Stain   Final    ABUNDANT WBC PRESENT, PREDOMINANTLY PMN NO ORGANISMS SEEN    Culture   Final    RARE METHICILLIN RESISTANT STAPHYLOCOCCUS AUREUS NO ANAEROBES ISOLATED Performed at [facility], [address], [place], [place] [address]    Report Status 12/23/2023 FINAL  Final   Organism ID, Bacteria METHICILLIN RESISTANT STAPHYLOCOCCUS AUREUS  Final      Susceptibility   Methicillin resistant staphylococcus aureus - MIC*    CIPROFLOXACIN <=0.5 SENSITIVE Sensitive     ERYTHROMYCIN >=8 RESISTANT Resistant     GENTAMICIN <=0.5 SENSITIVE Sensitive     OXACILLIN >=4 RESISTANT Resistant     TETRACYCLINE >=16 RESISTANT Resistant     VANCOMYCIN 1 SENSITIVE Sensitive     TRIMETH/SULFA <=10 SENSITIVE Sensitive     CLINDAMYCIN >=8 RESISTANT Resistant     RIFAMPIN <=0.5 SENSITIVE Sensitive     Inducible Clindamycin NEGATIVE Sensitive     LINEZOLID 2 SENSITIVE Sensitive     * RARE METHICILLIN RESISTANT STAPHYLOCOCCUS AUREUS    RADIOLOGY STUDIES/RESULTS: No results found.   LOS: 19 days   Jeoffrey Massed, MD  [facility]    To contact the  attending provider between 7A-7P or the covering provider during after hours 7P-7A, please log into the web site [email or web address] and access using universal [place] password for that web site. If you do not have the password, please call the hospital operator.  12/26/2023, 10:17 AM

## 2023-12-26 NOTE — Plan of Care (Signed)
 Progressing

## 2023-12-26 NOTE — Plan of Care (Signed)

## 2023-12-27 DIAGNOSIS — B9562 Methicillin resistant Staphylococcus aureus infection as the cause of diseases classified elsewhere: Secondary | ICD-10-CM | POA: Diagnosis not present

## 2023-12-27 DIAGNOSIS — R7881 Bacteremia: Secondary | ICD-10-CM | POA: Diagnosis not present

## 2023-12-27 LAB — BASIC METABOLIC PANEL WITH GFR
Anion gap: 10 (ref 5–15)
BUN: 35 mg/dL — ABNORMAL HIGH (ref 8–23)
CO2: 27 mmol/L (ref 22–32)
Calcium: 8.9 mg/dL (ref 8.9–10.3)
Chloride: 91 mmol/L — ABNORMAL LOW (ref 98–111)
Creatinine, Ser: 0.89 mg/dL (ref 0.61–1.24)
GFR, Estimated: >60 mL/min (ref >60)
Glucose, Bld: 111 mg/dL — ABNORMAL HIGH (ref 70–99)
Potassium: 4.3 mmol/L (ref 3.5–5.1)
Sodium: 128 mmol/L — ABNORMAL LOW (ref 135–145)

## 2023-12-27 LAB — VANCOMYCIN, PEAK: Vancomycin Pk: 45 ug/mL — ABNORMAL HIGH (ref 30–40)

## 2023-12-27 NOTE — Progress Notes (Signed)
 PROGRESS NOTE    [name] [name]  BJY:[id number] DOB: 10/29/1960 DOA: 12/06/2023 PCP: Patient, No Pcp Per   Brief Narrative: [name] [name] is a 63 y.o. male with a history of chronic HFrEF, VT on Eliquis, IV drug use, COPD, chronic HCV, MRSA bacteremia and endocarditis.  Patient was recently admitted to [facility] for his MRSA bacteremia with endocarditis and per report, patient was uncooperative during the hospitalization with and was refusing workup/treatments; patient was given 1 dose of dalbavancin prior to discharge and presenting at [facility] for evaluation and management of his infection.  Patient found to have multiple thoracic/lumbar compression fractures in addition to C-spine osteomyelitis/discitis.  Infectious ease was consulted with recommendations for antibiotics and patient is currently managed on vancomycin IV.  No surgical management for osteomyelitis/discitis.  PICC line placed this admission for prolonged IV antibiotics.   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 Neurosurgery consulted for spine osteomyelitis/discitis with recommendation for medical management. Transthoracic Echocardiogram deferred secondary to history of esophageal stricture and overall medical concerns, although Transthoracic Echocardiogram significant for no vegetation mentioned. Orthopedic surgery consulted secondary to prosthetic hip infection with recommendation for reconstructive surgery vs disarticulation at a tertiary center; patient previously seen at [facility]. PICC placed on 3/24 for prolonged IV antibiotics. -Continue Vancomycin IV  T12,L2, L3, L5 compression fractures -Continue pain management  Chronic HFrEF LVEF of 20-25% with global hypokinesis and grade 1 diastolic  dysfunction. Currently seems to be euvolemic.  History of VTE Noted. 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  Fluid restriction ordered and patient started on furosemide with improved sodium.  Sodium up to 128. -Continue furosemide -Recheck urine sodium and osmolality  Chronic hepatitis C infection Noted.  Outpatient follow-up with infectious disease.  Polysubstance abuse IVDU Noted.  Recent UDS significant for amphetamines and cocaine.  Patient counseled.  Patient will need PICC line removed prior to discharge from the hospital.  Tobacco use Noted.  Right inguinal hernia Noted on CT incidentally.  No evidence of obstruction or incarceration.  Severe malnutrition -Dietitian recommendations (4/7): Continue double proteins to supplement intake Continue liberalized diet to encourage adequate calorie/protein intake Continue Ensure Enlive po BID, each supplement provides 350 kcal and 20 grams of protein. Continue Magic cup TID with meals, each supplement provides 290 kcal and 9 grams of protein Continue MVI w/ minerals Continue Florastor r/t ABX administration to maintain gut microbiome Initiate bowel regimen  Underweight Estimated body mass index is 16.58 kg/m as calculated from the following:   Height as of this encounter: 6\' 1"  (1.854 m).   Weight as of this encounter: 57 kg.  DVT prophylaxis: Eliquis Code Status:   Code Status: Full Code Family Communication: None at bedside Disposition Plan: Discharge home after completion of IV antibiotics   Consultants:  Cardiology Infectious disease Orthopedic surgery Palliative care medicine Interventional radiology  Procedures:    Antimicrobials: Vancomycin IV    Subjective: Patient reports wanting diazepam. He then asked for increased pain medication for increasing pain. When asked where his pain was increasing and the need to investigate cause, he withdrew his request for escalation in pain  medication.  Objective: BP 129/75   Pulse 83   Temp 97.7 F (36.5 C)   Resp 17   Ht 6\' 1"  (1.854 m)   Wt 57 kg   SpO2  100%   BMI 16.58 kg/m   Examination:  General exam: Appears calm and comfortable Respiratory system: Clear to auscultation. Respiratory effort normal. Cardiovascular system: S1 & S2 heard, RRR. No murmurs, rubs, gallops or clicks. Gastrointestinal system: Abdomen is nondistended, soft and nontender. Normal bowel sounds heard. Central nervous system: Alert and oriented. No focal neurological deficits. Musculoskeletal: No edema. No calf tenderness. Psychiatry: Judgement and insight appear normal. Mood & affect appropriate.    Data Reviewed: I have personally reviewed following labs and imaging studies  CBC Lab Results  Component Value Date   WBC 8.4 12/21/2023   RBC 3.15 (L) 12/21/2023   HGB 9.0 (L) 12/21/2023   HCT 27.4 (L) 12/21/2023   MCV 87.0 12/21/2023   MCH 28.6 12/21/2023   PLT 264 12/21/2023   MCHC 32.8 12/21/2023   RDW 16.7 (H) 12/21/2023   LYMPHSABS 1.0 12/17/2023   MONOABS 0.7 12/17/2023   EOSABS 0.5 12/17/2023   BASOSABS 0.0 12/17/2023     Last metabolic panel Lab Results  Component Value Date   NA 128 (L) 12/27/2023   K 4.3 12/27/2023   CL 91 (L) 12/27/2023   CO2 27 12/27/2023   BUN 35 (H) 12/27/2023   CREATININE 0.89 12/27/2023   GLUCOSE 111 (H) 12/27/2023   GFRNONAA >60 12/27/2023   GFRAA >60 09/09/2018   CALCIUM 8.9 12/27/2023   PHOS 2.9 12/07/2023   PROT 7.3 12/17/2023   ALBUMIN 2.0 (L) 12/17/2023   BILITOT 0.6 12/17/2023   ALKPHOS 68 12/17/2023   AST 22 12/17/2023   ALT 18 12/17/2023   ANIONGAP 10 12/27/2023    GFR: Estimated Creatinine Clearance: 68.5 mL/min (by C-G formula based on SCr of 0.89 mg/dL).  Recent Results (from the past 240 hours)  Aerobic/Anaerobic Culture w Gram Stain (surgical/deep wound)     Status: None   Collection Time: 12/18/23  1:15 PM   Specimen: Joint, Right Hip; Body Fluid  Result  Value Ref Range Status   Specimen Description JOINT FLUID RIGHT HIP  Final   Special Requests NONE  Final   Gram Stain   Final    ABUNDANT WBC PRESENT, PREDOMINANTLY PMN NO ORGANISMS SEEN    Culture   Final    RARE METHICILLIN RESISTANT STAPHYLOCOCCUS AUREUS NO ANAEROBES ISOLATED Performed at [facility], [address] [place], [place] [address]    Report Status 12/23/2023 FINAL  Final   Organism ID, Bacteria METHICILLIN RESISTANT STAPHYLOCOCCUS AUREUS  Final      Susceptibility   Methicillin resistant staphylococcus aureus - MIC*    CIPROFLOXACIN <=0.5 SENSITIVE Sensitive     ERYTHROMYCIN >=8 RESISTANT Resistant     GENTAMICIN <=0.5 SENSITIVE Sensitive     OXACILLIN >=4 RESISTANT Resistant     TETRACYCLINE >=16 RESISTANT Resistant     VANCOMYCIN 1 SENSITIVE Sensitive     TRIMETH/SULFA <=10 SENSITIVE Sensitive     CLINDAMYCIN >=8 RESISTANT Resistant     RIFAMPIN <=0.5 SENSITIVE Sensitive     Inducible Clindamycin NEGATIVE Sensitive     LINEZOLID 2 SENSITIVE Sensitive     * RARE METHICILLIN RESISTANT STAPHYLOCOCCUS AUREUS      Radiology Studies: No results found.    LOS: 20 days    Jacquelin Hawking, MD [facility] 12/27/2023, 1:51 PM   If 7PM-7AM, please contact night-coverage [email or web address]

## 2023-12-28 DIAGNOSIS — B9562 Methicillin resistant Staphylococcus aureus infection as the cause of diseases classified elsewhere: Secondary | ICD-10-CM | POA: Diagnosis not present

## 2023-12-28 DIAGNOSIS — R7881 Bacteremia: Secondary | ICD-10-CM | POA: Diagnosis not present

## 2023-12-28 LAB — VANCOMYCIN, TROUGH: Vancomycin Tr: 15 ug/mL (ref 15–20)

## 2023-12-28 MED ORDER — VANCOMYCIN HCL IN DEXTROSE 1-5 GM/200ML-% IV SOLN: 1000.0000 mg | INTRAVENOUS | Status: DC

## 2023-12-28 MED ORDER — CARMEX CLASSIC LIP BALM EX OINT: TOPICAL_OINTMENT | CUTANEOUS | Status: DC | PRN

## 2023-12-28 MED ORDER — VANCOMYCIN HCL IN DEXTROSE 1-5 GM/200ML-% IV SOLN
1000.0000 mg | INTRAVENOUS | Status: DC
Start: 2023-12-29 — End: 2024-01-17
  Administered 2023-12-29 – 2024-01-04 (×7): 1000 mg via INTRAVENOUS
  Filled 2023-12-28 (×7): qty 200

## 2023-12-28 NOTE — Plan of Care (Signed)

## 2023-12-28 NOTE — Plan of Care (Signed)
  Problem: Education: Goal: Knowledge of General Education information will improve Description: Including pain rating scale, medication(s)/side effects and non-pharmacologic comfort measures Outcome: Progressing   Problem: Clinical Measurements: Goal: Will remain free from infection Outcome: Progressing   Problem: Activity: Goal: Risk for activity intolerance will decrease Outcome: Progressing   Problem: Clinical Measurements: Goal: Respiratory complications will improve Outcome: Progressing   Problem: Safety: Goal: Ability to remain free from injury will improve Outcome: Progressing   Problem: Pain Managment: Goal: General experience of comfort will improve and/or be controlled Outcome: Progressing

## 2023-12-28 NOTE — Progress Notes (Signed)
 PROGRESS NOTE    [name] [name]  [id number]:[id number] DOB: 1961/02/22 DOA: 12/06/2023 PCP: Patient, No Pcp Per   Brief Narrative: [name] [name] is a 63 y.o. male with a history of chronic HFrEF, VT on Eliquis, IV drug use, COPD, chronic HCV, MRSA bacteremia and endocarditis.  Patient was recently admitted to [facility] for his MRSA bacteremia with endocarditis and per report, patient was uncooperative during the hospitalization with and was refusing workup/treatments; patient was given 1 dose of dalbavancin prior to discharge and presenting at [facility] for evaluation and management of his infection.  Patient found to have multiple thoracic/lumbar compression fractures in addition to C-spine osteomyelitis/discitis.  Infectious ease was consulted with recommendations for antibiotics and patient is currently managed on vancomycin IV.  No surgical management for osteomyelitis/discitis.  PICC line placed this admission for prolonged IV antibiotics.   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 Neurosurgery consulted for spine osteomyelitis/discitis with recommendation for medical management. Transthoracic Echocardiogram deferred secondary to history of esophageal stricture and overall medical concerns, although Transthoracic Echocardiogram significant for no vegetation mentioned. Orthopedic surgery consulted secondary to prosthetic hip infection with recommendation for reconstructive surgery vs disarticulation at a tertiary center; patient previously seen at [facility]. PICC placed on 3/24 for prolonged IV antibiotics. -Continue Vancomycin IV; monitor vancomycin levels  T12,L2, L3, L5 compression fractures -Continue pain management  Chronic HFrEF LVEF of 20-25% with global hypokinesis and  grade 1 diastolic dysfunction. Currently seems to be euvolemic.  History of VTE Noted. 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  Fluid restriction ordered and patient started on furosemide with improved sodium.  Sodium up to 128. -Continue furosemide -Recheck urine sodium and osmolality  Chronic hepatitis C infection Noted.  Outpatient follow-up with infectious disease.  Polysubstance abuse IVDU Noted.  Recent UDS significant for amphetamines and cocaine.  Patient counseled.  Patient will need PICC line removed prior to discharge from the hospital.  Tobacco use Noted.  Right inguinal hernia Noted on CT incidentally.  No evidence of obstruction or incarceration.  Severe malnutrition -Dietitian recommendations (4/7): Continue double proteins to supplement intake Continue liberalized diet to encourage adequate calorie/protein intake Continue Ensure Enlive po BID, each supplement provides 350 kcal and 20 grams of protein. Continue Magic cup TID with meals, each supplement provides 290 kcal and 9 grams of protein Continue MVI w/ minerals Continue Florastor r/t ABX administration to maintain gut microbiome Initiate bowel regimen  Underweight Estimated body mass index is 16.58 kg/m as calculated from the following:   Height as of this encounter: 6\' 1"  (1.854 m).   Weight as of this encounter: 57 kg.  DVT prophylaxis: Eliquis Code Status:   Code Status: Full Code Family Communication: None at bedside Disposition Plan: Discharge home after completion of IV antibiotics   Consultants:  Cardiology Infectious disease Orthopedic surgery Palliative care medicine Interventional radiology  Procedures:    Antimicrobials: Vancomycin IV    Subjective: No concerns this morning.  Objective: BP 122/72 (BP Location: Left Arm)   Pulse 77   Temp 97.9 F (36.6 C) (Oral)   Resp 16   Ht 6\' 1"  (1.854 m)   Wt 57 kg   SpO2 99%   BMI 16.58 kg/m    Examination:  General exam: Appears calm and comfortable Respiratory system: Respiratory effort normal. Central nervous system:  Alert and oriented. Psychiatry: Judgement and insight appear normal. Mood & affect appropriate.    Data Reviewed: I have personally reviewed following labs and imaging studies  CBC Lab Results  Component Value Date   WBC 8.4 12/21/2023   RBC 3.15 (L) 12/21/2023   HGB 9.0 (L) 12/21/2023   HCT 27.4 (L) 12/21/2023   MCV 87.0 12/21/2023   MCH 28.6 12/21/2023   PLT 264 12/21/2023   MCHC 32.8 12/21/2023   RDW 16.7 (H) 12/21/2023   LYMPHSABS 1.0 12/17/2023   MONOABS 0.7 12/17/2023   EOSABS 0.5 12/17/2023   BASOSABS 0.0 12/17/2023     Last metabolic panel Lab Results  Component Value Date   NA 128 (L) 12/27/2023   K 4.3 12/27/2023   CL 91 (L) 12/27/2023   CO2 27 12/27/2023   BUN 35 (H) 12/27/2023   CREATININE 0.89 12/27/2023   GLUCOSE 111 (H) 12/27/2023   GFRNONAA >60 12/27/2023   GFRAA >60 09/09/2018   CALCIUM 8.9 12/27/2023   PHOS 2.9 12/07/2023   PROT 7.3 12/17/2023   ALBUMIN 2.0 (L) 12/17/2023   BILITOT 0.6 12/17/2023   ALKPHOS 68 12/17/2023   AST 22 12/17/2023   ALT 18 12/17/2023   ANIONGAP 10 12/27/2023    GFR: Estimated Creatinine Clearance: 68.5 mL/min (by C-G formula based on SCr of 0.89 mg/dL).  Recent Results (from the past 240 hours)  Aerobic/Anaerobic Culture w Gram Stain (surgical/deep wound)     Status: None   Collection Time: 12/18/23  1:15 PM   Specimen: Joint, Right Hip; Body Fluid  Result Value Ref Range Status   Specimen Description JOINT FLUID RIGHT HIP  Final   Special Requests NONE  Final   Gram Stain   Final    ABUNDANT WBC PRESENT, PREDOMINANTLY PMN NO ORGANISMS SEEN    Culture   Final    RARE METHICILLIN RESISTANT STAPHYLOCOCCUS AUREUS NO ANAEROBES ISOLATED Performed at [facility], [address], [place], [place] [address]    Report Status 12/23/2023 FINAL  Final   Organism ID, Bacteria  METHICILLIN RESISTANT STAPHYLOCOCCUS AUREUS  Final      Susceptibility   Methicillin resistant staphylococcus aureus - MIC*    CIPROFLOXACIN <=0.5 SENSITIVE Sensitive     ERYTHROMYCIN >=8 RESISTANT Resistant     GENTAMICIN <=0.5 SENSITIVE Sensitive     OXACILLIN >=4 RESISTANT Resistant     TETRACYCLINE >=16 RESISTANT Resistant     VANCOMYCIN 1 SENSITIVE Sensitive     TRIMETH/SULFA <=10 SENSITIVE Sensitive     CLINDAMYCIN >=8 RESISTANT Resistant     RIFAMPIN <=0.5 SENSITIVE Sensitive     Inducible Clindamycin NEGATIVE Sensitive     LINEZOLID 2 SENSITIVE Sensitive     * RARE METHICILLIN RESISTANT STAPHYLOCOCCUS AUREUS      Radiology Studies: No results found.    LOS: 21 days    Aneita Keens, MD [facility] 12/28/2023, 9:36 AM   If 7PM-7AM, please contact night-coverage [email or web address]

## 2023-12-28 NOTE — Progress Notes (Signed)
 Pharmacy Antibiotic Note  [name] [name] is a 63 y.o. male admitted on 12/06/2023 with back pain, possible osteomyelitis.  Admitted 3/8-3/21 at [facility] for MRSA bacteremia and possible endocarditis/osteomyelitis --received Vancomycin throughout admission.  Plan was for discharge 3/21 and administration of Dalvancin weekly, but dose not given.  Pharmacy has been consulted for Vancomycin  dosing.   Vanc peak = 45, Vanc trough = 15, AUC = 645 (goal 400-550)  Plan: Decrease Vancomycin  to 1g IV Q24H starting 4/13 since today's dose already given, new calculated AUC 516 Recheck levels at [facility] Planning to continue Vancomycin therapy while inpt through 5/2   Levels / Dose adjustments: Per documentation last levels at [facility] on 3/19 on 750mg  q24h with trough of 283 (trough: 7.2 and peak 16.2), adjusted regimen to 1250mg  q24h for eAUC ~480. Scr around 0.8 during this time.   Vancomycin adjusted on 12/21/23 to Vancomycin 1000mg  IV q24h  Steady state peak and trough levels checked.  Scr 0.91 Vanc 1000mg  IV q24h *4/7 vanc dose  given @1301   *4/7 VP = 37 @1500  *4/8 VT = 10 @1129  *4/11 VP = 45 @ 1546 *4/12 VT = 15 @ 1150  Height: 6\' 1"  (185.4 cm) Weight: 57 kg (125 lb 10.6 oz) IBW/kg (Calculated) : 79.9  Temp (24hrs), Avg:98 F (36.7 C), Min:97.6 F (36.4 C), Max:98.3 F (36.8 C)  Recent Labs  Lab 12/23/23 0214 12/23/23 1500 12/24/23 1129 12/25/23 0751 12/26/23 0303 12/27/23 0322 12/27/23 1546 12/28/23 1150  CREATININE 0.91  --   --  0.81 0.79 0.89  --   --   VANCOTROUGH  --   --  10*  --   --   --   --  15  VANCOPEAK  --  37  --   --   --   --  45*  --     Estimated Creatinine Clearance: 68.5 mL/min (by C-G formula based on SCr of 0.89 mg/dL).    Allergies  Allergen Reactions   Fentanyl Itching   Hydromorphone Hcl Itching    Pt  States itching all over Pt  States itching all over Pt  States itching all over    Morphine Itching   Pantoprazole Hives    Pantoprazole Sodium Hives   Haloperidol Other (See Comments)    Pt reports he has "seizures" had haldol on 09/25/21 with no apparent ill effect.  Pt reports he has "seizures"   Nsaids Nausea And Vomiting, Other (See Comments) and Hives    Severe cramping Other reaction(s): Abdominal Pain Cramping per [place]   Morphine And Codeine Itching   Ibuprofen    Ketorolac Other (See Comments)   Pantoprazole Sodium Hives   Adamariz Gillott, PharmD, BCPS 12/28/2023 1:01 PM

## 2023-12-28 NOTE — Progress Notes (Signed)
 Notify Dr Jenney Modest vis secured chat about Pt request for additional constipation meds

## 2023-12-29 DIAGNOSIS — B9562 Methicillin resistant Staphylococcus aureus infection as the cause of diseases classified elsewhere: Secondary | ICD-10-CM | POA: Diagnosis not present

## 2023-12-29 DIAGNOSIS — R7881 Bacteremia: Secondary | ICD-10-CM | POA: Diagnosis not present

## 2023-12-29 LAB — BASIC METABOLIC PANEL WITH GFR
Anion gap: 11 (ref 5–15)
BUN: 31 mg/dL — ABNORMAL HIGH (ref 8–23)
CO2: 27 mmol/L (ref 22–32)
Calcium: 9 mg/dL (ref 8.9–10.3)
Chloride: 91 mmol/L — ABNORMAL LOW (ref 98–111)
Creatinine, Ser: 0.8 mg/dL (ref 0.61–1.24)
GFR, Estimated: >60 mL/min (ref >60)
Glucose, Bld: 122 mg/dL — ABNORMAL HIGH (ref 70–99)
Potassium: 3.9 mmol/L (ref 3.5–5.1)
Sodium: 129 mmol/L — ABNORMAL LOW (ref 135–145)

## 2023-12-29 LAB — OSMOLALITY, URINE: Osmolality, Ur: 373 mosm/kg (ref 300–900)

## 2023-12-29 LAB — SODIUM, URINE, RANDOM: Sodium, Ur: 71 mmol/L

## 2023-12-29 MED ORDER — HYDROMORPHONE HCL 1 MG/ML IJ SOLN
0.5000 mg | INTRAMUSCULAR | Status: DC | PRN
  Administered 2023-12-30 – 2024-01-02 (×12): 0.5 mg via INTRAVENOUS
  Filled 2023-12-29 (×12): qty 0.5

## 2023-12-29 MED ORDER — LACTULOSE 10 GM/15ML PO SOLN
20.0000 g | Freq: Every day | ORAL | Status: DC
  Administered 2023-12-29 – 2024-01-04 (×7): 20 g via ORAL
  Filled 2023-12-29 (×8): qty 30

## 2023-12-29 MED ORDER — SORBITOL 70 % SOLN: 30.0000 mL | Freq: Every day | Status: DC | PRN

## 2023-12-29 NOTE — Progress Notes (Signed)
 PROGRESS NOTE    [name] [name]  QMV:[id number] DOB: 07-17-1961 DOA: 12/06/2023 PCP: Patient, No Pcp Per   Brief Narrative: [name] [name] is a 63 y.o. male with a history of chronic HFrEF, VT on Eliquis, IV drug use, COPD, chronic HCV, MRSA bacteremia and endocarditis.  Patient was recently admitted to [facility] for his MRSA bacteremia with endocarditis and per report, patient was uncooperative during the hospitalization with and was refusing workup/treatments; patient was given 1 dose of dalbavancin prior to discharge and presenting at [facility] for evaluation and management of his infection.  Patient found to have multiple thoracic/lumbar compression fractures in addition to C-spine osteomyelitis/discitis.  Infectious ease was consulted with recommendations for antibiotics and patient is currently managed on vancomycin IV.  No surgical management for osteomyelitis/discitis.  PICC line placed this admission for prolonged IV antibiotics.   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 Neurosurgery consulted for spine osteomyelitis/discitis with recommendation for medical management. Transthoracic Echocardiogram deferred secondary to history of esophageal stricture and overall medical concerns, although Transthoracic Echocardiogram significant for no vegetation mentioned. Orthopedic surgery consulted secondary to prosthetic hip infection with recommendation for reconstructive surgery vs disarticulation at a tertiary center; patient previously seen at [facility]. PICC placed on 3/24 for prolonged IV antibiotics. -Continue Vancomycin IV; monitor vancomycin levels  T12,L2, L3, L5 compression fractures -Continue pain management  Chronic HFrEF LVEF of 20-25% with global hypokinesis and  grade 1 diastolic dysfunction. Currently seems to be euvolemic.  History of VTE Noted. 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  Fluid restriction ordered and patient started on furosemide with improved sodium.  Sodium up to 128. -Continue furosemide -Recheck urine sodium and osmolality -BMP pending  Chronic hepatitis C infection Noted.  Outpatient follow-up with infectious disease.  Polysubstance abuse IVDU Noted.  Recent UDS significant for amphetamines and cocaine.  Patient counseled.  Patient will need PICC line removed prior to discharge from the hospital.  Tobacco use Noted.  Right inguinal hernia Noted on CT incidentally.  No evidence of obstruction or incarceration.  Severe malnutrition -Dietitian recommendations (4/7): Continue double proteins to supplement intake Continue liberalized diet to encourage adequate calorie/protein intake Continue Ensure Enlive po BID, each supplement provides 350 kcal and 20 grams of protein. Continue Magic cup TID with meals, each supplement provides 290 kcal and 9 grams of protein Continue MVI w/ minerals Continue Florastor r/t ABX administration to maintain gut microbiome Initiate bowel regimen  Underweight Estimated body mass index is 17.07 kg/m as calculated from the following:   Height as of this encounter: 6\' 1"  (1.854 m).   Weight as of this encounter: 58.7 kg.  DVT prophylaxis: Eliquis Code Status:   Code Status: Full Code Family Communication: None at bedside Disposition Plan: Discharge home after completion of IV antibiotics   Consultants:  Cardiology Infectious disease Orthopedic surgery Palliative care medicine Interventional radiology  Procedures:    Antimicrobials: Vancomycin IV    Subjective: Patient reports abdominal pain from constipation.   Objective: BP 116/80   Pulse 76   Temp 98.5 F (36.9 C) (Oral)   Resp 18   Ht 6\' 1"  (1.854 m)   Wt 58.7 kg   SpO2 98%   BMI 17.07  kg/m   Examination:  General exam: Appears calm and comfortable Respiratory system: Respiratory effort normal. Gastrointestinal system:  Abdomen is nondistended, soft and nontender. Normal bowel sounds heard. Central nervous system: Alert and oriented. Psychiatry: Judgement and insight appear normal. Mood & affect appropriate.    Data Reviewed: I have personally reviewed following labs and imaging studies  CBC Lab Results  Component Value Date   WBC 8.4 12/21/2023   RBC 3.15 (L) 12/21/2023   HGB 9.0 (L) 12/21/2023   HCT 27.4 (L) 12/21/2023   MCV 87.0 12/21/2023   MCH 28.6 12/21/2023   PLT 264 12/21/2023   MCHC 32.8 12/21/2023   RDW 16.7 (H) 12/21/2023   LYMPHSABS 1.0 12/17/2023   MONOABS 0.7 12/17/2023   EOSABS 0.5 12/17/2023   BASOSABS 0.0 12/17/2023     Last metabolic panel Lab Results  Component Value Date   NA 128 (L) 12/27/2023   K 4.3 12/27/2023   CL 91 (L) 12/27/2023   CO2 27 12/27/2023   BUN 35 (H) 12/27/2023   CREATININE 0.89 12/27/2023   GLUCOSE 111 (H) 12/27/2023   GFRNONAA >60 12/27/2023   GFRAA >60 09/09/2018   CALCIUM 8.9 12/27/2023   PHOS 2.9 12/07/2023   PROT 7.3 12/17/2023   ALBUMIN 2.0 (L) 12/17/2023   BILITOT 0.6 12/17/2023   ALKPHOS 68 12/17/2023   AST 22 12/17/2023   ALT 18 12/17/2023   ANIONGAP 10 12/27/2023    GFR: Estimated Creatinine Clearance: 70.5 mL/min (by C-G formula based on SCr of 0.89 mg/dL).  No results found for this or any previous visit (from the past 240 hours).     Radiology Studies: No results found.    LOS: 22 days    Aneita Keens, MD [facility] 12/29/2023, 1:17 PM   If 7PM-7AM, please contact night-coverage [email or web address]

## 2023-12-29 NOTE — Progress Notes (Signed)
 Pt called administrator to the room and expressed that he was not happy with the changes made by the MD on his pain meds. Pt still has prn oxycodone, which has been administered around 1615hrs ,and dilaudid but per primary nurse, pt can only have the dilaudid for 10/10 pain as instructed by MD.

## 2023-12-29 NOTE — Progress Notes (Signed)
Urine collected per order and sent to lab.

## 2023-12-29 NOTE — Plan of Care (Signed)
  Problem: Clinical Measurements: Goal: Cardiovascular complication will be avoided Outcome: Progressing   Problem: Activity: Goal: Risk for activity intolerance will decrease Outcome: Progressing   Problem: Coping: Goal: Level of anxiety will decrease Outcome: Progressing   Problem: Elimination: Goal: Will not experience complications related to bowel motility Outcome: Progressing   Problem: Pain Managment: Goal: General experience of comfort will improve and/or be controlled Outcome: Progressing

## 2023-12-29 NOTE — Progress Notes (Addendum)
 430 am-Pt is fixated on not having a BM the past day-previous shift he reported being regular, denied constipation/diarrhea. This shift he agreed to a suppository, after quite a bit of education, explanation-etc.    430 I informed pt   It appears the wound on his upper vertebral column has drainage that I did not notice yesterday. When I inquired as to the changes and history of the wound he repeatedly stated "It's not my concern, I can't see it"-etc. I placed a new foam mepilex. The Coccyx and sacrum are intact, pink and blanchable. Pt refuses turns, bathing, oral care and even PO pain meds-prefers the IV analgesic.  630 pt has large formed ball of stool wrapped in sheet and is yelling "come help me so this sh*t doesn't get in my wounds", It appears pt digitally disimpacted himself.   650 am- placed WOC consult- sent provider secure chat, ie noted wound on upper vertebrae w drainage, change from previous shift. Pt states he scratched it in his sleep. There was small amount or serosanguineous drainage on sheet, mepilex was not in place. I cleaned it, placed mepilex, changed sheet, attempted to educate on not scratching and leaving mepilex in place but pt refused teaching. I placed wound consult and policy is to notify you.

## 2023-12-30 DIAGNOSIS — R7881 Bacteremia: Secondary | ICD-10-CM | POA: Diagnosis not present

## 2023-12-30 DIAGNOSIS — B9562 Methicillin resistant Staphylococcus aureus infection as the cause of diseases classified elsewhere: Secondary | ICD-10-CM | POA: Diagnosis not present

## 2023-12-30 NOTE — Progress Notes (Signed)
 PROGRESS NOTE    [name] [name]  WUJ:[id number] DOB: 18-Jun-1961 DOA: 12/06/2023 PCP: Patient, No Pcp Per   Brief Narrative: [name] [name] is a 63 y.o. male with a history of chronic HFrEF, VT on Eliquis, IV drug use, COPD, chronic HCV, MRSA bacteremia and endocarditis.  Patient was recently admitted to [facility] for his MRSA bacteremia with endocarditis and per report, patient was uncooperative during the hospitalization with and was refusing workup/treatments; patient was given 1 dose of dalbavancin prior to discharge and presenting at [facility] for evaluation and management of his infection.  Patient found to have multiple thoracic/lumbar compression fractures in addition to C-spine osteomyelitis/discitis.  Infectious ease was consulted with recommendations for antibiotics and patient is currently managed on vancomycin IV.  No surgical management for osteomyelitis/discitis.  PICC line placed this admission for prolonged IV antibiotics.   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 Neurosurgery consulted for spine osteomyelitis/discitis with recommendation for medical management. Transthoracic Echocardiogram deferred secondary to history of esophageal stricture and overall medical concerns, although Transthoracic Echocardiogram significant for no vegetation mentioned. Orthopedic surgery consulted secondary to prosthetic hip infection with recommendation for reconstructive surgery vs disarticulation at a tertiary center; patient previously seen at [facility]. PICC placed on 3/24 for prolonged IV antibiotics. -Continue Vancomycin IV; monitor vancomycin levels  T12,L2, L3, L5 compression fractures -Continue pain management  Chronic HFrEF LVEF of 20-25% with global hypokinesis and  grade 1 diastolic dysfunction. Currently seems to be euvolemic.  History of VTE Noted. 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  Fluid restriction ordered and patient started on furosemide with improved sodium.  Sodium up to 128. -Continue furosemide -Recheck urine sodium and osmolality -BMP pending  Chronic hepatitis C infection Noted.  Outpatient follow-up with infectious disease.  Polysubstance abuse IVDU Noted.  Recent UDS significant for amphetamines and cocaine.  Patient counseled.  Patient will need PICC line removed prior to discharge from the hospital.  Tobacco use Noted.  Right inguinal hernia Noted on CT incidentally.  No evidence of obstruction or incarceration.  Severe malnutrition -Dietitian recommendations (4/7): Continue double proteins to supplement intake Continue liberalized diet to encourage adequate calorie/protein intake Continue Ensure Enlive po BID, each supplement provides 350 kcal and 20 grams of protein. Continue Magic cup TID with meals, each supplement provides 290 kcal and 9 grams of protein Continue MVI w/ minerals Continue Florastor r/t ABX administration to maintain gut microbiome Initiate bowel regimen  Underweight Estimated body mass index is 16.2 kg/m as calculated from the following:   Height as of this encounter: 6\' 1"  (1.854 m).   Weight as of this encounter: 55.7 kg.  DVT prophylaxis: Eliquis Code Status:   Code Status: Full Code Family Communication: None at bedside Disposition Plan: Discharge home after completion of IV antibiotics   Consultants:  Cardiology Infectious disease Orthopedic surgery Palliative care medicine Interventional radiology  Procedures:    Antimicrobials: Vancomycin IV    Subjective: No specific concerns except pain.  Objective: BP 124/79 (BP Location: Left Arm)   Pulse 85   Temp 98.1 F (36.7 C)   Resp 17   Ht 6\' 1"  (1.854 m)   Wt 55.7 kg   SpO2 98%   BMI 16.20  kg/m   Examination:  General exam: Appears calm and comfortable  Respiratory system: Respiratory effort normal.  Central nervous system: Alert and oriented. Psychiatry: Judgement and insight appear normal. Mood & affect appropriate.    Data Reviewed: I have personally reviewed following labs and imaging studies  CBC Lab Results  Component Value Date   WBC 8.4 12/21/2023   RBC 3.15 (L) 12/21/2023   HGB 9.0 (L) 12/21/2023   HCT 27.4 (L) 12/21/2023   MCV 87.0 12/21/2023   MCH 28.6 12/21/2023   PLT 264 12/21/2023   MCHC 32.8 12/21/2023   RDW 16.7 (H) 12/21/2023   LYMPHSABS 1.0 12/17/2023   MONOABS 0.7 12/17/2023   EOSABS 0.5 12/17/2023   BASOSABS 0.0 12/17/2023     Last metabolic panel Lab Results  Component Value Date   NA 129 (L) 12/29/2023   K 3.9 12/29/2023   CL 91 (L) 12/29/2023   CO2 27 12/29/2023   BUN 31 (H) 12/29/2023   CREATININE 0.80 12/29/2023   GLUCOSE 122 (H) 12/29/2023   GFRNONAA >60 12/29/2023   GFRAA >60 09/09/2018   CALCIUM 9.0 12/29/2023   PHOS 2.9 12/07/2023   PROT 7.3 12/17/2023   ALBUMIN 2.0 (L) 12/17/2023   BILITOT 0.6 12/17/2023   ALKPHOS 68 12/17/2023   AST 22 12/17/2023   ALT 18 12/17/2023   ANIONGAP 11 12/29/2023    GFR: Estimated Creatinine Clearance: 74.5 mL/min (by C-G formula based on SCr of 0.8 mg/dL).  No results found for this or any previous visit (from the past 240 hours).     Radiology Studies: No results found.    LOS: 23 days    Aneita Keens, MD [facility] 12/30/2023, 12:34 PM   If 7PM-7AM, please contact night-coverage [email or web address]

## 2023-12-30 NOTE — Consult Note (Signed)
 WOC Nurse Consult Note: WOC consult was previously performed on 3/24 for sacrum.  Requested today to assess middle back.  Pt has a full thickness wound to the middle spine area; he does not recall the etiology of the wound but states "I scratch a lot and then it was bleeding." He is thin with protruding spine bone and dry skin.  Middle spine area with full thickness wound, 3X1.5X.2cm, red and moist with generalized erythremia surrounding, small amt yellow drainage.  Dressing procedure/placement/frequency: Topical treatment orders provided for bedside nurses to perform as follows to protect from further injury: Foam dressing to middle back, change Q 3 days or PRN soiling. Please re-consult if further assistance is needed.  Thank-you,  Wiliam Harder MSN, RN, CWOCN, [place], CNS [phone]

## 2023-12-30 NOTE — Progress Notes (Signed)
 Daily Progress Note   Patient Name: Loretta Kluender       Date: 12/30/2023 DOB: March 21, 1961  Age: 63 y.o. MRN#: [id number] Attending Physician: Verlyn Goad, MD Primary Care Physician: Patient, No Pcp Per Admit Date: 12/06/2023 Length of Stay: 23 days  Reason for Consultation/Follow-up: Establishing goals of care  HPI/Patient Profile:  63 y.o. 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  He was admitted on 12/06/2023 with MRSA bacteremia, C2-C3 osteomyelitis, right hip prosthetic joint infection, previous suspicion of tricuspid valve endocarditis, and others.    Palliative medicine was consulted for GOC conversations.  Subjective:   Subjective: Chart Reviewed. Updates received. Patient Assessed. Created space and opportunity for patient  and family to explore thoughts and feelings regarding current medical situation.  Today's Discussion: Before seeing the patient at the bedside I spent time reviewing the chart.  Patient overall has been stable, remains on antibiotics for MRSA culture of hip previously done in IR, ID on board with recommendations.  Orthopedics reevaluated and indicated surgery would likely be extensive and need pelvic reconstruction versus hip disarticulation which would have to be offered at a tertiary center.  This was previously discussed with the patient by myself and he had not made a decision on whether he would want surgery yet.  I saw the patient at bedside and he was awake, greeted me and made/kept eye contact during our conversation.  He was very pleasant during my visit today.  I shared that I was coming to check on him as previously  indicated.  We spent time talking about several topics.  His constipation is significantly improved, although he does still feel mildly constipated.  Magnesium citrate last week helped.  He relayed an incident to me when he tried to get up out of bed, nursing staff came to assist him.  He got out of bed quickly because he needed to use the bathroom and he was glad staff was there because he became lightheaded and almost fell.  After that we talked about needing therapy such as PT/OT.  He previously declined multiple times and have signed off.  I described his difficulty in getting up is another indication that he needs to start working with therapy to help him mobilize better.  We described risks including infection and blood clots from staying in the bed.  He tells me that "I am actually very active in the bed, I am moving a lot.  Also you do not know my metabolism."  I again recommended agreeing to work with therapy as this would also help his constipation and bowel movements.  He continued to decline stating he does not need therapy.  I shared that I would continue to bring it up because I have an interest in helping him get better, which he agreed.  We discussed possible orthopedic surgery and he states that he continues to think about it but he has not made a decision yet.  I encouraged him to continue thinking about whether he would be agreeable to surgery at a tertiary care center.  I confirmed I would come to see him again next Monday and likely check in weekly for the duration of his stay unless  we are needed for more intensive discussions and he is in agreement with this.  I provided emotional and general support through therapeutic listening, empathy, sharing of stories, and other techniques. I answered all questions and addressed all concerns to the best of my ability.  Review of Systems  Respiratory:  Negative for shortness of breath.   Cardiovascular:  Negative for chest pain.   Gastrointestinal:  Negative for abdominal pain, constipation, nausea and vomiting.    Objective:   Vital Signs:  BP 124/79 (BP Location: Left Arm)   Pulse 85   Temp 98.1 F (36.7 C)   Resp 17   Ht 6\' 1"  (1.854 m)   Wt 55.7 kg   SpO2 98%   BMI 16.20 kg/m   Physical Exam Vitals and nursing note reviewed.  HENT:     Head: Normocephalic and atraumatic.  Cardiovascular:     Rate and Rhythm: Normal rate.  Pulmonary:     Effort: Pulmonary effort is normal. No respiratory distress.  Abdominal:     General: Abdomen is flat. There is no distension.     Palpations: Abdomen is soft.  Skin:    General: Skin is warm and dry.  Neurological:     General: No focal deficit present.     Mental Status: He is alert and oriented to person, place, and time.  Psychiatric:        Mood and Affect: Mood normal.        Behavior: Behavior normal.     Palliative Assessment/Data: 40%    Existing Vynca/ACP Documentation: None  Assessment & Plan:   Impression: Present on Admission:  MRSA bacteremia  63 year old male with acute presentation chronic comorbidities as described above. The patient has a significant infection likely from hip joint infection. He refused attempted IR aspiration. After our discussion today he was agreeable to reattempt IR aspiration which found MRSA abscess.  Orthopedics seems to think he will need more extensive pelvic reconstruction for surgical repair which would need to be completed at a tertiary care center.  The patient is thinking on his options and has not made a decision yet.  Overall prognosis guarded to poor pending participation and the medical plan.   SUMMARY OF RECOMMENDATIONS   Full code Full scope of care Continued time for the patient to think about his options and make a decision on surgery Palliative medicine will follow-up on Monday 12/30/23 Continue to push discussions on allowing PT/OT to work with him Please notify us of any significant  clinical change or new palliative needs in the interim  Symptom Management:  Per primary team PMT is available to assist as needed  Code Status: Full code  Prognosis: Unable to determine  Discharge Planning: To Be Determined  Discussed with: Patient, medical team, nursing team  Thank you for allowing [place] to participate in the care of Gustin Zobrist PMT will continue to support holistically.  Time Total: 36 min  Detailed review of medical records (labs, imaging, vital signs), medically appropriate exam, discussed with treatment team, counseling and education to patient, family, & staff, documenting clinical information, medication management, coordination of care  Wynne Dust, NP Palliative Medicine Team  Team Phone # [phone] (Nights/Weekends)  05/16/2021, 8:17 AM

## 2023-12-30 NOTE — TOC Progression Note (Signed)
 Transition of Care [facility]) - Progression Note    Patient Details  Name: [name] [name] MRN: [id number] Date of Birth: 21-Oct-1960  [facility] CM/SW Contact  Elspeth Hals, LCSW Phone Number: 12/30/2023, 1:17 PM  Clinical Narrative:  No SNF bed offers.  TOC continuing to follow.        Barriers to Discharge: Continued Medical Work up, Inadequate or no insurance, SNF Pending bed offer, Active Substance Use - Placement  Expected Discharge Plan and Services In-house Referral: Clinical Social Work   Post Acute Care Choice: Skilled Nursing Facility Living arrangements for the past 2 months: Homeless, Hotel/Motel                                       Social Determinants of Health (SDOH) Interventions SDOH Screenings   Food Insecurity: Medium Risk (11/24/2023)   Received from [facility]  Housing: High Risk (11/24/2023)   Received from [facility]  Transportation Needs: Unmet Transportation Needs (11/24/2023)   Received from [facility]  Utilities: Unknown (11/24/2023)   Received from [facility]  Recent Concern: Utilities - High Risk (11/04/2023)   Received from [facility]  Financial Resource Strain: Low Risk  (06/22/2022)   Received from [facility]  Social Connections: Unknown (01/26/2022)   Received from [facility]  Stress: No Stress Concern Present (07/01/2023)   Received from [facility]  Tobacco Use: High Risk (12/06/2023)    Readmission Risk Interventions     No data to display

## 2023-12-30 NOTE — Plan of Care (Signed)

## 2023-12-30 NOTE — Progress Notes (Signed)
 Nutrition Follow-up  DOCUMENTATION CODES:   Severe malnutrition in context of chronic illness  INTERVENTION:  *** Continue double proteins to supplement intake Continue liberalized diet to encourage adequate calorie/protein intake Continue Ensure Enlive po BID, each supplement provides 350 kcal and 20 grams of protein. Continue Magic cup TID with meals, each supplement provides 290 kcal and 9 grams of protein Continue MVI w/ minerals Continue Florastor r/t ABX administration to maintain gut microbiome Initiate bowel regimen   NUTRITION DIAGNOSIS:   Severe Malnutrition related to chronic illness (COPD, Hep C, hx IV drug use) as evidenced by severe fat depletion, severe muscle depletion.  ***  GOAL:   Patient will meet greater than or equal to 90% of their needs  ***  MONITOR:   PO intake, Supplement acceptance, Weight trends  REASON FOR ASSESSMENT:   Consult Assessment of nutrition requirement/status  ASSESSMENT:   Pt with PMH: MRSA bacteremia, chronic HFrEF(20-25%), PE/DVT, chronic Hep C, hx IV drug use, COPD, and hx nonischemic myopathy endorsed as 2/2 to methamphetamine use. Presents to ED with c/o lower back pain. Of note, discharged from [facility] 3/22 and prefers not to return.  Pateints intake fluctuates with bowel regimen. IF constipated he will not eat. BM today last real BN 4 days ago had a small one yesterday. Had oatmeal for breakfast and 100% of lunch. Eatign 1-2 MC per day and 1-2 Ensuresper day.  Refused Ensure yesterday  ***  Admit weight: *** Current weight: ***  Last Weight  Most recent update: 12/30/2023  8:08 AM    Weight  55.7 kg (122 lb 12.7 oz)             Average Meal Intake: ***-***: ***% intake x *** recorded meals  Nutritionally Relevant Medications: Scheduled Meds:  apixaban  5 mg Oral BID   atorvastatin  80 mg Oral QHS   carvedilol  3.125 mg Oral BID WC   Chlorhexidine Gluconate Cloth  6 each Topical Daily   famotidine   20 mg Oral Daily   feeding supplement  237 mL Oral TID BM   furosemide  40 mg Oral Daily   hydrocerin   Topical BID   lactulose  20 g Oral Daily   multivitamin with minerals  1 tablet Oral Daily   polyethylene glycol  17 g Oral BID   saccharomyces boulardii  250 mg Oral BID   senna-docusate  2 tablet Oral QHS   spironolactone  25 mg Oral Daily   Continuous Infusions:  vancomycin Stopped (12/29/23 1602)   Labs Reviewed: *** CBG ranges from ***-*** mg/dL over the last 24 hours HgbA1c ***   NUTRITION - FOCUSED PHYSICAL EXAM:  {RD Focused Exam List:21252}  Diet Order:   Diet Order             Diet regular Room service appropriate? Yes; Fluid consistency: Thin; Fluid restriction: 1200 mL Fluid  Diet effective now                   EDUCATION NEEDS:   Education needs have been addressed  Skin:  Skin Assessment: Skin Integrity Issues: Skin Integrity Issues:: Incisions DTI: open/dehisced wound: mid sacrum Incisions: R hip puncture  Last BM:  4/5  Height:   Ht Readings from Last 1 Encounters:  12/07/23 6\' 1"  (1.854 m)    Weight:   Wt Readings from Last 1 Encounters:  12/30/23 55.7 kg    Ideal Body Weight:  83.6 kg  BMI:  Body mass index is  16.2 kg/m.  Estimated Nutritional Needs:   Kcal:  2000-2200kcal  Protein:  100-115g  Fluid:  >1.9L/day    ***

## 2023-12-31 DIAGNOSIS — R7881 Bacteremia: Secondary | ICD-10-CM | POA: Diagnosis not present

## 2023-12-31 DIAGNOSIS — B9562 Methicillin resistant Staphylococcus aureus infection as the cause of diseases classified elsewhere: Secondary | ICD-10-CM | POA: Diagnosis not present

## 2023-12-31 NOTE — Plan of Care (Signed)

## 2023-12-31 NOTE — Progress Notes (Signed)
 PROGRESS NOTE    [name] [name]  ZOX:[id number] DOB: [birth year] DOA: 12/06/2023 PCP: Patient, No Pcp Per   Brief Narrative: [name] [name] is a 63 y.o. male with a history of chronic HFrEF, VT on Eliquis, IV drug use, COPD, chronic HCV, MRSA bacteremia and endocarditis.  Patient was recently admitted to [facility] for his MRSA bacteremia with endocarditis and per report, patient was uncooperative during the hospitalization with and was refusing workup/treatments; patient was given 1 dose of dalbavancin prior to discharge and presenting at [facility] for evaluation and management of his infection.  Patient found to have multiple thoracic/lumbar compression fractures in addition to C-spine osteomyelitis/discitis.  Infectious ease was consulted with recommendations for antibiotics and patient is currently managed on vancomycin IV.  No surgical management for osteomyelitis/discitis.  PICC line placed this admission for prolonged IV antibiotics.   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 Neurosurgery consulted for spine osteomyelitis/discitis with recommendation for medical management. Transthoracic Echocardiogram deferred secondary to history of esophageal stricture and overall medical concerns, although Transthoracic Echocardiogram significant for no vegetation mentioned. Orthopedic surgery consulted secondary to prosthetic hip infection with recommendation for reconstructive surgery vs disarticulation at a tertiary center; patient previously seen at [facility]. PICC placed on 3/24 for prolonged IV antibiotics. -Continue Vancomycin IV; monitor vancomycin levels  T12,L2, L3, L5 compression fractures -Continue pain management  Chronic HFrEF LVEF of 20-25% with global hypokinesis and  grade 1 diastolic dysfunction. Currently seems to be euvolemic.  History of VTE Noted. 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  Fluid restriction ordered and patient started on furosemide with improved sodium.  Sodium up to 128. -Continue furosemide -Recheck urine sodium and osmolality -BMP pending  Chronic hepatitis C infection Noted.  Outpatient follow-up with infectious disease.  Polysubstance abuse IVDU Noted.  Recent UDS significant for amphetamines and cocaine.  Patient counseled.  Patient will need PICC line removed prior to discharge from the hospital.  Tobacco use Noted.  Right inguinal hernia Noted on CT incidentally.  No evidence of obstruction or incarceration.  Severe malnutrition -Dietitian recommendations (4/7): Continue double proteins to supplement intake Continue liberalized diet to encourage adequate calorie/protein intake Continue Ensure Enlive po BID, each supplement provides 350 kcal and 20 grams of protein. Continue Magic cup TID with meals, each supplement provides 290 kcal and 9 grams of protein Continue MVI w/ minerals Continue Florastor r/t ABX administration to maintain gut microbiome Initiate bowel regimen  Underweight Estimated body mass index is 16.17 kg/m as calculated from the following:   Height as of this encounter: 6\' 1"  (1.854 m).   Weight as of this encounter: 55.6 kg.  DVT prophylaxis: Eliquis Code Status:   Code Status: Full Code Family Communication: None at bedside Disposition Plan: Discharge home after completion of IV antibiotics   Consultants:  Cardiology Infectious disease Orthopedic surgery Palliative care medicine Interventional radiology  Procedures:    Antimicrobials: Vancomycin IV    Subjective: Continues to report pain in his right hip and lower back.  Objective: BP 123/84 (BP Location: Left Arm)   Pulse 81   Temp 97.8 F (36.6 C)   Resp 17   Ht 6\' 1"  (1.854 m)   Wt 55.6 kg    SpO2 99%   BMI 16.17 kg/m   Examination:  General exam: Appears calm and comfortable.  Cachectic. Respiratory system: Respiratory effort normal. Central nervous system: Alert and oriented. Musculoskeletal: No edema. No calf tenderness Psychiatry: Judgement and insight appear normal. Mood & affect appropriate.    Data Reviewed: I have personally reviewed following labs and imaging studies  CBC Lab Results  Component Value Date   WBC 8.4 12/21/2023   RBC 3.15 (L) 12/21/2023   HGB 9.0 (L) 12/21/2023   HCT 27.4 (L) 12/21/2023   MCV 87.0 12/21/2023   MCH 28.6 12/21/2023   PLT 264 12/21/2023   MCHC 32.8 12/21/2023   RDW 16.7 (H) 12/21/2023   LYMPHSABS 1.0 12/17/2023   MONOABS 0.7 12/17/2023   EOSABS 0.5 12/17/2023   BASOSABS 0.0 12/17/2023     Last metabolic panel Lab Results  Component Value Date   NA 129 (L) 12/29/2023   K 3.9 12/29/2023   CL 91 (L) 12/29/2023   CO2 27 12/29/2023   BUN 31 (H) 12/29/2023   CREATININE 0.80 12/29/2023   GLUCOSE 122 (H) 12/29/2023   GFRNONAA >60 12/29/2023   GFRAA >60 09/09/2018   CALCIUM 9.0 12/29/2023   PHOS 2.9 12/07/2023   PROT 7.3 12/17/2023   ALBUMIN 2.0 (L) 12/17/2023   BILITOT 0.6 12/17/2023   ALKPHOS 68 12/17/2023   AST 22 12/17/2023   ALT 18 12/17/2023   ANIONGAP 11 12/29/2023    GFR: Estimated Creatinine Clearance: 74.3 mL/min (by C-G formula based on SCr of 0.8 mg/dL).  No results found for this or any previous visit (from the past 240 hours).     Radiology Studies: No results found.    LOS: 24 days    Aneita Keens, MD [facility] 12/31/2023, 11:42 AM   If 7PM-7AM, please contact night-coverage [email or web address]

## 2024-01-01 DIAGNOSIS — R7881 Bacteremia: Secondary | ICD-10-CM | POA: Diagnosis not present

## 2024-01-01 DIAGNOSIS — B9562 Methicillin resistant Staphylococcus aureus infection as the cause of diseases classified elsewhere: Secondary | ICD-10-CM | POA: Diagnosis not present

## 2024-01-01 LAB — BASIC METABOLIC PANEL WITH GFR
Anion gap: 7 (ref 5–15)
BUN: 25 mg/dL — ABNORMAL HIGH (ref 8–23)
CO2: 27 mmol/L (ref 22–32)
Calcium: 9.2 mg/dL (ref 8.9–10.3)
Chloride: 95 mmol/L — ABNORMAL LOW (ref 98–111)
Creatinine, Ser: 0.88 mg/dL (ref 0.61–1.24)
GFR, Estimated: >60 mL/min (ref >60)
Glucose, Bld: 91 mg/dL (ref 70–99)
Potassium: 4 mmol/L (ref 3.5–5.1)
Sodium: 129 mmol/L — ABNORMAL LOW (ref 135–145)

## 2024-01-01 LAB — CBC
HCT: 29.8 % — ABNORMAL LOW (ref 39.0–52.0)
Hemoglobin: 9.9 g/dL — ABNORMAL LOW (ref 13.0–17.0)
MCH: 28.9 pg (ref 26.0–34.0)
MCHC: 33.2 g/dL (ref 30.0–36.0)
MCV: 86.9 fL (ref 80.0–100.0)
Platelets: 279 10*3/uL (ref 150–400)
RBC: 3.43 MIL/uL — ABNORMAL LOW (ref 4.22–5.81)
RDW: 16.7 % — ABNORMAL HIGH (ref 11.5–15.5)
WBC: 8.9 10*3/uL (ref 4.0–10.5)
nRBC: 0 % (ref 0.0–0.2)

## 2024-01-01 NOTE — Plan of Care (Signed)
  Problem: Clinical Measurements: Goal: Will remain free from infection Outcome: Progressing   Problem: Coping: Goal: Level of anxiety will decrease Outcome: Progressing   Problem: Elimination: Goal: Will not experience complications related to bowel motility Outcome: Progressing   Problem: Skin Integrity: Goal: Risk for impaired skin integrity will decrease Outcome: Progressing   Problem: Safety: Goal: Ability to remain free from injury will improve Outcome: Progressing

## 2024-01-01 NOTE — Progress Notes (Signed)
 [facility] PROGRESS NOTE  Patient: [name] [name] GMW:[id number]   PCP: Patient, No Pcp Per DOB: 1961-02-11   DOA: 12/06/2023   DOS: 01/01/2024    Subjective: Reports uncontrolled pain.  No nausea vomiting fever no chills.  No diarrhea no constipation.  Objective:  Vitals:   01/01/24 0507 01/01/24 0842 01/01/24 1615 01/01/24 1955  BP: 119/72 134/82 114/72 102/70  Pulse: 85 75 81 90  Resp: 18 17 17 18   Temp: 98.3 F (36.8 C) 97.7 F (36.5 C) (!) 97.5 F (36.4 C) 98.2 F (36.8 C)  TempSrc: Oral   Oral  SpO2: 98% 99% 100% 96%  Weight:      Height:       Clear to auscultation S1-S2 present blood Bowel sound present nontender. No edema.  Assessment and plan: MRSA bacteremia with right prosthetic septic Arthritis. Tricuspid valve endocarditis. Currently on IV antibiotic. Definitive management at tertiary center limited.  Patient currently thinking.  Multiple compression fracture. Continue pain control.  Chronic combined CHF. EF 20 to 25%   Author: Charlean Congress, MD [facility] 01/01/2024 8:15 PM   If 7PM-7AM, please contact night-coverage at [email or web address]

## 2024-01-02 DIAGNOSIS — R7881 Bacteremia: Secondary | ICD-10-CM | POA: Diagnosis not present

## 2024-01-02 DIAGNOSIS — B9562 Methicillin resistant Staphylococcus aureus infection as the cause of diseases classified elsewhere: Secondary | ICD-10-CM | POA: Diagnosis not present

## 2024-01-02 LAB — BASIC METABOLIC PANEL WITH GFR
Anion gap: 10 (ref 5–15)
BUN: 29 mg/dL — ABNORMAL HIGH (ref 8–23)
CO2: 25 mmol/L (ref 22–32)
Calcium: 8.7 mg/dL — ABNORMAL LOW (ref 8.9–10.3)
Chloride: 93 mmol/L — ABNORMAL LOW (ref 98–111)
Creatinine, Ser: 0.89 mg/dL (ref 0.61–1.24)
GFR, Estimated: >60 mL/min (ref >60)
Glucose, Bld: 101 mg/dL — ABNORMAL HIGH (ref 70–99)
Potassium: 3.8 mmol/L (ref 3.5–5.1)
Sodium: 128 mmol/L — ABNORMAL LOW (ref 135–145)

## 2024-01-02 LAB — MAGNESIUM: Magnesium: 1.7 mg/dL (ref 1.7–2.4)

## 2024-01-02 LAB — VANCOMYCIN, PEAK
Vancomycin Pk: 58 ug/mL (ref 30–40)
Vancomycin Pk: 31 ug/mL (ref 30–40)

## 2024-01-02 MED ORDER — ACETAMINOPHEN 500 MG PO TABS
500.0000 mg | ORAL_TABLET | Freq: Four times a day (QID) | ORAL | Status: DC
  Administered 2024-01-02 – 2024-01-04 (×11): 500 mg via ORAL
  Filled 2024-01-02 (×12): qty 1

## 2024-01-02 MED ORDER — COLCHICINE 0.6 MG PO TABS
0.6000 mg | ORAL_TABLET | Freq: Every day | ORAL | Status: DC
  Administered 2024-01-02 – 2024-01-04 (×3): 0.6 mg via ORAL
  Filled 2024-01-02 (×4): qty 1

## 2024-01-02 MED ORDER — ESCITALOPRAM OXALATE 20 MG PO TABS
20.0000 mg | ORAL_TABLET | Freq: Every day | ORAL | Status: DC
  Administered 2024-01-02 – 2024-01-04 (×3): 20 mg via ORAL
  Filled 2024-01-02 (×4): qty 1

## 2024-01-02 MED ORDER — FUROSEMIDE 40 MG PO TABS
40.0000 mg | ORAL_TABLET | ORAL | Status: DC
  Administered 2024-01-04: 40 mg via ORAL
  Filled 2024-01-02: qty 1

## 2024-01-02 MED ORDER — HYDROMORPHONE HCL 2 MG PO TABS
1.0000 mg | ORAL_TABLET | ORAL | Status: DC | PRN
  Administered 2024-01-02 – 2024-01-03 (×4): 1 mg via ORAL
  Filled 2024-01-02 (×4): qty 1

## 2024-01-02 MED ORDER — HYDROXYZINE HCL 25 MG PO TABS
25.0000 mg | ORAL_TABLET | Freq: Three times a day (TID) | ORAL | Status: DC
  Administered 2024-01-02 – 2024-01-04 (×9): 25 mg via ORAL
  Filled 2024-01-02 (×10): qty 1

## 2024-01-02 MED ORDER — OXYCODONE HCL 5 MG PO TABS
10.0000 mg | ORAL_TABLET | ORAL | Status: DC | PRN
  Administered 2024-01-03 – 2024-01-04 (×6): 10 mg via ORAL
  Filled 2024-01-02 (×6): qty 2

## 2024-01-02 MED ORDER — METHOCARBAMOL 500 MG PO TABS: 500.0000 mg | ORAL_TABLET | Freq: Three times a day (TID) | ORAL | Status: DC | PRN

## 2024-01-02 MED ORDER — FERROUS SULFATE 325 (65 FE) MG PO TABS
325.0000 mg | ORAL_TABLET | Freq: Every day | ORAL | Status: DC
  Administered 2024-01-03 – 2024-01-04 (×2): 325 mg via ORAL
  Filled 2024-01-02 (×3): qty 1

## 2024-01-02 NOTE — Plan of Care (Signed)
  Problem: Activity: Goal: Risk for activity intolerance will decrease 01/02/2024 0554 by Clementina Cutter, RN Outcome: Progressing 01/02/2024 0554 by Clementina Cutter, RN Outcome: Not Progressing   Problem: Nutrition: Goal: Adequate nutrition will be maintained 01/02/2024 0554 by Clementina Cutter, RN Outcome: Progressing 01/02/2024 0554 by Clementina Cutter, RN Outcome: Not Progressing   Problem: Education: Goal: Knowledge of General Education information will improve Description: Including pain rating scale, medication(s)/side effects and non-pharmacologic comfort measures Outcome: Not Progressing   Problem: Health Behavior/Discharge Planning: Goal: Ability to manage health-related needs will improve 01/02/2024 0554 by Kayline Sheer C, RN Outcome: Not Progressing 01/02/2024 0554 by Clementina Cutter, RN Outcome: Not Progressing   Problem: Clinical Measurements: Goal: Ability to maintain clinical measurements within normal limits will improve Outcome: Not Progressing Goal: Will remain free from infection Outcome: Not Progressing Goal: Diagnostic test results will improve Outcome: Not Progressing Goal: Respiratory complications will improve Outcome: Not Progressing Goal: Cardiovascular complication will be avoided Outcome: Not Progressing   Problem: Coping: Goal: Level of anxiety will decrease 01/02/2024 0554 by Clementina Cutter, RN Outcome: Not Progressing 01/02/2024 0554 by Clementina Cutter, RN Outcome: Not Progressing   Problem: Elimination: Goal: Will not experience complications related to bowel motility Outcome: Not Progressing Goal: Will not experience complications related to urinary retention Outcome: Not Progressing   Problem: Pain Managment: Goal: General experience of comfort will improve and/or be controlled Outcome: Not Progressing   Problem: Safety: Goal: Ability to remain free from injury will improve Outcome: Not Progressing   Problem: Skin  Integrity: Goal: Risk for impaired skin integrity will decrease Outcome: Not Progressing

## 2024-01-02 NOTE — Progress Notes (Signed)
 Received from the night charge nurse that pt had been disrespectful to the nurse last night and had talked about leaving AMA, paper was signed. I called Security to come into the room with me and talked about how we needed to work together for his care. Gave pt Dilaudid IV pain me since his pain he stated was a 10/10 and the PO med had not worked. I went ahead and gave all his am meds and did his CHG bath. Pt became more calm. Security will be notified if needed for disruptive behavior again.

## 2024-01-02 NOTE — Plan of Care (Signed)

## 2024-01-02 NOTE — Progress Notes (Signed)
 Pt requested pain meds at the time the PO meds were available. I explained he could have the Oxy. The patient said he took the oxy last and it was time for the dilaudid. I explained that as long as the PO med was available the orders state we have to use it first, the patient became irate immediately yelling and making threats. The charge nurse went in rm and spoke w him regarding the Dr order and the fact we cannot over ride the physicians orders. The patient told charge he wanted the PO pain meds and he would not say anything to me and for me (primary RN) to not say anything to him. I gave the PO meds, within 5 mins the patient called to the secretary and started yelling that he was leaving and requested "security escort so I don't slap the sh** out of one of you", he continued to demand he was leaving and he was going to "pull his PICC out". The charge and myself went in and explained the AMA, and pt signed it. Provider was notified and called. The patient said he did not know he had an infection in his heart and "guessed he would stay". I encouraged the patient to tell the Dr what he was so upset about and he said "no the only person I am explaining anything to is a Clinical research associate". This is just one event this shift between pt and nurse. There were multiple before this-but not to this escalation. Pt has refused positioning, teaching, wound care etc.

## 2024-01-02 NOTE — Progress Notes (Signed)
 [facility] Progress Note Patient: [name] [name] ZOX:[id number] DOB: 08-13-1961 DOA: 12/06/2023  DOS: the patient was seen and examined on 01/02/2024  Brief Hospital Course: [name] [name] is a 63 y.o. male with a history of chronic HFrEF, VT on Eliquis, IV drug use, COPD, chronic HCV, MRSA bacteremia and endocarditis.  Recently admitted to [facility] for his MRSA bacteremia with endocarditis and per report, patient was uncooperative during the hospitalization with and was refusing workup/treatments; patient was given 1 dose of dalbavancin prior to discharge and presenting at [facility] for evaluation and management of his infection. ID, neurosurgery, cardiology, orthopedic surgery were consulted due to presentation with septic arthritis of the right hip as well as C2-C3 osteomyelitis with possible septic arthritis. patient is currently managed on vancomycin IV. No surgical management for now recommended.  PICC line placed this admission for prolonged IV antibiotics.  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  Evaluated by neurosurgery -Dr. Nat Badger recommended medical management.  TEE deferred secondary to history of esophageal stricture.  TTE EF 20 to 25%.  Tricuspid valve thickening With possible mobile components seen on echocardiogram in outside facility on 11/27/2023. no vegetation mentioned on echo performed here. Orthopedic surgery consulted- "His imaging was reviewed by multiple joint replacement surgeons. 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 It's possible he's a candidate for some sort of pelvic reconstruction surgery but it's nothing anyone in this community does. Recommend transfer to tertiary center for further consideration. " Patient has  been reluctant to be transferred.  May not be a candidate given history of IVDU as well as cardiomyopathy as well. PICC placed on 3/24 for prolonged IV antibiotics. Not a candidate for home antibiotic given history of IVDU. Zyvox, Cipro, Bactrim appears to be an oral alternative.  Continue Vancomycin IV; monitor vancomycin levels Ideally will require an MRI C-spine and hip when nearing end of therapy.  Will also require ID reconsultation, when nearing end of therapy.   Chronic T12,L2, L3, L5 compression fractures Continue pain management   Chronic HFrEF Prolonged history.  Currently euvolemic. Cardiology recommending Aldactone, Coreg, low-dose losartan.  No SGLT2 inhibitor given risk of infection.   On Lasix.   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  Appears to be provoked event in the setting of right knee fracture in 2012.  Had IVC filter placed as well. IVC filter still in placed based on CT 11/2023. Has been on Eliquis chronically. Last fill was 08/2023 for 90 days.   SIADH-hyponatremia Serum osm 275.  Urine osmolality 373. Urine sodium 71. Currently stable at around 129.  Asymptomatic.  Continue fluid restriction and Lasix.   Chronic hepatitis C infection HIV negative.  HBsAg nonreactive.  Hep B S AB Quant 3,959.0 (Immunity>10 mIU/mL) HEP B CORE AB Positive HCV RNA 11,800,000 ID recommending outpatient follow-up.   Polysubstance abuse H/o IVDU Recent UDS significant for amphetamines and cocaine. Patient counseled to stop. Patient will need PICC line removed prior to discharge from the hospital.   Tobacco use Noted.   Right inguinal hernia Noted on CT incidentally. No evidence of obstruction or incarceration.   Severe protein calorie malnutrition  Underweight Body mass index is 16.2 kg/m.  Dietitian following.  Continue supplements. Placing the patient at high risk of poor outcome.  Certainly adding to his poor surgical candidacy.  Anemia of  nutritional deficiency as well as chronic disease In the setting of malnutrition with osteomyelitis Continue supplement.  Hemoglobin stable.  Gout. Synovial fluid analysis along with growing MRSA also showed intracellular monosodium urate crystals. Will initiate colchicine.  No renal or hepatic on indication so far.  Pain management. Patient reports uncontrolled pain.  Yet His vital signs are stable.  Patient has chronic appearing compression fractures, chronic right hip pain based on the chart going back to 2019 and currently being treated for MRSA bacteremia with septic arthritis. Does have history of substance use disorder including history of IVDU. At present continue Oxy IR 10 mg every 3 as needed.  (Frequency increased on 4/17). Switch IV Dilaudid 0.5 mg every 4 hours as needed to 1 mg every 4 hours as needed oral. Add scheduled Tylenol. Add as needed Robaxin. Patient told me that he is not interested in nonnarcotic medications for his pain control.  Depression. Known history. Multiple admissions with suicidal ideation in the past. Has been on Lexapro 20 mg daily. His hyponatremia does not appear to be related to the medication therefore we will resume this medication for now. Also on Vistaril chronically which I will resume.  Compliance issues. Patient has history of leaving AMA multiple times without completing therapy in the hospital. Threatened to leave AMA again on 4/16. Concern if he is compliant with his long-term medications for medical issues. Certainly places him at a high risk for poor outcome for major interventions and readmission.   Subjective: No nausea no vomiting.  Currently BM yesterday.  Currently agreeable to stay in the hospital.  Physical Exam: General: in Mild distress, No Rash Cardiovascular: S1 and S2 Present, aortic systolic Murmur Respiratory: Good respiratory effort, Bilateral Air entry present. No Crackles, No wheezes Abdomen: Bowel Sound  present, No tenderness Extremities: No edema Neuro: Alert and oriented x3, no new focal deficit  Data Reviewed: I have Reviewed nursing notes, Vitals, and Lab results. Since last encounter, pertinent lab results CBC and BMP   . I have ordered test including BMP and magnesium  .   Disposition: Status is: Inpatient Remains inpatient appropriate because: Need IV antibiotic  apixaban (ELIQUIS) tablet 5 mg   Family Communication: None at bedside. Level of care: Med-Surg  Vitals:   01/02/24 0459 01/02/24 0500 01/02/24 0906 01/02/24 1748  BP: 124/81  118/71 115/71  Pulse: 90  86 79  Resp: 16  17 17   Temp: 98.4 F (36.9 C)  97.8 F (36.6 C) 98.2 F (36.8 C)  TempSrc: Oral  Oral Oral  SpO2: 100%  99% 98%  Weight:  55.7 kg    Height:         Author: Charlean Congress, MD 01/02/2024 8:07 PM  Please look on [email or web address] to find out who is on call.

## 2024-01-03 DIAGNOSIS — B9562 Methicillin resistant Staphylococcus aureus infection as the cause of diseases classified elsewhere: Secondary | ICD-10-CM | POA: Diagnosis not present

## 2024-01-03 DIAGNOSIS — R7881 Bacteremia: Secondary | ICD-10-CM | POA: Diagnosis not present

## 2024-01-03 LAB — VANCOMYCIN, TROUGH: Vancomycin Tr: 13 ug/mL — ABNORMAL LOW (ref 15–20)

## 2024-01-03 LAB — BASIC METABOLIC PANEL WITH GFR
Anion gap: 11 (ref 5–15)
BUN: 28 mg/dL — ABNORMAL HIGH (ref 8–23)
CO2: 24 mmol/L (ref 22–32)
Calcium: 9 mg/dL (ref 8.9–10.3)
Chloride: 97 mmol/L — ABNORMAL LOW (ref 98–111)
Creatinine, Ser: 0.9 mg/dL (ref 0.61–1.24)
GFR, Estimated: >60 mL/min (ref >60)
Glucose, Bld: 95 mg/dL (ref 70–99)
Potassium: 4.2 mmol/L (ref 3.5–5.1)
Sodium: 132 mmol/L — ABNORMAL LOW (ref 135–145)

## 2024-01-03 LAB — MAGNESIUM: Magnesium: 1.8 mg/dL (ref 1.7–2.4)

## 2024-01-03 NOTE — Progress Notes (Signed)
 [facility] Progress Note Patient: [name] [name] FMW:[id number] DOB: 12/08/1960 DOA: 12/06/2023  DOS: the patient was seen and examined on 01/03/2024  Brief Hospital Course: [name] [name] is a 63 y.o. male with a history of chronic HFrEF, VT on Eliquis , IV drug use, COPD, chronic HCV, MRSA bacteremia and endocarditis.  Recently admitted to [facility] for his MRSA bacteremia with endocarditis and per report, patient was uncooperative during the hospitalization with and was refusing workup/treatments; patient was given 1 dose of dalbavancin prior to discharge and presenting at [facility] for evaluation and management of his infection. ID, neurosurgery, cardiology, orthopedic surgery were consulted due to presentation with septic arthritis of the right hip as well as C2-C3 osteomyelitis with possible septic arthritis. patient is currently managed on vancomycin  IV. No surgical management for now recommended.  PICC line placed this admission for prolonged IV antibiotics. Threatened to leave AMA on 04/16 and 4/18.   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  Evaluated by neurosurgery -Dr. Lanis recommended medical management.  TEE deferred secondary to history of esophageal stricture.  TTE EF 20 to 25%.  Tricuspid valve thickening With possible mobile components seen on echocardiogram in outside facility on 11/27/2023. no vegetation mentioned on echo performed here. Orthopedic surgery consulted- His imaging was reviewed by multiple joint replacement surgeons. 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 It's possible he's a candidate for some sort of pelvic reconstruction surgery but it's nothing anyone in this community does. Recommend transfer to tertiary  center for further consideration.  Patient has been reluctant to be transferred.  May not be a candidate given history of IVDU as well as cardiomyopathy as well. PICC placed on 3/24 for prolonged IV antibiotics. Not a candidate for home antibiotic given history of IVDU. Zyvox , Cipro, Bactrim appears to be an oral alternative.  Continue Vancomycin  IV; monitor vancomycin  levels Ideally will require an MRI C-spine and hip when nearing end of therapy.  Will also require ID reconsultation, when nearing end of therapy.  Chronic T12,L2, L3, L5 compression fractures Continue pain management   Chronic HFrEF Prolonged history.  Currently euvolemic. Cardiology recommending Aldactone , Coreg , low-dose losartan.  No SGLT2 inhibitor given risk of infection.   On Lasix .   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  Appears to be provoked event in the setting of right knee fracture in 2012.  Had IVC filter placed as well. IVC filter still in placed based on CT 11/2023. Has been on Eliquis  chronically. Last fill was 08/2023 for 90 days.   SIADH-hyponatremia Serum osm 275.  Urine osmolality 373. Urine sodium 71. Currently stable at around 129.  Asymptomatic.  Continue fluid restriction and Lasix .   Chronic hepatitis C infection HIV negative.  HBsAg nonreactive.  Hep B S AB Quant 3,959.0 (Immunity>10 mIU/mL) HEP B CORE AB Positive HCV RNA 11,800,000 ID recommending outpatient follow-up.   Polysubstance abuse H/o IVDU Recent UDS significant for amphetamines and cocaine. Patient counseled to stop. Patient will need PICC line removed prior to discharge from the hospital.   Tobacco use Noted.   Right inguinal hernia Noted on CT incidentally. No evidence of obstruction or incarceration.   Severe protein calorie malnutrition  Underweight Body mass index is 16.2 kg/m.  Dietitian following.  Continue supplements. Placing the patient at high risk of poor outcome.  Certainly adding to  his poor surgical candidacy.  Anemia of nutritional deficiency as well as chronic disease In the setting of malnutrition with osteomyelitis Continue supplement.  Hemoglobin stable.  Gout. Synovial fluid analysis along with growing MRSA also showed intracellular monosodium urate crystals. Will initiate colchicine .  No renal or hepatic on indication so far.  Pain management. Patient reports uncontrolled pain.  Yet His vital signs are stable.  Patient has chronic appearing compression fractures, chronic right hip pain based on the chart going back to 2019 and currently being treated for MRSA bacteremia with septic arthritis. Does have history of substance use disorder including history of IVDU. Based on his use in the last 24-hour patient will be on Oxy IR 10 mg every 3 hours as needed.  Will discontinue Dilaudid . Add scheduled Tylenol . Add as needed Robaxin . Patient told me that he is not interested in nonnarcotic medications for his pain control.  Depression. Known history. Multiple admissions with suicidal ideation in the past. Has been on Lexapro  20 mg daily. His hyponatremia does not appear to be related to the medication therefore we will resume this medication for now. Also on Vistaril  chronically which I will resume.  Compliance issues. Patient has history of leaving AMA multiple times without completing therapy in the hospital. Threatened to leave AMA again on 4/16 and on 4/18. Concern if he is compliant with his long-term medications for medical issues. Certainly places him at a high risk for poor outcome for major interventions and readmission. Not a candidate for off unit privileges off campus with PICC line.   Subjective: No nausea no vomiting no fever no chills.  Threatening to leave AMA  Physical Exam: General: in Mild distress, No Rash Cardiovascular: S1 and S2 Present, No Murmur Respiratory: Good respiratory effort, Bilateral Air entry present. No Crackles, No  wheezes Abdomen: Bowel Sound present, No tenderness Extremities: No edema Neuro: Alert and oriented x3, no new focal deficit  Data Reviewed: I have Reviewed nursing notes, Vitals, and Lab results. Since last encounter, pertinent lab results BMP   . I have ordered test including BMP  .  Disposition: Status is: Inpatient Remains inpatient appropriate because: Need antibiotics   apixaban  (ELIQUIS ) tablet 5 mg   Family Communication: No one at bedside Level of care: Med-Surg   Vitals:   01/03/24 0425 01/03/24 0500 01/03/24 0923 01/03/24 1701  BP: 112/69  (!) 148/97 (!) 140/99  Pulse: 86  83 89  Resp:   17 17  Temp: 98.6 F (37 C)   98.3 F (36.8 C)  TempSrc:    Oral  SpO2: 96%  100% 99%  Weight:  58 kg    Height:         Author: Yetta Blanch, MD 01/03/2024 7:40 PM  Please look on [email or web address] to find out who is on call.

## 2024-01-03 NOTE — Plan of Care (Signed)
  Problem: Education: Goal: Knowledge of General Education information will improve Description Including pain rating scale, medication(s)/side effects and non-pharmacologic comfort measures Outcome: Progressing   Problem: Clinical Measurements: Goal: Ability to maintain clinical measurements within normal limits will improve Outcome: Progressing Goal: Will remain free from infection Outcome: Progressing Goal: Diagnostic test results will improve Outcome: Progressing Goal: Cardiovascular complication will be avoided Outcome: Progressing   Problem: Activity: Goal: Risk for activity intolerance will decrease Outcome: Progressing   Problem: Nutrition: Goal: Adequate nutrition will be maintained Outcome: Progressing   Problem: Coping: Goal: Level of anxiety will decrease Outcome: Progressing   Problem: Elimination: Goal: Will not experience complications related to bowel motility Outcome: Progressing   Problem: Safety: Goal: Ability to remain free from injury will improve Outcome: Progressing   Problem: Skin Integrity: Goal: Risk for impaired skin integrity will decrease Outcome: Progressing

## 2024-01-03 NOTE — Progress Notes (Signed)
 Pharmacy Antibiotic Note  [name] [name] is a 63 y.o. male admitted on 12/06/2023 with back pain, possible osteomyelitis.  Admitted 3/8-3/21 at [facility] for MRSA bacteremia and possible endocarditis/osteomyelitis --received Vancomycin  throughout admission.  Plan was for discharge 3/21 and administration of Dalvancin weekly, but dose not given.  Pharmacy has been consulted for Vancomycin   dosing.   Vancomycin  levels on 4/17 and 4/18 show a therapeutic AUC of 515, renal function stable.  Plan: Continue vancomycin  1000mg  IV q24h    Height: 6\' 1"  (185.4 cm) Weight: 58 kg (127 lb 13.9 oz) IBW/kg (Calculated) : 79.9  Temp (24hrs), Avg:98.4 F (36.9 C), Min:98.2 F (36.8 C), Max:98.6 F (37 C)  Recent Labs  Lab 12/28/23 1150 12/29/23 1821 01/01/24 0421 01/02/24 1006 01/02/24 1803 01/02/24 2040 01/03/24 0306 01/03/24 1430  WBC  --   --  8.9  --   --   --   --   --   CREATININE  --  0.80 0.88 0.89  --   --  0.90  --   VANCOTROUGH 15  --   --   --   --   --   --  13*  VANCOPEAK  --   --   --   --  58* 31  --   --     Estimated Creatinine Clearance: 68.9 mL/min (by C-G formula based on SCr of 0.9 mg/dL).    Allergies  Allergen Reactions   Fentanyl  Itching   Hydromorphone  Hcl Itching    Pt  States itching all over Pt  States itching all over Pt  States itching all over    Morphine Itching   Pantoprazole Hives   Pantoprazole Sodium Hives   Haloperidol  Other (See Comments)    Pt reports he has "seizures" had haldol  on 09/25/21 with no apparent ill effect.  Pt reports he has "seizures"   Nsaids Nausea And Vomiting, Other (See Comments) and Hives    Severe cramping Other reaction(s): Abdominal Pain Cramping per [place]   Morphine And Codeine Itching   Ibuprofen    Ketorolac Other (See Comments)   Pantoprazole Sodium Hives     Karan Inclan, PharmD, BCPS, [facility] Clinical Pharmacist [phone] Please check AMION for all [facility]  numbers 01/03/2024

## 2024-01-04 DIAGNOSIS — R7881 Bacteremia: Secondary | ICD-10-CM | POA: Diagnosis not present

## 2024-01-04 DIAGNOSIS — B9562 Methicillin resistant Staphylococcus aureus infection as the cause of diseases classified elsewhere: Secondary | ICD-10-CM | POA: Diagnosis not present

## 2024-01-04 LAB — BASIC METABOLIC PANEL WITH GFR
Anion gap: 8 (ref 5–15)
BUN: 27 mg/dL — ABNORMAL HIGH (ref 8–23)
CO2: 26 mmol/L (ref 22–32)
Calcium: 9.1 mg/dL (ref 8.9–10.3)
Chloride: 98 mmol/L (ref 98–111)
Creatinine, Ser: 0.94 mg/dL (ref 0.61–1.24)
GFR, Estimated: >60 mL/min (ref >60)
Glucose, Bld: 94 mg/dL (ref 70–99)
Potassium: 4.3 mmol/L (ref 3.5–5.1)
Sodium: 132 mmol/L — ABNORMAL LOW (ref 135–145)

## 2024-01-04 LAB — MAGNESIUM: Magnesium: 1.7 mg/dL (ref 1.7–2.4)

## 2024-01-04 MED ORDER — HYDROMORPHONE HCL 2 MG PO TABS
1.0000 mg | ORAL_TABLET | ORAL | Status: DC | PRN
  Administered 2024-01-04 (×2): 1 mg via ORAL
  Filled 2024-01-04 (×2): qty 1

## 2024-01-04 NOTE — Plan of Care (Signed)

## 2024-01-04 NOTE — Progress Notes (Signed)
 [facility] Progress Note Patient: [name] [name] FAO:[id number] DOB: December 24, 1960 DOA: 12/06/2023  DOS: the patient was seen and examined on 01/04/2024  Brief Hospital Course: [name] [name] is a 62 y.o. male with a history of chronic HFrEF, VT on Eliquis , IV drug use, COPD, chronic HCV, MRSA bacteremia and endocarditis.  Recently admitted to [facility] for his MRSA bacteremia with endocarditis and per report, patient was uncooperative during the hospitalization with and was refusing workup/treatments; patient was given 1 dose of dalbavancin prior to discharge and presenting at [facility] for evaluation and management of his infection. ID, neurosurgery, cardiology, orthopedic surgery were consulted due to presentation with septic arthritis of the right hip as well as C2-C3 osteomyelitis with possible septic arthritis. patient is currently managed on vancomycin  IV. No surgical management for now recommended.  PICC line placed this admission for prolonged IV antibiotics. Threatened to leave AMA on 04/16 and 4/18.   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  Evaluated by neurosurgery -Dr. Nat Badger recommended medical management.  TEE deferred secondary to history of esophageal stricture.  TTE EF 20 to 25%.  Tricuspid valve thickening With possible mobile components seen on echocardiogram in outside facility on 11/27/2023. no vegetation mentioned on echo performed here. Orthopedic surgery consulted- "His imaging was reviewed by multiple joint replacement surgeons. 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 It's possible he's a candidate for some sort of pelvic reconstruction surgery but it's nothing anyone in this community does. Recommend transfer to tertiary  center for further consideration. " Patient has been reluctant to be transferred.  May not be a candidate given history of IVDU as well as cardiomyopathy as well. PICC placed on 3/24 for prolonged IV antibiotics. Not a candidate for home antibiotic given history of IVDU. Zyvox , Cipro, Bactrim appears to be an oral alternative.  Continue Vancomycin  IV; monitor vancomycin  levels Ideally will require an MRI C-spine and hip when nearing end of therapy.  Will also require ID reconsultation, when nearing end of therapy.  Chronic T12,L2, L3, L5 compression fractures Continue pain management   Chronic HFrEF Prolonged history.  Currently euvolemic. Cardiology recommending Aldactone , Coreg , low-dose losartan.  No SGLT2 inhibitor given risk of infection.   On Lasix .   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  Appears to be provoked event in the setting of right knee fracture in 2012.  Had IVC filter placed as well. IVC filter still in placed based on CT 11/2023. Has been on Eliquis  chronically. Last fill was 08/2023 for 90 days.   SIADH-hyponatremia Serum osm 275.  Urine osmolality 373. Urine sodium 71. Currently stable at around 129.  Asymptomatic.  Continue fluid restriction and Lasix .   Chronic hepatitis C infection HIV negative.  HBsAg nonreactive.  Hep B S AB Quant 3,959.0 (Immunity>10 mIU/mL) HEP B CORE AB Positive HCV RNA 11,800,000 ID recommending outpatient follow-up.   Polysubstance abuse H/o IVDU Recent UDS significant for amphetamines and cocaine. Patient counseled to stop. Patient will need PICC line removed prior to discharge from the hospital.   Tobacco use Noted.   Right inguinal hernia Noted on CT incidentally. No evidence of obstruction or incarceration.   Severe protein calorie malnutrition  Underweight Body mass index is 16.87 kg/m.  Dietitian following.  Continue supplements. Placing the patient at high risk of poor outcome.  Certainly adding to  his poor surgical candidacy.  Anemia of nutritional deficiency as well as chronic disease In the setting of malnutrition with osteomyelitis Continue supplement.  Hemoglobin stable.  Gout. Synovial fluid analysis along with growing MRSA also showed intracellular monosodium urate crystals. Will initiate colchicine .  No renal or hepatic on indication so far.  Pain management. Patient reports uncontrolled pain.  Yet His vital signs are stable.  Patient has chronic appearing compression fractures, chronic right hip pain based on the chart going back to 2019 and currently being treated for MRSA bacteremia with septic arthritis. Does have history of substance use disorder including history of IVDU. Wants to use Dilaudid  only for his pain control.  Will switch Oxy IR to Dilaudid .  Will lower the dose. Add scheduled Tylenol . Add as needed Robaxin . Patient told me that he is not interested in nonnarcotic medications for his pain control.  Depression. Known history. Multiple admissions with suicidal ideation in the past. Has been on Lexapro  20 mg daily. His hyponatremia does not appear to be related to the medication therefore we will resume this medication for now. Also on Vistaril  chronically which I will resume.  Compliance issues. Patient has history of leaving AMA multiple times without completing therapy in the hospital. Threatened to leave AMA again on 4/16 and on 4/18. Concern if he is compliant with his long-term medications for medical issues. Certainly places him at a high risk for poor outcome for major interventions and readmission. Not a candidate for off unit privileges off campus with PICC line.   Subjective: No nausea no vomiting.  Wants to switch his pain medication.  Physical Exam: S1-S2 present. Clear to auscultation. No edema.  Data Reviewed: I have Reviewed nursing notes, Vitals, and Lab results. Since last encounter, pertinent lab results CBC and BMP   .    Disposition: Status is: Inpatient Remains inpatient appropriate because: Monitor for completion of antibiotics apixaban  (ELIQUIS ) tablet 5 mg   Family Communication: No one at bedside Level of care: Med-Surg   Vitals:   01/03/24 2037 01/04/24 0410 01/04/24 0848 01/04/24 1701  BP: 117/71 131/74 134/85 119/76  Pulse: 80 80 73 94  Resp: 18   18  Temp: 98.7 F (37.1 C)     TempSrc: Oral     SpO2: 96% 97% 99% 97%  Weight:      Height:         Author: Charlean Congress, MD 01/04/2024 6:42 PM  Please look on [email or web address] to find out who is on call.

## 2024-01-05 DIAGNOSIS — B9562 Methicillin resistant Staphylococcus aureus infection as the cause of diseases classified elsewhere: Secondary | ICD-10-CM | POA: Diagnosis not present

## 2024-01-05 DIAGNOSIS — R7881 Bacteremia: Secondary | ICD-10-CM | POA: Diagnosis not present

## 2024-01-05 MED ORDER — LEVOFLOXACIN 750 MG PO TABS
750.0000 mg | ORAL_TABLET | Freq: Every day | ORAL | 0 refills | Status: AC
Start: 2024-01-05 — End: 2024-01-12

## 2024-01-05 NOTE — Progress Notes (Signed)
 Went to security to obtain patient's belongings held in security. The security officer found the belongings bag with only the debit card. I told the patient what happened and told him there would an investigation. I asked him for a phone number and he stated that he was homeless and did not have a phone. He stated that he just wanted to get out of here and not to call the number listed in his chart because that was his brother's phone number.  I notified the [facility], [place].

## 2024-01-05 NOTE — Progress Notes (Signed)
 Patient taken to entrance A , where he was picked up by [facility] , patient had all belonging and rolling walker.

## 2024-01-05 NOTE — Progress Notes (Signed)
 Patient signed AMA paperwork. Dr. Lydia Sams notified. PICC line removed.

## 2024-01-05 NOTE — Progress Notes (Signed)
 Patient refused vitals, medications and assessment. MD made aware

## 2024-01-05 NOTE — Discharge Summary (Signed)
 [facility] Discharge Summary   Patient: [name] [name] HQI:[id number]   PCP: Patient, No Pcp Per DOB: 1961/07/03   Date of admission: 12/06/2023   Date of discharge: 01/05/2024    Discharge Diagnoses:  Principal Problem:   MRSA bacteremia Active Problems:   Acute midline back pain   Compression of lumbar vertebra (HCC)   Chronic multifocal osteomyelitis of right femur (HCC)   Neck pain   Septic embolism (HCC)   Cachexia (HCC)   Homeless   IVDU (intravenous drug user)   Endocarditis of tricuspid valve   Nonintractable headache   Coronary artery disease involving native coronary artery of native heart   Chronic combined systolic and diastolic heart failure (HCC)   Ischemic cardiomyopathy   Chronic obstructive pulmonary disease (HCC)   Pulmonary emphysema (HCC)   Cigarette smoker   Compression fracture of body of thoracic vertebra (HCC)   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   In the ER, tachypneic with mild leukocytosis and lactic acidosis.  The patient does not want to return to previous facility and wants to be admitted at [facility].  He is willing to do further workup for his MRSA bacteremia.   The patient was started on IV vancomycin .  He received gentle IV fluid hydration 50 cc/h x 8 hours.  MRI  lumbar spine ordered and is pending.  Admitted by [facility], hospitalist service.  "  Hospital Course:  [name] is a 63 y.o. male with a history of chronic HFrEF, VT on Eliquis , IV drug use, COPD, chronic HCV, MRSA bacteremia and endocarditis.  Recently admitted to [facility] for his MRSA bacteremia with endocarditis and per report, patient was uncooperative during the hospitalization with and was refusing workup/treatments; patient was given 1 dose of dalbavancin prior to discharge and presenting at [facility] for evaluation and management of his infection. ID, neurosurgery, cardiology, orthopedic surgery were consulted due to presentation with septic arthritis of the right hip as well as C2-C3 osteomyelitis with possible septic arthritis. patient is currently managed on vancomycin  IV. No surgical management for now recommended.  PICC line placed this admission for prolonged IV antibiotics. Threatened to leave AMA on 04/16 and 4/18.    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  Evaluated by neurosurgery -Dr. Nat Badger recommended medical management.  TEE deferred secondary to history of esophageal stricture.  TTE EF 20 to 25%.  Tricuspid valve thickening With possible mobile components seen on echocardiogram in outside facility on 11/27/2023. no vegetation mentioned on echo performed here. Orthopedic surgery consulted- "His imaging was reviewed by multiple joint replacement surgeons. 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 It's possible he's a candidate for some sort  of pelvic reconstruction surgery but it's nothing anyone in this community does. Recommend transfer to tertiary center for further consideration. " Patient has been reluctant to be  transferred.  May not be a candidate given history of IVDU as well as cardiomyopathy as well. PICC placed on 3/24 for prolonged IV antibiotics. Not a candidate for home antibiotic given history of IVDU. Zyvox , Cipro, Bactrim appears to be an oral alternative.   Plan was to continue vancomycin  IV; monitor vancomycin  levels followed by an MRI C-spine and hip when nearing end of therapy.    Patient decided to leave AMA on 4/20.   Chronic T12,L2, L3, L5 compression fractures Patient was treated with pain management   Chronic HFrEF Prolonged history.  Currently euvolemic. Cardiology recommending Aldactone , Coreg , low-dose losartan.  No SGLT2 inhibitor given risk of infection.   On Lasix .   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  Appears to be provoked event in the setting of right knee fracture in 2012.  Had IVC filter placed as well. IVC filter still in placed based on CT 11/2023. Has been on Eliquis  chronically. Last fill was 08/2023 for 90 days.   SIADH-hyponatremia Sodium level corrected.   Chronic hepatitis C infection HIV negative.  HBsAg nonreactive.  Hep B S AB Quant 3,959.0 (Immunity>10 mIU/mL) HEP B CORE AB Positive HCV RNA 11,800,000 ID recommending outpatient follow-up.   Polysubstance abuse H/o IVDU Recent UDS significant for amphetamines and cocaine. Patient counseled to stop.   Tobacco use Noted.   Right inguinal hernia Noted on CT incidentally. No evidence of obstruction or incarceration.   Severe protein calorie malnutrition  Underweight Body mass index is 16.87 kg/m.  Dietitian was consulted.  Continue supplements. Placing the patient at high risk of poor outcome.  Certainly adding to his poor surgical candidacy.   Anemia of nutritional deficiency as well as chronic disease In the setting of malnutrition with osteomyelitis Continue supplement.  Hemoglobin stable.   Gout. Synovial fluid analysis along with growing MRSA also showed  intracellular monosodium urate crystals. Will initiate colchicine .  No renal or hepatic on indication so far.   Pain management. Patient reports uncontrolled pain.  Yet His vital signs are stable.  Patient has chronic appearing compression fractures, chronic right hip pain based on the chart going back to 2019 and currently being treated for MRSA bacteremia with septic arthritis. Does have history of substance use disorder including history of IVDU. Wants to use Dilaudid  only for his pain control.  Will switch Oxy IR to Dilaudid .  Will lower the dose. Add scheduled Tylenol . Add as needed Robaxin . Patient told me that he is not interested in nonnarcotic medications for his pain control.   Depression. Known history. Multiple admissions with suicidal ideation in the past. Has been on Lexapro  20 mg daily.  Currently no suicidal ideation.   Compliance issues. Patient has history of leaving AMA multiple times without completing therapy in the hospital. Threatened to leave AMA again on 4/16 and on 4/18.  Patient left AMA on 4/20.  1 week prescription was provided for antibiotic on discharge.  Patient was recommended to follow-up with [facility] orthopedics as he is intending to care for his septic arthritis.  Procedures and Results: IR consult for right acetabular fluid collection  Consultations: Cardiology ID Orthopedic Palliative care IR Wound care  The results of significant diagnostics from this hospitalization (including imaging, microbiology, ancillary and laboratory) are listed below for reference.    Significant Diagnostic Studies:  CT GUIDED PERITONEAL/RETROPERITONEAL FLUID DRAIN BY PERC CATH Result Date: 12/18/2023 INDICATION: 63 year old male with presumed right hip infection related to prosthetic hip EXAM: CT-GUIDED FLUID ASPIRATION TECHNIQUE: Multidetector CT imaging of the right hip was performed following the standard protocol without IV contrast. RADIATION DOSE  REDUCTION: This exam was performed according to the departmental dose-optimization program which includes automated exposure control, adjustment of the mA and/or kV according to patient size and/or use of iterative reconstruction technique. MEDICATIONS: None ANESTHESIA/SEDATION: Moderate (conscious) sedation was employed during this procedure. A total of Versed  1.5 mg and 0.5 mg IV Dilaudid  was administered intravenously by the radiology nurse. Total intra-service moderate Sedation Time: 17 minutes. The patient's level of consciousness and vital signs were monitored continuously by radiology nursing throughout the procedure under my direct supervision. COMPLICATIONS: None PROCEDURE: Informed written consent was obtained from the patient after a thorough discussion of the procedural risks, benefits and alternatives. All questions were addressed. Maximal Sterile Barrier Technique was utilized including caps, mask, sterile gowns, sterile gloves, sterile drape, hand hygiene and skin antiseptic. A timeout was performed prior to the initiation of the procedure. Patient was positioned left decubitus on the CT gantry table. Scout CT acquired for planning purposes. The patient is prepped and draped in the usual sterile fashion. 1% lidocaine  was used for local anesthesia. Using CT guidance, 18 gauge trocar needle was advanced from posterior approach targeting the T2 bright fluid just below the femoral component of the hip prostatic. This was evident on prior MR. The needle was advanced to the joint space and confirmed with CT. Approximately 2 cc of bloody complex fluid aspirated. We then advanced 11 gauge Murphy needle through the same poor and through the of into the space. This was confirmed with CT guidance. We were able to then aspirate approximately 10 cc of fluid. Samples were sent for culture. Patient tolerated the procedure well and remained hemodynamically stable throughout. No complications were encountered and no  significant blood loss IMPRESSION: Status post CT-guided right hip aspiration for presumed infected joint. Signed, Marciano Settles. Rexine Cater, RPVI [facility] Electronically Signed   By: Myrlene Asper D.O.   On: 12/18/2023 14:12   DG Pelvis 1-2 Views Result Date: 12/11/2023 CLINICAL DATA:  History of total hip arthroplasty. EXAM: PELVIS - 1-2 VIEW COMPARISON:  CT 12/06/2023 FINDINGS: Right hip arthroplasty. 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 The heterotopic calcification about the right hemipelvis is not as well demonstrated on the current exam. The acetabular thinning is better demonstrated on recent CT. Distal most femoral stem is not entirely included in the field of view. The bones are subjectively under mineralized IMPRESSION: Right hip arthroplasty with acetabular protrusion, similar to recent abdominal CT. Electronically Signed   By: Chadwick Colonel M.D.   On: 12/11/2023 16:37   MR HIP RIGHT WO CONTRAST Result Date: 12/10/2023 CLINICAL DATA:  Septic arthritis. 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 Acute on chronic right hip pain. MRSA bacteremia. Acute on chronic right hip pain. EXAM: MR OF THE RIGHT HIP WITHOUT CONTRAST TECHNIQUE: Multiplanar, multisequence MR imaging was performed. No intravenous contrast was administered. The technologist reports the study was limited due to patient's evaluate tolerate the exam protocol. Best images possible were obtained within the limitations of patient motion and metallic artifact. The patient refused to continue for postcontrast portion of the study. COMPARISON:  CT abdomen and pelvis 12/06/2023 FINDINGS: Despite efforts by the technologist and patient, mild to moderate motion  artifact is present on today's exam and could not be eliminated. This reduces exam sensitivity and specificity. Bones/joints/cartilage: There is metallic susceptibility artifact from total right hip  arthroplasty. 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 There is decreased T1 and increased T2 signal fluid inferior to the right femoral neck prosthesis, suggesting a complex joint effusion with mild-to-moderate joint capsule thickening. This fluid extends into the right obturator externus muscle, measuring up to 4.6 x 2.3 x 2.6 cm (transverse by AP by craniocaudal; axial series 6, image 41 and coronal series 10, image 9). There is also a thin connection of this fluid extending to mild fluid within the right iliopsoas bursa (axial series 6 images 13 through 33). Overall, this fluid appears to represent complex joint fluid extending into the right iliopsoas bursa. Within the limitations of metallic artifact, there also may be joint fluid extending inferior to the right femoral prosthetic head-neck junction (axial series 6, image 21 and coronal series 9, image 20). There also appears to be posterior extension of joint fluid (axial series 6, image 24) into a fluid collection wrapping around the posterior aspect of the right greater trochanter (axial series 6, image 23 and coronal series 10, image 28), measuring up to approximately 5.0 x 1.9 x 3.2 cm (transverse by AP by craniocaudal). There are mild septations within this fluid, and there is additional adjacent septated fluid extending more superiorly, into the right gluteus medius muscle (axial series 6, image 9 and coronal series 10, image 23) measuring up tor 4.7 x 2.3 x 2.0 cm (transverse by AP by craniocaudal. This is suspicious for infected joint fluid and developing abscesses. Muscles and tendons Muscles and tendons: Complex joint fluid extending into the right obturator externus muscle as above. Moderate right gluteus minimus and  medius muscle edema. Focal edema within the anterior aspect of the left gluteus medius muscle (axial series 6, image 1). Other findings Miscellaneous: There is metallic susceptibility artifact in the region of the posterior right hip/buttock (axial series 6, image 11 and coronal series 9, image 6). Recommend clinical correlation for possible surgical skin staples in this region. IMPRESSION: 1. Status post total right hip arthroplasty. 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 2. There is complex joint fluid inferior to the right femoral neck prosthesis with mild-to-moderate joint capsule thickening. This fluid extends into the right obturator externus muscle and into the right iliopsoas bursa. There also appears to be posterior extension of joint fluid into a fluid collection wrapping around the posterior aspect of the right greater trochanter and extending superiorly into the right gluteus medius muscle. This is suspicious for right humerus however septic arthritis with the infected joint fluid extending peripherally. 3. Moderate right gluteus minimus and medius muscle edema. Focal edema within the anterior aspect of the left gluteus medius muscle. 4. There is metallic susceptibility artifact in the region of the posterior right hip/buttock. Recommend clinical correlation for possible surgical skin staples in this region. Electronically Signed   By: Bertina Broccoli M.D.   On: 12/10/2023 14:35   MR CERVICAL SPINE WO CONTRAST Result Date: 12/10/2023 CLINICAL DATA:  Neck pain,  infection suspected, no prior imaging EXAM: MRI CERVICAL SPINE WITHOUT CONTRAST TECHNIQUE: Multiplanar, multisequence MR imaging of the cervical spine was performed. No intravenous contrast was administered. COMPARISON:  None Available.  FINDINGS: Severely motion limited study. Postcontrast imaging was performed due to patient intolerance. Within this limitation: Alignment: No substantial sagittal subluxation. Vertebrae: Limited evaluation due to motion. Significant edema surrounding the left C2-C3 facet joint and extending to involve the posterior left C2 and C3 vertebral bodies. Cord: Poorly evaluated due to motion. No obvious cord signal abnormality. Posterior Fossa, vertebral arteries, paraspinal tissues: Paraspinal edema surrounding the left C2-C3 facet joint. Disc levels: No significant canal stenosis. Nondiagnostic evaluation of the foramina due to severe motion on axial sequences. IMPRESSION: Severely motion limited noncontrast study. Significant marrow edema surrounding the left C2-C3 facet joint and extending to involve the posterior left C2 and C3 vertebral bodies with surrounding paraspinal edema, concerning for septic arthritis and possibly discitis/osteomyelitis given the reported clinical concern for infection. A repeat MRI of the cervical spine with contrast is recommended when the patient is able (possibly with sedation) to better evaluate. These results will be called to the ordering clinician or representative by the Radiologist Assistant, and communication documented in the PACS or [employer]. Electronically Signed   By: Stevenson Elbe M.D.   On: 12/10/2023 02:10   MR BRAIN WO CONTRAST Result Date: 12/10/2023 CLINICAL DATA:  Brain abscess EXAM: MRI HEAD WITHOUT CONTRAST TECHNIQUE: Multiplanar, multiecho pulse sequences of the brain and surrounding structures were obtained without intravenous contrast. COMPARISON:  None Available. FINDINGS: Brain: No acute infarction, hemorrhage, hydrocephalus, extra-axial collection or mass lesion. Vascular: Major arterial flow voids are maintained at the skull base. Skull and upper cervical spine: Normal marrow signal. Sinuses/Orbits: Clear sinuses.  No acute orbital findings. Other:  No mastoid effusions. IMPRESSION: No evidence of acute intracranial abnormality. Electronically Signed   By: Stevenson Elbe M.D.   On: 12/10/2023 02:03   ECHOCARDIOGRAM COMPLETE Result Date: 12/09/2023    ECHOCARDIOGRAM REPORT   Patient Name:   [name] [name] Date of Exam: 12/09/2023 Medical Rec #:  [id number]    Height:       73.0 in Accession #:    [id number]   Weight:       124.1 lb Date of Birth:  Feb 24, 1961    BSA:          1.757 m Patient Age:    63 years     BP:           123/81 mmHg Patient Gender: M            HR:           80 bpm. Exam Location:  Inpatient Procedure: 2D Echo, Cardiac Doppler, Color Doppler and Intracardiac            Opacification Agent (Both Spectral and Color Flow Doppler were            utilized during procedure). Indications:    Bacteremia  History:        Patient has no prior history of Echocardiogram examinations.                 CHF, COPD; Risk Factors:IVDA.  Sonographer:    Amy Chionchio Referring Phys: [id number] Dorma Gash IMPRESSIONS  1. Left ventricular ejection fraction, by estimation, is 20 to 25%. The left ventricle has severely decreased function. The left ventricle demonstrates global hypokinesis. The left ventricular internal cavity size was mildly dilated. Left ventricular diastolic  parameters are consistent with Grade I diastolic dysfunction (impaired relaxation). No LV thrombus noted.  2. Right ventricular systolic function is mildly reduced. The right ventricular size is normal. Tricuspid regurgitation signal is inadequate for assessing PA pressure.  3. The mitral valve is normal in structure. No evidence of mitral valve regurgitation. No evidence of mitral stenosis. Moderate mitral annular calcification.  4. The aortic valve was not well visualized. Aortic valve regurgitation is not visualized. No aortic stenosis is present.  5. IVC not visualized. FINDINGS  Left Ventricle: Left ventricular ejection fraction, by estimation, is 20 to 25%. The left ventricle has  severely decreased function. The left ventricle demonstrates global hypokinesis. Definity  contrast agent was given IV to delineate the left ventricular endocardial borders. The left ventricular internal cavity size was mildly dilated. There is no left ventricular hypertrophy. Left ventricular diastolic parameters are consistent with Grade I diastolic dysfunction (impaired relaxation). Right Ventricle: The right ventricular size is normal. No increase in right ventricular wall thickness. Right ventricular systolic function is mildly reduced. Tricuspid regurgitation signal is inadequate for assessing PA pressure. Left [facility]: Left atrial size was normal in size. Right [facility]: Right atrial size was normal in size. Pericardium: There is no evidence of pericardial effusion. Mitral Valve: The mitral valve is normal in structure. Moderate mitral annular calcification. No evidence of mitral valve regurgitation. No evidence of mitral valve stenosis. MV peak gradient, 2.4 mmHg. The mean mitral valve gradient is 1.0 mmHg. Tricuspid Valve: The tricuspid valve is normal in structure. Tricuspid valve regurgitation is not demonstrated. Aortic Valve: The aortic valve was not well visualized. Aortic valve regurgitation is not visualized. No aortic stenosis is present. Aortic valve mean gradient measures 3.0 mmHg. Aortic valve peak gradient measures 4.7 mmHg. Aortic valve area, by VTI measures 2.31 cm. Pulmonic Valve: The pulmonic valve was normal in structure. Pulmonic valve regurgitation is not visualized. Aorta: The aortic root is normal in size and structure. Venous: IVC not visualized. The inferior vena cava was not well visualized. IAS/Shunts: No atrial level shunt detected by color flow Doppler.  LEFT VENTRICLE PLAX 2D LVOT diam:     2.20 cm   Diastology LV SV:         33        LV e' medial:    5.00 cm/s LV SV Index:   19        LV E/e' medial:  9.1 LVOT Area:     3.80 cm  LV e' lateral:   5.87 cm/s                           LV E/e' lateral: 7.7  RIGHT VENTRICLE TAPSE (M-mode): 1.7 cm AORTIC VALVE AV Area (Vmax):    2.37 cm AV Area (Vmean):   2.15 cm AV Area (VTI):     2.31 cm AV Vmax:           108.00 cm/s AV Vmean:          78.600 cm/s AV VTI:            0.141 m AV Peak Grad:      4.7 mmHg AV Mean Grad:      3.0 mmHg LVOT Vmax:         67.20 cm/s LVOT Vmean:        44.500 cm/s LVOT VTI:          0.086 m LVOT/AV VTI ratio: 0.61 MITRAL VALVE MV Area (  PHT): 2.96 cm    SHUNTS MV Area VTI:   2.19 cm    Systemic VTI:  0.09 m MV Peak grad:  2.4 mmHg    Systemic Diam: 2.20 cm MV Mean grad:  1.0 mmHg MV Vmax:       0.78 m/s MV Vmean:      46.7 cm/s MV Decel Time: 256 msec MV E velocity: 45.40 cm/s MV A velocity: 67.70 cm/s MV E/A ratio:  0.67 Dalton McleanMD Electronically signed by Archer Bear Signature Date/Time: 12/09/2023/1:22:54 PM    Final    US  EKG SITE RITE Result Date: 12/09/2023 If Site Rite image not attached, placement could not be confirmed due to current cardiac rhythm.  DG Chest Port 1 View Result Date: 12/08/2023 CLINICAL DATA:  Shortness of breath. EXAM: PORTABLE CHEST 1 VIEW COMPARISON:  12/07/2023. FINDINGS: Patient is rotated. Trachea is midline. Heart size stable. Bibasilar streaky atelectasis. No airspace consolidation. There may be trace bilateral pleural effusions. IMPRESSION: 1. Trace bilateral pleural effusions. 2. Bibasilar streaky atelectasis. Electronically Signed   By: Shearon Denis M.D.   On: 12/08/2023 09:29   US  EKG SITE RITE Result Date: 12/08/2023 If [facility] image not attached, placement could not be confirmed due to current cardiac rhythm.  MR LUMBAR SPINE WO CONTRAST Result Date: 12/07/2023 CLINICAL DATA:  63 year old male with pain, age indeterminate lumbar compression fractures. EXAM: MRI LUMBAR SPINE WITHOUT CONTRAST TECHNIQUE: Multiplanar, multisequence MR imaging of the lumbar spine was performed. No intravenous contrast was administered. COMPARISON:  Lumbar spine CT last night.  FINDINGS: Segmentation:  Normal on the comparison. Alignment: Stable, normal lumbar lordosis. No significant scoliosis or spondylolisthesis. Vertebrae: Background bone marrow signal is normal and there are chronic compression fractures of T12, L2 (moderate), L3, and L5. Minimal retropulsion, such as at the L2 posterosuperior endplate. Superimposed mild degenerative appearing endplate marrow edema posteriorly at L2-L3. Intact visible sacrum and SI joints. No other acute osseous abnormality. Conus medullaris and cauda equina: Conus extends to the T12-L1 level. No lower spinal cord or conus signal abnormality. Normal cauda equina nerve roots. Capacious spinal canal. Paraspinal and other soft tissues: Susceptibility artifact anterior to the right sacrum related to pronounced right acetabular protrusion in the setting of hip arthroplasty, see CT Abdomen and Pelvis last night. Stable visible abdominal viscera. Mild symmetric and nonspecific bilateral erector spinae muscle STIR hyperintensity, such as on the left side series 4, image 19. No paraspinal fluid collection. Disc levels: Capacious spinal canal with no age advanced lumbar spine degeneration when considering the presence of multiple compression fractures. No lumbar spinal stenosis. There is mild bilateral L4 and left L5 degenerative neural foraminal stenosis. IMPRESSION: 1. Chronic compression fractures of T12, L2, L3, and L5 with no complicating features. 2. Subtle and nonspecific bilateral erector spinae muscle edema. A similar appearance can be seen in chronically debilitated patients. 3. 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 4. Capacious lumbar spinal canal with no spinal stenosis. Electronically Signed   By: Marlise Simpers M.D.   On: 12/07/2023 08:43   DG Chest 2 View Result Date: 12/07/2023 CLINICAL DATA:  Suspected Sepsis abdominal pain EXAM: CHEST  - 2 VIEW COMPARISON:  Chest x-ray 04/16/2022 FINDINGS: The heart and mediastinal contours are within normal limits. No focal consolidation. Chronic coarsened interstitial markings with no overt pulmonary edema. No pleural effusion. No pneumothorax. No acute osseous abnormality. Degenerative changes of the  shoulders. IMPRESSION: No active cardiopulmonary disease. Electronically Signed   By: Morgane  Naveau M.D.   On: 12/07/2023 00:32   CT L-SPINE NO CHARGE Result Date: 12/06/2023 CLINICAL DATA:  Acute nonlocalized abdominal pain EXAM: CT LUMBAR SPINE WITHOUT CONTRAST TECHNIQUE: Multidetector CT imaging of the lumbar spine was performed without intravenous contrast administration. Multiplanar CT image reconstructions were also generated. RADIATION DOSE REDUCTION: This exam was performed according to the departmental dose-optimization program which includes automated exposure control, adjustment of the mA and/or kV according to patient size and/or use of iterative reconstruction technique. COMPARISON:  CT abdomen and pelvis 05/04/2018 FINDINGS: Segmentation: 5 lumbar type vertebral bodies. Alignment: Normal alignment. Vertebrae: Compression of the L2 vertebra with about 40% loss of height. This is new since prior study. No retropulsion of fracture fragments. Compression of the superior endplate of L3 with about 20% loss of height. This is new since the prior study. No retropulsion of fracture fragments. Compression of the superior endplate of L5 is unchanged since prior study. No destructive or expansile bone lesions. Paraspinal and other soft tissues: See additional report of CT abdomen and pelvis same date. Disc levels: Intervertebral disc space heights are mostly preserved. Mild endplate osteophyte formation. IMPRESSION: Compression deformities of L2 and L3, new since prior study. Acute compression is not excluded. No retropulsion of fracture fragments. Electronically Signed   By: Boyce Byes M.D.   On:  12/06/2023 23:53   CT ABDOMEN PELVIS W CONTRAST Result Date: 12/06/2023 CLINICAL DATA:  Acute nonlocalized abdominal pain. EXAM: CT ABDOMEN AND PELVIS WITH CONTRAST TECHNIQUE: Multidetector CT imaging of the abdomen and pelvis was performed using the standard protocol following bolus administration of intravenous contrast. RADIATION DOSE REDUCTION: This exam was performed according to the departmental dose-optimization program which includes automated exposure control, adjustment of the mA and/or kV according to patient size and/or use of iterative reconstruction technique. CONTRAST:  75mL OMNIPAQUE  IOHEXOL  350 MG/ML SOLN COMPARISON:  05/04/2018 FINDINGS: Lower chest: Emphysematous changes in the lung bases. Bronchiectasis and bronchial wall thickening with evidence of mucous plugging likely chronic bronchitis. Trace bilateral pleural effusions with basilar atelectasis. Prominent coronary artery calcifications. Hepatobiliary: No focal liver abnormality is seen. No gallstones, gallbladder wall thickening, or biliary dilatation. Pancreas: Unremarkable. No pancreatic ductal dilatation or surrounding inflammatory changes. Spleen: Normal in size without focal abnormality. Adrenals/Urinary Tract: No adrenal gland nodules. Chronic scarring in the kidneys. Heterogeneous nephrograms bilaterally suggest evidence of acute pyelonephritis. No renal or ureteral obstruction. Visualized bladder is unremarkable. Portions of the bladder and pelvis are not visualized due to streak artifact from right hip arthroplasty. Stomach/Bowel: Stomach, small bowel, and colon are not abnormally distended. Stool fills the colon. There is a moderate-sized right inguinal hernia containing bowel but without obvious proximal obstruction. Visualization is limited due to the streak artifact. Vascular/Lymphatic: Calcification of the aorta. No aneurysm. Inferior vena caval filter. No significant lymphadenopathy. Reproductive: Prostate gland is mildly  enlarged. Other: Small amount of free fluid along the pericolic gutters is nonspecific. No free air. 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 This is progressing since the previous study. Heterotopic ossification. Compression deformities of T12, L2, L3, and L5 demonstrate progression since the previous study. Acute compression is not excluded. Degenerative changes in the spine. Old rib fractures. IMPRESSION: 1. Emphysematous changes and chronic bronchitic changes in the lung bases. Trace bilateral pleural effusions. 2. Heterogeneous nephrograms bilaterally suggesting acute pyelonephritis. No abscess or hydronephrosis. 3. Moderate-sized right inguinal hernia containing  bowel without obvious proximal obstruction. Visualization is limited due to streak artifact. 4. Aortic atherosclerosis.  Inferior vena caval filter. 5. Small amount of free fluid in the abdomen of nonspecific etiology. Electronically Signed   By: Boyce Byes M.D.   On: 12/06/2023 23:50    Microbiology: No results found for this or any previous visit (from the past 240 hours).   Labs: CBC: Recent Labs  Lab 01/01/24 0421  WBC 8.9  HGB 9.9*  HCT 29.8*  MCV 86.9  PLT 279   Basic Metabolic Panel: Recent Labs  Lab 01/01/24 0421 01/02/24 1006 01/03/24 0306 01/04/24 0340  NA 129* 128* 132* 132*  K 4.0 3.8 4.2 4.3  CL 95* 93* 97* 98  CO2 27 25 24 26   GLUCOSE 91 101* 95 94  BUN 25* 29* 28* 27*  CREATININE 0.88 0.89 0.90 0.94  CALCIUM  9.2 8.7* 9.0 9.1  MG  --  1.7 1.8 1.7   Liver Function Tests: No results for input(s): "AST", "ALT", "ALKPHOS", "BILITOT", "PROT", "ALBUMIN" in the last 168 hours. No results for input(s): "LIPASE", "AMYLASE" in the last 168 hours. No results for input(s): "AMMONIA" in the last 168 hours. Cardiac Enzymes: No results for input(s): "CKTOTAL", "CKMB", "CKMBINDEX", "TROPONINI" in the last 168 hours. BNP (last 3  results) Recent Labs    12/10/23 0449 12/11/23 0314 12/12/23 0330  BNP 154.1* 236.1* 178.0*   CBG: No results for input(s): "GLUCAP" in the last 168 hours. Time spent: 35 minutes  Signed:  Charlean Congress  [facility] 01/05/2024, 7:46 PM

## 2024-03-18 NOTE — Discharge Summary (Addendum)
 Hospitalist  Discharge Summary   Name: [name] [name] Age: 63 yrs  MRN: [id number] DOB: Jul 17, 1961  Admit Date: 02/26/2024 Admitting Physician: Charlie Czar, MD  Discharge Date: 03/18/2024 Discharge Physician: Trina JONETTA Downer, MD   Admission Diagnosis:   Sepsis    (CMD) [A41.9] Sepsis, due to unspecified organism, unspecified whether acute organ dysfunction present    (CMD) [A41.9]   Discharge Diagnoses:   Principal Problem (Resolved):   Sepsis    (CMD) Active Problems:   Polysubstance abuse (HCC)   History of pulmonary embolus (PE)   COPD (chronic obstructive pulmonary disease) (HCC)   Chronic systolic heart failure    (CMD)   TO DO List at Follow-up for PCP/Specialist:   Key Medication changes: Linezolid  for now and f/u with ID  Hospital Course:   For the details of admission, please see the H&P on 02/27/24.  For full details, please see progress notes, consult notes and ancillary notes.  The patient's hospital course will be summarized in a problem based approach below.    This is a pleasant 63 year old man with complex past medical history including MSSA bacteremia in 2023, MRSA bacteremia this year, recent diagnosis of C2-C3 osteomyelitis and discitis, has been previously diagnosed with chronic MRSA bacteremia and is currently on Bactrim, right hip PJI and intravesicular abscess, HFrEF, IV drug use, hep C, substance use disorder including amphetamines and cocaine, homelessness, COPD, PE on Eliquis  among other medical problems who presented the emergency department with chest pain. It is also reported by EMS the patient was found unresponsive to painful stimuli. His initial blood pressure was 61/33. Was having nausea and vomiting when he called EMS. He describes his chest pain as all over without lateralization. Otherwise denies shortness of breath. Initial vital signs in the ED revealed blood pressure of 84/57. He was also tachycardic at 119. Labs revealed initial lactic acid  of 5.3, WBC count of 10.5, hemoglobin 9.7, platelets 413, BMP with a creatinine of 1.49, bicarb 20, anion gap 17. Repeat lactic acid 3.9. Initial troponin 23. Chest x-ray without acute cardiopulmonary abnormality. In the emergency department he was treated with IV cefepime as well as IV vancomycin  on suspicion of sepsis due to unclear source. Blood cultures and urine cultures were sent. Patient was treated for suspected sepsis of unclear source, hypotension and encephalopathy.    He continued to receive iv Vancomycin . Lost IV acess,  transitioned vancomycin  to TMP-SMX 2 DS tablets PO twice daily to complete 3 week course from drain placement (03/06/24 - 03/27/24). On 03/28/24, transition to TMP-SMX 1 DS twice daily PO for indefinite suppression. Due to elevated Cr (0.86 to 1.29) ID recommended to change Bactrim to Linezolid  and f/u with them o/p. ID will take care of further continuation of antibiotic o/p from 03/28/24. He kept refusing for labs so repeat renal function could not be checked. He was well explained about importance of checking renal function. He was also evaluated by pain specialist as well. Case management offered two shelters to be  discharged to: [facility] and [facility] but he refused. He preferred to go to his family. Patient's symptoms continued to improve. Patient gradually reached to the baseline. All discharge instructions, medication instructions, and outpatient follow-up instructions were explained to the patient in detail and all questions were answered. Tolerating diet well. Stable on RA. As patient clinically improved, reached to baseline and remained hemodynamically stable patient was discharged home. Medications were arranged by CCM.   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  Lost IV acess, (Plan was transitioned Vancomycin  to TMP-SMX 2 DS tablets PO twice daily to complete 3 week course from drain placement (03/06/24 - 03/27/24) On 03/28/24, transition to TMP-SMX 1 DS twice daily PO for indefinite suppression) On 03/17/24 ID recommended Bactrim to change to Linezolid  and ID will f/u with patient for further continuation of antibiotic  Bactrim was changed to Linezolid  due to elevated Cr.    Sepsis: Right hip prosthetic joint infection/abscess CT head revealed interval increased multiloculated intramuscular fluid collection within the right iliacus muscle Status post CT-guided drain placement into the right hip abscess on 03/05/2024 Drain removed 6/25   IV drug use/substance use disorder-amphetamines and cocaine HIV non-reactive (2023), non-reactive 03/10/24 - HAV non-reactive (03/10/24) - HBsAg non-reactive,  Anti-HBc reactive, anti-HBs reactive (03/10/24) - HCV Ab reactive (03/10/24)HCV RNA     Chronic pain Eval by pain management on 6/23 Suboxone: Discontinued 6/26, as he has refused to f/u OP for ongoing management. Oxycodone  to 10 mg po q 4 hours PRN moderate/severe pain    HFrEF 25% Toprol -XL 25 mg daily Torsemide  resumed, not currently on aldactone    History of pulmonary embolism Apixaban  5 mg twice daily, was placed on hold for insertion of drain Resumed   Constipation aXR reassuring. Continue bowel regimen Mag citrate x 1 bottle today   COPD-stable  Discharge Condition:   Disposition: Patient discharged to Home or Self Care in good condition.  Diet at discharge: Ensure Plus; Three times daily with Meals Adult Diet- Regular  Activity at Discharge: Ambulate ad lib   Wound 10/27/23 Other (comment) Pretibial Left;Proximal (Active)  Date First Assessed/Time First Assessed:  10/27/23 1648   Pre-Existing Wound: Yes  Primary Wound Type: (c) Other (comment)  Location: Pretibial  Wound Location Orientation: Left;Proximal  Wound Description (Comments): Scab to pretibial, redness/swellin...     Wound 10/27/23 Neck Posterior;Right (Active)  Date First Assessed/Time First Assessed: 10/27/23 1650   Pre-Existing Wound: Yes  Location: Neck  Wound Location Orientation: Posterior;Right  Wound Description (Comments): small scab     Wound 11/26/23 Toe D5, fifth Anterior;Left (Active)  Date First Assessed/Time First Assessed: 11/26/23 1640   Pre-Existing Wound: No  Location: Toe D5, fifth  Wound Location Orientation: Anterior;Left  Wound Description (Comments): Skin Tear     Wound 01/11/24 Pressure Injury Back Medial (Active)  Date First Assessed/Time First Assessed: 01/11/24 1228   Pre-Existing Wound: Yes  Primary Wound Type: Pressure Injury  Location: Back  Wound Location Orientation: Medial     Wound 02/27/24 Back Medial (Active)  Date First Assessed/Time First Assessed: 02/27/24 0339   Location: Back  Wound Location Orientation: Medial     Physical Exam at Discharge   BP 114/64 (BP Location: Left arm, Patient Position: Lying)   Pulse 77   Temp 98.3 F (36.8 C) (Oral)   Resp 18   Ht 1.702 m (5' 7) Comment: pt states he's 6'4 on one leg, 5'7 on the other  Wt 56.2 kg (123 lb 14.4 oz)   SpO2 96%   BMI 19.41 kg/m   General: Awake and alert.      Eye: Normal conjunctiva. HENT: Normocephalic Neck: Supple Respiratory: Non labored respiration.   Cardiovascular:  Normal rate Gastrointestinal:  Not distended.  Integumentary: No rash   Neurologic:  Follows command.   Cognition and Speech: Alert and awake.   Psych: No suicidal or homicidal ideation  Discharge Medications:      Medication List     START taking these medications    cyclobenzaprine 5 mg tablet Commonly known as: FLEXERIL Take 1 tablet (5 mg total) by mouth 3 (three) times a day as needed for  muscle spasms.   lidocaine  4 % patch Commonly known as: SALONPAS Place 1 patch onto the skin daily. Patch may remain in place for up to 12 hours in a 24-hour period.  (Apply 1 patch topically daily.)   linezolid  600 mg tablet Commonly known as: ZYVOX  Take 1 tablet (600 mg total) by mouth 2 (two) times a day for 12 days.   oxyCODONE -acetaminophen  10-325 mg per tablet Commonly known as: PERCOCET Take 1 tablet by mouth every 6 hours as needed for moderate pain. (Take 1 tablet by mouth every 6 (six) hours as needed for moderate pain (4-6).)       CONTINUE taking these medications    apixaban  5 mg Tab Commonly known as: ELIQUIS  Take 1 tablet (5 mg total) by mouth 2 (two) times a day.   atorvastatin  80 mg tablet Commonly known as: LIPITOR  Take 1 tablet (80 mg total) by mouth at bedtime.   dicyclomine 10 mg capsule Commonly known as: BENTYL Take 1 capsule (10 mg total) by mouth 4 (four) times a day.   escitalopram  10 mg tablet Commonly known as: LEXAPRO  Take 1 tablet (10 mg total) by mouth daily.   gabapentin  100 mg capsule Commonly known as: NEURONTIN  Take 1 capsule (100 mg total) by mouth 2 (two) times a day.   magnesium  oxide 400 mg (241 mg magnesium ) Tab Take 1 tablet (400 mg total) by mouth daily.   metoprolol  succinate 25 mg 24 hr tablet Commonly known as: TOPROL  XL Take 1 tablet (25 mg total) by mouth daily.   naloxone  4 mg/actuation Spry nasal spray Commonly known as: NARCAN  For suspected opioid overdose, call 911 and give a single spray into ONE nostril. If no or minimal response after 3 minutes, give another spray in OTHER nostril. (Administer 1 spray into affected nostril(s) as needed (opioid-induced respiratory depression).)   torsemide  20 mg tablet Commonly known as: DEMADEX  Take one-half tablet by mouth daily. (Take 0.5 tablets (10 mg total) by mouth daily.)       STOP taking these medications    multivitamin with minerals   nicotine  14 mg/24 hr  patch Commonly known as: NICODERM CQ    spironolactone  25 mg tablet Commonly known as: ALDACTONE    sulfamethoxazole-trimethoprim 800-160 mg per tablet Commonly known as: BACTRIM DS         Where to Get Your Medications     These medications were sent to [facility] [address] [place] [id number]    Hours: Open Monday 12am to Friday 11:59pm; Sat-Sun: Closed; Holidays: Closed Thanksgiving Phone: [phone]  apixaban  5 mg Tab atorvastatin  80 mg tablet cyclobenzaprine 5 mg tablet dicyclomine 10 mg capsule escitalopram  10 mg tablet gabapentin  100 mg capsule lidocaine  4 % patch linezolid  600 mg tablet magnesium  oxide 400 mg (241 mg magnesium ) Tab metoprolol  succinate 25 mg 24 hr tablet naloxone  4 mg/actuation Spry nasal spray oxyCODONE -acetaminophen  10-325 mg per tablet torsemide  20 mg tablet     Significant Diagnostic Tests:   LABS:  Lab Results  Component Value  Date   WBC 7.70 03/17/2024   HGB 9.9 (L) 03/17/2024   HCT 29.0 (L) 03/17/2024   PLT 410 03/17/2024   CHOL 91 09/15/2023   TRIG 45 09/15/2023   HDL 48 (L) 09/15/2023   LDLDIRECT 33 11/14/2022   ALT 14 02/26/2024   AST 36 02/26/2024   NA 130 (L) 03/17/2024   K 4.9 03/17/2024   CL 93 (L) 03/17/2024   CREATININE 1.29 03/17/2024   BUN 41 (H) 03/17/2024   CO2 29 03/17/2024   TSH 0.494 01/11/2024   PTT 52.7 (H) 11/29/2023   INR 1.5 11/27/2023   HGBA1C 5.7 (H) 09/14/2023   IMAGING:  XR Chest 1 View  Final Result by Rankin Ina Slice, MD (06/11 2048)  XR CHEST 1 VIEW, 02/26/2024 8:28 PM    INDICATION:tachycardia, chest pain   COMPARISON: Chest radiograph 02/09/2024    FINDINGS:     Supportive devices: EKG leads overly the chest.   Cardiovascular/lungs/pleura: Similar cardiomediastinal silhouette. No   focal consolidation. No pleural effusion or pneumothorax.  Other: Polyarticular degenerative changes. No acute osseous abnormality.   IVC filter projects over the abdomen.     IMPRESSION:  No acute cardiopulmonary abnormality.         CULTURES:  No results found for this visit on 02/26/24 (from the past week).   Surgeries/Procedures:      Consults:   IP CONSULT TO INFECTIOUS DISEASES IP CONSULT TO INTERVENTIONAL RADIOLOGY IP CONSULT TO PAIN MANAGEMENT IP CONSULT TO INTERVENTIONAL RADIOLOGY  Follow-up Appointments:     Future Appointments  Date Time Provider Department Center  03/23/2024  1:00 PM Rocky Benton Das, MD [facility] ID [facility] [place]  03/23/2024  2:00 PM Lorane JAYSON Drape, PSS [facility] ID [facility] [place]     Side effects of all of the patient's current medications and interactions were d/w patient. Patient was in understanding of it. All of patient's questions were answered.      Explained to the patient about various etiologies for the symptoms.Patient was also informed about the ongoing work up and work up during stay. Patient is reassured well.   Virtual Information and Consent    Location Information: Patient State (at time of visit): [place]  Patient Location (at time of visit): [facility] Provider Location: [place] Is provider licensed to provide clinical care in the current location/state of the patient? Yes   Consent:  Patient's identity was confirmed. Presenting condition or illness was discussed with the patient/personal representative. Current proposed treatment for presenting condition or illness was explained to patient/personal representative along with the likely benefits and any significant risks or complications associated with the provision of treatment by audio/video means. The patient/personal representative verbally authorized treatment to be provided by audio/video, which may include a limited review of patient's current health status, medication, or other treatment recommendations, patient education, and an opportunity to ask questions about condition and treatment. Verbal Consent Granted by Patient/Personal  Representative:Yes    Visit Information: Modality: 2-Way Real-Time Audio/Video  Video Start Time: 0900 Video Stop Time: 0910 Video Total Time: 10   I have personally spent 31 minutes involved in face-to-face and non-face-to-face activities for this patient on the day of the visit.  Professional time spent includes the following activities, in addition to those noted in the documentation: Chart review, documentation, blood work/results review and care coordination.    Bedside RN was present and assisted with the visit.

## 2024-03-21 NOTE — ED Provider Notes (Signed)
 [facility] Emergency Department Physician Note Assumption of Care  Patient care assumed from previous provider.   Patient care of [name] [name] is a 63 y.o. male from previous provider.  Please see the original provider note from this emergency department encounter for full history and physical.   Medical History[1] Surgical History[2]  Course of Care and my assessment at the time of sign out is detailed in the ED Course below.   ED Course as of 03/23/24 1122  Fri Mar 20, 2024  2302 Overdose on gabapentin . 6+ hour obs period per poison control. Medically cleared. Ingestion occurred around 1:30pm  To do: Follow up psych recs [AF]  Sat Mar 21, 2024  0010 Droperidol midnight [AF]  0027 IVC OSH- psych updated due to cardiac complications and made aware [AF]  0417 Home meds reordered  [AF]  0700 S - gabapentin  OD, IVC to oSH (cardiac hx though), needed droperidol at midnight, home meds reordered [HG]  1555 Psychiatry reevaluated the patient and advised as needed hydroxyzine  for anxiety and holding Lexapro  in light of his linezolid .  These orders have been placed.  Plan is to continue IVC to OSH as patient is continuing to report SI. [HG]  1555 S- SI with overdose on gabapentin  and oxycodone  Now medically cleared IVC to OSH for ongoing SI 5 droperidol IM for agitation, unable to verbally deescalate  [ ]  f/u psych med recs  [GG]  1556 S, SI, drug overdose w/ gapapentin and oxy, IVC to OSH. Reporting SI. 5 droperidol last 2 shifts each.  [BS]  1710 Per psych: Holding Lexapro  in light of linezolid  use for concern for serotonin syndrome. - Begin hydroxyzine  25 mg three times daily as needed for anxiety  [GG]  2314 S. IVC to OSH SI with overdose [LB]  2314 S IVC OSH, SI, attempted od gabapentin , oxycodone . Meds given in AM  [PG]  Sun Mar 22, 2024  1509 S / IVC OSH for SI Attempt / Overdose on Gabapentin  and Oxycodone   [BB]  1510 S, IVC to OSH for SI w/ od gabapentin   and oxycodone .  [BS]  2313 S 63 y.o. IVC to OSH for SI [AF]  Mon Mar 23, 2024  0655 IVC OSH for suicide attempt [WL]  1121 Plan for 1SA pending bed availability [WL]    ED Course User Index [AF] Marsa Alm Hind, MD [BB] Briton Lurlean Dolly, MD [BS] Morene Lynwood Barrio, DO [GG] Laverda Karie Rimes, MD [HG] Sherrilyn Almarie Prude, MD [LB] Roderick Earnie Ravens, MD [PG] [address] [place], DO [WL] Elsie Lebron Coffee, MD     I personally reassessed the patient.  Patient with escalating behavior and violence against staff members as well as objects in room.  Patient tolerated droperidol overnight; additional dose of droperidol ordered as patient was unwilling to take p.o.  Psychiatry reevaluated the patient and advised as needed hydroxyzine  for anxiety and holding Lexapro  in light of his linezolid .  These orders have been placed.  Plan is to continue IVC to OSH as patient is continuing to report SI.  1. Intentional overdose, initial encounter    (CMD)      HANDOFF: This patient's work-up was incomplete and items were still pending at the time that I departed the emergency department.  The history, physical and clinical course in the ED were discussed with oncoming team for further workup. Please see their note for documentation of the continuation of patient's care and their disposition.   ED Disposition  None       The plan for this patient was discussed with Dr. Norleen Cammie Mango, MD who voiced agreement and who oversaw evaluation and treatment of this patient.   Electronically signed by:  Sherrilyn Almarie Prude, MD 03/21/2024 10:33 AM       [1] Past Medical History: Diagnosis Date  . Alcohol use disorder, severe, dependence (HCC) 06/05/2018  . Cardiomyopathy (HCC) 05/05/2020   Formatting of this note might be different from the original.  Presumed alcoholic however no ischemic workup documented from chart review   8/21.       SABRA Chronic  hepatitis C virus infection (HCC) 11/28/2017  . Chronic systolic heart failure    (CMD) 04/13/2022  . Closed wedge compression fracture of T8 vertebra (HCC) 11/21/2020  . Cocaine use disorder, moderate, dependence (HCC) 02/03/2017  . Compression fracture of L2 vertebra (HCC) 03/07/2020  . COPD (chronic obstructive pulmonary disease) (HCC) 09/13/2018  . GERD (gastroesophageal reflux disease) 09/13/2018  . History of lower GI bleeding 06/16/2015  . History of pulmonary embolus (PE) 04/13/2022  . 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 IVDU (intravenous drug user) 11/27/2017  . MDD (major depressive disorder), recurrent severe, without psychosis    (CMD) 08/28/2023  . MRSA bacteremia 11/28/2023  . Polysubstance abuse (HCC) 06/16/2015  . Tobacco use disorder 06/16/2015  [2] No past surgical history on file.

## 2024-03-22 NOTE — ED Provider Notes (Signed)
 ED Attending:   EM Observation Re-evaluation note  [name] [name] is a 63 y.o. male, seen on rounds today.  The patient presented with a chief complaint of SI with overdose on gabapentin  and oxycodone  Now medically cleared.   Currently, the patient is resting comfortably.  The patient is currently in observation status to await disposition.    Temp:  [96.7 F (35.9 C)-98.7 F (37.1 C)] 96.7 F (35.9 C) Heart Rate:  [65-91] 77 Resp:  [15-27] 15 BP: (120-152)/(70-95) 142/79 SpO2:  [96 %-100 %] 100 %  non labored breathing regular rate and rhythm extremities normal, atraumatic, no cyanosis or edema Psychiatric examination: Calm, cooperative  MDM:  I have reviewed the labs performed to date as well as medications administered while in observation.   Current plan is for IVC for OSH.  Patient is under full IVC at this time.

## 2024-03-23 NOTE — ED Provider Notes (Signed)
 EM Observation Re-evaluation note  [name] [name] is a 63 y.o. male, seen on rounds today.  The patient presented with a chief complaint of SI .   Temp:  [97.8 F (36.6 C)-98.4 F (36.9 C)] 97.8 F (36.6 C) Heart Rate:  [76-91] 76 Resp:  [14-26] 20 BP: (120-149)/(82-98) 147/97 SpO2:  [97 %-100 %] 97 %  EXAM:  Lungs: No respiratory distress.  CV: Good perfusion.  Psych: no complaints this morning, cooperative  MDM:  I have reviewed the labs performed to date as well as medications administered while in observation. Recent changes in the last 24 hours include no acute events.  Current plan is for IVC for OSH.  Patient is under full IVC at this time.

## 2024-03-29 ENCOUNTER — Encounter (HOSPITAL_COMMUNITY): Payer: Self-pay

## 2024-03-29 ENCOUNTER — Other Ambulatory Visit: Payer: Self-pay

## 2024-03-29 ENCOUNTER — Emergency Department (HOSPITAL_COMMUNITY)
Admission: EM | Admit: 2024-03-29 | Discharge: 2024-03-30 | Disposition: A | Discharge status: Home / Self Care | Payer: MEDICAID | Attending: Emergency Medicine | Admitting: Emergency Medicine

## 2024-03-29 DIAGNOSIS — Z7901 Long term (current) use of anticoagulants: Secondary | ICD-10-CM | POA: Insufficient documentation

## 2024-03-29 DIAGNOSIS — J449 Chronic obstructive pulmonary disease, unspecified: Secondary | ICD-10-CM | POA: Insufficient documentation

## 2024-03-29 DIAGNOSIS — Z5900 Homelessness unspecified: Secondary | ICD-10-CM | POA: Insufficient documentation

## 2024-03-29 DIAGNOSIS — D649 Anemia, unspecified: Secondary | ICD-10-CM | POA: Insufficient documentation

## 2024-03-29 DIAGNOSIS — M25551 Pain in right hip: Secondary | ICD-10-CM | POA: Insufficient documentation

## 2024-03-29 LAB — URINALYSIS, W/ REFLEX TO CULTURE (INFECTION SUSPECTED)
Bacteria, UA: NONE SEEN
Bilirubin Urine: NEGATIVE
Glucose, UA: NEGATIVE mg/dL
Hgb urine dipstick: NEGATIVE
Ketones, ur: NEGATIVE mg/dL
Leukocytes,Ua: NEGATIVE
Nitrite: NEGATIVE
Protein, ur: NEGATIVE mg/dL
Specific Gravity, Urine: 1.018 (ref 1.005–1.030)
pH: 5 (ref 5.0–8.0)

## 2024-03-29 LAB — CBC WITH DIFFERENTIAL/PLATELET
Abs Immature Granulocytes: 0.02 K/uL (ref 0.00–0.07)
Basophils Absolute: 0 K/uL (ref 0.0–0.1)
Basophils Relative: 0 %
Eosinophils Absolute: 0.6 K/uL — ABNORMAL HIGH (ref 0.0–0.5)
Eosinophils Relative: 9 %
HCT: 24.8 % — ABNORMAL LOW (ref 39.0–52.0)
Hemoglobin: 7.8 g/dL — ABNORMAL LOW (ref 13.0–17.0)
Immature Granulocytes: 0 %
Lymphocytes Relative: 10 %
Lymphs Abs: 0.7 K/uL (ref 0.7–4.0)
MCH: 26.4 pg (ref 26.0–34.0)
MCHC: 31.5 g/dL (ref 30.0–36.0)
MCV: 83.8 fL (ref 80.0–100.0)
Monocytes Absolute: 0.7 K/uL (ref 0.1–1.0)
Monocytes Relative: 10 %
Neutro Abs: 4.8 K/uL (ref 1.7–7.7)
Neutrophils Relative %: 71 %
Platelets: 168 K/uL (ref 150–400)
RBC: 2.96 MIL/uL — ABNORMAL LOW (ref 4.22–5.81)
RDW: 18.3 % — ABNORMAL HIGH (ref 11.5–15.5)
WBC: 6.8 K/uL (ref 4.0–10.5)
nRBC: 0 % (ref 0.0–0.2)

## 2024-03-29 LAB — RAPID URINE DRUG SCREEN, HOSP PERFORMED
Amphetamines: NOT DETECTED
Barbiturates: NOT DETECTED
Benzodiazepines: NOT DETECTED
Cocaine: NOT DETECTED
Opiates: NOT DETECTED
Tetrahydrocannabinol: POSITIVE — AB

## 2024-03-29 LAB — SEDIMENTATION RATE: Sed Rate: 80 mm/h — ABNORMAL HIGH (ref 0–16)

## 2024-03-29 LAB — I-STAT CG4 LACTIC ACID, ED: Lactic Acid, Venous: 1.8 mmol/L (ref 0.5–1.9)

## 2024-03-29 NOTE — ED Provider Notes (Signed)
 [place] EMERGENCY DEPARTMENT AT [facility] Provider Note   CSN: [id number] Arrival date & time: 03/29/24  1225     Patient presents with: Leg Pain   [name] [name] is a 63 y.o. male.    Leg Pain    64 year old man with complex past medical history including MSSA bacteremia in 2023, MRSA bacteremia this year, recent diagnosis of C2-C3 osteomyelitis and discitis, has been previously diagnosed with chronic MRSA bacteremia and is currently on Bactrim, right hip PJI and intravesicular abscess, HFrEF, IV drug use, hep C, substance use disorder including amphetamines and cocaine, homelessness, COPD, PE on Eliquis  who presents to the emergency department with chronic right hip pain.  The patient states that he has had chronic problems with his hip.  On chart review, he has had extensive presentations to the emergency department and inpatient hospitalizations at an outside hospital.  I was able to review those records.  His most recent hospitalization was from 02/26/2024 to 03/18/2024 during which time he was diagnosed with sepsis and bacteremia of unclear etiology.  He was discharged on oral antibiotics.  He was supposed to follow-up for ID for continued management.  It was thought that he had a right hip prosthetic joint infection/abscess.  He also had a multiloculated intramuscular fluid collection within the right iliac us  muscle and had CT-guided drain placement into the right hip on 03/05/2024, removal of drain occurred on 03/11/2024.  He states that paramedics were called out today to check on him and recommended he come to the emergency department for evaluation.  He has been endorsing chills.  He endorses continued ongoing pain in his right hip.  He also states that he has developed lesions along the left side of his rib cage with significant swelling.   Prior to Admission medications   Medication Sig Start Date End Date Taking? Authorizing Provider  atorvastatin  (LIPITOR ) 80 MG tablet  Take 80 mg by mouth at bedtime. 07/18/23   [provider]  bisacodyl  (DULCOLAX) 10 MG suppository Place rectally. 11/23/23   [provider]  Docusate Sodium  (DSS) 100 MG CAPS Take 1 capsule by mouth daily as needed. 11/23/23   [provider]  ELIQUIS  5 MG TABS tablet Take 5 mg by mouth 2 (two) times daily. 08/21/23   [provider]  escitalopram  (LEXAPRO ) 20 MG tablet Take 20 mg by mouth daily. 08/21/23   [provider]  famotidine  (PEPCID ) 20 MG tablet Take 10 mg by mouth 2 (two) times daily. 08/21/23   [provider]  hydrOXYzine  (ATARAX ) 25 MG tablet Take 25 mg by mouth 3 (three) times daily. 11/23/23   [provider]  metoprolol  succinate (TOPROL -XL) 25 MG 24 hr tablet Take 25 mg by mouth daily. 11/23/23   [provider]  Multiple Vitamin (QUINTABS) TABS Take 1 tablet by mouth daily. 09/17/18   [provider]  naloxone  (NARCAN ) nasal spray 4 mg/0.1 mL Place 0.4 mg into the nose once. 09/03/23   [provider]  polyethylene glycol powder (GLYCOLAX /MIRALAX ) 17 GM/SCOOP powder Take 17 g by mouth 2 (two) times daily as needed for mild constipation. 11/23/23   [provider]  prochlorperazine  (COMPAZINE ) 5 MG tablet Take 5 mg by mouth 2 (two) times daily as needed for nausea or vomiting. 11/23/23   [provider]  senna (SENOKOT) 8.6 MG TABS tablet Take 2 tablets by mouth daily as needed for mild constipation.    [provider]  spironolactone  (ALDACTONE ) 25 MG  tablet Take 25 mg by mouth daily. 09/18/23   [provider]  torsemide  (DEMADEX ) 20 MG tablet Take 1 tablet by mouth daily. 09/09/23   [provider]  traMADol  (ULTRAM ) 50 MG tablet Take 50 mg by mouth every 6 (six) hours as needed. 11/23/23   [provider]    Allergies: Fentanyl , Hydromorphone  hcl, Morphine, Pantoprazole, Pantoprazole sodium, Haloperidol , Nsaids, Morphine and codeine, Ibuprofen,  Ketorolac, and Pantoprazole sodium    Review of Systems  All other systems reviewed and are negative.   Updated Vital Signs BP 112/69 (BP Location: Left Arm)   Pulse 95   Temp 98 F (36.7 C) (Oral)   Resp 19   Ht 6' 1 (1.854 m)   Wt 53.5 kg   SpO2 98%   BMI 15.57 kg/m   Physical Exam Vitals and nursing note reviewed.  Constitutional:      General: He is not in acute distress.    Appearance: He is well-developed.  HENT:     Head: Normocephalic and atraumatic.  Eyes:     Conjunctiva/sclera: Conjunctivae normal.  Cardiovascular:     Rate and Rhythm: Normal rate and regular rhythm.     Heart sounds: No murmur heard. Pulmonary:     Effort: Pulmonary effort is normal. No respiratory distress.     Breath sounds: Normal breath sounds.  Chest:     Comments: Mass lesion along lower left ribcage Abdominal:     Palpations: Abdomen is soft.     Tenderness: There is no abdominal tenderness.  Musculoskeletal:        General: No swelling.     Cervical back: Neck supple.     Comments: Pain and TTP of the right hip, pain with ROM  Skin:    General: Skin is warm and dry.     Capillary Refill: Capillary refill takes less than 2 seconds.  Neurological:     Mental Status: He is alert.  Psychiatric:        Mood and Affect: Mood normal.     (all labs ordered are listed, but only abnormal results are displayed) Labs Reviewed  CBC WITH DIFFERENTIAL/PLATELET - Abnormal; Notable for the following components:      Result Value   RBC 2.96 (*)    Hemoglobin 7.8 (*)    HCT 24.8 (*)    RDW 18.3 (*)    Eosinophils Absolute 0.6 (*)    All other components within normal limits  SEDIMENTATION RATE - Abnormal; Notable for the following components:   Sed Rate 80 (*)    All other components within normal limits  CULTURE, BLOOD (ROUTINE X 2)  CULTURE, BLOOD (ROUTINE X 2)  URINALYSIS, W/ REFLEX TO CULTURE (INFECTION SUSPECTED)  RAPID URINE DRUG SCREEN, HOSP PERFORMED  COMPREHENSIVE  METABOLIC PANEL WITH GFR  C-REACTIVE PROTEIN  I-STAT CG4 LACTIC ACID, ED  I-STAT CG4 LACTIC ACID, ED    EKG: None  Radiology: No results found.   Procedures   Medications Ordered in the ED - No data to display                                  Medical Decision Making Amount and/or Complexity of Data Reviewed Labs: ordered. Radiology: ordered.    63 year old man with complex past medical history including MSSA bacteremia in 2023, MRSA bacteremia this year, recent diagnosis of C2-C3 osteomyelitis and discitis, has been previously diagnosed with chronic MRSA  bacteremia and is currently on Bactrim, right hip PJI and intravesicular abscess, HFrEF, IV drug use, hep C, substance use disorder including amphetamines and cocaine, homelessness, COPD, PE on Eliquis  who presents to the emergency department with chronic right hip pain.  The patient states that he has had chronic problems with his hip.  On chart review, he has had extensive presentations to the emergency department and inpatient hospitalizations at an outside hospital.  I was able to review those records.  His most recent hospitalization was from 02/26/2024 to 03/18/2024 during which time he was diagnosed with sepsis and bacteremia of unclear etiology.  He was discharged on oral antibiotics.  He was supposed to follow-up for ID for continued management.  It was thought that he had a right hip prosthetic joint infection/abscess.  He also had a multiloculated intramuscular fluid collection within the right iliac us  muscle and had CT-guided drain placement into the right hip on 03/05/2024, removal of drain occurred on 03/11/2024.  He states that paramedics were called out today to check on him and recommended he come to the emergency department for evaluation.  He has been endorsing chills.  He endorses continued ongoing pain in his right hip.  He also states that he has developed lesions along the left side of his rib cage with significant  swelling.   On, the patient was afebrile, borderline elevated heart rate 99, RR 18, BP 121/65, saturating her percent on room air.  Physical exam significant for tenderness to palpation of the right hip with pain with range of motion. Pt with reported new masslike lesion along ribcage on the left. In the setting of the patient's previous infectious etiology within his hip, we will obtain laboratory evaluation and imaging evaluation.  Labs: CBC without a leukocytosis, mild anemia to 7.8, 1.5 points down from the patient's baseline of around 9.  Lactic acid normal, ESR elevated at 80, urinalysis negative for UTI, CMP pending, blood cultures collected and pending.  CT chest abdomen pelvis ordered, pending at time of signout.  Plan at time of signout to follow-up results of CT imaging, ultimate disposition pending results of imaging evaluation and reassessment. Signout given to Dr. Garrick at 2100.      Final diagnoses:  Right hip pain    ED Discharge Orders     None          Jerrol Agent, MD 03/29/24 2021

## 2024-03-29 NOTE — ED Provider Notes (Signed)
 9:28 PM Care of the patient assumed at signout.  Now patient is awake, alert, requesting discharge, states that he wants no additional studies done.  Initial labs reviewed, notable for no leukocytosis, no lactic acidosis, he is afebrile, awake and alert.  Request for discharge accommodated.   Garrick Charleston, MD 03/29/24 2129

## 2024-03-29 NOTE — ED Triage Notes (Signed)
 Pt has plates in right upper leg. Hx of MRSA in leg. C/O pain in right upper leg. C/O mass to left flank. Denies fevers.

## 2024-03-29 NOTE — Progress Notes (Signed)
 CSW added shelter resources to patient's AVS.  Edwin Dada, MSW, LCSW Transitions of Care  Clinical Social Worker II [phone]

## 2024-03-29 NOTE — ED Notes (Signed)
 Consulted with phlebotomist regarding labs.

## 2024-03-29 NOTE — Discharge Instructions (Signed)
 With your medical history is important to follow-up with your primary care physician tomorrow.  Do not hesitate to return here for concerning changes in your condition.

## 2024-03-29 NOTE — ED Notes (Signed)
 Unable to collect labs after multiple sticks by Medic and Phlebotomists.

## 2024-03-30 NOTE — Progress Notes (Addendum)
 CSW received consult for possible SNF placement.  CSW will be unable to place patient in a SNF due to a history of substance abuse and homelessness. Shelter resources are on patient's AVS.  CSW spoke with RN who agrees to discharge patient to [facility] @ [address] [place] via cab.  Niels Portugal, MSW, LCSW Transitions of Care  Clinical Social Worker II [phone]
